# Patient Record
Sex: Male | Born: 1965 | Race: White | Hispanic: No | Marital: Married | Smoking: Never smoker
Health system: Southern US, Community
[De-identification: ages and names within clinical notes are randomized; demographics above are authoritative.]

## PROBLEM LIST (undated history)

## (undated) DIAGNOSIS — E119 Type 2 diabetes mellitus without complications: Secondary | ICD-10-CM

## (undated) DIAGNOSIS — E785 Hyperlipidemia, unspecified: Secondary | ICD-10-CM

## (undated) DIAGNOSIS — K760 Fatty (change of) liver, not elsewhere classified: Secondary | ICD-10-CM

## (undated) DIAGNOSIS — D649 Anemia, unspecified: Secondary | ICD-10-CM

## (undated) DIAGNOSIS — K922 Gastrointestinal hemorrhage, unspecified: Secondary | ICD-10-CM

## (undated) DIAGNOSIS — K7460 Unspecified cirrhosis of liver: Secondary | ICD-10-CM

## (undated) DIAGNOSIS — K219 Gastro-esophageal reflux disease without esophagitis: Secondary | ICD-10-CM

## (undated) DIAGNOSIS — M1990 Unspecified osteoarthritis, unspecified site: Secondary | ICD-10-CM

## (undated) DIAGNOSIS — R0600 Dyspnea, unspecified: Secondary | ICD-10-CM

## (undated) DIAGNOSIS — I10 Essential (primary) hypertension: Secondary | ICD-10-CM

## (undated) DIAGNOSIS — Z5189 Encounter for other specified aftercare: Secondary | ICD-10-CM

## (undated) HISTORY — DX: Unspecified osteoarthritis, unspecified site: M19.90

## (undated) HISTORY — DX: Encounter for other specified aftercare: Z51.89

## (undated) HISTORY — PX: OTHER SURGICAL HISTORY: SHX169

## (undated) HISTORY — DX: Hyperlipidemia, unspecified: E78.5

## (undated) HISTORY — DX: Gastro-esophageal reflux disease without esophagitis: K21.9

## (undated) HISTORY — PX: UPPER GASTROINTESTINAL ENDOSCOPY: SHX188

## (undated) HISTORY — PX: COLONOSCOPY: SHX174

## (undated) HISTORY — DX: Anemia, unspecified: D64.9

---

## 2009-06-25 ENCOUNTER — Emergency Department (HOSPITAL_COMMUNITY): Admission: EM | Admit: 2009-06-25 | Discharge: 2009-06-25 | Payer: Self-pay | Admitting: Emergency Medicine

## 2009-06-25 IMAGING — CR DG CHEST 2V
2 series · 2 of 2 positions shown · non-contrast
Comparison: None

CLINICAL DATA: Short of breath, hypertension, diabetes

CHEST - 2 VIEW

[w chest pa]
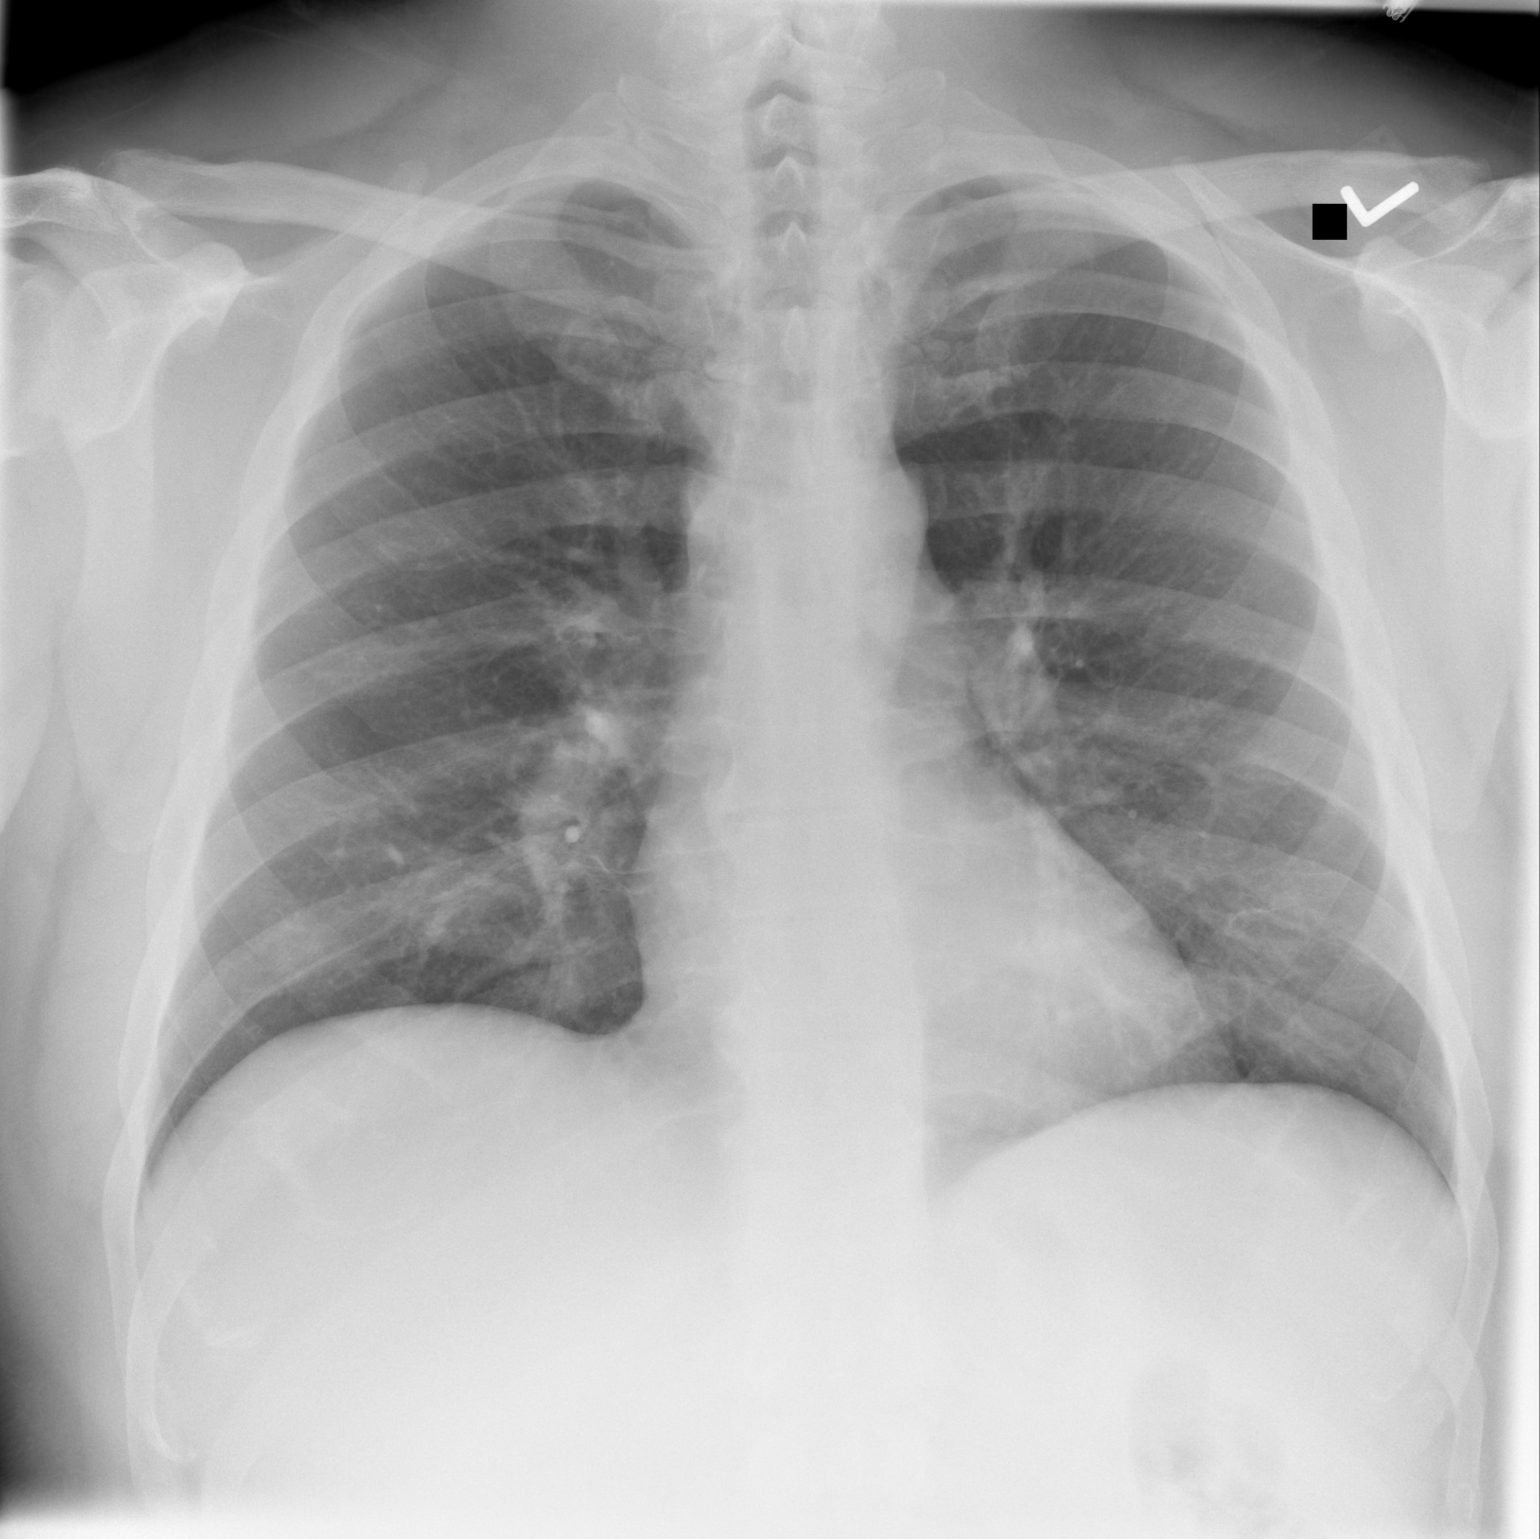

[w chest lat *]
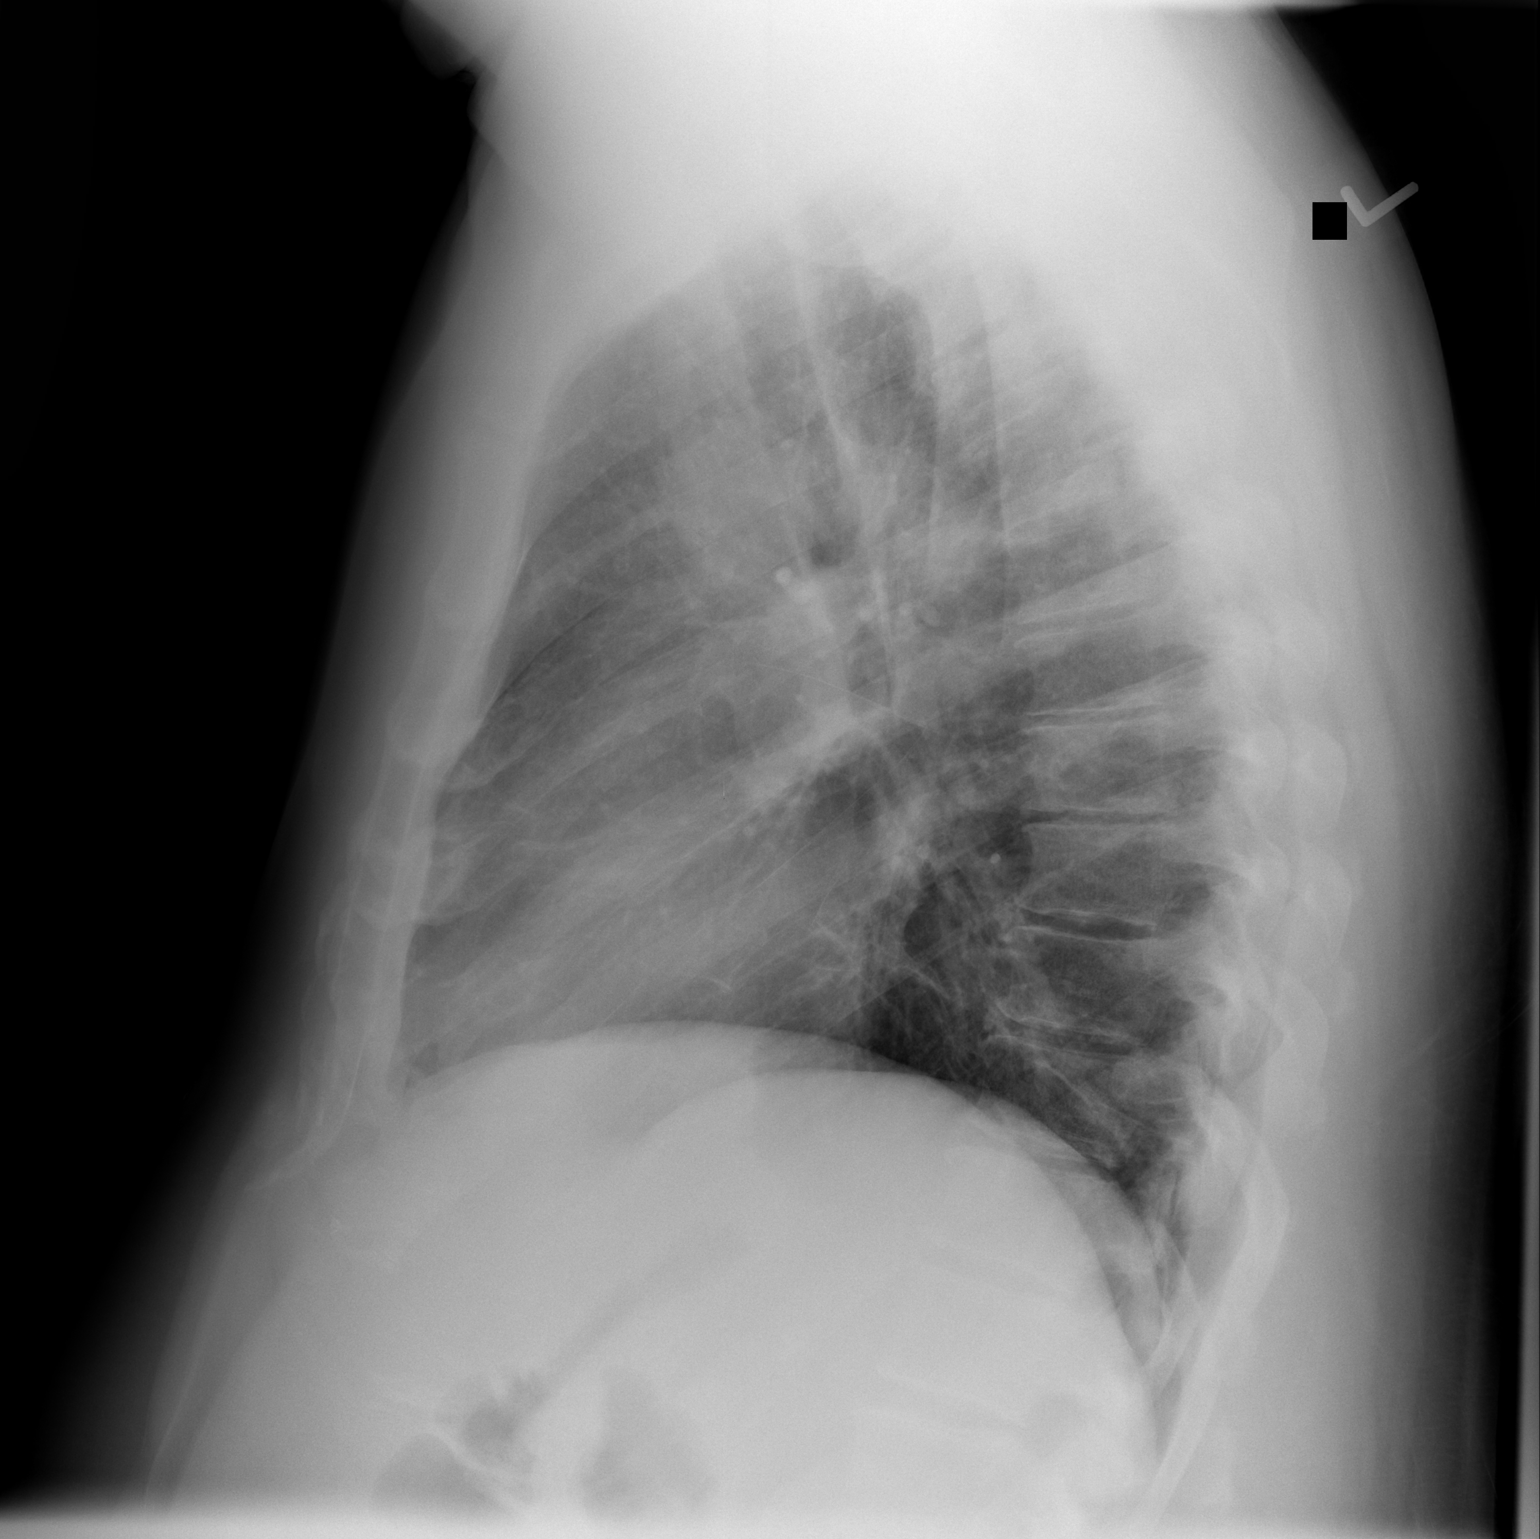

[2 of 2 positions shown; findings below may reference images not displayed]

FINDINGS: No active infiltrate or effusion is seen.  There is mild
peribronchial thickening present. The heart is within normal limits
in size. No bony abnormality is seen.
IMPRESSION: No active lung disease. Mild peribronchial thickening.

## 2017-09-14 DIAGNOSIS — D6959 Other secondary thrombocytopenia: Secondary | ICD-10-CM | POA: Diagnosis not present

## 2017-09-14 DIAGNOSIS — K7460 Unspecified cirrhosis of liver: Secondary | ICD-10-CM | POA: Diagnosis not present

## 2017-09-14 DIAGNOSIS — D732 Chronic congestive splenomegaly: Secondary | ICD-10-CM | POA: Diagnosis not present

## 2018-10-06 ENCOUNTER — Other Ambulatory Visit: Payer: Self-pay

## 2018-10-06 ENCOUNTER — Encounter (HOSPITAL_COMMUNITY): Payer: Self-pay | Admitting: Emergency Medicine

## 2018-10-06 ENCOUNTER — Inpatient Hospital Stay (HOSPITAL_COMMUNITY)
Admission: EM | Admit: 2018-10-06 | Discharge: 2018-10-10 | DRG: 432 | Disposition: A | Discharge status: Home / Self Care | Payer: BLUE CROSS/BLUE SHIELD | Attending: Internal Medicine | Admitting: Internal Medicine

## 2018-10-06 DIAGNOSIS — K7581 Nonalcoholic steatohepatitis (NASH): Secondary | ICD-10-CM | POA: Diagnosis present

## 2018-10-06 DIAGNOSIS — I8511 Secondary esophageal varices with bleeding: Secondary | ICD-10-CM | POA: Diagnosis present

## 2018-10-06 DIAGNOSIS — D72829 Elevated white blood cell count, unspecified: Secondary | ICD-10-CM | POA: Diagnosis present

## 2018-10-06 DIAGNOSIS — I10 Essential (primary) hypertension: Secondary | ICD-10-CM | POA: Diagnosis present

## 2018-10-06 DIAGNOSIS — E669 Obesity, unspecified: Secondary | ICD-10-CM | POA: Diagnosis present

## 2018-10-06 DIAGNOSIS — K3189 Other diseases of stomach and duodenum: Secondary | ICD-10-CM | POA: Diagnosis present

## 2018-10-06 DIAGNOSIS — K766 Portal hypertension: Secondary | ICD-10-CM | POA: Diagnosis present

## 2018-10-06 DIAGNOSIS — D6959 Other secondary thrombocytopenia: Secondary | ICD-10-CM | POA: Diagnosis present

## 2018-10-06 DIAGNOSIS — Z79899 Other long term (current) drug therapy: Secondary | ICD-10-CM | POA: Diagnosis not present

## 2018-10-06 DIAGNOSIS — D62 Acute posthemorrhagic anemia: Secondary | ICD-10-CM | POA: Diagnosis present

## 2018-10-06 DIAGNOSIS — I8500 Esophageal varices without bleeding: Secondary | ICD-10-CM

## 2018-10-06 DIAGNOSIS — Z7984 Long term (current) use of oral hypoglycemic drugs: Secondary | ICD-10-CM

## 2018-10-06 DIAGNOSIS — K7460 Unspecified cirrhosis of liver: Secondary | ICD-10-CM | POA: Diagnosis present

## 2018-10-06 DIAGNOSIS — Z833 Family history of diabetes mellitus: Secondary | ICD-10-CM | POA: Diagnosis not present

## 2018-10-06 DIAGNOSIS — D649 Anemia, unspecified: Secondary | ICD-10-CM

## 2018-10-06 DIAGNOSIS — E119 Type 2 diabetes mellitus without complications: Secondary | ICD-10-CM

## 2018-10-06 DIAGNOSIS — Z6833 Body mass index (BMI) 33.0-33.9, adult: Secondary | ICD-10-CM | POA: Diagnosis not present

## 2018-10-06 DIAGNOSIS — K921 Melena: Secondary | ICD-10-CM | POA: Diagnosis present

## 2018-10-06 DIAGNOSIS — K922 Gastrointestinal hemorrhage, unspecified: Secondary | ICD-10-CM | POA: Diagnosis present

## 2018-10-06 HISTORY — DX: Fatty (change of) liver, not elsewhere classified: K76.0

## 2018-10-06 HISTORY — DX: Unspecified cirrhosis of liver: K74.60

## 2018-10-06 HISTORY — DX: Essential (primary) hypertension: I10

## 2018-10-06 HISTORY — DX: Type 2 diabetes mellitus without complications: E11.9

## 2018-10-06 HISTORY — DX: Gastrointestinal hemorrhage, unspecified: K92.2

## 2018-10-06 LAB — COMPREHENSIVE METABOLIC PANEL
ALT: 45 U/L — ABNORMAL HIGH (ref 0–44)
AST: 42 U/L — ABNORMAL HIGH (ref 15–41)
Albumin: 3.7 g/dL (ref 3.5–5.0)
Alkaline Phosphatase: 54 U/L (ref 38–126)
Anion gap: 7 (ref 5–15)
BUN: 76 mg/dL — ABNORMAL HIGH (ref 6–20)
CO2: 23 mmol/L (ref 22–32)
Calcium: 9.3 mg/dL (ref 8.9–10.3)
Chloride: 105 mmol/L (ref 98–111)
Creatinine, Ser: 1.01 mg/dL (ref 0.61–1.24)
GFR calc Af Amer: >60 mL/min (ref >60)
GFR calc non Af Amer: >60 mL/min (ref >60)
Glucose, Bld: 180 mg/dL — ABNORMAL HIGH (ref 70–99)
Potassium: 5.2 mmol/L — ABNORMAL HIGH (ref 3.5–5.1)
Sodium: 135 mmol/L (ref 135–145)
Total Bilirubin: 1.4 mg/dL — ABNORMAL HIGH (ref 0.3–1.2)
Total Protein: 6.9 g/dL (ref 6.5–8.1)

## 2018-10-06 LAB — CBC
HCT: 32.1 % — ABNORMAL LOW (ref 39.0–52.0)
Hemoglobin: 10.9 g/dL — ABNORMAL LOW (ref 13.0–17.0)
MCH: 32.8 pg (ref 26.0–34.0)
MCHC: 34 g/dL (ref 30.0–36.0)
MCV: 96.7 fL (ref 80.0–100.0)
Platelets: 127 10*3/uL — ABNORMAL LOW (ref 150–400)
RBC: 3.32 MIL/uL — ABNORMAL LOW (ref 4.22–5.81)
RDW: 14 % (ref 11.5–15.5)
WBC: 11.5 10*3/uL — ABNORMAL HIGH (ref 4.0–10.5)
nRBC: 0 % (ref 0.0–0.2)

## 2018-10-06 LAB — TYPE AND SCREEN
ABO/RH(D): A POS
Antibody Screen: NEGATIVE

## 2018-10-06 LAB — GLUCOSE, CAPILLARY: Glucose-Capillary: 160 mg/dL — ABNORMAL HIGH (ref 70–99)

## 2018-10-06 LAB — ABO/RH: ABO/RH(D): A POS

## 2018-10-06 LAB — POC OCCULT BLOOD, ED: Fecal Occult Bld: POSITIVE — AB

## 2018-10-06 MED ORDER — SODIUM CHLORIDE 0.9 % IV BOLUS
1000.0000 mL | Freq: Once | INTRAVENOUS | Status: AC
  Administered 2018-10-06: 1000 mL via INTRAVENOUS

## 2018-10-06 MED ORDER — SODIUM CHLORIDE 0.9 % IV SOLN
INTRAVENOUS | Status: AC
  Administered 2018-10-07: via INTRAVENOUS

## 2018-10-06 MED ORDER — SODIUM CHLORIDE 0.9 % IV SOLN
8.0000 mg/h | INTRAVENOUS | Status: DC
  Administered 2018-10-06 – 2018-10-08 (×7): 8 mg/h via INTRAVENOUS
  Filled 2018-10-06 (×8): qty 80

## 2018-10-06 MED ORDER — SODIUM CHLORIDE 0.9 % IV SOLN
25.0000 ug/h | INTRAVENOUS | Status: DC
  Administered 2018-10-07 – 2018-10-08 (×4): 25 ug/h via INTRAVENOUS
  Filled 2018-10-06 (×5): qty 1

## 2018-10-06 MED ORDER — CIPROFLOXACIN IN D5W 400 MG/200ML IV SOLN
400.0000 mg | Freq: Two times a day (BID) | INTRAVENOUS | Status: DC
  Filled 2018-10-06: qty 200

## 2018-10-06 MED ORDER — MORPHINE SULFATE (PF) 2 MG/ML IV SOLN: 1.0000 mg | Freq: Four times a day (QID) | INTRAVENOUS | Status: DC | PRN

## 2018-10-06 MED ORDER — INSULIN ASPART 100 UNIT/ML ~~LOC~~ SOLN
0.0000 [IU] | SUBCUTANEOUS | Status: DC
  Administered 2018-10-06: 2 [IU] via SUBCUTANEOUS
  Administered 2018-10-07: 3 [IU] via SUBCUTANEOUS
  Administered 2018-10-07: 2 [IU] via SUBCUTANEOUS
  Administered 2018-10-08: 1 [IU] via SUBCUTANEOUS
  Administered 2018-10-08 (×2): 2 [IU] via SUBCUTANEOUS
  Administered 2018-10-08: 3 [IU] via SUBCUTANEOUS
  Administered 2018-10-08 – 2018-10-09 (×2): 1 [IU] via SUBCUTANEOUS
  Administered 2018-10-09: 2 [IU] via SUBCUTANEOUS
  Administered 2018-10-09: 1 [IU] via SUBCUTANEOUS
  Administered 2018-10-09: 5 [IU] via SUBCUTANEOUS
  Administered 2018-10-09 – 2018-10-10 (×3): 2 [IU] via SUBCUTANEOUS
  Administered 2018-10-10: 1 [IU] via SUBCUTANEOUS

## 2018-10-06 MED ORDER — ONDANSETRON HCL 4 MG/2ML IJ SOLN
4.0000 mg | Freq: Four times a day (QID) | INTRAMUSCULAR | Status: DC | PRN
  Administered 2018-10-07: 4 mg via INTRAVENOUS

## 2018-10-06 MED ORDER — ACETAMINOPHEN 650 MG RE SUPP: 650.0000 mg | Freq: Four times a day (QID) | RECTAL | Status: DC | PRN

## 2018-10-06 MED ORDER — ONDANSETRON HCL 4 MG PO TABS: 4.0000 mg | ORAL_TABLET | Freq: Four times a day (QID) | ORAL | Status: DC | PRN

## 2018-10-06 MED ORDER — ACETAMINOPHEN 325 MG PO TABS: 650.0000 mg | ORAL_TABLET | Freq: Four times a day (QID) | ORAL | Status: DC | PRN

## 2018-10-06 MED ORDER — SODIUM CHLORIDE 0.9 % IV SOLN
80.0000 mg | Freq: Once | INTRAVENOUS | Status: AC
  Administered 2018-10-06: 80 mg via INTRAVENOUS
  Filled 2018-10-06: qty 80

## 2018-10-06 MED ORDER — SODIUM CHLORIDE 0.9% FLUSH
3.0000 mL | Freq: Two times a day (BID) | INTRAVENOUS | Status: DC
  Administered 2018-10-06 – 2018-10-10 (×4): 3 mL via INTRAVENOUS

## 2018-10-06 MED ORDER — CIPROFLOXACIN IN D5W 400 MG/200ML IV SOLN
400.0000 mg | Freq: Once | INTRAVENOUS | Status: AC
  Administered 2018-10-06: 400 mg via INTRAVENOUS
  Filled 2018-10-06: qty 200

## 2018-10-06 MED ORDER — OCTREOTIDE LOAD VIA INFUSION
25.0000 ug | Freq: Once | INTRAVENOUS | Status: AC
  Administered 2018-10-07: 25 ug via INTRAVENOUS
  Filled 2018-10-06: qty 13

## 2018-10-06 NOTE — ED Triage Notes (Signed)
Started vomitng and dark stools started yesterday evening, has hx of GI Bleed, does not have a reg dr here.

## 2018-10-06 NOTE — ED Provider Notes (Signed)
[facility] EMERGENCY DEPARTMENT Provider Note   CSN: [id number] Arrival date & time: 10/06/18  1407     History   Chief Complaint Chief Complaint  Patient presents with  . GI Bleeding    HPI Martin Strong is a 52 y.o. male. 52 year old male, with PMH of upper GI bleeds, esophageal varices, diabetes, liver cirrhosis, presents with a 1 day history of hematemesis.  Patient states symptoms started suddenly yesterday with nausea and vomiting.  He noted approximately 1 cup of blood in his vomit and vomited 5 times yesterday.  He also states he has had black stools today.  He also states he is feeling lightheaded today.  He denies any associated abdominal pain, diarrhea.  He has not followed up with a GI doctor in a year and a half.  He previously received his GI care in [place].  He denies being on any blood thinners.  Patient denies any alcohol intake.  The history is provided by the patient.    Past Medical History:  Diagnosis Date  . Cirrhosis ([place])   . Diabetes mellitus without complication ([place])   . Fatty liver   . GI bleed   . Hypertension     There are no active problems to display for this patient.   Past Surgical History:  Procedure Laterality Date  . esophageal bands          Home Medications    Prior to Admission medications   Not on File    Family History No family history on file.  Social History Social History   Tobacco Use  . Smoking status: Never Smoker  . Smokeless tobacco: Never Used  Substance Use Topics  . Alcohol use: Never    Frequency: Never  . Drug use: Never     Allergies   Patient has no allergy information on record.   Review of Systems Review of Systems  Constitutional: Negative for chills and fever.  HENT: Negative for rhinorrhea and sore throat.   Eyes: Negative for visual disturbance.  Respiratory: Negative for cough and shortness of breath.   Cardiovascular: Negative for chest pain and leg  swelling.  Gastrointestinal: Positive for blood in stool (black stool), nausea and vomiting. Negative for abdominal pain and diarrhea.  Genitourinary: Negative for dysuria, frequency and urgency.  Musculoskeletal: Negative for joint swelling and neck pain.  Skin: Negative for rash and wound.  Neurological: Positive for light-headedness. Negative for syncope and numbness.  All other systems reviewed and are negative.    Physical Exam Updated Vital Signs BP 107/72 (BP Location: Left Arm)   Pulse (!) 125   Temp 98.9 F (37.2 C) (Oral)   Resp 18   Ht 6' (1.829 m)   Wt 110.7 kg   SpO2 100%   BMI 33.09 kg/m   Physical Exam  Constitutional: He is oriented to person, place, and time. He appears well-developed and well-nourished.  HENT:  Head: Normocephalic and atraumatic.  Eyes: Conjunctivae and EOM are normal.  Neck: Neck supple.  Cardiovascular: Regular rhythm and normal heart sounds. Tachycardia present.  No murmur heard. Pulmonary/Chest: Effort normal and breath sounds normal. No respiratory distress. He has no wheezes. He has no rales.  Abdominal: Soft. Bowel sounds are normal. He exhibits no distension. There is no tenderness.  Genitourinary: Rectum normal.  Genitourinary Comments: Black tarry stool.  Chaperone present during exam  Musculoskeletal: Normal range of motion. He exhibits no tenderness or deformity.  Neurological: He is alert and oriented  to person, place, and time.  Skin: Skin is warm and dry. No rash noted. No erythema.  Psychiatric: He has a normal mood and affect. His behavior is normal.  Nursing note and vitals reviewed.    ED Treatments / Results  Labs (all labs ordered are listed, but only abnormal results are displayed) Labs Reviewed  COMPREHENSIVE METABOLIC PANEL - Abnormal; Notable for the following components:      Result Value   Potassium 5.2 (*)    Glucose, Bld 180 (*)    BUN 76 (*)    AST 42 (*)    ALT 45 (*)    Total Bilirubin 1.4 (*)     All other components within normal limits  CBC - Abnormal; Notable for the following components:   WBC 11.5 (*)    RBC 3.32 (*)    Hemoglobin 10.9 (*)    HCT 32.1 (*)    Platelets 127 (*)    All other components within normal limits  POC OCCULT BLOOD, ED  TYPE AND SCREEN  ABO/RH    EKG EKG Interpretation  Date/Time:  Thursday October 06 2018 14:54:56 EDT Ventricular Rate:  129 PR Interval:  146 QRS Duration: 82 QT Interval:  302 QTC Calculation: 442 R Axis:   26 Text Interpretation:  Sinus tachycardia Possible Anterior infarct , age undetermined T wave abnormality, consider inferior ischemia Abnormal ECG Since last tracing rate faster Confirmed by Isla Pence [phone]) on 10/06/2018 5:51:36 PM   Radiology No results found.  Procedures .Critical Care Performed by: Etter Sjogren, PA-C Authorized by: Etter Sjogren, PA-C   Critical care provider statement:    Critical care time (minutes):  45   Critical care was necessary to treat or prevent imminent or life-threatening deterioration of the following conditions:  Circulatory failure, dehydration and shock   Critical care was time spent personally by me on the following activities:  Discussions with consultants, evaluation of patient's response to treatment, examination of patient, ordering and performing treatments and interventions, ordering and review of laboratory studies, ordering and review of radiographic studies, pulse oximetry, re-evaluation of patient's condition, obtaining history from patient or surrogate and review of old charts   (including critical care time)  Medications Ordered in ED Medications  pantoprazole (PROTONIX) 80 mg in sodium chloride 0.9 % 100 mL IVPB (has no administration in time range)  pantoprazole (PROTONIX) 80 mg in sodium chloride 0.9 % 250 mL (0.32 mg/mL) infusion (has no administration in time range)  sodium chloride 0.9 % bolus 1,000 mL (has no administration in time range)       Initial Impression / Assessment and Plan / ED Course  I have reviewed the triage vital signs and the nursing notes.  Pertinent labs & imaging results that were available during my care of the patient were reviewed by me and considered in my medical decision making (see chart for details).     Patient tachycardic, otherwise vital signs stable.  Patient no acute distress, resting comfortably in bed.  Hemoglobin is low at 10.9 BUN is elevated.  Patient has black stool on physical exam.  History and physical most consistent with upper GI bleed.  Will consult GI.  Spoke with Dr. Lyndel Safe with GI.  He recommends admit to the hospitalist, n.p.o. except for ice chips.  Recommends Cipro and octreotide.  His plan is for an EGD in the morning and thus there is an acute change in EGD is earlier.  Re-evaluated patient multiple times, patient remains  tachycardic.  However he is resting comfortably in bed, blood pressure is stable.  No hematemesis while back in his room.   Spoke with Dr. Myna Hidalgo who will see and admit the pt.  Critical care was performed on this patient due to patient being critically ill and at risk for life-threatening endorgan damage.  Frequent re-evaluations, monitor vital signs and cardiac monitor, called consultants, reviewed EMR. Final Clinical Impressions(s) / ED Diagnoses   Final diagnoses:  None    ED Discharge Orders    None       Etter Sjogren, PA-C 10/07/18 0156    Isla Pence, MD 10/08/18 1050

## 2018-10-06 NOTE — H&P (Addendum)
History and Physical    Martin Strong VFI:[id number] DOB: 12/21/1965 DOA: 10/06/2018  PCP: No primary care provider on file.   Patient coming from: Home   Chief Complaint: Melena, hematemesis  HPI: Martin Strong is a 52 y.o. 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  Patient reports that he noted black tarry stools yesterday evening and later had an episode of hematemesis.  He had another episode of hematemesis this morning and began to feel lightheaded.  He denies any abdominal pain, fevers, chills, chest pain, shortness breath, or cough.  He reports history of upper GI bleed 3 years ago, states that he underwent upper endoscopy at that time with banding of esophageal varices.  He went on to follow with GI and had repeat endoscopies, but not in the past year or so.  ED Course: Upon arrival to the ED, patient is found to be afebrile, saturating low 90s on room air, tachycardic in the 120s, and with stable blood pressure.  EKG features sinus tachycardia with rate 129.  Chemistry panel is notable for potassium of 5.2 and BUN of 76.  There is slight elevation in transaminases and bilirubin.  CBC is notable for mild leukocytosis, slight thrombocytopenia, and normocytic anemia with hemoglobin 10.9.  Fecal occult blood testing was positive.  Gastroenterology was consulted by the ED physician and recommended medical admission with the patient to be n.p.o., and also recommending octreotide and ciprofloxacin in addition to the PPI infusion.  Type and screen was performed, a liter of normal saline was given, and the patient will be admitted to the stepdown unit for ongoing evaluation and management.  Review of Systems:  All other systems reviewed and apart from HPI, are negative.  Past Medical History:  Diagnosis  Date  . Cirrhosis ([place])   . Diabetes mellitus without complication ([place])   . Fatty liver   . GI bleed   . Hypertension     Past Surgical History:  Procedure Laterality Date  . esophageal bands       reports that he has never smoked. He has never used smokeless tobacco. He reports that he does not drink alcohol or use drugs.  Allergies  Allergen Reactions  . Nadolol Nausea Only    Family History  Problem Relation Age of Onset  . Diabetes Other      Prior to Admission medications   Medication Sig Start Date End Date Taking? Authorizing Provider  cholecalciferol (VITAMIN D) 1000 units tablet Take 1,000 Units by mouth 2 (two) times daily.   Yes [provider]  Ferrous Sulfate (IRON) 325 (65 Fe) MG TABS Take 325 mg by mouth 2 (two) times daily.   Yes [provider]  FLAX OIL-FISH OIL-BORAGE OIL PO Take 1 tablet by mouth daily.   Yes [provider]  glipiZIDE (GLUCOTROL) 10 MG tablet Take 10 mg by mouth 2 (two) times daily.   Yes [provider]  lisinopril (PRINIVIL,ZESTRIL) 20 MG tablet Take 20 mg by mouth 2 (two) times daily.   Yes [provider]  metFORMIN (GLUCOPHAGE) 1000 MG tablet Take 1,000 mg by mouth 2 (two) times daily with a meal.   Yes [provider]  PROPRANOLOL HCL PO Take 1 tablet by mouth 2 (two) times daily.   Yes [provider]    Physical Exam: Vitals:   10/06/18  1602 10/06/18 1800 10/06/18 1815 10/06/18 1845  BP: 107/72 111/81 112/69 128/69  Pulse: (!) 125 (!) 112 (!) 128 (!) 124  Resp: 18 13 19 14   Temp: 98.9 F (37.2 C)     TempSrc: Oral     SpO2: 100% 100% 99% 99%  Weight:      Height:        Constitutional: NAD, calm  Eyes: PERTLA, lids and conjunctivae normal ENMT: Mucous membranes are moist. Posterior pharynx clear of any exudate or lesions.   Neck: normal, supple, no masses, no thyromegaly Respiratory: clear to auscultation bilaterally, no wheezing, no crackles. Normal  respiratory effort.   Cardiovascular: Rate ~120 and regular. No extremity edema.   Abdomen: Soft. Nontender. Bowel sounds active.  Musculoskeletal: no clubbing / cyanosis. No joint deformity upper and lower extremities.   Skin: no significant rashes, lesions, ulcers. Warm, dry, well-perfused. Neurologic: No facial asymmetry. Sensation intact. Moving all extremities.  Psychiatric: Alert and oriented x 3. Calm, cooperative.    Labs on Admission: I have personally reviewed following labs and imaging studies  CBC: Recent Labs  Lab 10/06/18 1517  WBC 11.5*  HGB 10.9*  HCT 32.1*  MCV 96.7  PLT 388*   Basic Metabolic Panel: Recent Labs  Lab 10/06/18 1517  NA 135  K 5.2*  CL 105  CO2 23  GLUCOSE 180*  BUN 76*  CREATININE 1.01  CALCIUM 9.3   GFR: Estimated Creatinine Clearance: 112.4 mL/min (by C-G formula based on SCr of 1.01 mg/dL). Liver Function Tests: Recent Labs  Lab 10/06/18 1517  AST 42*  ALT 45*  ALKPHOS 54  BILITOT 1.4*  PROT 6.9  ALBUMIN 3.7   No results for input(s): LIPASE, AMYLASE in the last 168 hours. No results for input(s): AMMONIA in the last 168 hours. Coagulation Profile: No results for input(s): INR, PROTIME in the last 168 hours. Cardiac Enzymes: No results for input(s): CKTOTAL, CKMB, CKMBINDEX, TROPONINI in the last 168 hours. BNP (last 3 results) No results for input(s): PROBNP in the last 8760 hours. HbA1C: No results for input(s): HGBA1C in the last 72 hours. CBG: No results for input(s): GLUCAP in the last 168 hours. Lipid Profile: No results for input(s): CHOL, HDL, LDLCALC, TRIG, CHOLHDL, LDLDIRECT in the last 72 hours. Thyroid Function Tests: No results for input(s): TSH, T4TOTAL, FREET4, T3FREE, THYROIDAB in the last 72 hours. Anemia Panel: No results for input(s): VITAMINB12, FOLATE, FERRITIN, TIBC, IRON, RETICCTPCT in the last 72 hours. Urine analysis: No results found for: COLORURINE, APPEARANCEUR, LABSPEC, PHURINE,  GLUCOSEU, HGBUR, BILIRUBINUR, KETONESUR, PROTEINUR, UROBILINOGEN, NITRITE, LEUKOCYTESUR Sepsis Labs: @LABRCNTIP (procalcitonin:4,lacticidven:4) )No results found for this or any previous visit (from the past 240 hour(s)).   Radiological Exams on Admission: No results found.  EKG: Independently reviewed. Sinus tachycardia (rate 129).   Assessment/Plan   1. Acute upper GI bleed; hx of cirrhosis with varices  - Pt reports hx of non-alcoholic cirrhosis with esophageal varices that were banded ~3 yrs ago, now p/w melena, hematemesis, and lightheadedness without syncope  - He is tachycardic in ED with normal BP and Hgb of 10.9 (no priors available)  - GI consulting and much appreciated  - Type and screen, continue NPO, fluid-resuscitate, continue IV Protonix and octreotide infusions, ppx ciprofloxacin, trend H&H    2. Hypertension - BP normal  - Antihypertensives held in light of acute UGIB    3. Type II DM  - No A1c on file  - Managed at home with glipizide and metformin, held  on admission  - Check CBG's and use a low-intensity SSI with Novolog     DVT prophylaxis: SCD's  Code Status: Full   Family Communication: Family updated at bedside  Consults called: GI Admission status: Inpatient     Vianne Bulls, MD [facility] Pager [phone]  If 7PM-7AM, please contact night-coverage [email or web address] Password [email or web address]  10/06/2018, 8:02 PM

## 2018-10-07 ENCOUNTER — Encounter (HOSPITAL_COMMUNITY)
Admission: EM | Disposition: A | Discharge status: Home / Self Care | Payer: Self-pay | Source: Home / Self Care | Attending: Internal Medicine

## 2018-10-07 ENCOUNTER — Encounter (HOSPITAL_COMMUNITY): Payer: Self-pay | Admitting: *Deleted

## 2018-10-07 ENCOUNTER — Inpatient Hospital Stay (HOSPITAL_COMMUNITY): Payer: BLUE CROSS/BLUE SHIELD | Admitting: Anesthesiology

## 2018-10-07 DIAGNOSIS — K3189 Other diseases of stomach and duodenum: Secondary | ICD-10-CM

## 2018-10-07 DIAGNOSIS — D62 Acute posthemorrhagic anemia: Secondary | ICD-10-CM

## 2018-10-07 DIAGNOSIS — I8500 Esophageal varices without bleeding: Secondary | ICD-10-CM

## 2018-10-07 DIAGNOSIS — K766 Portal hypertension: Secondary | ICD-10-CM

## 2018-10-07 HISTORY — PX: ESOPHAGOGASTRODUODENOSCOPY (EGD) WITH PROPOFOL: SHX5813

## 2018-10-07 HISTORY — PX: ESOPHAGEAL BANDING: SHX5518

## 2018-10-07 LAB — COMPREHENSIVE METABOLIC PANEL
ALT: 40 U/L (ref 0–44)
AST: 40 U/L (ref 15–41)
Albumin: 3 g/dL — ABNORMAL LOW (ref 3.5–5.0)
Alkaline Phosphatase: 41 U/L (ref 38–126)
Anion gap: 7 (ref 5–15)
BUN: 56 mg/dL — ABNORMAL HIGH (ref 6–20)
CO2: 21 mmol/L — ABNORMAL LOW (ref 22–32)
Calcium: 8.5 mg/dL — ABNORMAL LOW (ref 8.9–10.3)
Chloride: 110 mmol/L (ref 98–111)
Creatinine, Ser: 0.89 mg/dL (ref 0.61–1.24)
GFR calc Af Amer: >60 mL/min (ref >60)
GFR calc non Af Amer: >60 mL/min (ref >60)
Glucose, Bld: 108 mg/dL — ABNORMAL HIGH (ref 70–99)
Potassium: 3.4 mmol/L — ABNORMAL LOW (ref 3.5–5.1)
Sodium: 138 mmol/L (ref 135–145)
Total Bilirubin: 1.1 mg/dL (ref 0.3–1.2)
Total Protein: 5.3 g/dL — ABNORMAL LOW (ref 6.5–8.1)

## 2018-10-07 LAB — GLUCOSE, CAPILLARY
Glucose-Capillary: 100 mg/dL — ABNORMAL HIGH (ref 70–99)
Glucose-Capillary: 105 mg/dL — ABNORMAL HIGH (ref 70–99)
Glucose-Capillary: 119 mg/dL — ABNORMAL HIGH (ref 70–99)
Glucose-Capillary: 155 mg/dL — ABNORMAL HIGH (ref 70–99)
Glucose-Capillary: 229 mg/dL — ABNORMAL HIGH (ref 70–99)
Glucose-Capillary: 83 mg/dL (ref 70–99)

## 2018-10-07 LAB — PROTIME-INR
INR: 1.34
Prothrombin Time: 16.5 seconds — ABNORMAL HIGH (ref 11.4–15.2)

## 2018-10-07 LAB — HEMOGLOBIN
Hemoglobin: 8.6 g/dL — ABNORMAL LOW (ref 13.0–17.0)
Hemoglobin: 8.3 g/dL — ABNORMAL LOW (ref 13.0–17.0)

## 2018-10-07 LAB — CBC
HCT: 25 % — ABNORMAL LOW (ref 39.0–52.0)
Hemoglobin: 8.2 g/dL — ABNORMAL LOW (ref 13.0–17.0)
MCH: 31.8 pg (ref 26.0–34.0)
MCHC: 32.8 g/dL (ref 30.0–36.0)
MCV: 96.9 fL (ref 80.0–100.0)
Platelets: 56 10*3/uL — ABNORMAL LOW (ref 150–400)
RBC: 2.58 MIL/uL — ABNORMAL LOW (ref 4.22–5.81)
RDW: 14 % (ref 11.5–15.5)
WBC: 6.1 10*3/uL (ref 4.0–10.5)
nRBC: 0 % (ref 0.0–0.2)

## 2018-10-07 LAB — HEMATOCRIT
HCT: 24.7 % — ABNORMAL LOW (ref 39.0–52.0)
HCT: 25 % — ABNORMAL LOW (ref 39.0–52.0)

## 2018-10-07 LAB — HIV ANTIBODY (ROUTINE TESTING W REFLEX): HIV Screen 4th Generation wRfx: NONREACTIVE

## 2018-10-07 LAB — HEMOGLOBIN AND HEMATOCRIT, BLOOD
HCT: 28.1 % — ABNORMAL LOW (ref 39.0–52.0)
Hemoglobin: 8.9 g/dL — ABNORMAL LOW (ref 13.0–17.0)

## 2018-10-07 SURGERY — ESOPHAGOGASTRODUODENOSCOPY (EGD) WITH PROPOFOL
Anesthesia: Monitor Anesthesia Care

## 2018-10-07 MED ORDER — SODIUM CHLORIDE 0.9 % IV SOLN
2.0000 g | INTRAVENOUS | Status: DC
  Administered 2018-10-07 – 2018-10-10 (×4): 2 g via INTRAVENOUS
  Filled 2018-10-07 (×4): qty 20

## 2018-10-07 MED ORDER — LIDOCAINE 2% (20 MG/ML) 5 ML SYRINGE
INTRAMUSCULAR | Status: DC | PRN
  Administered 2018-10-07: 100 mg via INTRAVENOUS

## 2018-10-07 MED ORDER — POTASSIUM CHLORIDE 10 MEQ/100ML IV SOLN
10.0000 meq | INTRAVENOUS | Status: AC
  Administered 2018-10-07 (×2): 10 meq via INTRAVENOUS
  Filled 2018-10-07 (×2): qty 100

## 2018-10-07 MED ORDER — PROPOFOL 500 MG/50ML IV EMUL
INTRAVENOUS | Status: DC | PRN
  Administered 2018-10-07: 125 ug/kg/min via INTRAVENOUS

## 2018-10-07 MED ORDER — ONDANSETRON HCL 4 MG/2ML IJ SOLN
INTRAMUSCULAR | Status: AC
  Filled 2018-10-07: qty 2

## 2018-10-07 MED ORDER — LACTATED RINGERS IV SOLN
INTRAVENOUS | Status: DC | PRN
  Administered 2018-10-07: 12:00:00 via INTRAVENOUS

## 2018-10-07 MED ORDER — PROPOFOL 10 MG/ML IV BOLUS
INTRAVENOUS | Status: DC | PRN
  Administered 2018-10-07 (×2): 50 mg via INTRAVENOUS

## 2018-10-07 SURGICAL SUPPLY — 15 items

## 2018-10-07 NOTE — Transfer of Care (Signed)
Immediate Anesthesia Transfer of Care Note  Patient: Martin Strong  Procedure(s) Performed: ESOPHAGOGASTRODUODENOSCOPY (EGD) WITH PROPOFOL (N/A )  Patient Location: PACU and Endoscopy Unit  Anesthesia Type:MAC  Level of Consciousness: awake, drowsy and patient cooperative  Airway & Oxygen Therapy: Patient Spontanous Breathing and Patient connected to nasal cannula oxygen  Post-op Assessment: Report given to RN and Post -op Vital signs reviewed and stable  Post vital signs: Reviewed and stable  Last Vitals:  Vitals Value Taken Time  BP    Temp    Pulse    Resp    SpO2      Last Pain:  Vitals:   10/07/18 1144  TempSrc: Oral  PainSc: 0-No pain         Complications: No apparent anesthesia complications

## 2018-10-07 NOTE — Anesthesia Preprocedure Evaluation (Addendum)
Anesthesia Evaluation  Patient identified by MRN, date of birth, ID band Patient awake    Reviewed: Allergy & Precautions, NPO status , Patient's Chart, lab work & pertinent test results, reviewed documented beta blocker date and time   Airway Mallampati: II  TM Distance: >3 FB Neck ROM: Full    Dental no notable dental hx. (+) Missing,    Pulmonary neg pulmonary ROS,    Pulmonary exam normal breath sounds clear to auscultation       Cardiovascular hypertension, Pt. on home beta blockers and Pt. on medications Normal cardiovascular exam Rhythm:Regular Rate:Normal     Neuro/Psych negative neurological ROS  negative psych ROS   GI/Hepatic (+) Cirrhosis   Esophageal Varices    , Hematemasis UGI bleed   Endo/Other  diabetes, Well Controlled, Type 2, Oral Hypoglycemic AgentsObesity  Renal/GU   negative genitourinary   Musculoskeletal negative musculoskeletal ROS (+)   Abdominal (+) + obese,   Peds  Hematology negative hematology ROS (+)   Anesthesia Other Findings   Reproductive/Obstetrics                            Anesthesia Physical Anesthesia Plan  ASA: III  Anesthesia Plan: MAC   Post-op Pain Management:    Induction:   PONV Risk Score and Plan: 1 and Propofol infusion and Ondansetron  Airway Management Planned: Natural Airway and Nasal Cannula  Additional Equipment:   Intra-op Plan:   Post-operative Plan:   Informed Consent: I have reviewed the patients History and Physical, chart, labs and discussed the procedure including the risks, benefits and alternatives for the proposed anesthesia with the patient or authorized representative who has indicated his/her understanding and acceptance.   Dental advisory given  Plan Discussed with: CRNA and Surgeon  Anesthesia Plan Comments:         Anesthesia Quick Evaluation

## 2018-10-07 NOTE — Progress Notes (Signed)
PROGRESS NOTE        PATIENT DETAILS Name: Martin Strong Age: 52 y.o. Sex: male Date of Birth: 1966/07/17 Admit Date: 10/06/2018 Admitting Physician Vianne Bulls, MD CXK:GYJE, Ronn Melena, NP  Brief Narrative: Patient is a 52 y.o. male prior history of esophageal varices, cirrhosis secondary to NASH, DM-2, hypertension-presented with melena and hematemesis-with resultant acute blood loss anemia-patient was transfused 1 unit of PRBC, started on PPI and octreotide infusion-admitted to the hospitalist service.  For EGD later today.  GI following.  See below for further details.  Subjective: No hematochezia, melena or hematemesis overnight.  Denies any abdominal pain.  Assessment/Plan: Upper GI bleeding with acute blood loss anemia: Highly suspicious for variceal bleeding-remains on PPI and octreotide infusion.  Have started prophylactic IV Rocephin this morning.  Hemoglobin tenuous but stable at 8.2 this morning.  GI planning for endoscopic evaluation later today-we will repeat CBC later this afternoon.  If significant drop or if hematochezia/melena recur-may need to be transfused.  Follow closely.  Thrombocytopenia: Follow for now-repeating CBC-if significant drop in platelet count-may need platelet transfusion.  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  Liver cirrhosis: Secondary to [place]-has thrombocytopenia-known esophageal varices.  Did not feel any significant ascites on my exam.  Does not appear to be on diuretics in the outpatient setting.  Once bleeding has resolved and blood pressure is more stable-propranolol can be resumed.  Hypertension: Blood pressure stable with soft-continue to hold all antihypertensives  DM-2: CBG stable with SSI-hold all oral hypoglycemics.  DVT Prophylaxis: SCD's  Code Status: Full code   Family Communication: None at bedside  Disposition Plan: Remain inpatient not stable for discharge at  this point.  Antimicrobial agents: Anti-infectives (From admission, onward)   Start     Dose/Rate Route Frequency Ordered Stop   10/07/18 1000  ciprofloxacin (CIPRO) IVPB 400 mg  Status:  Discontinued     400 mg 200 mL/hr over 60 Minutes Intravenous Every 12 hours 10/06/18 1955 10/07/18 0922   10/07/18 1000  [MAR Hold]  cefTRIAXone (ROCEPHIN) 2 g in sodium chloride 0.9 % 100 mL IVPB     (MAR Hold since Fri 10/07/2018 at 1143. Reason: Transfer to a Procedural area.)   2 g 200 mL/hr over 30 Minutes Intravenous Every 24 hours 10/07/18 0922     10/06/18 1845  ciprofloxacin (CIPRO) IVPB 400 mg     400 mg 200 mL/hr over 60 Minutes Intravenous  Once 10/06/18 1838 10/06/18 2213      Procedures: None  CONSULTS:  GI  Time spent: 25 minutes-Greater than 50% of this time was spent in counseling, explanation of diagnosis, planning of further management, and coordination of care.  MEDICATIONS: Scheduled Meds: . [MAR Hold] insulin aspart  0-9 Units Subcutaneous Q4H  . [MAR Hold] sodium chloride flush  3 mL Intravenous Q12H   Continuous Infusions: . [MAR Hold] cefTRIAXone (ROCEPHIN)  IV 2 g (10/07/18 1032)  . octreotide  (SANDOSTATIN)    IV infusion 25 mcg/hr (10/07/18 1032)  . pantoprozole (PROTONIX) infusion 8 mg/hr (10/07/18 1032)   PRN Meds:.[MAR Hold] acetaminophen **OR** [MAR Hold] acetaminophen, [MAR Hold]  morphine injection, [MAR Hold] ondansetron **OR** [MAR Hold] ondansetron (ZOFRAN) IV   PHYSICAL EXAM: Vital signs: Vitals:   10/06/18 2055 10/07/18 0429 10/07/18 0441 10/07/18 1144  BP: 116/63  92/63 113/75  Pulse: (!) 120  Marland Kitchen)  113 (!) 108  Resp: 18  18 11   Temp: 98 F (36.7 C)  98.2 F (36.8 C) 97.8 F (36.6 C)  TempSrc: Oral  Oral Oral  SpO2: 99%  95% 98%  Weight:  113.6 kg    Height:       Filed Weights   10/06/18 1443 10/07/18 0429  Weight: 110.7 kg 113.6 kg   Body mass index is 33.97 kg/m.   General appearance :Awake, alert, not in any distress.  Eyes:,  pupils equally reactive to light and accomodation,no scleral icterus. HEENT: Atraumatic and Normocephalic Neck: supple, no JVD.  Resp:Good air entry bilaterally, no added sounds  CVS: S1 S2 regular, no murmurs.  GI: Bowel sounds present, Non tender and not distended with no gaurding, rigidity or rebound. Extremities: B/L Lower Ext shows no edema, both legs are warm to touch Neurology:  speech clear,Non focal, sensation is grossly intact. Psychiatric: Normal judgment and insight. Alert and oriented x 3. Normal mood. Musculoskeletal:No digital cyanosis Skin:No Rash, warm and dry Wounds:N/A  I have personally reviewed following labs and imaging studies  LABORATORY DATA: CBC: Recent Labs  Lab 10/06/18 1517 10/07/18 0019 10/07/18 0740  WBC 11.5*  --  6.1  HGB 10.9* 8.6* 8.2*  8.3*  HCT 32.1* 24.7* 25.0*  25.0*  MCV 96.7  --  96.9  PLT 127*  --  56*    Basic Metabolic Panel: Recent Labs  Lab 10/06/18 1517 10/07/18 0019  NA 135 138  K 5.2* 3.4*  CL 105 110  CO2 23 21*  GLUCOSE 180* 108*  BUN 76* 56*  CREATININE 1.01 0.89  CALCIUM 9.3 8.5*    GFR: Estimated Creatinine Clearance: 129.2 mL/min (by C-G formula based on SCr of 0.89 mg/dL).  Liver Function Tests: Recent Labs  Lab 10/06/18 1517 10/07/18 0019  AST 42* 40  ALT 45* 40  ALKPHOS 54 41  BILITOT 1.4* 1.1  PROT 6.9 5.3*  ALBUMIN 3.7 3.0*   No results for input(s): LIPASE, AMYLASE in the last 168 hours. No results for input(s): AMMONIA in the last 168 hours.  Coagulation Profile: Recent Labs  Lab 10/07/18 0740  INR 1.34    Cardiac Enzymes: No results for input(s): CKTOTAL, CKMB, CKMBINDEX, TROPONINI in the last 168 hours.  BNP (last 3 results) No results for input(s): PROBNP in the last 8760 hours.  HbA1C: No results for input(s): HGBA1C in the last 72 hours.  CBG: Recent Labs  Lab 10/06/18 2146 10/07/18 0010 10/07/18 0423 10/07/18 0755  GLUCAP 160* 100* 83 105*    Lipid Profile: No  results for input(s): CHOL, HDL, LDLCALC, TRIG, CHOLHDL, LDLDIRECT in the last 72 hours.  Thyroid Function Tests: No results for input(s): TSH, T4TOTAL, FREET4, T3FREE, THYROIDAB in the last 72 hours.  Anemia Panel: No results for input(s): VITAMINB12, FOLATE, FERRITIN, TIBC, IRON, RETICCTPCT in the last 72 hours.  Urine analysis: No results found for: COLORURINE, APPEARANCEUR, LABSPEC, PHURINE, GLUCOSEU, HGBUR, BILIRUBINUR, KETONESUR, PROTEINUR, UROBILINOGEN, NITRITE, LEUKOCYTESUR  Sepsis Labs: Lactic Acid, Venous No results found for: LATICACIDVEN  MICROBIOLOGY: No results found for this or any previous visit (from the past 240 hour(s)).  RADIOLOGY STUDIES/RESULTS: No results found.   LOS: 1 day   Oren Binet, MD  [facility]  If 7PM-7AM, please contact night-coverage  Please page via [email or web address]-Password [email or web address]-click on MD name and type text message  10/07/2018, 11:51 AM

## 2018-10-07 NOTE — Anesthesia Postprocedure Evaluation (Signed)
Anesthesia Post Note  Patient: Martin Strong  Procedure(s) Performed: ESOPHAGOGASTRODUODENOSCOPY (EGD) WITH PROPOFOL (N/A )     Patient location during evaluation: PACU Anesthesia Type: MAC Level of consciousness: awake and alert and oriented Pain management: pain level controlled Vital Signs Assessment: post-procedure vital signs reviewed and stable Respiratory status: spontaneous breathing, nonlabored ventilation and respiratory function stable Cardiovascular status: stable and blood pressure returned to baseline Postop Assessment: no apparent nausea or vomiting Anesthetic complications: no    Last Vitals:  Vitals:   10/07/18 1250 10/07/18 1345  BP: (!) 107/46 115/70  Pulse: 98 96  Resp: 15   Temp:  36.4 C  SpO2: 98% 100%    Last Pain:  Vitals:   10/07/18 1345  TempSrc: Oral  PainSc:                  Lenell Mcconnell A.

## 2018-10-07 NOTE — Consult Note (Addendum)
[facility] Consult: 9:59 AM 10/07/2018  LOS: 1 day    Referring Provider: Dr. Sloan Leiter Primary Care Physician:  [name], NP in [place], [place] Primary Gastroenterologist:  Dr. Marcelle Overlie in [place].  [facility].      Reason for Consultation:  Melena and hematemesis.     HPI: Martin Strong is a 52 y.o. male.  PMH DM 2, not on insulin.  Hypertension.  Nash cirrhosis.  Esophageal varices.  Thrombocytopenia Diagnosed with cirrhosis in February 2017 he presented with acute hematemesis/GI bleed.  He underwent EGD with banding.  It was at this time that his cirrhosis was discovered.  After testing for various etiologies, the diagnosis was that of Nash cirrhosis.  He has had 2 other EGDs at least 1 of which involved repeat esophageal variceal banding.  The last time he had an EGD was probably 18 months ago.  He takes propanolol and oral iron on a daily basis.  He uses ibuprofen 400 mg twice a month. Patient had colonoscopy within the last 3 years.  Developed melenic, black/tarry stools Wednesday evening 10/23 and shortly after that had an episode of hematemesis.  Repeat hematemesis the following AM, none since.  Last Melena was yest afternoon.  Had some fleeting dizziness.  Feels well this AM, no further nausea, no dizziness.  Has not had reflux symptoms, loss of appetite, increased abdominal girth, lower extremity edema.  No previous nausea.  No abdominal pain.  Normally has brown stools. Hgb 8.6 >> 8.2.   MCV 96 Platelets of 56 PT/INR 16.5/1.3. BUN 56, creatinine 0.8. Left knee is normal other than albumin of 3.0 FOBT positive.  Patient does not drink alcohol, never drank any significant alcohol in his lifetime.  He lives in [place]. Family history  significant for a mother who died at age 67 from complications of [name] cirrhosis.  Past Medical History:  Diagnosis Date  . Cirrhosis ([place])   . Diabetes mellitus without complication ([place])   . Fatty liver   . GI bleed   . Hypertension     Past Surgical History:  Procedure Laterality Date  . esophageal bands      Prior to Admission medications   Medication Sig Start Date End Date Taking? Authorizing Provider  cholecalciferol (VITAMIN D) 1000 units tablet Take 1,000 Units by mouth 2 (two) times daily.   Yes [provider]  Ferrous Sulfate (IRON) 325 (65 Fe) MG TABS Take 325 mg by mouth 2 (two) times daily.   Yes [provider]  FLAX OIL-FISH OIL-BORAGE OIL PO Take 1 tablet by mouth daily.   Yes [provider]  glipiZIDE (GLUCOTROL) 10 MG tablet Take 10 mg by mouth 2 (two) times daily.   Yes [provider]  lisinopril (PRINIVIL,ZESTRIL) 20 MG tablet Take 20 mg by mouth 2 (two) times daily.   Yes [provider]  metFORMIN (GLUCOPHAGE) 1000 MG tablet Take 1,000 mg by mouth 2 (two) times daily with a meal.   Yes [provider]  PROPRANOLOL HCL  PO Take 1 tablet by mouth 2 (two) times daily.   Yes [provider]    Scheduled Meds: . insulin aspart  0-9 Units Subcutaneous Q4H  . sodium chloride flush  3 mL Intravenous Q12H   Infusions: . cefTRIAXone (ROCEPHIN)  IV    . octreotide  (SANDOSTATIN)    IV infusion 25 mcg/hr (10/07/18 9326)  . pantoprozole (PROTONIX) infusion 8 mg/hr (10/07/18 0838)   PRN Meds: acetaminophen **OR** acetaminophen, morphine injection, ondansetron **OR** ondansetron (ZOFRAN) IV   Allergies as of 10/06/2018 - Review Complete 10/06/2018  Allergen Reaction Noted  . Nadolol Nausea Only 10/06/2018    Family History  Problem Relation Age of Onset  . Diabetes Other     Social History   Socioeconomic History  . Marital status: Married    Spouse name: Not on file  . Number of children:  Not on file  . Years of education: Not on file  . Highest education level: Not on file  Occupational History  . Not on file  Social Needs  . Financial resource strain: Not on file  . Food insecurity:    Worry: Not on file    Inability: Not on file  . Transportation needs:    Medical: Not on file    Non-medical: Not on file  Tobacco Use  . Smoking status: Never Smoker  . Smokeless tobacco: Never Used  Substance and Sexual Activity  . Alcohol use: Never    Frequency: Never  . Drug use: Never  . Sexual activity: Not on file  Lifestyle  . Physical activity:    Days per week: Not on file    Minutes per session: Not on file  . Stress: Not on file  Relationships  . Social connections:    Talks on phone: Not on file    Gets together: Not on file    Attends religious service: Not on file    Active member of club or organization: Not on file    Attends meetings of clubs or organizations: Not on file    Relationship status: Not on file  . Intimate partner violence:    Fear of current or ex partner: Not on file    Emotionally abused: Not on file    Physically abused: Not on file    Forced sexual activity: Not on file  Other Topics Concern  . Not on file  Social History Narrative  . Not on file    REVIEW OF SYSTEMS: Constitutional: Generally patient does not suffer from weakness or dizziness. ENT:  No nose bleeds Pulm: No shortness of breath or cough. CV:  No palpitations, no LE edema.  Chest pain GU:  No hematuria, no frequency GI:  Per HPI Heme: No unusual bleeding or bruising.  He has vague recall of being told that he had low platelets. Transfusions: Patient does not recall prior blood product transfusions. Neuro:  No headaches, no peripheral tingling or numbness Derm:  No itching, no rash or sores.  Endocrine:  No sweats or chills.  No polyuria or dysuria Immunization: Not queried   PHYSICAL EXAM: Vital signs in last 24 hours: Vitals:   10/06/18 2055 10/07/18  0441  BP: 116/63 92/63  Pulse: (!) 120 (!) 113  Resp: 18 18  Temp: 98 F (36.7 C) 98.2 F (36.8 C)  SpO2: 99% 95%   Wt Readings from Last 3 Encounters:  10/07/18 113.6 kg    General: Pleasant, overweight, non-ill-appearing WM.  Sitting comfortably in bed. Head:  No facial asymmetry or swelling.  No signs of head trauma. Eyes: No scleral icterus.  No conjunctival pallor.  EOMI. Ears: Not hard of hearing Nose: No congestion, no discharge. Mouth: Moist, clear, pink oropharynx.  Tongue midline.  Many missing teeth. Neck: No JVD, no masses, no thyromegaly. Lungs: No dyspnea or cough.  Lungs clear with good breath sounds bilaterally Heart: RRR.  No MRG.  S1, S2 present Abdomen: Soft.  Slightly obese.  Not distended.  Not tender.  Active bowel sounds.  No HSM, masses, bruits, hernias..   Rectal: Not performed.  Performed in ED.  There was black, tarry stool which was FOBT positive Musc/Skeltl: Joint swelling, redness or gross deformity. Extremities: CCE. Neurologic: Tremor or asterixis.  Moves all 4 limbs with full strength.  No gross deficits.  Good historian. Skin: No rash, no sores,  Nodes: No cervical adenopathy Psych: Does not, cooperative, fluid speech.  Intake/Output from previous day: 10/24 0701 - 10/25 0700 In: 2899.9 [I.V.:657; IV Piggyback:2242.9] Out: 400 [Urine:400] Intake/Output this shift: Total I/O In: 181.8 [I.V.:171.2; IV Piggyback:10.6] Out: 400 [Urine:400]  LAB RESULTS: Recent Labs    10/06/18 1517 10/07/18 0019 10/07/18 0740  WBC 11.5*  --  6.1  HGB 10.9* 8.6* 8.2*  8.3*  HCT 32.1* 24.7* 25.0*  25.0*  PLT 127*  --  56*   BMET Lab Results  Component Value Date   NA 138 10/07/2018   NA 135 10/06/2018   K 3.4 (L) 10/07/2018   K 5.2 (H) 10/06/2018   CL 110 10/07/2018   CL 105 10/06/2018   CO2 21 (L) 10/07/2018   CO2 23 10/06/2018   GLUCOSE 108 (H) 10/07/2018   GLUCOSE 180 (H) 10/06/2018   BUN 56 (H) 10/07/2018   BUN 76 (H) 10/06/2018    CREATININE 0.89 10/07/2018   CREATININE 1.01 10/06/2018   CALCIUM 8.5 (L) 10/07/2018   CALCIUM 9.3 10/06/2018   LFT Recent Labs    10/06/18 1517 10/07/18 0019  PROT 6.9 5.3*  ALBUMIN 3.7 3.0*  AST 42* 40  ALT 45* 40  ALKPHOS 54 41  BILITOT 1.4* 1.1   PT/INR Lab Results  Component Value Date   INR 1.34 10/07/2018   Hepatitis Panel No results for input(s): HEPBSAG, HCVAB, HEPAIGM, HEPBIGM in the last 72 hours. C-Diff No components found for: CDIFF Lipase  No results found for: LIPASE  Drugs of Abuse  No results found for: LABOPIA, COCAINSCRNUR, LABBENZ, AMPHETMU, THCU, LABBARB   RADIOLOGY STUDIES: No results found.   IMPRESSION:   *   Hematemesis, melena.  History esophageal variceal bleeding and banding.  *   NASH cirrhosis.  MELD 9    *    Normocytic anemia.  *     Thrombocytopenia.   PLAN:     *   Continue the octreotide and Protonix drip along with daily Rocephin  *    Working on scheduling EGD for this afternoon.  Patient aware.  Keep n.p.o.  *    Ordering AFP level for the morning.  At some point he is going to need imaging to screen for [place]  10/07/2018, 9:59 AM Phone [phone]      Attending physician's note   I have taken an interval history, reviewed the chart and examined the patient. Hx of NASH cirrhosis c/b esophageal varices requiring EVL in the past along with taking Propranolol as outpatient, presenting with UGIB. Treated with Octroetide, PPI and Cipro on admission with plan  for EGD for diagnostic and therapeutic intent today. MELD 10 (6% 59-monthmortality) and CP A (6 pts).  I agree with the Advanced Practitioner's note, impression, and recommendations, with additional recommendations pending endoscopic findings.   [address] DO, FACG ([phone]

## 2018-10-07 NOTE — Op Note (Signed)
[facility] Patient Name: Martin Strong Procedure Date : 10/07/2018 MRN: [id number] Attending MD: Gerrit Heck , MD Date of Birth: 1966/09/27 CSN: [id number] Age: 52 Admit Type: Inpatient Procedure:                Upper GI endoscopy Indications:              Acute post hemorrhagic anemia, Coffee-ground                            emesis, Melena, Esophageal varices                           52 yo male with a history of NASH cirrhosis                            requiring prior EVL, last approximately 18 months                            ago in [place], [place], presenting with melena, coffee                            ground emesis and anemia, with elevated WFU:XNATF                            ratio at 76:1.0. Providers:                Gerrit Heck, MD, Zenon Mayo, RN, Elspeth Cho Tech., Technician, Luane School, CRNA Referring MD:              Medicines:                Monitored Anesthesia Care Complications:            No immediate complications. Estimated Blood Loss:     Estimated blood loss was minimal. Procedure:                Pre-Anesthesia Assessment:                           - Prior to the procedure, a History and Physical                            was performed, and patient medications and                            allergies were reviewed. The patient's tolerance of                            previous anesthesia was also reviewed. The risks                            and benefits of the procedure and the sedation  options and risks were discussed with the patient.                            All questions were answered, and informed consent                            was obtained. Prior Anticoagulants: The patient has                            taken no previous anticoagulant or antiplatelet                            agents. ASA Grade Assessment: III - A patient with                            severe  systemic disease. After reviewing the risks                            and benefits, the patient was deemed in                            satisfactory condition to undergo the procedure.                           After obtaining informed consent, the endoscope was                            passed under direct vision. Throughout the                            procedure, the patient's blood pressure, pulse, and                            oxygen saturations were monitored continuously. The                            [id number]-H190 ([id number]) Olympus adult EGD was introduced                            through the mouth, and advanced to the second part                            of duodenum. The upper GI endoscopy was                            accomplished without difficulty. The patient                            tolerated the procedure well. Scope In: Scope Out: Findings:      Grade II varices were found in the lower third of the esophagus. There       was high grade stigmata on 2 variceal columns (red wale). They were 5 mm       in largest diameter. Two  bands were successfully placed with complete       eradication, resulting in deflation of varices. There was no bleeding at       the end of the procedure. Scars noted in the lower esophagus from       previous banding.      Moderate portal hypertensive gastropathy was found in the entire       examined stomach. No active bleeding nor stigmata of bleeding in the       stomach.      The duodenal bulb, first portion of the duodenum and second portion of       the duodenum were normal. Impression:               - Grade II esophageal varices. Completely                            eradicated. Banded with 2 bands.                           - Portal hypertensive gastropathy.                           - Normal duodenal bulb, first portion of the                            duodenum and second portion of the duodenum.                           - No  specimens collected. Recommendation:           - Return patient to hospital ward for ongoing care.                           - Clear liquid diet today.                           - Continue present medications.                           - Return to GI clinic in 4 weeks.                           - Repeat upper endoscopy in approximately 8 weeks                            for retreatment.                           - Resume Octreotide through today and antibiotics                            x5 days.                           - Resume Propranolol as prescribed. Procedure Code(s):        --- Professional ---  43244, Esophagogastroduodenoscopy, flexible,                            transoral; with band ligation of esophageal/gastric                            varices Diagnosis Code(s):        --- Professional ---                           I85.00, Esophageal varices without bleeding                           K76.6, Portal hypertension                           K31.89, Other diseases of stomach and duodenum                           D62, Acute posthemorrhagic anemia                           K92.0, Hematemesis                           K92.1, Melena (includes Hematochezia) CPT copyright 2018 American Medical Association. All rights reserved. The codes documented in this report are preliminary and upon coder review may  be revised to meet current compliance requirements. Gerrit Heck, MD 10/07/2018 12:53:02 PM Number of Addenda: 0

## 2018-10-07 NOTE — Anesthesia Procedure Notes (Signed)
Procedure Name: MAC Date/Time: 10/07/2018 12:14 PM Performed by: Renato Shin, CRNA Pre-anesthesia Checklist: Patient identified, Emergency Drugs available, Suction available and Patient being monitored Patient Re-evaluated:Patient Re-evaluated prior to induction Oxygen Delivery Method: Nasal cannula Preoxygenation: Pre-oxygenation with 100% oxygen Induction Type: IV induction Placement Confirmation: positive ETCO2,  CO2 detector and breath sounds checked- equal and bilateral Dental Injury: Teeth and Oropharynx as per pre-operative assessment

## 2018-10-07 NOTE — Progress Notes (Signed)
Antibiotic change:   Pt started on Cipro for SBP prophylaxis. D/w on rounds today and change abx to ceftriaxone.   Onnie Boer, PharmD, [place], AAHIVP, CPP Infectious Disease Pharmacist Pager: [phone] 10/07/2018 10:16 AM

## 2018-10-08 LAB — COMPREHENSIVE METABOLIC PANEL
ALT: 48 U/L — ABNORMAL HIGH (ref 0–44)
AST: 52 U/L — ABNORMAL HIGH (ref 15–41)
Albumin: 3.1 g/dL — ABNORMAL LOW (ref 3.5–5.0)
Alkaline Phosphatase: 45 U/L (ref 38–126)
Anion gap: 8 (ref 5–15)
BUN: 21 mg/dL — ABNORMAL HIGH (ref 6–20)
CO2: 23 mmol/L (ref 22–32)
Calcium: 8.3 mg/dL — ABNORMAL LOW (ref 8.9–10.3)
Chloride: 106 mmol/L (ref 98–111)
Creatinine, Ser: 0.87 mg/dL (ref 0.61–1.24)
GFR calc Af Amer: >60 mL/min (ref >60)
GFR calc non Af Amer: >60 mL/min (ref >60)
Glucose, Bld: 128 mg/dL — ABNORMAL HIGH (ref 70–99)
Potassium: 3.9 mmol/L (ref 3.5–5.1)
Sodium: 137 mmol/L (ref 135–145)
Total Bilirubin: 1.2 mg/dL (ref 0.3–1.2)
Total Protein: 5.6 g/dL — ABNORMAL LOW (ref 6.5–8.1)

## 2018-10-08 LAB — GLUCOSE, CAPILLARY
Glucose-Capillary: 124 mg/dL — ABNORMAL HIGH (ref 70–99)
Glucose-Capillary: 127 mg/dL — ABNORMAL HIGH (ref 70–99)
Glucose-Capillary: 128 mg/dL — ABNORMAL HIGH (ref 70–99)
Glucose-Capillary: 157 mg/dL — ABNORMAL HIGH (ref 70–99)
Glucose-Capillary: 164 mg/dL — ABNORMAL HIGH (ref 70–99)
Glucose-Capillary: 199 mg/dL — ABNORMAL HIGH (ref 70–99)
Glucose-Capillary: 214 mg/dL — ABNORMAL HIGH (ref 70–99)

## 2018-10-08 LAB — CBC
HCT: 24.5 % — ABNORMAL LOW (ref 39.0–52.0)
Hemoglobin: 8.2 g/dL — ABNORMAL LOW (ref 13.0–17.0)
MCH: 32.3 pg (ref 26.0–34.0)
MCHC: 33.5 g/dL (ref 30.0–36.0)
MCV: 96.5 fL (ref 80.0–100.0)
Platelets: 53 10*3/uL — ABNORMAL LOW (ref 150–400)
RBC: 2.54 MIL/uL — ABNORMAL LOW (ref 4.22–5.81)
RDW: 13.9 % (ref 11.5–15.5)
WBC: 4.7 10*3/uL (ref 4.0–10.5)
nRBC: 0 % (ref 0.0–0.2)

## 2018-10-08 LAB — HEMOGLOBIN AND HEMATOCRIT, BLOOD
HCT: 23.9 % — ABNORMAL LOW (ref 39.0–52.0)
Hemoglobin: 8.2 g/dL — ABNORMAL LOW (ref 13.0–17.0)

## 2018-10-08 LAB — HEMOGLOBIN A1C
Hgb A1c MFr Bld: 7.4 % — ABNORMAL HIGH (ref 4.8–5.6)
Mean Plasma Glucose: 165.68 mg/dL

## 2018-10-08 NOTE — Progress Notes (Addendum)
     [facility] Progress Note   Chief Complaint:  Upper GI bleed , cirrhosis   SUBJECTIVE:    no further bleeding. No BMs. Wants more than clears.   ASSESSMENT AND PLAN:   1. 52 yo male diagnosed with NASH cirrhosis in Feb 2017. Admitted now with upper GI bleeding. -EGD yesterday Grade II esophageal varices with stigmata of bleeding. Completely eradicated. Banded with 2 bands. Portal hypertensive gastropathy. Normal duodenal bulb and duodenum -continue octreotide, IV PPI -5 days total of abx for SBP prophylaxis -repeat EGD in a couple of months. Will send note to Dr. Vivia Ewing nurse to make patient follow up appt in office. Patient wants to establish care with [place] (closer to [place] home) -[place] screening. AFP in progress.  Needs liver imaging at some point  -advance to full liquids  2. Normocytic anemia. Presenting hgb 10.9, currently stable in low 8 range. No blood transfusion required  .  [facility] Attending   I have taken an interval history, reviewed the chart and examined the patient. I agree with the Advanced Practitioner's note, impression and recommendations.  He will need repeat EGD as early as 2 weeks but probably better at about 4 weeks.  If doing ok tomorrow will stop octreotide Qd oral PPI  Advance to low Na diet  Gatha Mayer, MD, [facility] 10/08/2018 7:51 PM Pager [phone]   He will need a repeat EGD sooner than a couple of mos - as ear   OBJECTIVE:     Vital signs in last 24 hours: Temp:  [97.6 F (36.4 C)-98 F (36.7 C)] 98 F (36.7 C) (10/25 2114) Pulse Rate:  [91-108] 91 (10/25 2114) Resp:  [11-20] 18 (10/25 2114) BP: (104-135)/(46-75) 104/60 (10/25 2114) SpO2:  [98 %-100 %] 100 % (10/25 2114) Weight:  [107.5 kg] 107.5 kg (10/26 0700) Last BM Date: 10/06/18 General:   Alert male in NAD EENT:  Normal hearing, non icteric sclera, conjunctive pink.  Heart:  Regular rate and rhythm; +  Murmur. No lower  extremity edema Pulm: Normal respiratory effort Abdomen:  Soft, protuberant,  nontender.  Normal bowel sounds     Neurologic:  Alert and  oriented x4;  grossly normal neurologically. Psych:  Pleasant, cooperative.  Normal mood and affect.    Lab Results: Recent Labs    10/06/18 1517  10/07/18 0740 10/07/18 1455 10/08/18 0435  WBC 11.5*  --  6.1  --  4.7  HGB 10.9*   < > 8.2*  8.3* 8.9* 8.2*  HCT 32.1*   < > 25.0*  25.0* 28.1* 24.5*  PLT 127*  --  56*  --  53*   < > = values in this interval not displayed.   BMET Recent Labs    10/06/18 1517 10/07/18 0019 10/08/18 0435  NA 135 138 137  K 5.2* 3.4* 3.9  CL 105 110 106  CO2 23 21* 23  GLUCOSE 180* 108* 128*  BUN 76* 56* 21*  CREATININE 1.01 0.89 0.87  CALCIUM 9.3 8.5* 8.3*   LFT Recent Labs    10/08/18 0435  PROT 5.6*  ALBUMIN 3.1*  AST 52*  ALT 48*  ALKPHOS 45  BILITOT 1.2   PT/INR Recent Labs    10/07/18 0740  LABPROT 16.5*  INR 1.34         LOS: 2 days   Tye Savoy ,NP 10/08/2018, 10:05 AM

## 2018-10-08 NOTE — Progress Notes (Signed)
PROGRESS NOTE        PATIENT DETAILS Name: Martin Strong Age: 52 y.o. Sex: male Date of Birth: March 01, 1966 Admit Date: 10/06/2018 Admitting Physician Vianne Bulls, MD [id number], Ronn Melena, NP  Brief Narrative:  Patient in bed, appears comfortable, denies any headache, no fever, no chest pain or pressure, no shortness of breath , no abdominal pain. No focal weakness.  Subjective:  Patient in bed, appears comfortable, denies any headache, no fever, no chest pain or pressure, no shortness of breath , no abdominal pain. No focal weakness.   Assessment/Plan:  Acute Upper GI bleeding with acute blood loss anemia: clinical suspicious for UGI Variceal Bleed, GI following, currently on IV PPI and octreotide along with IV Rocephin for SBP prophylaxis, no transfusions yet, continue to monitor H&H closely.  EGD did not reveal any acute bleeding but possible stigmata of recent variceal bleed.  Continue propanolol at the time of discharge.  Thrombocytopenia: Follow for now-repeating CBC-if significant drop in platelet count-may need platelet transfusion.  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  Hypertension: Blood pressure stable with soft-continue to hold all antihypertensives  DM-2: CBG stable with SSI-hold all oral hypoglycemics.  No results found for: HGBA1C  CBG (last 3)  Recent Labs    10/08/18 0114 10/08/18 0426 10/08/18 0818  GLUCAP 124* 127* 128*     DVT Prophylaxis: SCD's  Code Status: Full code   Family Communication: None at bedside  Disposition Plan: Remain inpatient not stable for discharge at this point.  Antimicrobial agents: Anti-infectives (From admission, onward)   Start     Dose/Rate Route Frequency Ordered Stop   10/07/18 1000  ciprofloxacin (CIPRO) IVPB 400 mg  Status:  Discontinued     400 mg 200 mL/hr over 60 Minutes Intravenous Every 12 hours  10/06/18 1955 10/07/18 0922   10/07/18 1000  cefTRIAXone (ROCEPHIN) 2 g in sodium chloride 0.9 % 100 mL IVPB     2 g 200 mL/hr over 30 Minutes Intravenous Every 24 hours 10/07/18 0922     10/06/18 1845  ciprofloxacin (CIPRO) IVPB 400 mg     400 mg 200 mL/hr over 60 Minutes Intravenous  Once 10/06/18 1838 10/06/18 2213      Procedures: None  CONSULTS:  GI  Time spent: 25 minutes-Greater than 50% of this time was spent in counseling, explanation of diagnosis, planning of further management, and coordination of care.  MEDICATIONS: Scheduled Meds: . insulin aspart  0-9 Units Subcutaneous Q4H  . sodium chloride flush  3 mL Intravenous Q12H   Continuous Infusions: . cefTRIAXone (ROCEPHIN)  IV 2 g (10/08/18 0840)  . octreotide  (SANDOSTATIN)    IV infusion 25 mcg/hr (10/07/18 1810)  . pantoprozole (PROTONIX) infusion 8 mg/hr (10/08/18 0422)   PRN Meds:.acetaminophen **OR** acetaminophen, morphine injection, ondansetron **OR** ondansetron (ZOFRAN) IV   PHYSICAL EXAM: Vital signs: Vitals:   10/07/18 1250 10/07/18 1345 10/07/18 2114 10/08/18 0700  BP: (!) 107/46 115/70 104/60   Pulse: 98 96 91   Resp: 15 16 18    Temp:  97.6 F (36.4 C) 98 F (36.7 C)   TempSrc:  Oral Oral   SpO2: 98% 100% 100%   Weight:    107.5 kg  Height:       Filed Weights   10/06/18 1443 10/07/18 0429  10/08/18 0700  Weight: 110.7 kg 113.6 kg 107.5 kg   Body mass index is 32.13 kg/m.   Exam  Awake Alert, Oriented X 3, No new F.N deficits, Normal affect [place].AT,PERRAL Supple Neck,No JVD, No cervical lymphadenopathy appriciated.  Symmetrical Chest wall movement, Good air movement bilaterally, CTAB RRR,No Gallops, Rubs or new Murmurs, No Parasternal Heave +ve B.Sounds, Abd Soft, No tenderness, No organomegaly appriciated, No rebound - guarding or rigidity. No Cyanosis, Clubbing or edema, No new Rash or bruise   I have personally reviewed following labs and imaging studies  LABORATORY  DATA: CBC: Recent Labs  Lab 10/06/18 1517 10/07/18 0019 10/07/18 0740 10/07/18 1455 10/08/18 0435  WBC 11.5*  --  6.1  --  4.7  HGB 10.9* 8.6* 8.2*  8.3* 8.9* 8.2*  HCT 32.1* 24.7* 25.0*  25.0* 28.1* 24.5*  MCV 96.7  --  96.9  --  96.5  PLT 127*  --  56*  --  53*    Basic Metabolic Panel: Recent Labs  Lab 10/06/18 1517 10/07/18 0019 10/08/18 0435  NA 135 138 137  K 5.2* 3.4* 3.9  CL 105 110 106  CO2 23 21* 23  GLUCOSE 180* 108* 128*  BUN 76* 56* 21*  CREATININE 1.01 0.89 0.87  CALCIUM 9.3 8.5* 8.3*    GFR: Estimated Creatinine Clearance: 128.7 mL/min (by C-G formula based on SCr of 0.87 mg/dL).  Liver Function Tests: Recent Labs  Lab 10/06/18 1517 10/07/18 0019 10/08/18 0435  AST 42* 40 52*  ALT 45* 40 48*  ALKPHOS 54 41 45  BILITOT 1.4* 1.1 1.2  PROT 6.9 5.3* 5.6*  ALBUMIN 3.7 3.0* 3.1*   No results for input(s): LIPASE, AMYLASE in the last 168 hours. No results for input(s): AMMONIA in the last 168 hours.  Coagulation Profile: Recent Labs  Lab 10/07/18 0740  INR 1.34    Cardiac Enzymes: No results for input(s): CKTOTAL, CKMB, CKMBINDEX, TROPONINI in the last 168 hours.  BNP (last 3 results) No results for input(s): PROBNP in the last 8760 hours.  HbA1C: No results for input(s): HGBA1C in the last 72 hours.  CBG: Recent Labs  Lab 10/07/18 1619 10/07/18 1917 10/08/18 0114 10/08/18 0426 10/08/18 0818  GLUCAP 229* 155* 124* 127* 128*    Lipid Profile: No results for input(s): CHOL, HDL, LDLCALC, TRIG, CHOLHDL, LDLDIRECT in the last 72 hours.  Thyroid Function Tests: No results for input(s): TSH, T4TOTAL, FREET4, T3FREE, THYROIDAB in the last 72 hours.  Anemia Panel: No results for input(s): VITAMINB12, FOLATE, FERRITIN, TIBC, IRON, RETICCTPCT in the last 72 hours.  Urine analysis: No results found for: COLORURINE, APPEARANCEUR, LABSPEC, PHURINE, GLUCOSEU, HGBUR, BILIRUBINUR, KETONESUR, PROTEINUR, UROBILINOGEN, NITRITE,  LEUKOCYTESUR  Sepsis Labs: Lactic Acid, Venous No results found for: LATICACIDVEN  MICROBIOLOGY: No results found for this or any previous visit (from the past 240 hour(s)).  RADIOLOGY STUDIES/RESULTS: No results found.   LOS: 2 days   Signature  Lala Lund M.D on 10/08/2018 at 10:45 AM   -  To page go to [email or web address] - password [email or web address]

## 2018-10-09 LAB — HEMOGLOBIN AND HEMATOCRIT, BLOOD
HCT: 22.8 % — ABNORMAL LOW (ref 39.0–52.0)
HCT: 23.7 % — ABNORMAL LOW (ref 39.0–52.0)
Hemoglobin: 7.8 g/dL — ABNORMAL LOW (ref 13.0–17.0)
Hemoglobin: 8.1 g/dL — ABNORMAL LOW (ref 13.0–17.0)

## 2018-10-09 LAB — GLUCOSE, CAPILLARY
Glucose-Capillary: 120 mg/dL — ABNORMAL HIGH (ref 70–99)
Glucose-Capillary: 139 mg/dL — ABNORMAL HIGH (ref 70–99)
Glucose-Capillary: 282 mg/dL — ABNORMAL HIGH (ref 70–99)
Glucose-Capillary: 199 mg/dL — ABNORMAL HIGH (ref 70–99)
Glucose-Capillary: 192 mg/dL — ABNORMAL HIGH (ref 70–99)

## 2018-10-09 LAB — FERRITIN: Ferritin: 92 ng/mL (ref 24–336)

## 2018-10-09 LAB — VITAMIN B12: Vitamin B-12: 1099 pg/mL — ABNORMAL HIGH (ref 180–914)

## 2018-10-09 LAB — AFP TUMOR MARKER: AFP, Serum, Tumor Marker: <0.9 ng/mL (ref 0.0–8.3)

## 2018-10-09 LAB — IRON AND TIBC
Iron: 33 ug/dL — ABNORMAL LOW (ref 45–182)
Saturation Ratios: 12 % — ABNORMAL LOW (ref 17.9–39.5)
TIBC: 270 ug/dL (ref 250–450)
UIBC: 237 ug/dL

## 2018-10-09 MED ORDER — PANTOPRAZOLE SODIUM 40 MG PO TBEC
40.0000 mg | DELAYED_RELEASE_TABLET | Freq: Every day | ORAL | Status: DC
  Administered 2018-10-09 – 2018-10-10 (×2): 40 mg via ORAL
  Filled 2018-10-09 (×2): qty 1

## 2018-10-09 NOTE — Progress Notes (Signed)
PROGRESS NOTE        PATIENT DETAILS Name: Martin Strong Age: 52 y.o. Sex: male Date of Birth: Sep 22, 1966 Admit Date: 10/06/2018 Admitting Physician Vianne Bulls, MD [id number], Ronn Melena, NP  Brief Narrative:  Patient in bed, appears comfortable, denies any headache, no fever, no chest pain or pressure, no shortness of breath , no abdominal pain. No focal weakness.  Subjective:  Patient in bed, appears comfortable, denies any headache, no fever, no chest pain or pressure, no shortness of breath , no abdominal pain. No focal weakness.   Assessment/Plan:  Acute Upper GI bleeding with acute blood loss anemia: clinical suspicious for UGI Variceal Bleed, EGD revealed grade 2 esophageal varices with stigmata of bleeding, they were completely eradicated and banded with 2 bands.  He was kept on IV PPI-IV octreotide, H&H has remained largely stable, no nausea vomiting, bowel movements last night were brown.  At this time will stop IV octreotide and PPI, once a day oral Protonix.  Monitor H&H.  If stable discharge tomorrow.  Continue propanolol will be discharged on it.  Total 5 days of IV Rocephin/oral Vantin for SBP prophylaxis.  Thrombocytopenia: Follow for now-repeating CBC- if significant drop in platelet count-may need platelet transfusion.  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  Hypertension: Blood pressure stable with soft-continue to hold all antihypertensives  DM-2: CBG stable with SSI-hold all oral hypoglycemics.  Lab Results  Component Value Date   HGBA1C 7.4 (H) 10/08/2018    CBG (last 3)  Recent Labs    10/08/18 2353 10/09/18 0525 10/09/18 0816  GLUCAP 164* 120* 139*     DVT Prophylaxis: SCD's  Code Status: Full code   Family Communication: Wife at bedside  Disposition Plan: Med  Antimicrobial agents: Anti-infectives (From admission, onward)   Start      Dose/Rate Route Frequency Ordered Stop   10/07/18 1000  ciprofloxacin (CIPRO) IVPB 400 mg  Status:  Discontinued     400 mg 200 mL/hr over 60 Minutes Intravenous Every 12 hours 10/06/18 1955 10/07/18 0922   10/07/18 1000  cefTRIAXone (ROCEPHIN) 2 g in sodium chloride 0.9 % 100 mL IVPB     2 g 200 mL/hr over 30 Minutes Intravenous Every 24 hours 10/07/18 0922     10/06/18 1845  ciprofloxacin (CIPRO) IVPB 400 mg     400 mg 200 mL/hr over 60 Minutes Intravenous  Once 10/06/18 1838 10/06/18 2213      Procedures: EGD  CONSULTS:  GI  Time spent: 25 minutes-Greater than 50% of this time was spent in counseling, explanation of diagnosis, planning of further management, and coordination of care.  MEDICATIONS: Scheduled Meds: . insulin aspart  0-9 Units Subcutaneous Q4H  . sodium chloride flush  3 mL Intravenous Q12H   Continuous Infusions: . cefTRIAXone (ROCEPHIN)  IV 2 g (10/09/18 0935)  . octreotide  (SANDOSTATIN)    IV infusion 25 mcg/hr (10/08/18 1343)  . pantoprozole (PROTONIX) infusion 8 mg/hr (10/08/18 2349)   PRN Meds:.acetaminophen **OR** acetaminophen, morphine injection, ondansetron **OR** ondansetron (ZOFRAN) IV   PHYSICAL EXAM: Vital signs: Vitals:   10/08/18 0700 10/08/18 1324 10/08/18 2110 10/09/18 0529  BP:  120/69 134/79 (!) 103/45  Pulse:  93 (!) 109 94  Resp:  14 18 19   Temp:  98.1 F (36.7 C)  98.7 F (37.1 C) 98.6 F (37 C)  TempSrc:   Oral Oral  SpO2:  98% 99% 96%  Weight: 107.5 kg     Height:       Filed Weights   10/06/18 1443 10/07/18 0429 10/08/18 0700  Weight: 110.7 kg 113.6 kg 107.5 kg   Body mass index is 32.13 kg/m.   Exam  Awake Alert, Oriented X 3, No new F.N deficits, Normal affect [place].AT,PERRAL Supple Neck,No JVD, No cervical lymphadenopathy appriciated.  Symmetrical Chest wall movement, Good air movement bilaterally, CTAB RRR,No Gallops, Rubs or new Murmurs, No Parasternal Heave +ve B.Sounds, Abd Soft, No tenderness, No  organomegaly appriciated, No rebound - guarding or rigidity. No Cyanosis, Clubbing or edema, No new Rash or bruise  I have personally reviewed following labs and imaging studies  LABORATORY DATA:  CBC:  Recent Labs  Lab 10/06/18 1517  10/07/18 0740 10/07/18 1455 10/08/18 0435 10/08/18 1547 10/09/18 0008 10/09/18 0728  WBC 11.5*  --  6.1  --  4.7  --   --   --   HGB 10.9*   < > 8.2*  8.3* 8.9* 8.2* 8.2* 8.1* 7.8*  HCT 32.1*   < > 25.0*  25.0* 28.1* 24.5* 23.9* 23.7* 22.8*  MCV 96.7  --  96.9  --  96.5  --   --   --   PLT 127*  --  56*  --  53*  --   --   --    < > = values in this interval not displayed.    Basic Metabolic Panel:  Recent Labs  Lab 10/06/18 1517 10/07/18 0019 10/08/18 0435  NA 135 138 137  K 5.2* 3.4* 3.9  CL 105 110 106  CO2 23 21* 23  GLUCOSE 180* 108* 128*  BUN 76* 56* 21*  CREATININE 1.01 0.89 0.87  CALCIUM 9.3 8.5* 8.3*    GFR: Estimated Creatinine Clearance: 128.7 mL/min (by C-G formula based on SCr of 0.87 mg/dL).  Liver Function Tests: Recent Labs  Lab 10/06/18 1517 10/07/18 0019 10/08/18 0435  AST 42* 40 52*  ALT 45* 40 48*  ALKPHOS 54 41 45  BILITOT 1.4* 1.1 1.2  PROT 6.9 5.3* 5.6*  ALBUMIN 3.7 3.0* 3.1*   No results for input(s): LIPASE, AMYLASE in the last 168 hours. No results for input(s): AMMONIA in the last 168 hours.  Coagulation Profile: Recent Labs  Lab 10/07/18 0740  INR 1.34    Cardiac Enzymes: No results for input(s): CKTOTAL, CKMB, CKMBINDEX, TROPONINI in the last 168 hours.  BNP (last 3 results) No results for input(s): PROBNP in the last 8760 hours.  HbA1C: Recent Labs    10/08/18 0435  HGBA1C 7.4*    CBG: Recent Labs  Lab 10/08/18 1656 10/08/18 2110 10/08/18 2353 10/09/18 0525 10/09/18 0816  GLUCAP 157* 199* 164* 120* 139*    Lipid Profile: No results for input(s): CHOL, HDL, LDLCALC, TRIG, CHOLHDL, LDLDIRECT in the last 72 hours.  Thyroid Function Tests: No results for  input(s): TSH, T4TOTAL, FREET4, T3FREE, THYROIDAB in the last 72 hours.  Anemia Panel: No results for input(s): VITAMINB12, FOLATE, FERRITIN, TIBC, IRON, RETICCTPCT in the last 72 hours.  Urine analysis: No results found for: COLORURINE, APPEARANCEUR, LABSPEC, PHURINE, GLUCOSEU, HGBUR, BILIRUBINUR, KETONESUR, PROTEINUR, UROBILINOGEN, NITRITE, LEUKOCYTESUR  Sepsis Labs: Lactic Acid, Venous No results found for: LATICACIDVEN  MICROBIOLOGY: No results found for this or any previous visit (from the past 240 hour(s)).  RADIOLOGY STUDIES/RESULTS: No results found.  LOS: 3 days   Signature  Lala Lund M.D on 10/09/2018 at 9:47 AM   -  To page go to [email or web address] - password [facility]

## 2018-10-09 NOTE — Progress Notes (Addendum)
     [facility] Progress Note   Chief Complaint:   Upper GI bleed, cirrhosis   SUBJECTIVE:    no further bleeding. 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 Admitted now with upper GI bleeding. -EGD > Grade II esophageal varices with stigmata of bleeding, banded and completely eradicated. Portal hypertensive gastropathy. Normal duodenal bulb and duodenum -Octreotide already discontinued -continue daily PPI -5 days total of abx for SBP prophylaxis  -repeat EGD in a few weeks ( follow up office appt will be scheduled by our office) . Patient wants to establish care with [place] (closer to [place] home) -[place] screening. AFP in progress.  Needs liver imaging at some point  -continue 2 grams Na+ diet  2. Normocytic anemia. Hgb down to 7.8 from 10.9 on admit.  -check iron studies, may benefit from iron replacement     [facility] Attending   I have taken an interval history, reviewed the chart and examined the patient. I agree with the Advanced Practitioner's note, impression and recommendations.    Lab Results  Component Value Date   FERRITIN 92 10/09/2018   OK to take po iron x 1 at dc but does not need parenteral iron Will check B12 level also  Gatha Mayer, MD, [facility] 10/09/2018 3:01 PM Pager [phone]   OBJECTIVE:     Vital signs in last 24 hours: Temp:  [98.1 F (36.7 C)-98.7 F (37.1 C)] 98.6 F (37 C) (10/27 0529) Pulse Rate:  [93-109] 94 (10/27 0529) Resp:  [14-19] 19 (10/27 0529) BP: (103-134)/(45-79) 103/45 (10/27 0529) SpO2:  [96 %-99 %] 96 % (10/27 0529) Last BM Date: 10/08/18 General:   Alert, well-developed, male in NAD EENT:  Normal hearing, non icteric sclera, conjunctive pink.  Heart:  Regular rate and rhythm;no lower extremity edema Pulm: Normal respiratory effor Abdomen:  Soft, nondistended, nontender.  Normal bowel sounds, no masses felt.       Neurologic:  Alert and  oriented x4;  grossly normal neurologically. Psych:  Pleasant, cooperative.  Normal mood and affect.    Lab Results: Recent Labs    10/06/18 1517  10/07/18 0740  10/08/18 0435 10/08/18 1547 10/09/18 0008 10/09/18 0728  WBC 11.5*  --  6.1  --  4.7  --   --   --   HGB 10.9*   < > 8.2*  8.3*   < > 8.2* 8.2* 8.1* 7.8*  HCT 32.1*   < > 25.0*  25.0*   < > 24.5* 23.9* 23.7* 22.8*  PLT 127*  --  56*  --  53*  --   --   --    < > = values in this interval not displayed.   BMET Recent Labs    10/06/18 1517 10/07/18 0019 10/08/18 0435  NA 135 138 137  K 5.2* 3.4* 3.9  CL 105 110 106  CO2 23 21* 23  GLUCOSE 180* 108* 128*  BUN 76* 56* 21*  CREATININE 1.01 0.89 0.87  CALCIUM 9.3 8.5* 8.3*   LFT Recent Labs    10/08/18 0435  PROT 5.6*  ALBUMIN 3.1*  AST 52*  ALT 48*  ALKPHOS 45  BILITOT 1.2   PT/INR Recent Labs    10/07/18 0740  LABPROT 16.5*  INR 1.34       LOS: 3 days   Tye Savoy ,NP 10/09/2018, 10:00 AM

## 2018-10-10 ENCOUNTER — Encounter (HOSPITAL_COMMUNITY): Payer: Self-pay | Admitting: Gastroenterology

## 2018-10-10 LAB — CBC
HCT: 23 % — ABNORMAL LOW (ref 39.0–52.0)
Hemoglobin: 7.7 g/dL — ABNORMAL LOW (ref 13.0–17.0)
MCH: 32.5 pg (ref 26.0–34.0)
MCHC: 33.5 g/dL (ref 30.0–36.0)
MCV: 97 fL (ref 80.0–100.0)
Platelets: 55 10*3/uL — ABNORMAL LOW (ref 150–400)
RBC: 2.37 MIL/uL — ABNORMAL LOW (ref 4.22–5.81)
RDW: 14.4 % (ref 11.5–15.5)
WBC: 4.7 10*3/uL (ref 4.0–10.5)
nRBC: 0 % (ref 0.0–0.2)

## 2018-10-10 LAB — GLUCOSE, CAPILLARY
Glucose-Capillary: 139 mg/dL — ABNORMAL HIGH (ref 70–99)
Glucose-Capillary: 157 mg/dL — ABNORMAL HIGH (ref 70–99)
Glucose-Capillary: 157 mg/dL — ABNORMAL HIGH (ref 70–99)

## 2018-10-10 MED ORDER — IRON 325 (65 FE) MG PO TABS: 325.0000 mg | ORAL_TABLET | Freq: Two times a day (BID) | ORAL | 0 refills | Status: DC

## 2018-10-10 MED ORDER — FAMOTIDINE 20 MG PO TABS
20.0000 mg | ORAL_TABLET | Freq: Once | ORAL | Status: AC
  Administered 2018-10-10: 20 mg via ORAL
  Filled 2018-10-10: qty 1

## 2018-10-10 MED ORDER — CEFPODOXIME PROXETIL 200 MG PO TABS: 200.0000 mg | ORAL_TABLET | Freq: Two times a day (BID) | ORAL | 0 refills | Status: DC

## 2018-10-10 MED ORDER — PANTOPRAZOLE SODIUM 40 MG PO TBEC: 40.0000 mg | DELAYED_RELEASE_TABLET | Freq: Every day | ORAL | 0 refills | Status: DC

## 2018-10-10 NOTE — Discharge Instructions (Signed)
Follow with Primary MD [name], NP and your GI MD in 7 days   Get CBC, CMP, Iron Panel, B12, Folate  -  checked  by Primary MD in 5-7 days    Activity: As tolerated with Full fall precautions use walker/cane & assistance as needed  Disposition Home    Diet: Heart Healthy - low carbohydrate, check CBGs QA CHS  Special Instructions: If you have smoked or chewed Tobacco  in the last 2 yrs please stop smoking, stop any regular Alcohol  and or any Recreational drug use.  On your next visit with your primary care physician please Get Medicines reviewed and adjusted.  Please request your Prim.MD to go over all Hospital Tests and Procedure/Radiological results at the follow up, please get all Hospital records sent to your Prim MD by signing hospital release before you go home.  If you experience worsening of your admission symptoms, develop shortness of breath, life threatening emergency, suicidal or homicidal thoughts you must seek medical attention immediately by calling 911 or calling your MD immediately  if symptoms less severe.  You Must read complete instructions/literature along with all the possible adverse reactions/side effects for all the Medicines you take and that have been prescribed to you. Take any new Medicines after you have completely understood and accpet all the possible adverse reactions/side effects.

## 2018-10-10 NOTE — Discharge Summary (Signed)
Martin Strong KVQ:[id number] DOB: 1966-02-26 DOA: 10/06/2018  PCP: [name], NP  Admit date: 10/06/2018  Discharge date: 10/10/2018  Admitted From: Home   Disposition:  Home   Recommendations for Outpatient Follow-up:   Follow up with PCP in 1-2 weeks  PCP Please obtain BMP/CBC, 2 view CXR in 1week,  (see Discharge instructions)   PCP Please follow up on the following pending results: Monitor H&H   Home Health: None   Equipment/Devices: None  Consultations: GI Discharge Condition: Stable   CODE STATUS: Full   Diet Recommendation: Heart Healthy Low Carb    Chief Complaint  Patient presents with  . GI Bleeding     Brief history of present illness from the day of admission and additional interim summary     Patient in bed, appears comfortable, denies any headache, no fever, no chest pain or pressure, no shortness of breath , no abdominal pain. No focal weakness.                                                                 Hospital Course   Acute Upper GI bleeding with acute blood loss anemia: clinical suspicious for UGI Variceal Bleed, EGD revealed grade 2 esophageal varices with stigmata of bleeding, they were completely eradicated and banded with 2 bands.  He was kept on IV PPI-IV octreotide, H&H has remained largely stable, no nausea vomiting, bowel movements last night were brown.  At this time will stop IV octreotide and PPI, once a day oral Protonix.  H&H remained stable and he is symptom-free, stools have been brown, will be discharged home with once a day PPI, continue oral iron supplementation, continue propanolol.  Will continue Vantin for 3 more days to treat SBP prophylaxis.  Outpatient PCP and GI follow-up post discharge.    Thrombocytopenia:  Due to NASH no acute issues.  Liver  cirrhosis - NASH: See #1 above.  Hypertension: Blood pressure stable with soft-continue to hold all antihypertensives  DM-2: CBG stable continue home regimen upon discharge.  CBG (last 3)  Recent Labs    10/10/18 0028 10/10/18 0421 10/10/18 0813  GLUCAP 157* 157* 139*   Lab Results  Component Value Date   HGBA1C 7.4 (H) 10/08/2018     Discharge diagnosis     Principal Problem:   Acute upper GI bleed Active Problems:   Cirrhosis ([place])   Hypertension   Diabetes mellitus without complication ([place])   Esophageal varices without bleeding ([place])   Portal hypertensive gastropathy [facility])    Discharge instructions    Discharge Instructions    Discharge instructions   Complete by:  As directed    Follow with Primary MD [name], NP and your GI MD in 7 days   Get CBC, CMP, Iron Panel, B12, Folate  -  checked  by Primary MD in 5-7 days    Activity: As tolerated with Full fall precautions use walker/cane & assistance as needed  Disposition Home    Diet: Heart Healthy Low Carb, check CBGs QA CHS  Special Instructions: If you have smoked or chewed Tobacco  in the last 2 yrs please stop smoking, stop any regular Alcohol  and or any Recreational drug use.  On your next visit with your primary care physician please Get Medicines reviewed and adjusted.  Please request your Prim.MD to go over all Hospital Tests and Procedure/Radiological results at the follow up, please get all Hospital records sent to your Prim MD by signing hospital release before you go home.  If you experience worsening of your admission symptoms, develop shortness of breath, life threatening emergency, suicidal or homicidal thoughts you must seek medical attention immediately by calling 911 or calling your MD immediately  if symptoms less severe.  You Must read complete instructions/literature along with all the possible adverse reactions/side effects for all the Medicines you take and that have been  prescribed to you. Take any new Medicines after you have completely understood and accpet all the possible adverse reactions/side effects.   Increase activity slowly   Complete by:  As directed       Discharge Medications   Allergies as of 10/10/2018      Reactions   Nadolol Nausea Only      Medication List    TAKE these medications   cefpodoxime 200 MG tablet Commonly known as:  VANTIN Take 1 tablet (200 mg total) by mouth 2 (two) times daily.   cholecalciferol 1000 units tablet Commonly known as:  VITAMIN D Take 1,000 Units by mouth 2 (two) times daily.   FLAX OIL-FISH OIL-BORAGE OIL PO Take 1 tablet by mouth daily.   glipiZIDE 10 MG tablet Commonly known as:  GLUCOTROL Take 10 mg by mouth 2 (two) times daily.   Iron 325 (65 Fe) MG Tabs Take 1 tablet (325 mg total) by mouth 2 (two) times daily.   lisinopril 20 MG tablet Commonly known as:  PRINIVIL,ZESTRIL Take 20 mg by mouth 2 (two) times daily.   metFORMIN 1000 MG tablet Commonly known as:  GLUCOPHAGE Take 1,000 mg by mouth 2 (two) times daily with a meal.   pantoprazole 40 MG tablet Commonly known as:  PROTONIX Take 1 tablet (40 mg total) by mouth daily. Start taking on:  10/11/2018   PROPRANOLOL HCL PO Take 1 tablet by mouth 2 (two) times daily.       Follow-up Information    [name], NP. Schedule an appointment as soon as possible for a visit in 1 week(s).   Contact information: [place] [address] [phone]        Emanuel. Schedule an appointment as soon as possible for a visit in 1 week(s).        [facility] .   Specialty:  Gastroenterology Contact information: [address] [place] [address] [phone]          Major procedures and Radiology Reports - PLEASE review detailed and final reports thoroughly  -       No results found.  Micro Results     No results found for this or any previous  visit (from the past 240 hour(s)).  Today   Subjective    Martin Strong today has no headache,no chest abdominal pain,no new weakness tingling or numbness, feels  much better wants to go home today.     Objective   Blood pressure 138/67, pulse 98, temperature 98.8 F (37.1 C), temperature source Oral, resp. rate 16, height 6' (1.829 m), weight 107.5 kg, SpO2 97 %.   Intake/Output Summary (Last 24 hours) at 10/10/2018 0848 Last data filed at 10/09/2018 1459 Gross per 24 hour  Intake 660 ml  Output -  Net 660 ml    Exam  Awake Alert, Oriented x 3, No new F.N deficits, Normal affect [place].AT,PERRAL Supple Neck,No JVD, No cervical lymphadenopathy appriciated.  Symmetrical Chest wall movement, Good air movement bilaterally, CTAB RRR,No Gallops,Rubs or new Murmurs, No Parasternal Heave +ve B.Sounds, Abd Soft, Non tender, No organomegaly appriciated, No rebound -guarding or rigidity. No Cyanosis, Clubbing or edema, No new Rash or bruise   Data Review   CBC w Diff:  Lab Results  Component Value Date   WBC 4.7 10/10/2018   HGB 7.7 (L) 10/10/2018   HCT 23.0 (L) 10/10/2018   PLT 55 (L) 10/10/2018    CMP:  Lab Results  Component Value Date   NA 137 10/08/2018   K 3.9 10/08/2018   CL 106 10/08/2018   CO2 23 10/08/2018   BUN 21 (H) 10/08/2018   CREATININE 0.87 10/08/2018   PROT 5.6 (L) 10/08/2018   ALBUMIN 3.1 (L) 10/08/2018   BILITOT 1.2 10/08/2018   ALKPHOS 45 10/08/2018   AST 52 (H) 10/08/2018   ALT 48 (H) 10/08/2018  .   Total Time in preparing paper work, data evaluation and todays exam - 73 minutes  Lala Lund M.D on 10/10/2018 at 8:48 AM  [facility]   Office  [phone]

## 2018-10-13 ENCOUNTER — Telehealth: Payer: Self-pay

## 2018-10-13 NOTE — Telephone Encounter (Signed)
I spoke with the patient and he will come for OV on 10/25/18.  He will go by the [facility] office to sign a release for [facility].  Herma Mering, CMA notified he will come tomorrow.  He is advised that follow up EGD will be arranged at the 10/25/18 appt with Dr. Bryan Lemma

## 2018-10-13 NOTE — Telephone Encounter (Signed)
-----   Message from Greggory Keen, LPN sent at 01/75/1025 10:24 AM EDT -----   ----- Message ----- From: Willia Craze, NP Sent: 10/08/2018  10:26 AM EDT To: Greggory Keen, LPN, [place], please forward to [place]. Patient needs office follow up in 3 weeks or so. He will need egd in ~ 8 weeks which we will arrange at office follow up. He is new patient to [place], saw in hospital. At some point we will need to get records from his GI in [place]. Maybe nurse can call patient in a week or so to get consent signed to get records so they will be available at time of office visit. Thanks

## 2018-10-25 ENCOUNTER — Encounter: Payer: Self-pay | Admitting: Gastroenterology

## 2018-10-25 ENCOUNTER — Ambulatory Visit: Payer: BLUE CROSS/BLUE SHIELD | Admitting: Gastroenterology

## 2018-10-25 VITALS — BP 122/70 | HR 66 | Ht 73.0 in | Wt 237.0 lb

## 2018-10-25 DIAGNOSIS — I8500 Esophageal varices without bleeding: Secondary | ICD-10-CM | POA: Diagnosis not present

## 2018-10-25 DIAGNOSIS — K7460 Unspecified cirrhosis of liver: Secondary | ICD-10-CM

## 2018-10-25 DIAGNOSIS — D509 Iron deficiency anemia, unspecified: Secondary | ICD-10-CM | POA: Diagnosis not present

## 2018-10-25 DIAGNOSIS — R188 Other ascites: Secondary | ICD-10-CM

## 2018-10-25 NOTE — Progress Notes (Signed)
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  Was previously followed by GI and [place], [place].  Cirrhosis complicated by esophageal varices with bleeding requiring multiple EGDs with banding in the past along with propranolol for dual prophylaxis.  Otherwise no history of ascites, hepatic encephalopathy.  HPI:     Patient is a 52 y.o. male presenting to the Gastroenterology Clinic for follow-up.  He was recently admitted to [facility] in October 2019 for acute variceal bleed, treated with EGD with EVL x2 bands.  Since then, he has returned to his usual state of health and denies any recent melena, abdominal pain, nausea, vomiting.  Has resumed good p.o. intake and still adheres to low-sodium diet.  Otherwise, no acute issues.  Recent AFP normal, but still requires imaging to complete [place] screening.  Cirrhosis Hx: - Etiology: NASH - Diagnosed 2017 - Liver bx: - Complications: EV with admission for bleeding 09/2018 treated with EGD with EVL - Rx: Propranolol (dual prophy) - HCC Screening:  AFP <0.9. Will order MRI liver for [place] screening today - MELD: 10 - Child Pugh: A (6 pts)  Endoscopic history: - EGD x3 in 2017 in [place], [place] for esophageal varices with banding - EGD (09/2018, Dr. Bryan Lemma): Grade 2 esophageal varices with stigmata of recent bleeding requiring banding x2 with complete eradication, portal hypertensive gastropathy without bleeding.  Review of systems:     No chest pain, no SOB, no fevers, no urinary sx   Past Medical History:  Diagnosis Date  . Cirrhosis ([place])   . Diabetes mellitus without complication ([place])   . Fatty liver   . GI bleed   . Hypertension     Patient's surgical history, family medical history, social  history, medications and allergies were all reviewed in Epic    Current Outpatient Medications  Medication Sig Dispense Refill  . cefpodoxime (VANTIN) 200 MG tablet Take 1 tablet (200 mg total) by mouth 2 (two) times daily. 6 tablet 0  . cholecalciferol (VITAMIN D) 1000 units tablet Take 1,000 Units by mouth 2 (two) times daily.    . Ferrous Sulfate (IRON) 325 (65 Fe) MG TABS Take 1 tablet (325 mg total) by mouth 2 (two) times daily. 60 each 0  . FLAX OIL-FISH OIL-BORAGE OIL PO Take 1 tablet by mouth daily.    Marland Kitchen glipiZIDE (GLUCOTROL) 10 MG tablet Take 10 mg by mouth 2 (two) times daily.    Marland Kitchen lisinopril (PRINIVIL,ZESTRIL) 20 MG tablet Take 20 mg by mouth 2 (two) times daily.    . metFORMIN (GLUCOPHAGE) 1000 MG tablet Take 1,000 mg by mouth 2 (two) times daily with a meal.    . pantoprazole (PROTONIX) 40 MG tablet Take 1 tablet (40 mg total) by mouth daily. 30 tablet 0  . PROPRANOLOL HCL PO Take 1 tablet by mouth 2 (two) times daily.     No current facility-administered medications for this visit.     Physical Exam:     There were no vitals taken for this visit.  GENERAL:  Pleasant male in NAD PSYCH: : Cooperative, normal affect EENT:  conjunctiva pink, mucous membranes moist, neck supple without masses CARDIAC:  RRR, no murmur heard, no peripheral edema PULM: Normal respiratory effort, lungs CTA bilaterally, no wheezing ABDOMEN:  Nondistended, soft, nontender. No obvious masses, no hepatomegaly,  normal bowel sounds SKIN:  turgor, no lesions seen Musculoskeletal:  Normal muscle tone, normal strength NEURO: Alert and oriented x 3, no focal neurologic deficits   IMPRESSION and PLAN:    #1. NASH Cirrhosis: History of NASH cirrhosis with decompensation as manifested by bleeding esophageal varices.  Otherwise relatively low MELD and Child Pugh A.   - Repeat EGD with possible EVL to confirm eradicaiton and ongoing dual therapy.  We will schedule 3 to 8 weeks from prior.  To schedule  at [place] propranolol - AFP <0.9. Will order MRI liver for HCC screening - Resume <2gm Na diet - No plan for referral to transplant evaluation given low MELD -Check viral hepatitis vaccination status on follow-up appointment  #2. Mild Iron reduction: Mildly reduced iron indices on recent admission.  Had been taking oral iron therapy and has continued to take without any medication ADRs.  - Resume PO iron - Repeat CBC and iron indices in 3 months - Return to clinic in 3 to 6 months or sooner as needed  The indications, risks, and benefits of EGD were explained to the patient in detail. Risks include but are not limited to bleeding, perforation, adverse reaction to medications, and cardiopulmonary compromise. Sequelae include but are not limited to the possibility of surgery, hositalization, and mortality. The patient verbalized understanding and wished to proceed. All questions answered, referred to scheduler. Further recommendations pending results of the exam.        Lavena Bullion ,DO, FACG 10/25/2018, 2:03 PM

## 2018-10-25 NOTE — Patient Instructions (Addendum)
If you are age 51 or older, your body mass index should be between 23-30. Your Body mass index is 31.27 kg/m. If this is out of the aforementioned range listed, please consider follow up with your Primary Care Provider.  If you are age 68 or younger, your body mass index should be between 19-25. Your Body mass index is 31.27 kg/m. If this is out of the aformentioned range listed, please consider follow up with your Primary Care Provider.   Please call our office at [phone] to set up your 3 months follow up visit. We will call you to schedule your Endoscopy at [place] in 3 months.  It was a pleasure to see you today!  Vito Cirigliano, D.O.

## 2018-11-17 ENCOUNTER — Telehealth: Payer: Self-pay | Admitting: Gastroenterology

## 2018-11-17 DIAGNOSIS — I8500 Esophageal varices without bleeding: Secondary | ICD-10-CM

## 2018-11-17 NOTE — Telephone Encounter (Signed)
No problem. Go ahead and schedule for EGD with EV banding at [facility] for my next hospital date.

## 2018-11-17 NOTE — Telephone Encounter (Signed)
Dr., Please see previous message-Per your last OV note on this patient- please advise

## 2018-11-18 ENCOUNTER — Other Ambulatory Visit: Payer: Self-pay

## 2018-11-18 DIAGNOSIS — I8500 Esophageal varices without bleeding: Secondary | ICD-10-CM

## 2018-11-18 NOTE — Telephone Encounter (Signed)
Left message for patient to call back; patient to be informed of appt; Patient is scheduled for egd on 01/03/19 procedure at 8:30 am; Pre visit on 12/22/18 arrival at 9:45 am for 10:00 am appt; amb ref order placed; hospital orders in Epic;

## 2018-11-22 NOTE — Telephone Encounter (Signed)
Patient returned call to office-patient informed of pre visit date/time and procedure date/time; patient verbalized understanding of information/instructions; patient advised to call back if questions/concerns arise;

## 2018-12-22 ENCOUNTER — Telehealth: Payer: Self-pay

## 2018-12-22 NOTE — Telephone Encounter (Signed)
LM to call to RS previsit. NS today 1/9 1000 am. EGD 1/21

## 2018-12-26 ENCOUNTER — Encounter: Payer: Self-pay | Admitting: Gastroenterology

## 2018-12-27 ENCOUNTER — Ambulatory Visit (AMBULATORY_SURGERY_CENTER): Payer: Self-pay

## 2018-12-27 VITALS — Ht 73.0 in | Wt 237.0 lb

## 2018-12-27 DIAGNOSIS — K7460 Unspecified cirrhosis of liver: Secondary | ICD-10-CM

## 2018-12-27 DIAGNOSIS — R188 Other ascites: Secondary | ICD-10-CM

## 2018-12-27 NOTE — Progress Notes (Signed)
Denies allergies to eggs or soy products. Denies complication of anesthesia or sedation. Denies use of weight loss medication. Denies use of O2.   Emmi instructions declined.  

## 2019-01-03 ENCOUNTER — Encounter (HOSPITAL_COMMUNITY)
Admission: RE | Disposition: A | Discharge status: Home / Self Care | Payer: Self-pay | Source: Home / Self Care | Attending: Gastroenterology

## 2019-01-03 ENCOUNTER — Ambulatory Visit (HOSPITAL_COMMUNITY): Payer: BLUE CROSS/BLUE SHIELD | Admitting: Anesthesiology

## 2019-01-03 ENCOUNTER — Encounter (HOSPITAL_COMMUNITY): Payer: Self-pay | Admitting: *Deleted

## 2019-01-03 ENCOUNTER — Ambulatory Visit (HOSPITAL_COMMUNITY)
Admission: RE | Admit: 2019-01-03 | Discharge: 2019-01-03 | Disposition: A | Discharge status: Home / Self Care | Payer: BLUE CROSS/BLUE SHIELD | Attending: Gastroenterology | Admitting: Gastroenterology

## 2019-01-03 ENCOUNTER — Other Ambulatory Visit: Payer: Self-pay

## 2019-01-03 DIAGNOSIS — I8500 Esophageal varices without bleeding: Secondary | ICD-10-CM

## 2019-01-03 DIAGNOSIS — M1990 Unspecified osteoarthritis, unspecified site: Secondary | ICD-10-CM | POA: Insufficient documentation

## 2019-01-03 DIAGNOSIS — I864 Gastric varices: Secondary | ICD-10-CM | POA: Diagnosis not present

## 2019-01-03 DIAGNOSIS — K222 Esophageal obstruction: Secondary | ICD-10-CM | POA: Diagnosis not present

## 2019-01-03 DIAGNOSIS — E119 Type 2 diabetes mellitus without complications: Secondary | ICD-10-CM | POA: Insufficient documentation

## 2019-01-03 DIAGNOSIS — K219 Gastro-esophageal reflux disease without esophagitis: Secondary | ICD-10-CM | POA: Insufficient documentation

## 2019-01-03 DIAGNOSIS — K3189 Other diseases of stomach and duodenum: Secondary | ICD-10-CM | POA: Diagnosis not present

## 2019-01-03 DIAGNOSIS — Z7984 Long term (current) use of oral hypoglycemic drugs: Secondary | ICD-10-CM | POA: Insufficient documentation

## 2019-01-03 DIAGNOSIS — I10 Essential (primary) hypertension: Secondary | ICD-10-CM | POA: Insufficient documentation

## 2019-01-03 DIAGNOSIS — I8511 Secondary esophageal varices with bleeding: Secondary | ICD-10-CM | POA: Diagnosis not present

## 2019-01-03 DIAGNOSIS — K7581 Nonalcoholic steatohepatitis (NASH): Secondary | ICD-10-CM | POA: Insufficient documentation

## 2019-01-03 DIAGNOSIS — I8510 Secondary esophageal varices without bleeding: Secondary | ICD-10-CM | POA: Diagnosis present

## 2019-01-03 DIAGNOSIS — Z79899 Other long term (current) drug therapy: Secondary | ICD-10-CM | POA: Insufficient documentation

## 2019-01-03 DIAGNOSIS — K766 Portal hypertension: Secondary | ICD-10-CM

## 2019-01-03 HISTORY — PX: ESOPHAGOGASTRODUODENOSCOPY (EGD) WITH PROPOFOL: SHX5813

## 2019-01-03 HISTORY — PX: ESOPHAGEAL BANDING: SHX5518

## 2019-01-03 LAB — CBC
HCT: 37.1 % — ABNORMAL LOW (ref 39.0–52.0)
Hemoglobin: 12.1 g/dL — ABNORMAL LOW (ref 13.0–17.0)
MCH: 31.5 pg (ref 26.0–34.0)
MCHC: 32.6 g/dL (ref 30.0–36.0)
MCV: 96.6 fL (ref 80.0–100.0)
Platelets: 54 10*3/uL — ABNORMAL LOW (ref 150–400)
RBC: 3.84 MIL/uL — ABNORMAL LOW (ref 4.22–5.81)
RDW: 14.5 % (ref 11.5–15.5)
WBC: 4.4 10*3/uL (ref 4.0–10.5)
nRBC: 0 % (ref 0.0–0.2)

## 2019-01-03 LAB — PROTIME-INR
INR: 1.18
Prothrombin Time: 14.9 seconds (ref 11.4–15.2)

## 2019-01-03 LAB — BASIC METABOLIC PANEL
Anion gap: 8 (ref 5–15)
BUN: 14 mg/dL (ref 6–20)
CO2: 24 mmol/L (ref 22–32)
Calcium: 9.1 mg/dL (ref 8.9–10.3)
Chloride: 108 mmol/L (ref 98–111)
Creatinine, Ser: 0.7 mg/dL (ref 0.61–1.24)
GFR calc Af Amer: >60 mL/min (ref >60)
GFR calc non Af Amer: >60 mL/min (ref >60)
Glucose, Bld: 121 mg/dL — ABNORMAL HIGH (ref 70–99)
Potassium: 4 mmol/L (ref 3.5–5.1)
Sodium: 140 mmol/L (ref 135–145)

## 2019-01-03 LAB — GLUCOSE, CAPILLARY: Glucose-Capillary: 121 mg/dL — ABNORMAL HIGH (ref 70–99)

## 2019-01-03 SURGERY — ESOPHAGOGASTRODUODENOSCOPY (EGD) WITH PROPOFOL
Anesthesia: Monitor Anesthesia Care

## 2019-01-03 MED ORDER — PHENYLEPHRINE HCL 10 MG/ML IJ SOLN
INTRAMUSCULAR | Status: DC | PRN
  Administered 2019-01-03: 40 ug via INTRAVENOUS
  Administered 2019-01-03: 80 ug via INTRAVENOUS

## 2019-01-03 MED ORDER — ONDANSETRON HCL 4 MG/2ML IJ SOLN
INTRAMUSCULAR | Status: DC | PRN
  Administered 2019-01-03: 4 mg via INTRAVENOUS

## 2019-01-03 MED ORDER — MIDAZOLAM HCL 5 MG/5ML IJ SOLN
INTRAMUSCULAR | Status: DC | PRN
  Administered 2019-01-03: 2 mg via INTRAVENOUS

## 2019-01-03 MED ORDER — MIDAZOLAM HCL 2 MG/2ML IJ SOLN
INTRAMUSCULAR | Status: AC
  Filled 2019-01-03: qty 2

## 2019-01-03 MED ORDER — SODIUM CHLORIDE 0.9 % IV SOLN: INTRAVENOUS | Status: DC

## 2019-01-03 MED ORDER — PROPOFOL 500 MG/50ML IV EMUL
INTRAVENOUS | Status: DC | PRN
  Administered 2019-01-03: 125 ug/kg/min via INTRAVENOUS

## 2019-01-03 MED ORDER — PROPOFOL 10 MG/ML IV BOLUS
INTRAVENOUS | Status: AC
  Filled 2019-01-03: qty 20

## 2019-01-03 MED ORDER — ONDANSETRON HCL 4 MG/2ML IJ SOLN: 4.0000 mg | Freq: Once | INTRAMUSCULAR | Status: DC | PRN

## 2019-01-03 MED ORDER — LIDOCAINE VISCOUS HCL 2 % MT SOLN
OROMUCOSAL | Status: AC
  Filled 2019-01-03: qty 15

## 2019-01-03 MED ORDER — LACTATED RINGERS IV SOLN
INTRAVENOUS | Status: DC
  Administered 2019-01-03: 08:00:00 via INTRAVENOUS

## 2019-01-03 MED ORDER — LIDOCAINE VISCOUS HCL 2 % MT SOLN
15.0000 mL | Freq: Once | OROMUCOSAL | Status: AC
  Administered 2019-01-03: 15 mL via OROMUCOSAL

## 2019-01-03 MED ORDER — FENTANYL CITRATE (PF) 100 MCG/2ML IJ SOLN: 25.0000 ug | INTRAMUSCULAR | Status: DC | PRN

## 2019-01-03 MED ORDER — PROPOFOL 500 MG/50ML IV EMUL
INTRAVENOUS | Status: DC | PRN
  Administered 2019-01-03: 10 mg via INTRAVENOUS
  Administered 2019-01-03: 50 mg via INTRAVENOUS
  Administered 2019-01-03: 10 mg via INTRAVENOUS

## 2019-01-03 SURGICAL SUPPLY — 15 items

## 2019-01-03 NOTE — Anesthesia Preprocedure Evaluation (Signed)
Anesthesia Evaluation  Patient identified by MRN, date of birth, ID band Patient awake    Reviewed: Allergy & Precautions, NPO status , Patient's Chart, lab work & pertinent test results, reviewed documented beta blocker date and time   Airway Mallampati: II  TM Distance: >3 FB Neck ROM: Full    Dental  (+) Dental Advisory Given, Missing,    Pulmonary neg pulmonary ROS,    Pulmonary exam normal breath sounds clear to auscultation       Cardiovascular hypertension, Pt. on home beta blockers and Pt. on medications Normal cardiovascular exam Rhythm:Regular Rate:Normal     Neuro/Psych negative neurological ROS     GI/Hepatic GERD  ,(+) Cirrhosis   Esophageal Varices    , esophageal varices with bleeding   Endo/Other  diabetes, Type 2, Oral Hypoglycemic AgentsObesity   Renal/GU negative Renal ROS     Musculoskeletal  (+) Arthritis ,   Abdominal   Peds  Hematology  (+) Blood dyscrasia, anemia ,   Anesthesia Other Findings Day of surgery medications reviewed with the patient.  Reproductive/Obstetrics                             Anesthesia Physical Anesthesia Plan  ASA: III  Anesthesia Plan: MAC   Post-op Pain Management:    Induction: Intravenous  PONV Risk Score and Plan: 1 and Propofol infusion and Treatment may vary due to age or medical condition  Airway Management Planned: Nasal Cannula and Natural Airway  Additional Equipment:   Intra-op Plan:   Post-operative Plan:   Informed Consent: I have reviewed the patients History and Physical, chart, labs and discussed the procedure including the risks, benefits and alternatives for the proposed anesthesia with the patient or authorized representative who has indicated his/her understanding and acceptance.     Dental advisory given  Plan Discussed with: CRNA and Anesthesiologist  Anesthesia Plan Comments:          Anesthesia Quick Evaluation

## 2019-01-03 NOTE — H&P (Signed)
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  Was previously followed by GI and [place], [place].  Cirrhosis complicated by esophageal varices with bleeding requiring multiple EGDs with banding in the past along with propranolol for dual prophylaxis. Otherwise no history of ascites, hepatic encephalopathy.  He was last seen by me on 10/25/2018 as an outpatient and doing well at that time.  He presents today for repeat EGD for variceal evaluation and treatment as appropriate.  HPI:     Patient is a 53 y.o. male presenting for repeat EGD for esophageal variceal ligation for dual therapy.  He is otherwise in his usual state of health and taking all medications as prescribed.  No recent admissions for bleeding and otherwise feels well.  MRI previously ordered for [facility] screening but not yet completed.  Otherwise AFP was negative in the fall 2019.  Cirrhosis Hx: - Etiology: NASH - Diagnosed 2017 - Liver bx: - Complications: EV with admission for bleeding 09/2018 treated with EGD with EVL - Rx: Propranolol (dual prophy) - HCC Screening:  AFP <0.9.  Previously ordered MRI liver for [place] screening - MELD: 10 - Child Pugh: A (6 pts)  Endoscopic history: - EGD x3 in 2017 in [place], [place] for esophageal varices with banding - EGD (09/2018, Dr. Bryan Lemma): Grade 2 esophageal varices with stigmata of recent bleeding requiring banding x2 with complete eradication, portal hypertensive gastropathy without bleeding.  Review of systems:     No chest pain, no SOB, no fevers, no urinary sx   Past Medical History:  Diagnosis Date  . Anemia   . Arthritis   . Cirrhosis ([place])   . Diabetes mellitus without complication ([place])   . Fatty liver   . GERD (gastroesophageal  reflux disease)   . GI bleed   . Hyperlipidemia   . Hypertension     Patient's surgical history, family medical history, social history, medications and allergies were all reviewed in Epic    Current Facility-Administered Medications  Medication Dose Route Frequency Provider Last Rate Last Dose  . lactated ringers infusion   Intravenous Continuous Cloma Rahrig V, DO 20 mL/hr at 01/03/19 0757      Physical Exam:     BP (!) 145/76   Pulse 82   Temp 98.5 F (36.9 C) (Oral)   Resp 13   Ht 6' 1"  (1.854 m)   Wt 107.5 kg   SpO2 99%   BMI 31.27 kg/m   GENERAL:  Pleasant male in NAD PSYCH: : Cooperative, normal affect EENT:  conjunctiva pink, mucous membranes moist, neck supple without masses CARDIAC:  RRR, no murmur heard, no peripheral edema PULM: Normal respiratory effort, lungs CTA bilaterally, no wheezing ABDOMEN:  Nondistended, soft, nontender. No obvious masses, no hepatomegaly,  normal bowel sounds SKIN:  turgor, no lesions seen Musculoskeletal:  Normal muscle tone, normal strength NEURO: Alert and oriented x 3, no focal neurologic deficits   IMPRESSION and PLAN:    #1. NASH Cirrhosis: History of NASH cirrhosis with decompensation as manifested by bleeding esophageal varices requiring dual therapy with propranolol and serial banding in the past.  Admitted in October 2019 with recurrent esophageal variceal bleeding requiring EBL x2.  Presents today for repeat EGD.  Otherwise relatively low MELD and Child Pugh A.   -  Repeat EGD with possible EVL to confirm eradicaiton and ongoing dual therapy today.   - No plan for referral to transplant evaluation at this time given low MELD - Check viral hepatitis vaccination status on follow-up appointment -Additional recommendations pending endoscopic findings.  Discussed the risk benefits and alternatives of EGD to include EVL and patient wishes to proceed.       [place] ,DO, FACG 01/03/2019, 8:00 AM

## 2019-01-03 NOTE — Discharge Instructions (Signed)

## 2019-01-03 NOTE — Op Note (Signed)
[facility] Patient Name: Martin Strong Procedure Date: 01/03/2019 MRN: [id number] Attending MD: Gerrit Heck , MD Date of Birth: Apr 14, 1966 CSN: [id number] Age: 53 Admit Type: Outpatient Procedure:                Upper GI endoscopy Indications:              Follow-up of esophageal varices Providers:                Gerrit Heck, MD, Cleda Daub, RN, Charolette Child, Technician, Arnoldo Hooker, CRNA Referring MD:              Medicines:                Monitored Anesthesia Care Complications:            No immediate complications. Estimated Blood Loss:     Estimated blood loss was minimal. Procedure:                Pre-Anesthesia Assessment:                           - Prior to the procedure, a History and Physical                            was performed, and patient medications and                            allergies were reviewed. The patient's tolerance of                            previous anesthesia was also reviewed. The risks                            and benefits of the procedure and the sedation                            options and risks were discussed with the patient.                            All questions were answered, and informed consent                            was obtained. Prior Anticoagulants: The patient has                            taken no previous anticoagulant or antiplatelet                            agents. ASA Grade Assessment: III - A patient with                            severe systemic disease. After reviewing the risks  and benefits, the patient was deemed in                            satisfactory condition to undergo the procedure.                           After obtaining informed consent, the endoscope was                            passed under direct vision. Throughout the                            procedure, the patient's blood pressure, pulse, and               oxygen saturations were monitored continuously. The                            GIF-H190 ([id number] Olympus gastroscope was                            introduced through the mouth, and advanced to the                            second part of duodenum. The upper GI endoscopy was                            accomplished without difficulty. The patient                            tolerated the procedure well. Scope In: Scope Out: Findings:      Grade II varices were found in the middle third of the esophagus and in       the lower third of the esophagus. They were 5 mm in largest diameter.       Six bands were successfully placed with complete eradication, resulting       in deflation of varices. The highest band was placed 41 cm from the       incisors. There was no bleeding during and at the end of the procedure.      Esophagogastric landmarks were identified: the Z-line was found at 46       cm, the gastroesophageal junction was found at 46 cm and the site of       hiatal narrowing was found at 46 cm from the incisors.      Severe portal hypertensive gastropathy was found in the entire examined       stomach. There was mild scattered contact oozing. Given the extent and       contact oozing, mucosal biopsies were not obtained for H pylori       assessment in favor of serologic evaluation, treatment with high dose       acid suppression, and re-evaluation as outlined below.      Small type 1 gastroesophageal varices (GOV1, esophageal varices which       extend along the lesser curvature) with no bleeding were found in the       cardia. There were no stigmata of recent bleeding.  The duodenal bulb, first portion of the duodenum and second portion of       the duodenum were normal. Impression:               - Grade II esophageal varices. Completely                            eradicated. Banded with 6 bands.                           - Esophagogastric landmarks identified.                            - Portal hypertensive gastropathy with mild, self                            limiting, contact oozing.                           - Type 1 gastroesophageal varices (GOV1, esophageal                            varices which extend along the lesser curvature),                            without bleeding.                           - Normal duodenal bulb, first portion of the                            duodenum and second portion of the duodenum.                           - No specimens collected. Moderate Sedation:      Not Applicable - Patient had care per Anesthesia. Recommendation:           - Patient has a contact number available for                            emergencies. The signs and symptoms of potential                            delayed complications were discussed with the                            patient. Return to normal activities tomorrow.                            Written discharge instructions were provided to the                            patient.                           - Soft diet today.                           -  Continue present medications.                           - Repeat upper endoscopy in 3-8 weeks for                            retreatment.                           - Return to GI clinic at appointment to be                            scheduled.                           - Use Protonix (pantoprazole) 40 mg PO BID for 8                            weeks.                           - Perform an H. pylori serology at the next                            available appointment.                           - Obtain previously ordered labs, to include CBC,                            INR, CMP. Procedure Code(s):        --- Professional ---                           [phone], Esophagogastroduodenoscopy, flexible,                            transoral; with band ligation of esophageal/gastric                            varices Diagnosis Code(s):         --- Professional ---                           I85.00, Esophageal varices without bleeding                           K76.6, Portal hypertension                           K31.89, Other diseases of stomach and duodenum                           I86.4, Gastric varices CPT copyright 2018 American Medical Association. All rights reserved. The codes documented in this report are preliminary and upon coder review may  be revised to meet current compliance requirements. Gerrit Heck, MD 01/03/2019 9:34:06 AM Number of Addenda: 0

## 2019-01-03 NOTE — Transfer of Care (Signed)
Immediate Anesthesia Transfer of Care Note  Patient: Martin Strong  Procedure(s) Performed: ESOPHAGOGASTRODUODENOSCOPY (EGD) WITH PROPOFOL (N/A ) ESOPHAGEAL BANDING (N/A )  Patient Location: PACU and Endoscopy Unit  Anesthesia Type:MAC  Level of Consciousness: awake  Airway & Oxygen Therapy: Patient Spontanous Breathing and Patient connected to nasal cannula oxygen  Post-op Assessment: Report given to RN and Post -op Vital signs reviewed and stable  Post vital signs: Reviewed and stable  Last Vitals:  Vitals Value Taken Time  BP    Temp    Pulse 89 01/03/2019  9:25 AM  Resp 19 01/03/2019  9:25 AM  SpO2 100 % 01/03/2019  9:25 AM  Vitals shown include unvalidated device data.  Last Pain:  Vitals:   01/03/19 0735  TempSrc: Oral  PainSc: 0-No pain         Complications: No apparent anesthesia complications

## 2019-01-03 NOTE — Interval H&P Note (Signed)
History and Physical Interval Note:  01/03/2019 8:05 AM  Tell Martin Strong  has presented today for surgery, with the diagnosis of esophageal varices with bleeding  The various methods of treatment have been discussed with the patient and family. After consideration of risks, benefits and other options for treatment, the patient has consented to  Procedure(s): ESOPHAGOGASTRODUODENOSCOPY (EGD) WITH PROPOFOL (N/A) ESOPHAGEAL BANDING (N/A) as a surgical intervention .  The patient's history has been reviewed, patient examined, no change in status, stable for surgery.  I have reviewed the patient's chart and labs.  Questions were answered to the patient's satisfaction.     Dominic Pea Kevin Space

## 2019-01-03 NOTE — Anesthesia Postprocedure Evaluation (Signed)
Anesthesia Post Note  Patient: Martin Strong  Procedure(s) Performed: ESOPHAGOGASTRODUODENOSCOPY (EGD) WITH PROPOFOL (N/A ) ESOPHAGEAL BANDING (N/A )     Patient location during evaluation: Endoscopy Anesthesia Type: MAC Level of consciousness: awake and alert Pain management: pain level controlled Vital Signs Assessment: post-procedure vital signs reviewed and stable Respiratory status: spontaneous breathing, nonlabored ventilation, respiratory function stable and patient connected to nasal cannula oxygen Cardiovascular status: stable and blood pressure returned to baseline Postop Assessment: no apparent nausea or vomiting Anesthetic complications: no    Last Vitals:  Vitals:   01/03/19 1100 01/03/19 1130  BP: 115/65 111/69  Pulse: 69 77  Resp: 13 14  Temp:    SpO2: 94% 95%    Last Pain:  Vitals:   01/03/19 0927  TempSrc: Oral  PainSc: [place]

## 2019-01-04 ENCOUNTER — Encounter (HOSPITAL_COMMUNITY): Payer: Self-pay | Admitting: Gastroenterology

## 2019-01-04 LAB — H. PYLORI ANTIBODY, IGG: H Pylori IgG: 0.74 Index Value (ref 0.00–0.79)

## 2019-01-20 ENCOUNTER — Other Ambulatory Visit: Payer: Self-pay

## 2019-01-20 ENCOUNTER — Telehealth: Payer: Self-pay

## 2019-01-20 DIAGNOSIS — I8500 Esophageal varices without bleeding: Secondary | ICD-10-CM

## 2019-01-20 MED ORDER — PANTOPRAZOLE SODIUM 40 MG PO TBEC: 40.0000 mg | DELAYED_RELEASE_TABLET | Freq: Two times a day (BID) | ORAL | 0 refills | Status: DC

## 2019-01-20 NOTE — Telephone Encounter (Signed)
Instructions have been mailed to patient concerning prep;

## 2019-01-20 NOTE — Progress Notes (Signed)
Per procedure report from [facility] EGD on 01/03/2019 patient was to be on Protonix BID, RX stated daily-refill sent in for patient to have Protonix BID as per MD recommendations;   Patient informed of MD recommendations on repeating EGD for re-treatment of esophageal varices without bleeding; Patient has been scheduled on 02/14/2019 with appt time at [facility] for EGD at 8:30am;  Patient verbalized understanding of information/instructions; Patient was advised to call back if questions/concerns arise;

## 2019-01-20 NOTE — Telephone Encounter (Signed)
Left message for patient to call back to office;  Patient has been scheduled on 02/14/2019 at 8:30am for [facility] repeat EGD with Dr. Bryan Lemma;   Patient will need to be informed of appt date/time and instructions given/information provided;

## 2019-01-20 NOTE — Telephone Encounter (Signed)
Pt is returning your call

## 2019-01-20 NOTE — Telephone Encounter (Signed)
Please see additional documentation concerning this patient 

## 2019-01-25 ENCOUNTER — Encounter: Payer: Self-pay | Admitting: Gastroenterology

## 2019-01-25 ENCOUNTER — Ambulatory Visit (INDEPENDENT_AMBULATORY_CARE_PROVIDER_SITE_OTHER): Payer: BLUE CROSS/BLUE SHIELD | Admitting: Gastroenterology

## 2019-01-25 ENCOUNTER — Telehealth: Payer: Self-pay | Admitting: Gastroenterology

## 2019-01-25 VITALS — BP 128/72 | HR 76 | Ht 73.0 in | Wt 234.2 lb

## 2019-01-25 DIAGNOSIS — R1310 Dysphagia, unspecified: Secondary | ICD-10-CM

## 2019-01-25 DIAGNOSIS — K7460 Unspecified cirrhosis of liver: Secondary | ICD-10-CM

## 2019-01-25 DIAGNOSIS — I8500 Esophageal varices without bleeding: Secondary | ICD-10-CM | POA: Diagnosis not present

## 2019-01-25 DIAGNOSIS — D509 Iron deficiency anemia, unspecified: Secondary | ICD-10-CM

## 2019-01-25 NOTE — Progress Notes (Signed)
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  Was previously followed by GI and [place], [place].  Cirrhosis complicated by esophageal varices with bleeding requiring multiple EGDs with banding in the past along with propranolol for dual prophylaxis. Otherwise no history of ascites, hepatic encephalopathy. Last seen by me on 10/25/18 for hospital follow-up. Repeat EGD with 6 bands placed on 01/03/19, with plan for repeat EGD in March for ongoing banding for dual therapy/eradication.   HPI:     Patient is a 53 y.o. male presenting to the [facility]enterology Clinic for follow-up and evaluation of dysphagia. Was last seen by me in clinic 10/25/18, and at that time was in usual state of health, with repeat EGD with EVL x6 on 01/03/18. Additionally w/ severe portal hypertensive gastropathy, treatedw ith Protonix 40 mg PO BID x8 weeks.   Today, he states had acute onset dysphagia yesterday when eating tenderloin sandwich and french fries. Has had similar episodes x4 over the last couple of months, including 2 episodes prior to recent EGD with banding. Had 2 episodes with liquids alone. Sxs last up to 30 minutes. Minimal pain/discomfort in area, pointing to mid to lower sternum.  Recent EGD was otherwise without any areas of luminal narrowing, stricture, rings, etc.  Cirrhosis Hx: - Etiology: NASH - Diagnosed 2017 - Liver bx: - Complications: EV with admission for bleeding 09/2018 treated with EGD with EVL - Rx: Propranolol (dual prophy) - HCC Screening:  AFP <0.9. Will order MRI liver for [place] screening previously ordered, not yet done - MELD: 10 - Child Pugh: A (6 pts)  Endoscopic history: - EGD x3 in 2017 in [place], [place] for esophageal varices  with banding - EGD (09/2018, Dr. Bryan Lemma): Grade 2 esophageal varices with stigmata of recent bleeding requiring banding x2 with complete eradication, portal hypertensive gastropathy without bleeding. - EGD (12/2018, Dr. Bryan Lemma): Grade II esophageal varices, 6 bands placed with deflation. Normal Z line at 46 cm, severe portal HTN gastropathy with contact oozing, small GOV1, normal duodenum. H pylori serologies negative  Review of systems:     No chest pain, no SOB, no fevers, no urinary sx   Past Medical History:  Diagnosis Date  . Anemia   . Arthritis   . Cirrhosis ([place])   . Diabetes mellitus without complication ([place])   . Fatty liver   . GERD (gastroesophageal reflux disease)   . GI bleed   . Hyperlipidemia   . Hypertension     Patient's surgical history, family medical history, social history, medications and allergies were all reviewed in Epic    Current Outpatient Medications  Medication Sig Dispense Refill  . cholecalciferol (VITAMIN D) 1000 units tablet Take 1,000 Units by mouth 2 (two) times daily.    . dapagliflozin propanediol (FARXIGA) 10 MG TABS tablet Take 10 mg by mouth daily.    . Ferrous Sulfate (IRON) 325 (65 Fe) MG TABS Take 1 tablet (325 mg total) by mouth 2 (two) times daily. 60 each 0  . glipiZIDE (GLUCOTROL) 10 MG tablet Take 10 mg by mouth 2 (two) times daily.    Marland Kitchen lisinopril (PRINIVIL,ZESTRIL) 20 MG tablet Take 20 mg by mouth 2 (two) times daily.    . metFORMIN (GLUCOPHAGE) 1000 MG tablet  Take 1,000 mg by mouth 2 (two) times daily with a meal.    . Omega-3 Fatty Acids (FISH OIL) 1000 MG CAPS Take 1 capsule by mouth daily.    . pantoprazole (PROTONIX) 40 MG tablet Take 1 tablet (40 mg total) by mouth 2 (two) times daily. 60 tablet 0  . propranolol (INDERAL) 40 MG tablet Take 1 tablet by mouth 2 (two) times daily.      No current facility-administered medications for this visit.     Physical Exam:     BP 128/72   Pulse 76   Ht 6' 1"  (1.854 m)    Wt 234 lb 4 oz (106.3 kg)   BMI 30.91 kg/m   GENERAL:  Pleasant male in NAD PSYCH: : Cooperative, normal affect EENT:  conjunctiva pink, mucous membranes moist, neck supple without masses CARDIAC:  RRR, no murmur heard, no peripheral edema PULM: Normal respiratory effort, lungs CTA bilaterally, no wheezing ABDOMEN:  Nondistended, soft, nontender. No obvious masses, no hepatomegaly,  normal bowel sounds SKIN:  turgor, no lesions seen Musculoskeletal:  Normal muscle tone, normal strength NEURO: Alert and oriented x 3, no focal neurologic deficits   IMPRESSION and PLAN:    #1.  Dysphagia: Relatively new onset dysphagia to both solids and liquids.  Occurs intermittently and without provoking factors.  Unclear etiology as recent EGD was otherwise without luminal narrowing, stricture, rings, etc.  Discussed DDX to include motility disorder, achalasia and will evaluate further as below:  - Barium esophagram to evaluate for motility disorder, E GJ outflow issue, achalasia, etc. - Held off on Esophageal Manometry given history of esophageal varices with recent banding.  Would likely reserve this kind of study till completion of EVL -Advised patient to cut food into small pieces, eat small bites, chew food thoroughly and with plenty of liquids to avoid food impaction.     #2.  NASH Cirrhosis: History of NASH cirrhosis with decompensation as manifested by bleeding esophageal varices.  Otherwise relatively low MELD and Child Pugh A.   - Repeat EGD scheduled for 02/14/2019 for serial band ligation  - Resume propranolol - AFP <0.9.  Previously ordered MRI liver for [place] screening.  Not yet done - Resume <2gm Na diet - No plan for referral to transplant evaluation given low MELD - Check viral hepatitis vaccination status on follow-up appointment  #3. Mild Iron reduction: Mildly reduced iron indices on recent admission.  Had been taking oral iron therapy and has continued to take without any  medication ADRs.  Recent H/H much improved at 12/37 (from 7.7/23).  - Resume PO iron - Repeat CBC and iron indices in 3 months  RTC after studies complete.  I spent a total of 15 minutes of face-to-face time with the patient. Greater than 50% of the time was spent counseling and coordinating care.        [place] ,DO, FACG 01/25/2019, 2:50 PM

## 2019-01-25 NOTE — Telephone Encounter (Signed)
Office appts available this afternoon or should the patient be scheduled for Friday?

## 2019-01-25 NOTE — Telephone Encounter (Signed)
Called and spoke with patient-patient reports feels like food and drink (not medications) get stuck or lodged where the doctor banded it-"feels like choking down there"; patient reports vomiting when trying to get the substance back up so I don't feel like I am choking" While the patient is eating, this episode causes patient to feel like he is choking and cannot catch his breath-currently has had 5 "episodes" -last night was the worst of them (patient considered calling EMS); Denies abdominal pain/fever/rectal pain or bleeding/constipation/diarrhea/vomiting any blood; Chest area is sore from trying to get the food back up (from yesterday); When patient saw previous MD-was informed his esophagus may need stretching due to swallowing issues at that time;   Please advise

## 2019-01-25 NOTE — Telephone Encounter (Signed)
Today is fine.  Thank you

## 2019-01-25 NOTE — H&P (View-Only) (Signed)
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  Was previously followed by GI and [place], [place].  Cirrhosis complicated by esophageal varices with bleeding requiring multiple EGDs with banding in the past along with propranolol for dual prophylaxis. Otherwise no history of ascites, hepatic encephalopathy. Last seen by me on 10/25/18 for hospital follow-up. Repeat EGD with 6 bands placed on 01/03/19, with plan for repeat EGD in March for ongoing banding for dual therapy/eradication.   HPI:     Patient is a 53 y.o. male presenting to the Gastroenterology Clinic for follow-up and evaluation of dysphagia. Was last seen by me in clinic 10/25/18, and at that time was in usual state of health, with repeat EGD with EVL x6 on 01/03/18. Additionally w/ severe portal hypertensive gastropathy, treatedw ith Protonix 40 mg PO BID x8 weeks.   Today, he states had acute onset dysphagia yesterday when eating tenderloin sandwich and french fries. Has had similar episodes x4 over the last couple of months, including 2 episodes prior to recent EGD with banding. Had 2 episodes with liquids alone. Sxs last up to 30 minutes. Minimal pain/discomfort in area, pointing to mid to lower sternum.  Recent EGD was otherwise without any areas of luminal narrowing, stricture, rings, etc.  Cirrhosis Hx: - Etiology: NASH - Diagnosed 2017 - Liver bx: - Complications: EV with admission for bleeding 09/2018 treated with EGD with EVL - Rx: Propranolol (dual prophy) - HCC Screening:  AFP <0.9. Will order MRI liver for [place] screening previously ordered, not yet done - MELD: 10 - Child Pugh: A (6 pts)  Endoscopic history: - EGD x3 in 2017 in [place], [place] for esophageal varices  with banding - EGD (09/2018, Dr. Bryan Lemma): Grade 2 esophageal varices with stigmata of recent bleeding requiring banding x2 with complete eradication, portal hypertensive gastropathy without bleeding. - EGD (12/2018, Dr. Bryan Lemma): Grade II esophageal varices, 6 bands placed with deflation. Normal Z line at 46 cm, severe portal HTN gastropathy with contact oozing, small GOV1, normal duodenum. H pylori serologies negative  Review of systems:     No chest pain, no SOB, no fevers, no urinary sx   Past Medical History:  Diagnosis Date  . Anemia   . Arthritis   . Cirrhosis ([place])   . Diabetes mellitus without complication ([place] )   . Fatty liver   . GERD (gastroesophageal reflux disease)   . GI bleed   . Hyperlipidemia   . Hypertension     Patient's surgical history, family medical history, social history, medications and allergies were all reviewed in Epic    Current Outpatient Medications  Medication Sig Dispense Refill  . cholecalciferol (VITAMIN D) 1000 units tablet Take 1,000 Units by mouth 2 (two) times daily.    . dapagliflozin propanediol (FARXIGA) 10 MG TABS tablet Take 10 mg by mouth daily.    . Ferrous Sulfate (IRON) 325 (65 Fe) MG TABS Take 1 tablet (325 mg total) by mouth 2 (two) times daily. 60 each 0  . glipiZIDE (GLUCOTROL) 10 MG tablet Take 10 mg by mouth 2 (two) times daily.    Marland Kitchen lisinopril (PRINIVIL,ZESTRIL) 20 MG tablet Take 20 mg by mouth 2 (two) times daily.    . metFORMIN (GLUCOPHAGE) 1000 MG tablet  Take 1,000 mg by mouth 2 (two) times daily with a meal.    . Omega-3 Fatty Acids (FISH OIL) 1000 MG CAPS Take 1 capsule by mouth daily.    . pantoprazole (PROTONIX) 40 MG tablet Take 1 tablet (40 mg total) by mouth 2 (two) times daily. 60 tablet 0  . propranolol (INDERAL) 40 MG tablet Take 1 tablet by mouth 2 (two) times daily.      No current facility-administered medications for this visit.     Physical Exam:     BP 128/72   Pulse 76   Ht 6' 1"  (1.854 m)    Wt 234 lb 4 oz (106.3 kg)   BMI 30.91 kg/m   GENERAL:  Pleasant male in NAD PSYCH: : Cooperative, normal affect EENT:  conjunctiva pink, mucous membranes moist, neck supple without masses CARDIAC:  RRR, no murmur heard, no peripheral edema PULM: Normal respiratory effort, lungs CTA bilaterally, no wheezing ABDOMEN:  Nondistended, soft, nontender. No obvious masses, no hepatomegaly,  normal bowel sounds SKIN:  turgor, no lesions seen Musculoskeletal:  Normal muscle tone, normal strength NEURO: Alert and oriented x 3, no focal neurologic deficits   IMPRESSION and PLAN:    #1.  Dysphagia: Relatively new onset dysphagia to both solids and liquids.  Occurs intermittently and without provoking factors.  Unclear etiology as recent EGD was otherwise without luminal narrowing, stricture, rings, etc.  Discussed DDX to include motility disorder, achalasia and will evaluate further as below:  - Barium esophagram to evaluate for motility disorder, E GJ outflow issue, achalasia, etc. - Held off on Esophageal Manometry given history of esophageal varices with recent banding.  Would likely reserve this kind of study till completion of EVL -Advised patient to cut food into small pieces, eat small bites, chew food thoroughly and with plenty of liquids to avoid food impaction.     #2.  NASH Cirrhosis: History of NASH cirrhosis with decompensation as manifested by bleeding esophageal varices.  Otherwise relatively low MELD and Child Pugh A.   - Repeat EGD scheduled for 02/14/2019 for serial band ligation  - Resume propranolol - AFP <0.9.  Previously ordered MRI liver for [place] screening.  Not yet done - Resume <2gm Na diet - No plan for referral to transplant evaluation given low MELD - Check viral hepatitis vaccination status on follow-up appointment  #3. Mild Iron reduction: Mildly reduced iron indices on recent admission.  Had been taking oral iron therapy and has continued to take without any  medication ADRs.  Recent H/H much improved at 12/37 (from 7.7/23).  - Resume PO iron - Repeat CBC and iron indices in 3 months  RTC after studies complete.  I spent a total of 15 minutes of face-to-face time with the patient. Greater than 50% of the time was spent counseling and coordinating care.        [place] ,DO, FACG 01/25/2019, 2:50 PM

## 2019-01-25 NOTE — Telephone Encounter (Signed)
Please review patient message and advise

## 2019-01-25 NOTE — Patient Instructions (Addendum)
  If you are age 52 or older, your body mass index should be between 23-30. Your Body mass index is 30.91 kg/m. If this is out of the aforementioned range listed, please consider follow up with your Primary Care Provider.  If you are age 70 or younger, your body mass index should be between 19-25. Your Body mass index is 30.91 kg/m. If this is out of the aformentioned range listed, please consider follow up with your Primary Care Provider.   You have been scheduled for a Barium Esophogram at [facility] (1st floor of the hospital) on 02/03/2019 at 10:30. Please arrive 15 minutes prior to your appointment for registration. Make certain not to have anything to eat or drink 3 hours prior to your test. If you need to reschedule for any reason, please contact radiology at [phone] to do so. __________________________________________________________________ A barium swallow is an examination that concentrates on views of the esophagus. This tends to be a double contrast exam (barium and two liquids which, when combined, create a gas to distend the wall of the oesophagus) or single contrast (non-ionic iodine based). The study is usually tailored to your symptoms so a good history is essential. Attention is paid during the study to the form, structure and configuration of the esophagus, looking for functional disorders (such as aspiration, dysphagia, achalasia, motility and reflux) EXAMINATION You may be asked to change into a gown, depending on the type of swallow being performed. A radiologist and radiographer will perform the procedure. The radiologist will advise you of the type of contrast selected for your procedure and direct you during the exam. You will be asked to stand, sit or lie in several different positions and to hold a small amount of fluid in your mouth before being asked to swallow while the imaging is performed .In some instances you may be asked to swallow barium coated  marshmallows to assess the motility of a solid food bolus. The exam can be recorded as a digital or video fluoroscopy procedure. POST PROCEDURE It will take 1-2 days for the barium to pass through your system. To facilitate this, it is important, unless otherwise directed, to increase your fluids for the next 24-48hrs and to resume your normal diet.  This test typically takes about 30 minutes to perform.    It was a pleasure to see you today!  Gerrit Heck, D.O. __________________________________________________________________________________

## 2019-01-25 NOTE — Telephone Encounter (Signed)
Pt informed of his appt at [facility].  Pt reported that he is having "another episode as discussed with Dr. Bryan Lemma"  and requested an appt ASAP.

## 2019-01-25 NOTE — Telephone Encounter (Signed)
Called and spoke with patient-patient informed the MD would like to see him in the office today-patient reports he cannot be at the office until about 3:00 pm today-patient scheduled per MD request;

## 2019-01-25 NOTE — Telephone Encounter (Signed)
Reviewed his previous EGD reports, and no areas of stricture or luminal narrowing. He should not have any symptoms related to bands being in place this far out from his prior EGD w/ banding. Otherwise, these symptoms could have multiple etiologies, and agree with plan for an expedited appt to see me in clinic. In the meantime, recommend soft food diet, continue current medications, and drink plenty of fluids with meals. Ok to schedule appt with me. If none available soon enough, ok to see one of the APPs and staff with me as appropriate. Thanks.

## 2019-02-03 ENCOUNTER — Ambulatory Visit (HOSPITAL_COMMUNITY): Payer: BLUE CROSS/BLUE SHIELD

## 2019-02-09 ENCOUNTER — Ambulatory Visit (HOSPITAL_COMMUNITY)
Admission: RE | Admit: 2019-02-09 | Discharge: 2019-02-09 | Disposition: A | Discharge status: Home / Self Care | Payer: BLUE CROSS/BLUE SHIELD | Source: Ambulatory Visit | Attending: Gastroenterology | Admitting: Gastroenterology

## 2019-02-09 DIAGNOSIS — I8500 Esophageal varices without bleeding: Secondary | ICD-10-CM

## 2019-02-09 DIAGNOSIS — K7460 Unspecified cirrhosis of liver: Secondary | ICD-10-CM

## 2019-02-09 DIAGNOSIS — R1310 Dysphagia, unspecified: Secondary | ICD-10-CM

## 2019-02-09 IMAGING — RF DG ESOPHAGUS
8 series · 16 of 24 positions shown · non-contrast
Comparison: None.

CLINICAL DATA: Dysphagia. Feels like food gets stuck in distal
esophagus.

EXAM:
ESOPHOGRAM / BARIUM SWALLOW / BARIUM TABLET STUDY
TECHNIQUE: Combined double contrast and single contrast examination performed
using effervescent crystals, thick barium liquid, and thin barium
liquid. The patient was observed with fluoroscopy swallowing a 13 mm
barium sulphate tablet.
FLUOROSCOPY TIME:  Fluoroscopy Time:  2.3 minutes
Radiation Exposure Index (if provided by the fluoroscopic device):
47.9 mGy

[Series 1: cp_standard · 0.34mm/px · 2 of 98 frames shown (1 of 8)]
[frame 15/98]
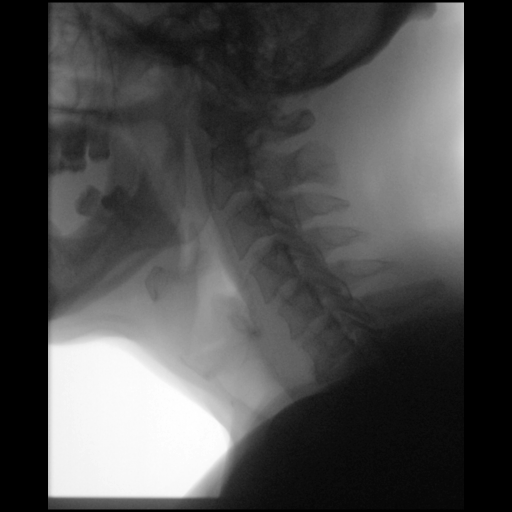
[frame 50/98]
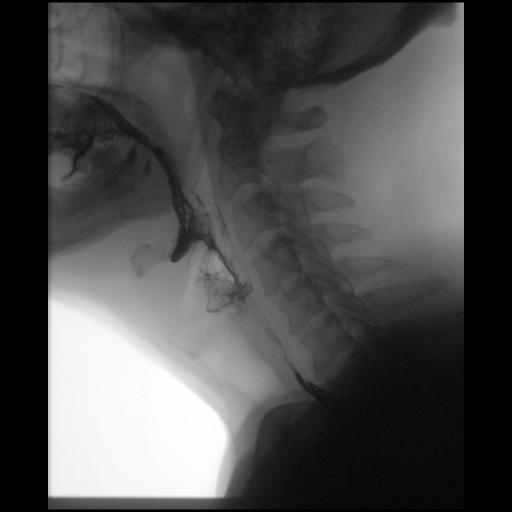

[Series 2: cp_standard · 0.17mm/px · 1 of 1 slices shown (2 of 8)]
[im 1/1]
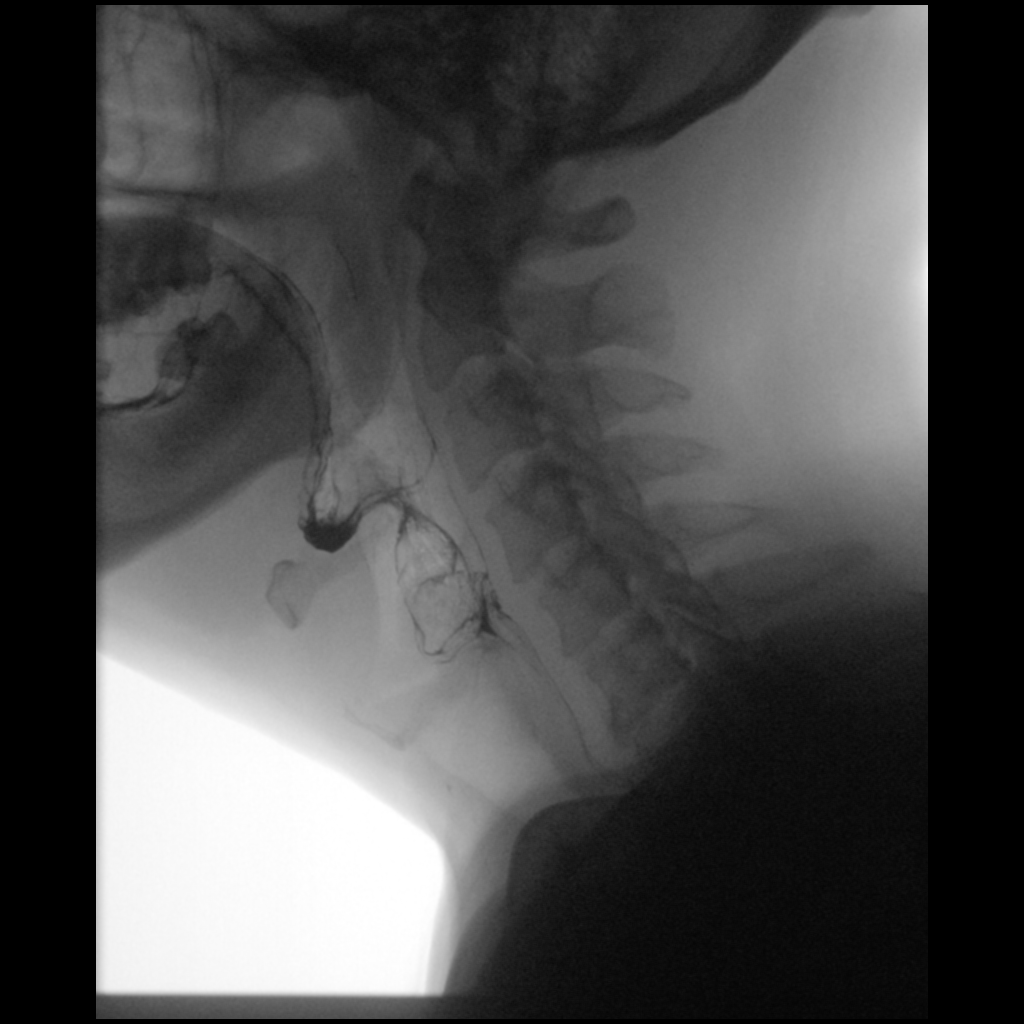

[Series 3: cp_standard · 0.34mm/px · 2 of 129 frames shown (3 of 8)]
[frame 30/129]
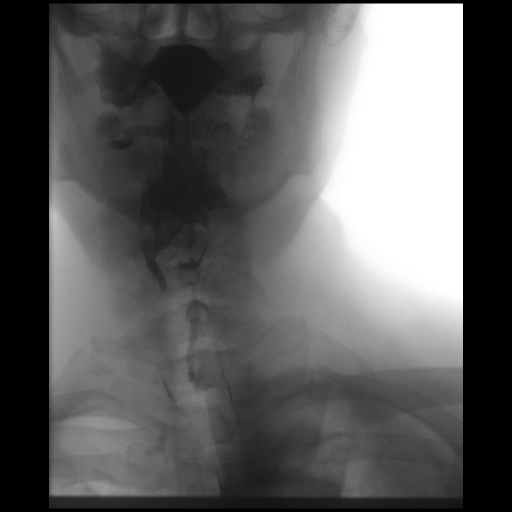
[frame 65/129]
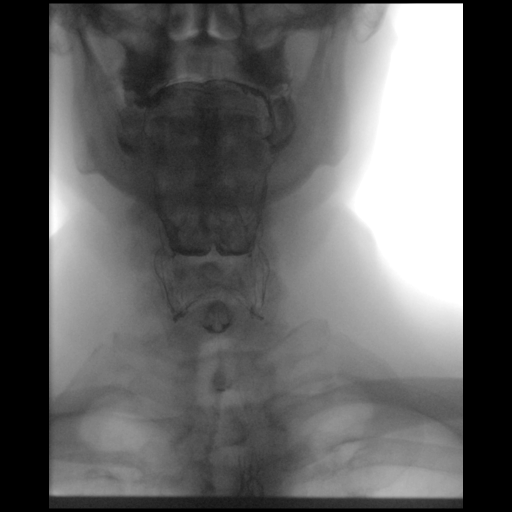

[Series 4: cp_standard · 0.34mm/px · 2 of 319 frames shown (4 of 8)]
[frame 160/319]
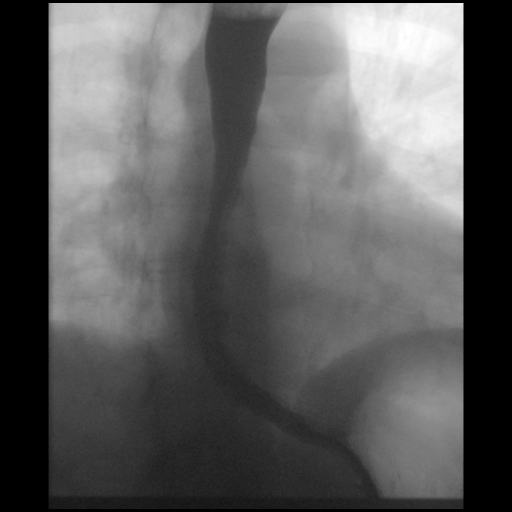
[frame 200/319]
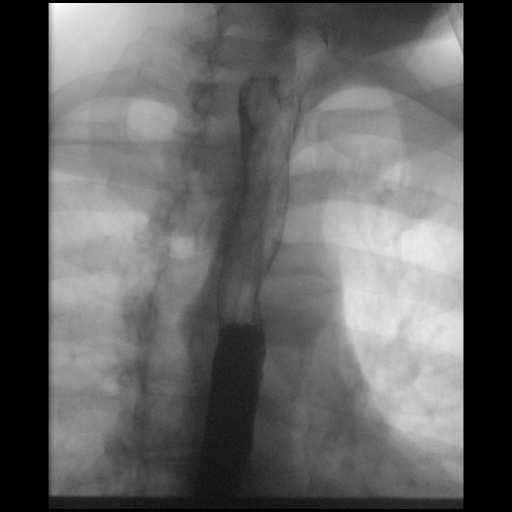

[Series 5: cp_standard · 0.34mm/px · 2 of 268 frames shown (5 of 8)]
[frame 27/268]
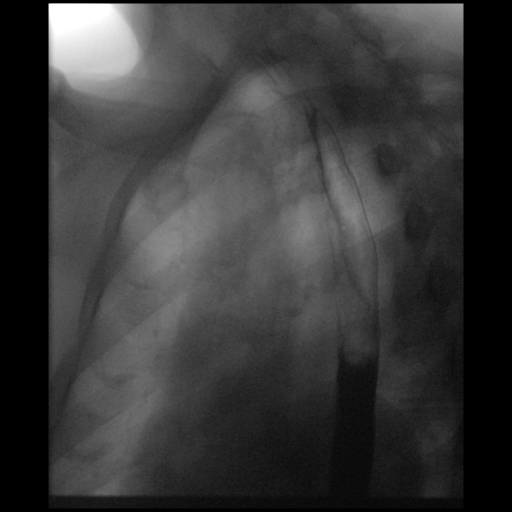
[frame 135/268]
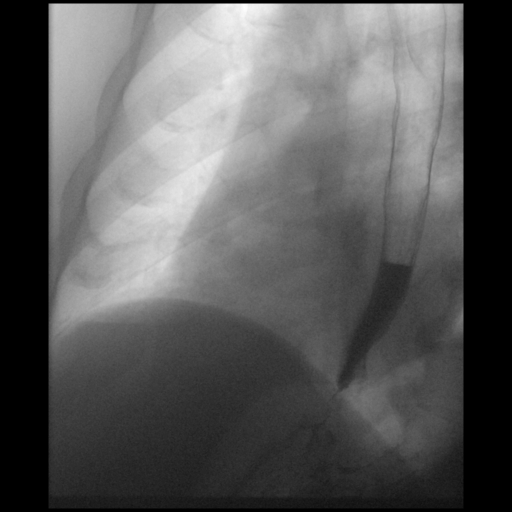

[Series 6: cp_standard · 0.35mm/px · 2 of 152 frames shown (6 of 8)]
[frame 1/152]
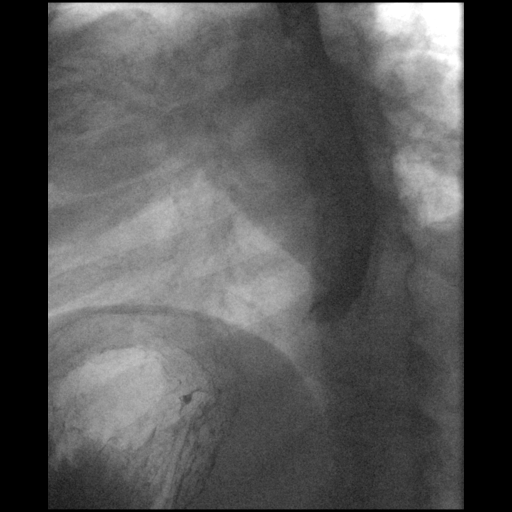
[frame 23/152]
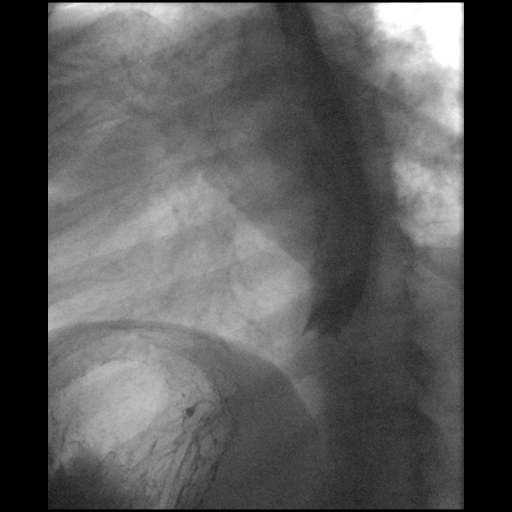

[Series 7: cp_standard · 0.35mm/px · 3 of 204 frames shown (7 of 8)]
[frame 31/204]
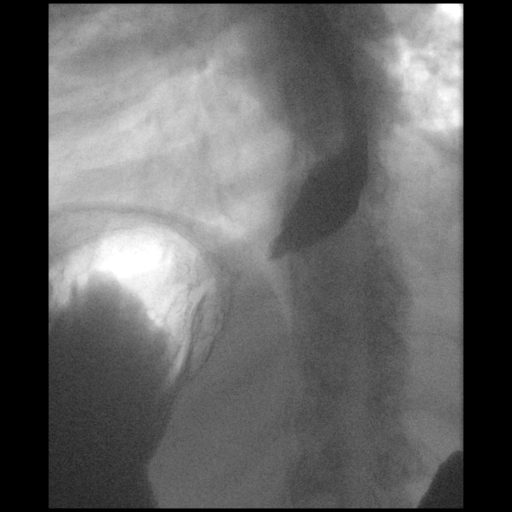
[frame 103/204]
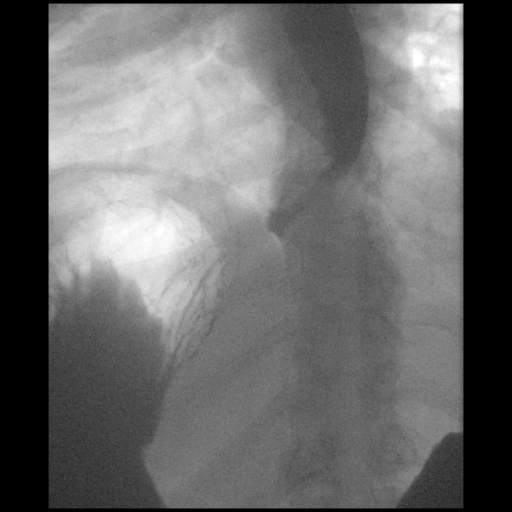
[frame 174/204]
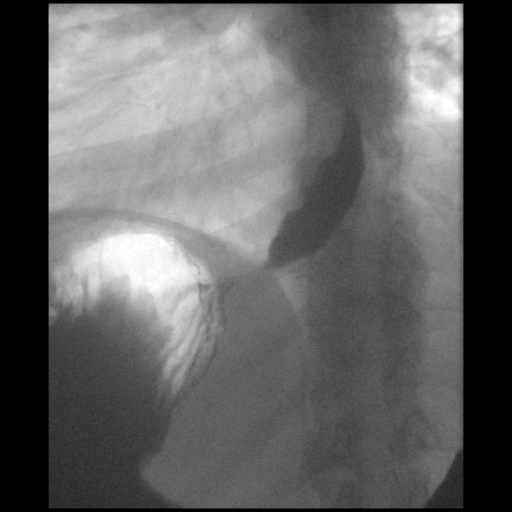

[Series 8: cp_standard · 0.36mm/px · 2 of 177 frames shown (8 of 8)]
[frame 89/177]
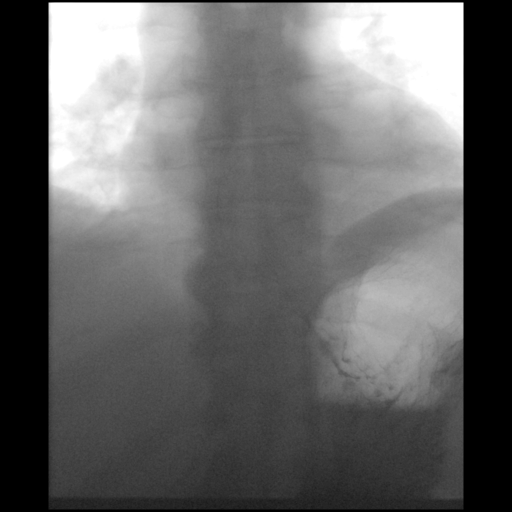
[frame 172/177]
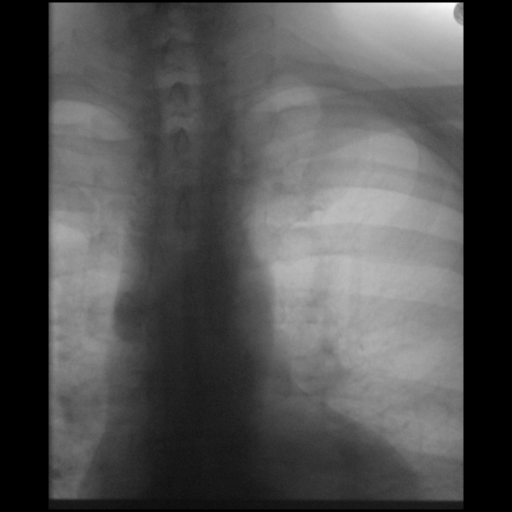

[16 of 24 positions shown; findings below may reference images not displayed]

FINDINGS: Patient was administered thin and thick barium liquid orally during
fluoroscopic evaluation. Contrast moved promptly through the
hypopharynx to the cervical esophagus without obstruction or
dysmotility. Hypopharynx and cervical esophagus appeared normal in
caliber without mass, ulceration or other focal wall irregularity.

Contrast moved promptly through the thoracic esophagus and into the
stomach without obstruction or dysmotility. Thoracic esophagus
appeared normal in caliber and configuration without mass,
ulceration or other wall irregularity. There was questionable mild
narrowing at the gastroesophageal junction but no obvious stricture
or ulceration.

A barium tablet passed promptly through the esophagus and into the
stomach without obstruction or dysmotility.
IMPRESSION: 1. Questionable mild narrowing at the gastroesophageal junction but
no obvious stricture, ulceration or other focal wall irregularity.
This may be related to the patient's prior history of variceal
banding. However, a 13 mm barium tablet passed promptly through the
esophagus and into the stomach without obstruction.
2. Otherwise normal esophagram.

## 2019-02-13 ENCOUNTER — Encounter (HOSPITAL_COMMUNITY): Payer: Self-pay | Admitting: *Deleted

## 2019-02-13 ENCOUNTER — Other Ambulatory Visit: Payer: Self-pay

## 2019-02-14 ENCOUNTER — Encounter (HOSPITAL_COMMUNITY)
Admission: RE | Disposition: A | Discharge status: Home / Self Care | Payer: Self-pay | Source: Home / Self Care | Attending: Gastroenterology

## 2019-02-14 ENCOUNTER — Encounter (HOSPITAL_COMMUNITY): Payer: Self-pay | Admitting: Anesthesiology

## 2019-02-14 ENCOUNTER — Ambulatory Visit (HOSPITAL_COMMUNITY): Payer: BLUE CROSS/BLUE SHIELD | Admitting: Anesthesiology

## 2019-02-14 ENCOUNTER — Ambulatory Visit (HOSPITAL_COMMUNITY)
Admission: RE | Admit: 2019-02-14 | Discharge: 2019-02-14 | Disposition: A | Discharge status: Home / Self Care | Payer: BLUE CROSS/BLUE SHIELD | Attending: Gastroenterology | Admitting: Gastroenterology

## 2019-02-14 DIAGNOSIS — Z09 Encounter for follow-up examination after completed treatment for conditions other than malignant neoplasm: Secondary | ICD-10-CM | POA: Insufficient documentation

## 2019-02-14 DIAGNOSIS — K7460 Unspecified cirrhosis of liver: Secondary | ICD-10-CM | POA: Diagnosis not present

## 2019-02-14 DIAGNOSIS — E785 Hyperlipidemia, unspecified: Secondary | ICD-10-CM | POA: Diagnosis not present

## 2019-02-14 DIAGNOSIS — Z79899 Other long term (current) drug therapy: Secondary | ICD-10-CM | POA: Diagnosis not present

## 2019-02-14 DIAGNOSIS — K2990 Gastroduodenitis, unspecified, without bleeding: Secondary | ICD-10-CM

## 2019-02-14 DIAGNOSIS — M1990 Unspecified osteoarthritis, unspecified site: Secondary | ICD-10-CM | POA: Insufficient documentation

## 2019-02-14 DIAGNOSIS — E119 Type 2 diabetes mellitus without complications: Secondary | ICD-10-CM | POA: Insufficient documentation

## 2019-02-14 DIAGNOSIS — K2970 Gastritis, unspecified, without bleeding: Secondary | ICD-10-CM | POA: Diagnosis not present

## 2019-02-14 DIAGNOSIS — K2980 Duodenitis without bleeding: Secondary | ICD-10-CM | POA: Diagnosis not present

## 2019-02-14 DIAGNOSIS — I10 Essential (primary) hypertension: Secondary | ICD-10-CM | POA: Diagnosis not present

## 2019-02-14 DIAGNOSIS — K219 Gastro-esophageal reflux disease without esophagitis: Secondary | ICD-10-CM | POA: Diagnosis not present

## 2019-02-14 DIAGNOSIS — K2950 Unspecified chronic gastritis without bleeding: Secondary | ICD-10-CM | POA: Diagnosis not present

## 2019-02-14 DIAGNOSIS — K766 Portal hypertension: Secondary | ICD-10-CM | POA: Diagnosis not present

## 2019-02-14 DIAGNOSIS — Z7984 Long term (current) use of oral hypoglycemic drugs: Secondary | ICD-10-CM | POA: Diagnosis not present

## 2019-02-14 DIAGNOSIS — K3189 Other diseases of stomach and duodenum: Secondary | ICD-10-CM

## 2019-02-14 DIAGNOSIS — I8510 Secondary esophageal varices without bleeding: Secondary | ICD-10-CM | POA: Insufficient documentation

## 2019-02-14 DIAGNOSIS — R1310 Dysphagia, unspecified: Secondary | ICD-10-CM | POA: Diagnosis not present

## 2019-02-14 DIAGNOSIS — I8500 Esophageal varices without bleeding: Secondary | ICD-10-CM

## 2019-02-14 HISTORY — PX: ESOPHAGEAL BANDING: SHX5518

## 2019-02-14 HISTORY — PX: BIOPSY: SHX5522

## 2019-02-14 HISTORY — PX: ESOPHAGOGASTRODUODENOSCOPY (EGD) WITH PROPOFOL: SHX5813

## 2019-02-14 LAB — GLUCOSE, CAPILLARY: Glucose-Capillary: 124 mg/dL — ABNORMAL HIGH (ref 70–99)

## 2019-02-14 SURGERY — ESOPHAGOGASTRODUODENOSCOPY (EGD) WITH PROPOFOL
Anesthesia: Monitor Anesthesia Care

## 2019-02-14 MED ORDER — LIDOCAINE VISCOUS HCL 2 % MT SOLN: 15.0000 mL | Freq: Four times a day (QID) | OROMUCOSAL | 0 refills | Status: AC | PRN

## 2019-02-14 MED ORDER — MIDAZOLAM HCL 2 MG/2ML IJ SOLN
INTRAMUSCULAR | Status: AC
  Filled 2019-02-14: qty 2

## 2019-02-14 MED ORDER — PROPOFOL 10 MG/ML IV BOLUS
INTRAVENOUS | Status: AC
  Filled 2019-02-14: qty 40

## 2019-02-14 MED ORDER — PROPOFOL 500 MG/50ML IV EMUL
INTRAVENOUS | Status: DC | PRN
  Administered 2019-02-14: 125 ug/kg/min via INTRAVENOUS

## 2019-02-14 MED ORDER — ONDANSETRON HCL 4 MG/2ML IJ SOLN
INTRAMUSCULAR | Status: DC | PRN
  Administered 2019-02-14: 4 mg via INTRAVENOUS

## 2019-02-14 MED ORDER — LACTATED RINGERS IV SOLN
INTRAVENOUS | Status: DC
  Administered 2019-02-14: 1000 mL via INTRAVENOUS

## 2019-02-14 MED ORDER — PROPOFOL 500 MG/50ML IV EMUL
INTRAVENOUS | Status: DC | PRN
  Administered 2019-02-14: 50 mg via INTRAVENOUS

## 2019-02-14 SURGICAL SUPPLY — 15 items

## 2019-02-14 NOTE — Discharge Instructions (Signed)
Esophageal Varices  Esophageal varices are enlarged veins in the part of the body that moves food from the mouth to the stomach (esophagus). They develop when extra blood is forced to flow through these veins because the blood's normal pathway is blocked. Without treatment, esophageal varices eventually break and bleed (hemorrhage), which can be life-threatening. What are the causes? This condition may be caused by:  Scarring of the liver (cirrhosis) due to alcoholism. This is the most common cause.  Long-term (chronic) liver disease.  Severe heart failure.  A blood clot in a vein that supplies the liver (portal vein).  A disease that causes inflammation in the organs and other body areas (sarcoidosis).  A parasitic infection that can cause liver damage (schistosomiasis). What are the signs or symptoms? Esophageal varices usually do not cause symptoms unless they start to bleed. Symptoms of bleeding esophageal varices include:  Vomiting material that is bright red or that is black and looks like coffee grounds.  Coughing up blood.  Stools (feces) that look black and tarry.  Dizziness or light-headedness.  Low blood pressure.  Loss of consciousness. How is this diagnosed? This condition is diagnosed with a procedure called endoscopy. During endoscopy, your health care provider uses a flexible tube with a small camera on the end of it (endoscope) to look down your throat and examine your esophagus. You may also have other tests, including:  Imaging tests such as a CT scan or ultrasound.  Blood tests. How is this treated? This condition may be treated with medicines or procedures that reduce pressure in the varices and reduce the risk of bleeding. Medicines are usually used for varices that are not bleeding. Procedures that may be done for bleeding varices include:  Placing an elastic band around the varices to keep them from bleeding (variceal ligation).  Replacing blood that  you have lost due to bleeding. This may include getting a transfusion of blood or parts of blood, such as platelets or clotting factors.  You may be given antibiotic medicine to help prevent infection.  Getting an injection that causes the varices to shrink and close (sclerotherapy). You may also be given medicines that tighten (constrict) blood vessels or change blood flow.  Placing a tube into your esophagus and then passing a balloon through the tube and inflating the balloon (balloon tamponade). The balloon applies pressure to the bleeding veins to help stop the bleeding.  Placing a small tube within the veins in the liver (transjugular intrahepatic portosystemic shunt, TIPS). This decreases blood flow and pressure in the esophageal varices. If other treatments do not work, you may need a liver transplant. Follow these instructions at home:  Take over-the-counter and prescription medicines only as told by your health care provider.  If you were prescribed an antibiotic medicine, take it as told by your health care provider. Do not stop taking the antibiotic even if you start to feel better.  Do not take any NSAIDs (such as aspirin or ibuprofen) before first getting approval from your health care provider.  Do not drink alcohol.  Return to your normal activities as told by your health care provider. Ask your health care provider what activities are safe for you.  Keep all follow-up visits as told by your health care provider. This is important. Contact a health care provider if:  You have abdominal pain.  You are unable to eat or drink. Get help right away if:  You have blood in your stool or vomit.  You  have stools that look black or tarry.  You have chest pain.  You feel dizzy or have low blood pressure.  You lose consciousness. These symptoms may represent a serious problem that is an emergency. Do not wait to see if the symptoms will go away. Get medical help right away.  Call your local emergency services (911 in the [place] Do not drive yourself to the hospital. Summary  Esophageal varices are enlarged veins in the esophagus, the part of your body that moves food from your mouth to your stomach.  Without treatment, esophageal varices eventually break and bleed (hemorrhage), which can be life-threatening.  Esophageal varices usually do not cause symptoms unless they start to bleed.  Keep all follow-up visits as told by your health care provider. This is important. This information is not intended to replace advice given to you by your health care provider. Make sure you discuss any questions you have with your health care provider. Document Released: 02/20/2004 Document Revised: 09/01/2017 Document Reviewed: 09/01/2017 Elsevier Interactive Patient Education  2019 Martin Strong HAD AN ENDOSCOPIC PROCEDURE TODAY: Refer to the procedure report and other information in the discharge instructions given to you for any specific questions about what was found during the examination. If this information does not answer your questions, please call [place] office at [phone] to clarify.   YOU SHOULD EXPECT: Some feelings of bloating in the abdomen. Passage of more gas than usual. Walking can help get rid of the air that was put into your GI tract during the procedure and reduce the bloating. If you had a lower endoscopy (such as a colonoscopy or flexible sigmoidoscopy) you may notice spotting of blood in your stool or on the toilet paper. Some abdominal soreness may be present for a day or two, also.  DIET: Your first meal following the procedure should be a light meal and then it is ok to progress to your normal diet. A half-sandwich or bowl of soup is an example of a good first meal. Heavy or fried foods are harder to digest and may make you feel nauseous or bloated. Drink plenty of fluids but you should avoid alcoholic beverages for 24 hours. If you had a esophageal  dilation, please see attached instructions for diet.    ACTIVITY: Your care partner should take you home directly after the procedure. You should plan to take it easy, moving slowly for the rest of the day. You can resume normal activity the day after the procedure however YOU SHOULD NOT DRIVE, use power tools, machinery or perform tasks that involve climbing or major physical exertion for 24 hours (because of the sedation medicines used during the test).   SYMPTOMS TO REPORT IMMEDIATELY: A gastroenterologist can be reached at any hour. Please call [phone]  for any of the following symptoms:  Following lower endoscopy (colonoscopy, flexible sigmoidoscopy) Excessive amounts of blood in the stool  Significant tenderness, worsening of abdominal pains  Swelling of the abdomen that is new, acute  Fever of 100 or higher  Following upper endoscopy (EGD, EUS, ERCP, esophageal dilation) Vomiting of blood or coffee ground material  New, significant abdominal pain  New, significant chest pain or pain under the shoulder blades  Painful or persistently difficult swallowing  New shortness of breath  Black, tarry-looking or red, bloody stools  FOLLOW UP:  If any biopsies were taken you will be contacted by phone or by letter within the next 1-3 weeks. Call [phone]  if you have not heard  about the biopsies in 3 weeks.  Please also call with any specific questions about appointments or follow up tests.

## 2019-02-14 NOTE — Anesthesia Postprocedure Evaluation (Signed)
Anesthesia Post Note  Patient: Euell Schiff  Procedure(s) Performed: ESOPHAGOGASTRODUODENOSCOPY (EGD) WITH PROPOFOL (N/A ) ESOPHAGEAL BANDING     Patient location during evaluation: PACU Anesthesia Type: MAC Level of consciousness: awake and alert Pain management: pain level controlled Vital Signs Assessment: post-procedure vital signs reviewed and stable Respiratory status: spontaneous breathing, nonlabored ventilation, respiratory function stable and patient connected to nasal cannula oxygen Cardiovascular status: stable and blood pressure returned to baseline Postop Assessment: no apparent nausea or vomiting Anesthetic complications: no    Last Vitals:  Vitals:   02/14/19 0920 02/14/19 0930  BP: (!) 144/81 137/67  Pulse: 80 72  Resp: 18 14  Temp:    SpO2: 96% 98%    Last Pain:  Vitals:   02/14/19 0905  TempSrc:   PainSc: 3                  Montez Hageman

## 2019-02-14 NOTE — Anesthesia Preprocedure Evaluation (Addendum)
Anesthesia Evaluation  Patient identified by MRN, date of birth, ID band Patient awake    Reviewed: Allergy & Precautions, NPO status , Patient's Chart, lab work & pertinent test results, reviewed documented beta blocker date and time   Airway Mallampati: II  TM Distance: >3 FB Neck ROM: Full    Dental  (+) Dental Advisory Given, Missing,    Pulmonary neg pulmonary ROS,    Pulmonary exam normal breath sounds clear to auscultation       Cardiovascular hypertension, Pt. on home beta blockers and Pt. on medications Normal cardiovascular exam Rhythm:Regular Rate:Normal     Neuro/Psych negative neurological ROS     GI/Hepatic GERD  ,(+) Cirrhosis   Esophageal Varices    , esophageal varices with bleeding   Endo/Other  diabetes, Type 2, Oral Hypoglycemic AgentsObesity   Renal/GU negative Renal ROS     Musculoskeletal  (+) Arthritis ,   Abdominal   Peds  Hematology  (+) Blood dyscrasia, anemia , thrombocytopenia   Anesthesia Other Findings   Reproductive/Obstetrics                            Anesthesia Physical  Anesthesia Plan  ASA: III  Anesthesia Plan: MAC   Post-op Pain Management:    Induction: Intravenous  PONV Risk Score and Plan: 1 and Propofol infusion and Treatment may vary due to age or medical condition  Airway Management Planned: Nasal Cannula and Natural Airway  Additional Equipment:   Intra-op Plan:   Post-operative Plan:   Informed Consent: I have reviewed the patients History and Physical, chart, labs and discussed the procedure including the risks, benefits and alternatives for the proposed anesthesia with the patient or authorized representative who has indicated his/her understanding and acceptance.     Dental advisory given  Plan Discussed with: CRNA and Anesthesiologist  Anesthesia Plan Comments:         Anesthesia Quick Evaluation

## 2019-02-14 NOTE — Interval H&P Note (Signed)
History and Physical Interval Note:  02/14/2019 7:49 AM  Martin Strong  has presented today for surgery, with the diagnosis of esophageal varices retreatment  The various methods of treatment have been discussed with the patient and family. After consideration of risks, benefits and other options for treatment, the patient has consented to  Procedure(s): ESOPHAGOGASTRODUODENOSCOPY (EGD) WITH PROPOFOL (N/A) with variceal banding as a surgical intervention .  The patient's history has been reviewed, patient examined, no change in status, stable for surgery.  I have reviewed the patient's chart and labs.  Questions were answered to the patient's satisfaction.     Dominic Pea Shae Augello

## 2019-02-14 NOTE — Transfer of Care (Signed)
Immediate Anesthesia Transfer of Care Note  Patient: Martin Strong  Procedure(s) Performed: Procedure(s): ESOPHAGOGASTRODUODENOSCOPY (EGD) WITH PROPOFOL (N/A) ESOPHAGEAL BANDING  Patient Location: PACU  Anesthesia Type:MAC  Level of Consciousness:  sedated, patient cooperative and responds to stimulation  Airway & Oxygen Therapy:Patient Spontanous Breathing and Patient connected to face mask oxgen  Post-op Assessment:  Report given to PACU RN and Post -op Vital signs reviewed and stable  Post vital signs:  Reviewed and stable  Last Vitals:  Vitals:   02/14/19 0720  BP: 129/70  Pulse: 74  Resp: 15  Temp: 36.6 C  SpO2: 12%    Complications: No apparent anesthesia complications

## 2019-02-15 NOTE — Op Note (Signed)
[facility] Patient Name: Martin Strong Procedure Date: 02/14/2019 MRN: [id number] Attending MD: Gerrit Heck , MD Date of Birth: February 12, 1966 CSN: [id number] Age: 53 Admit Type: Outpatient Procedure:                Upper GI endoscopy Indications:              Follow-up of esophageal varices                           53 year old male with a history of cirrhosis                            complicated by esophageal varices with prior                            variceal bleed. Presents today for ongoing variceal                            surveillance and band ligation. Last EGD was in                            January 2020 and notable for varices requiring 6                            bands placed. He is also on propranolol for dual                            therapy. No recent bleeding. Providers:                Gerrit Heck, MD, Cleda Daub, RN, Tinnie Gens, Technician, Arnoldo Hooker, CRNA Referring MD:              Medicines:                Monitored Anesthesia Care Complications:            No immediate complications. Estimated Blood Loss:     Estimated blood loss was minimal. Procedure:                Pre-Anesthesia Assessment:                           - Prior to the procedure, a History and Physical                            was performed, and patient medications and                            allergies were reviewed. The patient's tolerance of                            previous anesthesia was also reviewed. The risks  and benefits of the procedure and the sedation                            options and risks were discussed with the patient.                            All questions were answered, and informed consent                            was obtained. Prior Anticoagulants: The patient has                            taken no previous anticoagulant or antiplatelet                            agents. ASA  Grade Assessment: III - A patient with                            severe systemic disease. After reviewing the risks                            and benefits, the patient was deemed in                            satisfactory condition to undergo the procedure.                           After obtaining informed consent, the endoscope was                            passed under direct vision. Throughout the                            procedure, the patient's blood pressure, pulse, and                            oxygen saturations were monitored continuously. The                            GIF-H190 ([id number]) Olympus gastroscope was                            introduced through the mouth, and advanced to the                            second part of duodenum. The upper GI endoscopy was                            accomplished without difficulty. The patient                            tolerated the procedure well. Scope In: Scope Out: Findings:      Grade II varices were found in the lower third  of the esophagus. They       were 5 mm in largest diameter. Four bands were successfully placed with       complete eradication, resulting in deflation of varices, with the most       proximal band placed at 39 cm from the incisors. There was no bleeding       during the procedure.      The upper third of the esophagus and middle third of the esophagus were       normal. The esophagus was otherwise patent and no areas of luminal       narrowing, strictures, or rings.      The Z-line was regular and was found 44 cm from the incisors.      Diffuse moderate inflammation characterized by congestion (edema) and       erythema was found in the entire examined stomach. Biopsies were taken       with a cold forceps for Helicobacter pylori testing. Estimated blood       loss was minimal.      Localized moderate inflammation characterized by congestion (edema),       nodularity, and erythema was found in the  duodenal bulb. Biopsies were       taken with a cold forceps for histology. Estimated blood loss was       minimal.      The second portion of the duodenum was normal. Impression:               - Grade II esophageal varices. Completely                            eradicated. Banded with 4 bands.                           - Normal upper third of esophagus and middle third                            of esophagus.                           - Z-line regular, 44 cm from the incisors.                           - Gastritis. Biopsied.                           - Duodenitis. Biopsied.                           - Normal second portion of the duodenum. Moderate Sedation:      Not Applicable - Patient had care per Anesthesia. Recommendation:           - Patient has a contact number available for                            emergencies. The signs and symptoms of potential                            delayed complications were discussed with the  patient. Return to normal activities tomorrow.                            Written discharge instructions were provided to the                            patient.                           - Full liquid diet today.                           - Continue present medications.                           - Await pathology results.                           - Repeat upper endoscopy in 8 weeks for retreatment.                           - Return to GI clinic in 3 months. Procedure Code(s):        --- Professional ---                           [phone], Esophagogastroduodenoscopy, flexible,                            transoral; with band ligation of esophageal/gastric                            varices                           43239, Esophagogastroduodenoscopy, flexible,                            transoral; with biopsy, single or multiple Diagnosis Code(s):        --- Professional ---                           I85.00, Esophageal varices without  bleeding                           K29.70, Gastritis, unspecified, without bleeding                           K29.80, Duodenitis without bleeding CPT copyright 2018 American Medical Association. All rights reserved. The codes documented in this report are preliminary and upon coder review may  be revised to meet current compliance requirements. Gerrit Heck, MD 02/14/2019 9:13:47 AM Number of Addenda: 0

## 2019-02-16 ENCOUNTER — Encounter (HOSPITAL_COMMUNITY): Payer: Self-pay | Admitting: Gastroenterology

## 2019-03-02 ENCOUNTER — Encounter: Payer: Self-pay | Admitting: Gastroenterology

## 2019-03-29 ENCOUNTER — Other Ambulatory Visit: Payer: Self-pay | Admitting: Gastroenterology

## 2019-03-29 DIAGNOSIS — I8500 Esophageal varices without bleeding: Secondary | ICD-10-CM

## 2019-03-31 ENCOUNTER — Telehealth: Payer: Self-pay | Admitting: Gastroenterology

## 2019-03-31 DIAGNOSIS — I8500 Esophageal varices without bleeding: Secondary | ICD-10-CM

## 2019-03-31 MED ORDER — PANTOPRAZOLE SODIUM 40 MG PO TBEC: 40.0000 mg | DELAYED_RELEASE_TABLET | Freq: Two times a day (BID) | ORAL | 3 refills | Status: DC

## 2019-03-31 NOTE — Telephone Encounter (Signed)
Protonix resent to patients requested pharmacy

## 2019-03-31 NOTE — Telephone Encounter (Signed)
Patient called said that the pharmacy told the patient that the med pantoprazole (PROTONIX) 40 MG tablet does not go thru. Would like for [place] to resent it. I have confirmed the pharmacy on fie.

## 2019-04-25 ENCOUNTER — Encounter: Payer: Self-pay | Admitting: Gastroenterology

## 2019-05-23 ENCOUNTER — Telehealth: Payer: Self-pay

## 2019-05-23 ENCOUNTER — Other Ambulatory Visit: Payer: Self-pay

## 2019-05-23 ENCOUNTER — Encounter: Payer: Self-pay | Admitting: Gastroenterology

## 2019-05-23 ENCOUNTER — Telehealth (INDEPENDENT_AMBULATORY_CARE_PROVIDER_SITE_OTHER): Payer: BC Managed Care – PPO | Admitting: Gastroenterology

## 2019-05-23 VITALS — Ht 73.0 in | Wt 233.0 lb

## 2019-05-23 DIAGNOSIS — K7469 Other cirrhosis of liver: Secondary | ICD-10-CM | POA: Diagnosis not present

## 2019-05-23 DIAGNOSIS — K2990 Gastroduodenitis, unspecified, without bleeding: Secondary | ICD-10-CM

## 2019-05-23 DIAGNOSIS — I8500 Esophageal varices without bleeding: Secondary | ICD-10-CM | POA: Diagnosis not present

## 2019-05-23 DIAGNOSIS — K2970 Gastritis, unspecified, without bleeding: Secondary | ICD-10-CM

## 2019-05-23 DIAGNOSIS — K3189 Other diseases of stomach and duodenum: Secondary | ICD-10-CM

## 2019-05-23 DIAGNOSIS — R1310 Dysphagia, unspecified: Secondary | ICD-10-CM | POA: Diagnosis not present

## 2019-05-23 DIAGNOSIS — K766 Portal hypertension: Secondary | ICD-10-CM

## 2019-05-23 DIAGNOSIS — K7581 Nonalcoholic steatohepatitis (NASH): Secondary | ICD-10-CM

## 2019-05-23 NOTE — Patient Instructions (Signed)
If you are age 52 or older, your body mass index should be between 23-30. Your Body mass index is 30.74 kg/m. If this is out of the aforementioned range listed, please consider follow up with your Primary Care Provider.  If you are age 54 or younger, your body mass index should be between 19-25. Your Body mass index is 30.74 kg/m. If this is out of the aformentioned range listed, please consider follow up with your Primary Care Provider.   To help prevent the possible spread of infection to our patients, communities, and staff; we will be implementing the following measures:  As of now we are not allowing any visitors/family members to accompany you to any upcoming appointments with [facility]. If you have any concerns about this please contact our office to discuss prior to the appointment.   You have been scheduled for an MRI at [facility] on 05/29/2019. Your appointment time is 8:00am. Please arrive 15 minutes prior to your appointment time for registration purposes. Please make certain not to have anything to eat or drink 6 hours prior to your test. In addition, if you have any metal in your body, have a pacemaker or defibrillator, please be sure to let your ordering physician know. This test typically takes 45 minutes to 1 hour to complete. Should you need to reschedule, please call [phone] to do so.  Your provider has requested that you go to the basement level for lab work at our [place] location ([address] [place] [address]) . Press "B" on the elevator. The lab is located at the first door on the left as you exit the elevator. You may go at whatever time is convienent for you. The current hours of operations are Monday- Friday 7:30am-4:30pm.  We are going to schedule your for an EGD with banding at [facility] as soon as we have some available days.  Please call our office at [phone] to set up your 3-6 month follow up visit.  It was a  pleasure to see you today!  Vito Cirigliano, D.O.

## 2019-05-23 NOTE — Progress Notes (Signed)
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 Was previously followed by GI in [place], [place]. Cirrhosis complicated by esophageal varices with bleeding requiring multiple EGDs with banding in the past along with propranolol for dual prophylaxis. Otherwise no history of ascites, hepatic encephalopathy. Last seen by me on 01/25/18 with repeat EGD with banding on 02/14/19, with 4 bands placed.     HPI:    Due to current restrictions/limitations of in-office visits due to the COVID-19 pandemic, this scheduled clinical appointment was converted to a telehealth virtual consultation using [employer].  -The patient did consent to this virtual visit and is aware of possible charges through their insurance for this visit.  -Names of all parties present: Martin Strong (patient), Gerrit Heck, DO, [facility] (physician) -Patient location: Home -Physician location: Office  Martin Strong is a 53 y.o. male presented to the Gastroenterology Clinic for ongoing f/u.  Last seen by me in clinic in 01/2019, with repeat EGD completed in 02/2019 for acute onset dysphagia.  Barium esophagram with possible mild narrowing at the GE J (possibly related to variceal banding), 13 mm barium tablet passed without issue.  EGD in 02/2019 with grade 2 varices with banding x4 with complete eradication, otherwise normal esophagus without luminal narrowing, stricture, rings, portal hypertensive gastropathy.  Recommended repeat EGD in 8 weeks for retreatment, which was postponed due to COVID related restrictions.  Today states he otherwise feels well and his baseline.  No complaints.   Tolerating p.o. intake and denies any overt GI blood loss, abdominal pain, n/v/d/c/f/c, weight loss, edema, ascites, jaundice, icteric sclera, SOB, DOE, CP, syncope, falls.  Tolerating all medications as prescribed.  The previously described dysphagia has improved, with only 2 episodes since 01/2019.  Symptoms clear with fluids/water.  No food impactions.  No odynophagia.  Cirrhosis Hx: - Etiology: NASH - Diagnosed 2017 - Liver bx: None - Complications: EV with admission for bleeding 09/2018 treated with EGD with EVL - Rx: Propranolol (dual prophy) - HCC Screening: AFP <0.9.  Reordered MRI liver for [place] screening  - MELD: 10 - Child Pugh: A (6 pts)  Endoscopic history: - EGD x3 in 2017 in [place], [place] for esophageal varices with banding - EGD (09/2018, Dr. Bryan Lemma): Grade 2 esophageal varices with stigmata of recent bleeding requiring banding x2 with complete eradication, portal hypertensive gastropathy without bleeding. - EGD (12/2018, Dr. Bryan Lemma): Grade II esophageal varices, 6 bands placed with deflation. Normal Z line at 46 cm, severe portal HTN gastropathy with contact oozing, small GOV1, normal duodenum. H pylori serologies negative -EGD (02/2019, Dr. Bryan Lemma): Grade 2 esophageal varices, 4 bands placed with complete eradication.  Portal hypertensive gastropathy/2 adenopathy  Past medical history, past surgical history, social history, family history, medications, and allergies reviewed in the chart and with patient.    Past Medical History:  Diagnosis Date  . Anemia   . Arthritis   . Cirrhosis ([place])   . Diabetes mellitus without complication ([place])   . Fatty liver   . GERD (gastroesophageal reflux disease)   . GI bleed   . Hyperlipidemia   . Hypertension  Past Surgical History:  Procedure Laterality Date  . BIOPSY  02/14/2019   Procedure: BIOPSY;  Surgeon: Lavena Bullion, DO;  Location: [facility];  Service: Gastroenterology;;  . ESOPHAGEAL BANDING   10/07/2018   Procedure: ESOPHAGEAL BANDING;  Surgeon: Lavena Bullion, DO;  Location: [facility];  Service: Gastroenterology;;  . ESOPHAGEAL BANDING N/A 01/03/2019   Procedure: ESOPHAGEAL BANDING;  Surgeon: Lavena Bullion, DO;  Location: [facility];  Service: Gastroenterology;  Laterality: N/A;  . ESOPHAGEAL BANDING  02/14/2019   Procedure: ESOPHAGEAL BANDING;  Surgeon: Lavena Bullion, DO;  Location: [facility];  Service: Gastroenterology;;  . esophageal bands    . ESOPHAGOGASTRODUODENOSCOPY (EGD) WITH PROPOFOL N/A 10/07/2018   Procedure: ESOPHAGOGASTRODUODENOSCOPY (EGD) WITH PROPOFOL;  Surgeon: Lavena Bullion, DO;  Location: [facility];  Service: Gastroenterology;  Laterality: N/A;  . ESOPHAGOGASTRODUODENOSCOPY (EGD) WITH PROPOFOL N/A 01/03/2019   Procedure: ESOPHAGOGASTRODUODENOSCOPY (EGD) WITH PROPOFOL;  Surgeon: Lavena Bullion, DO;  Location: [facility];  Service: Gastroenterology;  Laterality: N/A;  . ESOPHAGOGASTRODUODENOSCOPY (EGD) WITH PROPOFOL N/A 02/14/2019   Procedure: ESOPHAGOGASTRODUODENOSCOPY (EGD) WITH PROPOFOL;  Surgeon: Lavena Bullion, DO;  Location: [facility];  Service: Gastroenterology;  Laterality: N/A;   Family History  Problem Relation Age of Onset  . Diabetes Other   . Esophageal cancer Father   . Colon cancer Neg Hx   . Rectal cancer Neg Hx   . Stomach cancer Neg Hx    Social History   Tobacco Use  . Smoking status: Never Smoker  . Smokeless tobacco: Never Used  Substance Use Topics  . Alcohol use: Never    Frequency: Never  . Drug use: Never   Current Outpatient Medications  Medication Sig Dispense Refill  . acetaminophen (TYLENOL) 500 MG tablet Take 1,000 mg by mouth every 6 (six) hours as needed for moderate pain or headache.    . cholecalciferol (VITAMIN D) 1000 units tablet Take 1,000 Units by mouth 2 (two) times daily.    . dapagliflozin propanediol (FARXIGA) 10 MG TABS tablet Take 10 mg by mouth every evening.     .  Ferrous Sulfate (IRON) 325 (65 Fe) MG TABS Take 1 tablet (325 mg total) by mouth 2 (two) times daily. 60 each 0  . glipiZIDE (GLUCOTROL) 10 MG tablet Take 10 mg by mouth 2 (two) times daily.    Marland Kitchen lisinopril (PRINIVIL,ZESTRIL) 20 MG tablet Take 20 mg by mouth 2 (two) times daily.    . metFORMIN (GLUCOPHAGE) 1000 MG tablet Take 1,000 mg by mouth 2 (two) times daily with a meal.    . Omega-3 Fatty Acids (FISH OIL) 1000 MG CAPS Take 1,000 mg by mouth daily.     . pantoprazole (PROTONIX) 40 MG tablet Take 1 tablet (40 mg total) by mouth 2 (two) times daily. 60 tablet 3  . propranolol (INDERAL) 40 MG tablet Take 40 mg by mouth 2 (two) times daily.      No current facility-administered medications for this visit.    Allergies  Allergen Reactions  . Nadolol Itching and Nausea Only     Review of Systems: All systems reviewed and negative except where noted in HPI.     Physical Exam:    Complete physical exam not completed due to the nature of this telehealth communication.   Gen: Awake, alert, and oriented, and well communicative. HEENT: EOMI, non-icteric sclera, NCAT, MMM Neck: Normal movement of head and neck Pulm: No labored breathing, speaking in full sentences without conversational dyspnea Derm: No apparent  lesions or bruising in visible field MS: Moves all visible extremities without noticeable abnormality Psych: Pleasant, cooperative, normal speech, thought processing seemingly intact   ASSESSMENT AND PLAN;   1) NASH Cirrhosis: History of NASH cirrhosis with decompensation as manifested by bleeding esophageal varices.  Otherwise relatively low MELD and Child Pugh A.  - Repeat labs now with CBC, CMP, PT/INR -Check annual zinc, iron panel, B12/folate, vitamin D - Repeat EGD for ongoing EV surveillance/treatment now that COVID-19 related restrictions are being lifted -AFP <0.9.  Previously ordered MRI liver for [place] screening.  Not yet done.  Reordered today for [place] screening -  Resume <2gm Na diet - No plan for referral to transplant evaluation given lowMELD -Check HAV and HBV vaccination status -Resume propranolol for dual prophylaxis  2) Esophageal varices: -Repeat [facility] at [facility] for ongoing surveillance and treatment -Resume propranolol for dual prophylaxis given history of bleed -Otherwise without any evidence of overt GI blood loss recently  3) Mild Iron reduction: Mildly reduced iron indices in 2019. Had been taking oral iron therapy and has continued to take without any medication ADRs, with improvement in H/H at 12/37 (from 7.7/23).  - Resume PO iron - Repeat CBC and iron indices   4) Dysphagia: Mild, uncommon solid food dysphagia without history of food impactions.  Symptoms clear with water/fluids.  Has happened twice since 01/2019.  Mildly bothersome only.  -Barium esophagram was largely unrevealing and subsequent EGD without luminal narrowing, stricture, ring, etc.  Could be some mild delay related to his esophageal varices.  However, given the presence of varices, he would like to hold off on Esophageal Manometry due to the possible risks, or/think is a very reasonable decision -Advised patient to cut food into small pieces, eat small bites, chew food thoroughly and with plenty of liquids to avoid food impaction.  I spent a total of 25 minutes of face-to-face time with the patient. Greater than 50% of the time was spent counseling and coordinating care.   The indications, risks, and benefits of EGD were explained to the patient in detail. Risks include but are not limited to bleeding, perforation, adverse reaction to medications, and cardiopulmonary compromise. Sequelae include but are not limited to the possibility of surgery, hositalization, and mortality. The patient verbalized understanding and wished to proceed. All questions answered, referred to scheduler. Further recommendations pending results of the exam.    Lavena Bullion, DO,  FACG  05/23/2019, 10:03 AM   [name], NP

## 2019-05-23 NOTE — Telephone Encounter (Signed)
Patient states he would like to hold of on EGD at [facility] until after covid-19 testing and quarantine policy is not long in place.

## 2019-05-24 ENCOUNTER — Other Ambulatory Visit (INDEPENDENT_AMBULATORY_CARE_PROVIDER_SITE_OTHER): Payer: BC Managed Care – PPO

## 2019-05-24 DIAGNOSIS — I8500 Esophageal varices without bleeding: Secondary | ICD-10-CM

## 2019-05-24 DIAGNOSIS — R1310 Dysphagia, unspecified: Secondary | ICD-10-CM | POA: Diagnosis not present

## 2019-05-24 DIAGNOSIS — K7581 Nonalcoholic steatohepatitis (NASH): Secondary | ICD-10-CM | POA: Diagnosis not present

## 2019-05-24 LAB — CBC WITH DIFFERENTIAL/PLATELET
Basophils Absolute: 0 10*3/uL (ref 0.0–0.1)
Basophils Relative: 0.7 % (ref 0.0–3.0)
Eosinophils Absolute: 0.1 10*3/uL (ref 0.0–0.7)
Eosinophils Relative: 2.7 % (ref 0.0–5.0)
HCT: 38.4 % — ABNORMAL LOW (ref 39.0–52.0)
Hemoglobin: 13 g/dL (ref 13.0–17.0)
Lymphocytes Relative: 16.3 % (ref 12.0–46.0)
Lymphs Abs: 0.8 10*3/uL (ref 0.7–4.0)
MCHC: 34 g/dL (ref 30.0–36.0)
MCV: 98.6 fl (ref 78.0–100.0)
Monocytes Absolute: 0.6 10*3/uL (ref 0.1–1.0)
Monocytes Relative: 11 % (ref 3.0–12.0)
Neutro Abs: 3.6 10*3/uL (ref 1.4–7.7)
Neutrophils Relative %: 69.3 % (ref 43.0–77.0)
Platelets: 74 10*3/uL — ABNORMAL LOW (ref 150.0–400.0)
RBC: 3.89 Mil/uL — ABNORMAL LOW (ref 4.22–5.81)
RDW: 14.2 % (ref 11.5–15.5)
WBC: 5.2 10*3/uL (ref 4.0–10.5)

## 2019-05-24 LAB — PROTIME-INR
INR: 1.2 ratio — ABNORMAL HIGH (ref 0.8–1.0)
Prothrombin Time: 13.5 s — ABNORMAL HIGH (ref 9.6–13.1)

## 2019-05-24 LAB — COMPREHENSIVE METABOLIC PANEL
ALT: 33 U/L (ref 0–53)
AST: 33 U/L (ref 0–37)
Albumin: 4.2 g/dL (ref 3.5–5.2)
Alkaline Phosphatase: 69 U/L (ref 39–117)
BUN: 17 mg/dL (ref 6–23)
CO2: 27 mEq/L (ref 19–32)
Calcium: 9.3 mg/dL (ref 8.4–10.5)
Chloride: 103 mEq/L (ref 96–112)
Creatinine, Ser: 0.97 mg/dL (ref 0.40–1.50)
GFR: 81.45 mL/min (ref >60.00)
Glucose, Bld: 125 mg/dL — ABNORMAL HIGH (ref 70–99)
Potassium: 4.5 mEq/L (ref 3.5–5.1)
Sodium: 137 mEq/L (ref 135–145)
Total Bilirubin: 1 mg/dL (ref 0.2–1.2)
Total Protein: 7.3 g/dL (ref 6.0–8.3)

## 2019-05-24 LAB — B12 AND FOLATE PANEL
Folate: >23.9 ng/mL (ref >5.9)
Vitamin B-12: 579 pg/mL (ref 211–911)

## 2019-05-24 LAB — VITAMIN D 25 HYDROXY (VIT D DEFICIENCY, FRACTURES): VITD: 63.4 ng/mL (ref 30.00–100.00)

## 2019-05-24 NOTE — Telephone Encounter (Signed)
Patient has been added to Hospital list for post covid-19.

## 2019-05-24 NOTE — Telephone Encounter (Signed)
Understood.  I am not sure when that policy will change, but can we keep him on a recall list and contact him when the policy changes.  Thank you.

## 2019-05-26 LAB — IRON,TIBC AND FERRITIN PANEL
%SAT: 16 % (calc) — ABNORMAL LOW (ref 20–48)
Ferritin: 41 ng/mL (ref 38–380)
Iron: 52 ug/dL (ref 50–180)
TIBC: 317 mcg/dL (calc) (ref 250–425)

## 2019-05-26 LAB — ZINC: Zinc: 86 ug/dL (ref 60–130)

## 2019-05-26 LAB — HEPATITIS B SURFACE ANTIBODY,QUALITATIVE: Hep B S Ab: NONREACTIVE

## 2019-05-26 LAB — HEPATITIS B SURFACE ANTIGEN: Hepatitis B Surface Ag: NONREACTIVE

## 2019-05-26 LAB — HEPATITIS A ANTIBODY, TOTAL: Hepatitis A AB,Total: NONREACTIVE

## 2019-05-29 ENCOUNTER — Ambulatory Visit (HOSPITAL_COMMUNITY)
Admission: RE | Admit: 2019-05-29 | Discharge: 2019-05-29 | Disposition: A | Discharge status: Home / Self Care | Payer: BC Managed Care – PPO | Source: Ambulatory Visit | Attending: Gastroenterology | Admitting: Gastroenterology

## 2019-05-29 ENCOUNTER — Other Ambulatory Visit: Payer: Self-pay

## 2019-05-29 DIAGNOSIS — R1310 Dysphagia, unspecified: Secondary | ICD-10-CM

## 2019-05-29 DIAGNOSIS — I8500 Esophageal varices without bleeding: Secondary | ICD-10-CM | POA: Diagnosis present

## 2019-05-29 DIAGNOSIS — K7581 Nonalcoholic steatohepatitis (NASH): Secondary | ICD-10-CM | POA: Diagnosis present

## 2019-05-29 IMAGING — MR MRI ABDOMEN WITH AND WITHOUT CONTRAST
10 of 19 series · 20 of 48 positions shown · IV contrast (gadavist)
Comparison: Ultrasound [DATE] and CT AP [DATE].

CLINICAL DATA: Cirrhosis with esophageal varices.  HCC screening.

EXAM:
MRI ABDOMEN WITHOUT AND WITH CONTRAST
TECHNIQUE: Multiplanar multisequence MR imaging of the abdomen was performed
both before and after the administration of intravenous contrast.
CONTRAST:  10 cc Gadavist

[Series 3: T2 fat-sat · axial · 5.0mm · 0.94mm/px · 1 of 52 slices shown]
[im 1/52]
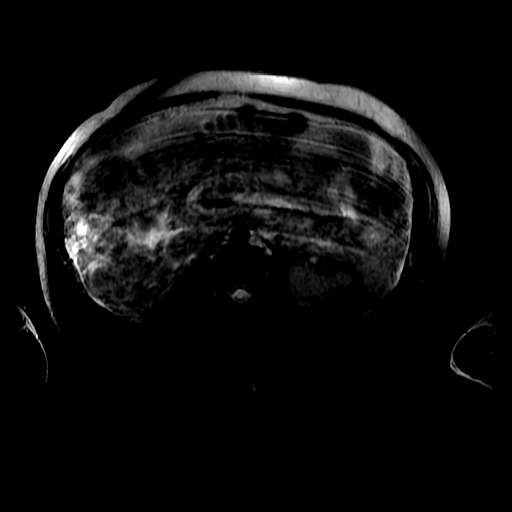

[Series 4: DWI b500 · axial · 6.0mm · 1.88mm/px · 1 of 74 slices shown]
[im 1/74]
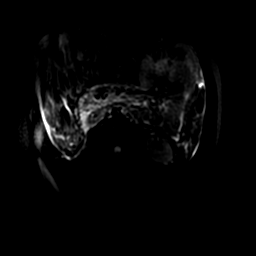

[Series 5: T2 · axial · 5.0mm · 0.94mm/px · z∈[-121,+134]mm · 2 of 52 slices shown (1 of 2)]
[im 1/52]
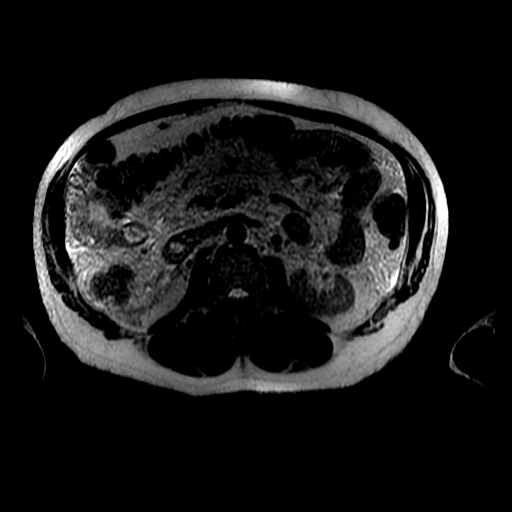
[im 52/52]
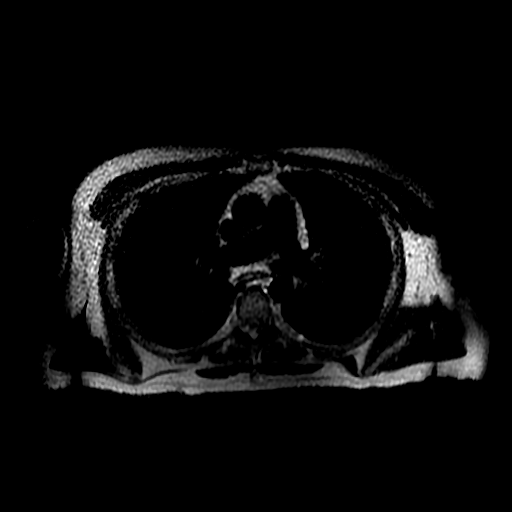

[Series 6: T2 · coronal · 5.0mm · 0.88mm/px · 2 of 59 slices shown (2 of 2)]
[im 1/59]
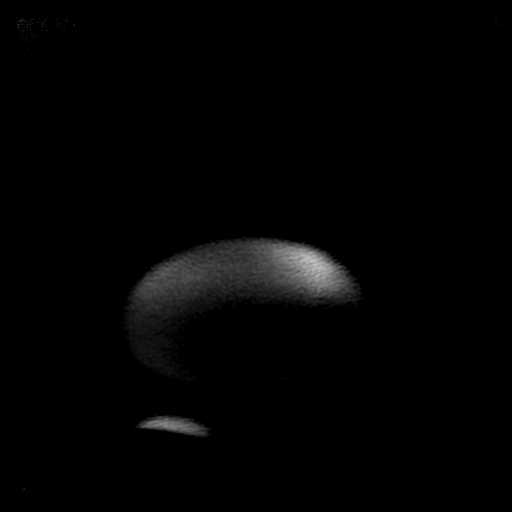
[im 59/59]
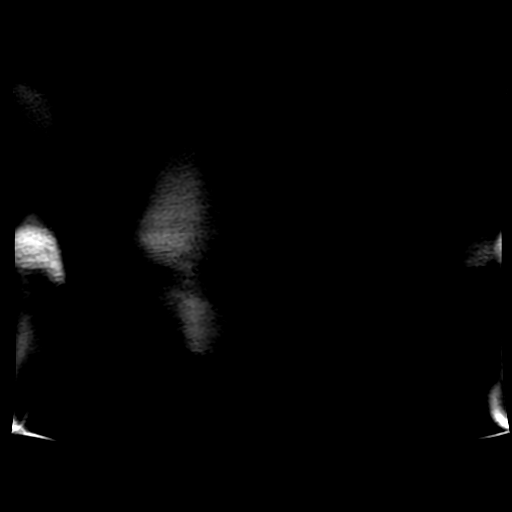

[Series 7: bSSFP · axial · 5.0mm · 0.94mm/px · z∈[-121,+134]mm · 2 of 52 slices shown]
[im 1/52]
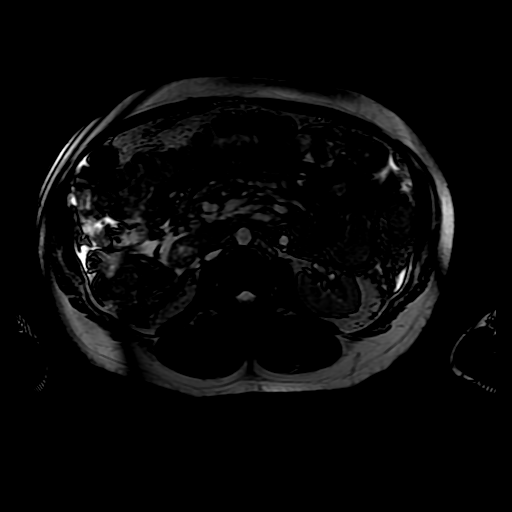
[im 52/52]
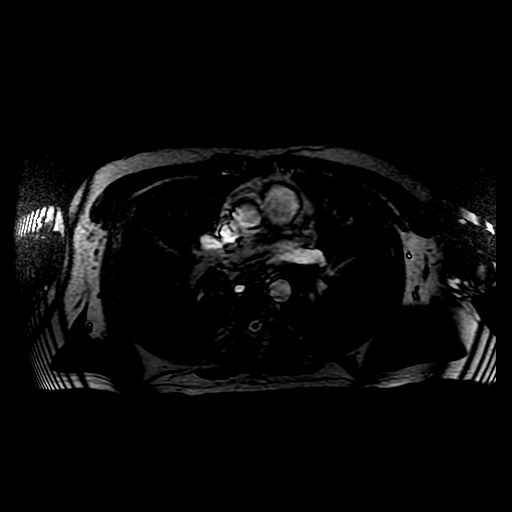

[Series 8: ax dualecho bh · axial · 5.0mm · 0.94mm/px · z∈[-121,+134]mm · 3 of 104 slices shown]
[im 1/104]
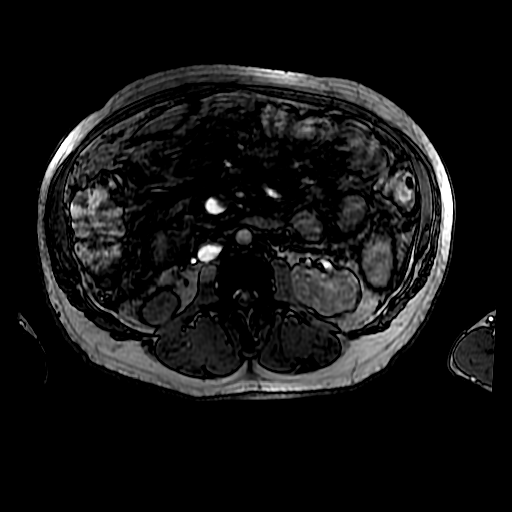
[im 52/104]
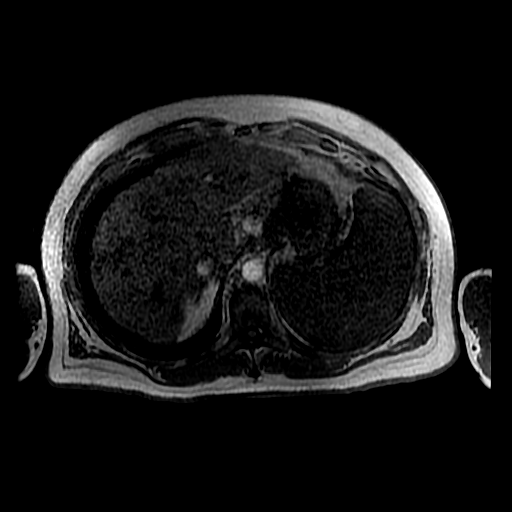
[im 104/104]
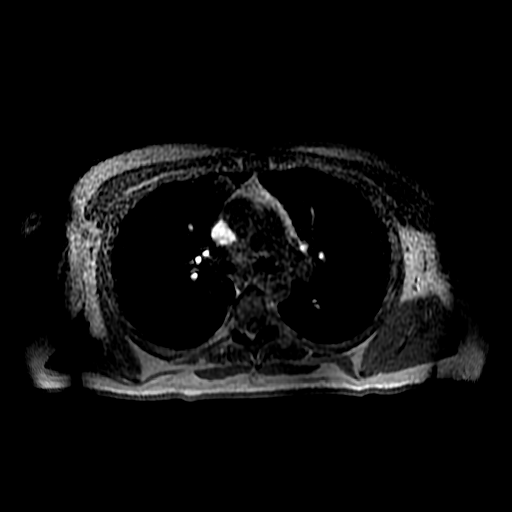

[Series 400: DWI · axial · 6.0mm · 1.88mm/px · 1 of 37 slices shown]
[im 1/37]
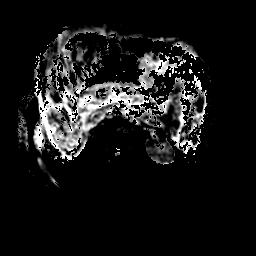

[Series 900: T1 dynamic · axial · 5.6mm · 0.90mm/px · z∈[-102,+141]mm · 3 of 88 slices shown (1 of 3)]
[im 1/88]
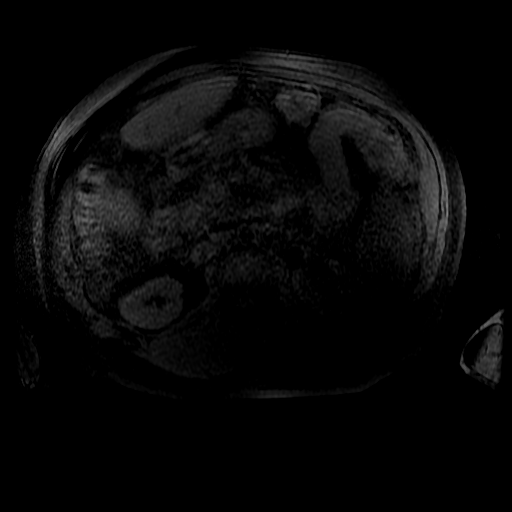
[im 44/88]
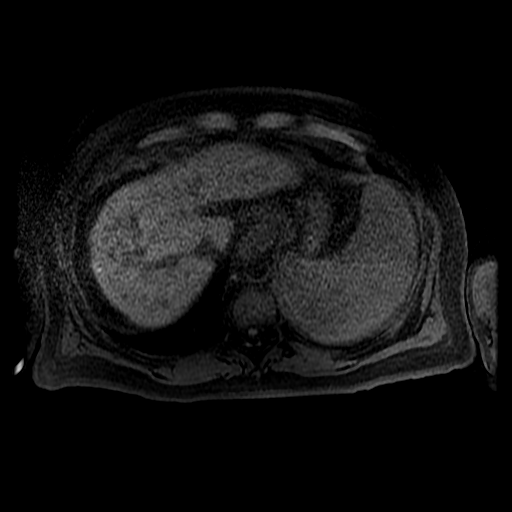
[im 88/88]
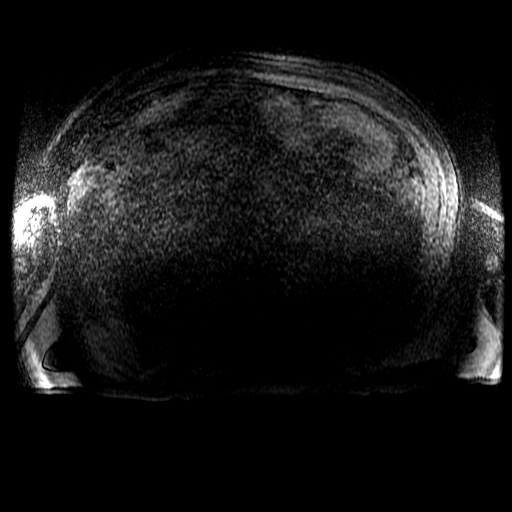

[Series 1000: T1 dynamic · axial · 5.6mm · 0.90mm/px · z∈[-140,+115]mm · 3 of 92 slices shown (2 of 3)]
[im 1/92]
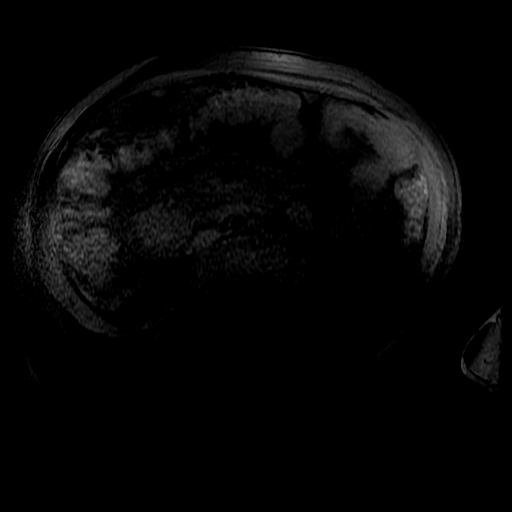
[im 46/92]
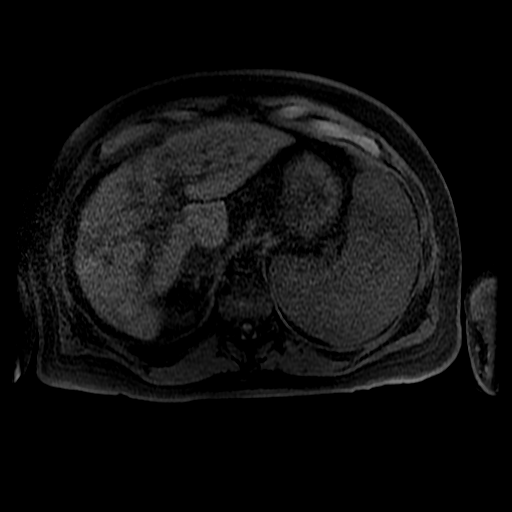
[im 92/92]
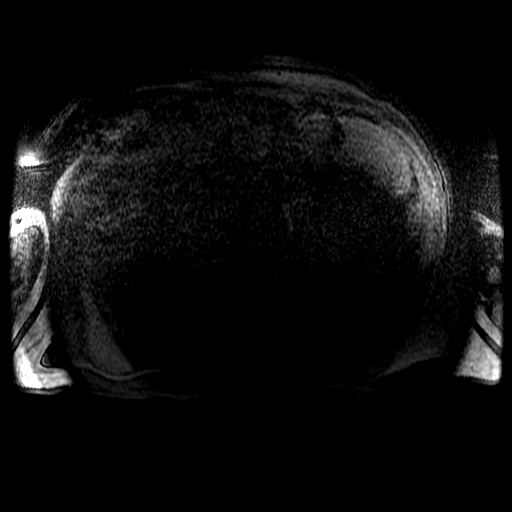

[Series 1001: T1 dynamic · axial · 5.6mm · 0.90mm/px · z∈[-140,-14]mm · 2 of 92 slices shown (3 of 3)]
[im 1/92]
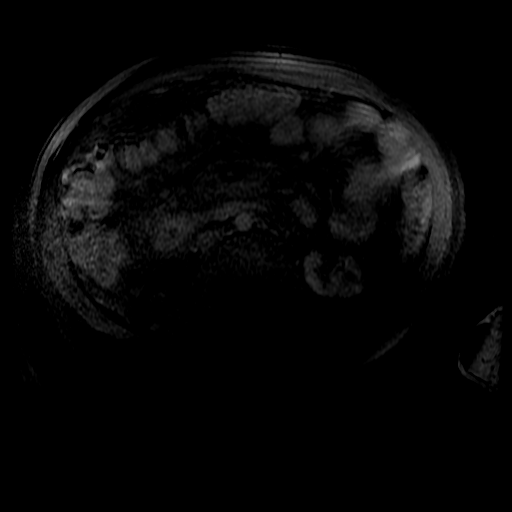
[im 46/92]
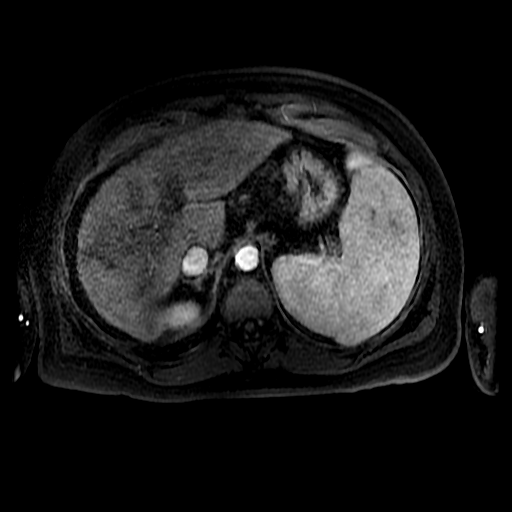

[20 of 48 positions shown; findings below may reference images not displayed]

FINDINGS: Lower chest: No acute findings.

Hepatobiliary: Advanced changes of cirrhosis. Within the segment 7
and 8 there is a wedge-shaped area of confluent hepatic fibrosis,
image 44/[JA]. Within this area, there is an equivocal area of
relative decreased enhancement measuring 1.3 cm, image 33/[JA], with
possible pseudo capsule. No corresponding abnormal signal on the T2
weighted sequences or the diffusion-weighted images. Moderate
distension of the gallbladder which contains multiple tiny stones.
No biliary ductal dilatation.

Pancreas: No mass, inflammatory changes, or other parenchymal
abnormality identified.

Spleen: The spleen measures 15.3 by 6.7 by 15.9 cm (volume = 850
cm^3).

Adrenals/Urinary Tract: Normal appearance of the adrenal glands. The
right kidney is unremarkable. Tiny left kidney cyst measuring 8 mm.

Stomach/Bowel: Visualized portions within the abdomen are
unremarkable.

Vascular/Lymphatic: Normal appearance of the aorta. The portal vein
and its branches are patent. Reconstitution of the umbilical vein
with left upper quadrant abdominal varices and small esophageal
varices. No adenopathy.

Other:  Small volume of upper abdominal ascites.

Musculoskeletal: No suspicious bone lesions identified.
IMPRESSION: 1. Advanced changes of cirrhosis. Confluent hepatic fibrosis noted
within segment 7 and 8.
2. No definite evidence for hepatoma. There is an equivocal area of
washout seen beginning on the portal venous phase images within
segment 7 near confluent hepatic fibrosis. Nonspecific. Advise
follow-up imaging with MRI in 6 months to assess temporal change in
the appearances of this abnormality.
3. Stigmata of portal venous hypertension including splenomegaly,
ascites and varices.

## 2019-05-29 MED ORDER — GADOBUTROL 1 MMOL/ML IV SOLN
10.0000 mL | Freq: Once | INTRAVENOUS | Status: AC | PRN
  Administered 2019-05-29: 10 mL via INTRAVENOUS

## 2019-05-30 ENCOUNTER — Other Ambulatory Visit: Payer: Self-pay

## 2019-05-30 ENCOUNTER — Telehealth: Payer: Self-pay | Admitting: Gastroenterology

## 2019-05-30 DIAGNOSIS — I8500 Esophageal varices without bleeding: Secondary | ICD-10-CM

## 2019-05-30 DIAGNOSIS — K7581 Nonalcoholic steatohepatitis (NASH): Secondary | ICD-10-CM

## 2019-05-30 MED ORDER — FERROUS SULFATE 325 (65 FE) MG PO TABS: 325.0000 mg | ORAL_TABLET | Freq: Every day | ORAL | 3 refills | Status: AC

## 2019-05-30 NOTE — Telephone Encounter (Signed)
Called patient back to go over lab results and MRI results. Answered all of patients questions and patient was happy with results.

## 2019-05-30 NOTE — Telephone Encounter (Signed)
Patient called said he has a question regarding his labs that he had done.

## 2019-07-19 ENCOUNTER — Telehealth: Payer: Self-pay

## 2019-07-19 NOTE — Telephone Encounter (Signed)
Called and spoke with patient-patient reports he feels like nothing will go down when he swallows-started happening a lot more since 07/18/2019-not a pain it is more of tightness in his chest when he tries to swallow foods/drink-whatever was swallowed just comes right back up; patient reports he has been taking his Protonix BID as prescribed until this started getting so bad the last day or so; has not used any OTC meds like Tums/Maalox/Mylanta-  Patient denies respiratory issues/distress at this time-worried about being chocked because he cannot swallow anything;  Please advise;

## 2019-07-19 NOTE — Telephone Encounter (Signed)
Patient called in regards to his test that was done for this throat. Said that he can't keep anything down when it comes to food and liquids and wants to know what to do with this. Call back number is [phone].

## 2019-07-20 ENCOUNTER — Telehealth: Payer: Self-pay | Admitting: Gastroenterology

## 2019-07-20 NOTE — Telephone Encounter (Signed)
Martin Strong, Martin Strong has been working him up. Please run it by him Had EGD 02/15/2019 with EVL, Was to get repeat EGD in 8 weeks to confirm eradication of varices He may be due for EGD already.  Thx  RG

## 2019-07-20 NOTE — Telephone Encounter (Signed)
Please review previous messages and advise on next step in treatment plan

## 2019-07-21 ENCOUNTER — Emergency Department (HOSPITAL_COMMUNITY): Payer: BC Managed Care – PPO

## 2019-07-21 ENCOUNTER — Observation Stay (HOSPITAL_COMMUNITY)
Admission: EM | Admit: 2019-07-21 | Discharge: 2019-07-23 | Disposition: A | Discharge status: Home / Self Care | Payer: BC Managed Care – PPO | Attending: Family Medicine | Admitting: Family Medicine

## 2019-07-21 ENCOUNTER — Other Ambulatory Visit: Payer: Self-pay

## 2019-07-21 ENCOUNTER — Encounter (HOSPITAL_COMMUNITY): Payer: Self-pay

## 2019-07-21 DIAGNOSIS — E860 Dehydration: Secondary | ICD-10-CM | POA: Insufficient documentation

## 2019-07-21 DIAGNOSIS — Z79899 Other long term (current) drug therapy: Secondary | ICD-10-CM | POA: Diagnosis not present

## 2019-07-21 DIAGNOSIS — I8510 Secondary esophageal varices without bleeding: Secondary | ICD-10-CM | POA: Diagnosis not present

## 2019-07-21 DIAGNOSIS — K7581 Nonalcoholic steatohepatitis (NASH): Secondary | ICD-10-CM | POA: Insufficient documentation

## 2019-07-21 DIAGNOSIS — K766 Portal hypertension: Secondary | ICD-10-CM | POA: Insufficient documentation

## 2019-07-21 DIAGNOSIS — I10 Essential (primary) hypertension: Secondary | ICD-10-CM | POA: Diagnosis not present

## 2019-07-21 DIAGNOSIS — D649 Anemia, unspecified: Secondary | ICD-10-CM | POA: Diagnosis not present

## 2019-07-21 DIAGNOSIS — I8500 Esophageal varices without bleeding: Secondary | ICD-10-CM | POA: Diagnosis present

## 2019-07-21 DIAGNOSIS — K219 Gastro-esophageal reflux disease without esophagitis: Secondary | ICD-10-CM

## 2019-07-21 DIAGNOSIS — K228 Other specified diseases of esophagus: Secondary | ICD-10-CM | POA: Insufficient documentation

## 2019-07-21 DIAGNOSIS — Z20828 Contact with and (suspected) exposure to other viral communicable diseases: Secondary | ICD-10-CM | POA: Diagnosis not present

## 2019-07-21 DIAGNOSIS — K210 Gastro-esophageal reflux disease with esophagitis: Principal | ICD-10-CM | POA: Insufficient documentation

## 2019-07-21 DIAGNOSIS — E11649 Type 2 diabetes mellitus with hypoglycemia without coma: Secondary | ICD-10-CM | POA: Diagnosis not present

## 2019-07-21 DIAGNOSIS — D696 Thrombocytopenia, unspecified: Secondary | ICD-10-CM | POA: Diagnosis not present

## 2019-07-21 DIAGNOSIS — R0789 Other chest pain: Secondary | ICD-10-CM

## 2019-07-21 DIAGNOSIS — K7460 Unspecified cirrhosis of liver: Secondary | ICD-10-CM | POA: Diagnosis present

## 2019-07-21 DIAGNOSIS — K3189 Other diseases of stomach and duodenum: Secondary | ICD-10-CM | POA: Diagnosis not present

## 2019-07-21 DIAGNOSIS — R1310 Dysphagia, unspecified: Secondary | ICD-10-CM | POA: Diagnosis not present

## 2019-07-21 DIAGNOSIS — E119 Type 2 diabetes mellitus without complications: Secondary | ICD-10-CM

## 2019-07-21 DIAGNOSIS — M1990 Unspecified osteoarthritis, unspecified site: Secondary | ICD-10-CM | POA: Diagnosis not present

## 2019-07-21 DIAGNOSIS — Z888 Allergy status to other drugs, medicaments and biological substances status: Secondary | ICD-10-CM | POA: Insufficient documentation

## 2019-07-21 DIAGNOSIS — Z7984 Long term (current) use of oral hypoglycemic drugs: Secondary | ICD-10-CM | POA: Diagnosis not present

## 2019-07-21 LAB — GLUCOSE, CAPILLARY: Glucose-Capillary: 62 mg/dL — ABNORMAL LOW (ref 70–99)

## 2019-07-21 LAB — CBC
HCT: 37.7 % — ABNORMAL LOW (ref 39.0–52.0)
Hemoglobin: 12.5 g/dL — ABNORMAL LOW (ref 13.0–17.0)
MCH: 33.2 pg (ref 26.0–34.0)
MCHC: 33.2 g/dL (ref 30.0–36.0)
MCV: 100 fL (ref 80.0–100.0)
Platelets: 59 10*3/uL — ABNORMAL LOW (ref 150–400)
RBC: 3.77 MIL/uL — ABNORMAL LOW (ref 4.22–5.81)
RDW: 13.7 % (ref 11.5–15.5)
WBC: 5.3 10*3/uL (ref 4.0–10.5)
nRBC: 0 % (ref 0.0–0.2)

## 2019-07-21 LAB — BASIC METABOLIC PANEL
Anion gap: 13 (ref 5–15)
BUN: 17 mg/dL (ref 6–20)
CO2: 23 mmol/L (ref 22–32)
Calcium: 9.4 mg/dL (ref 8.9–10.3)
Chloride: 105 mmol/L (ref 98–111)
Creatinine, Ser: 0.86 mg/dL (ref 0.61–1.24)
GFR calc Af Amer: >60 mL/min (ref >60)
GFR calc non Af Amer: >60 mL/min (ref >60)
Glucose, Bld: 87 mg/dL (ref 70–99)
Potassium: 4 mmol/L (ref 3.5–5.1)
Sodium: 141 mmol/L (ref 135–145)

## 2019-07-21 LAB — SARS CORONAVIRUS 2 BY RT PCR (HOSPITAL ORDER, PERFORMED IN ~~LOC~~ HOSPITAL LAB): SARS Coronavirus 2: NEGATIVE

## 2019-07-21 LAB — TROPONIN I (HIGH SENSITIVITY)
Troponin I (High Sensitivity): 3 ng/L (ref <18)
Troponin I (High Sensitivity): 3 ng/L (ref <18)

## 2019-07-21 IMAGING — CR CHEST - 2 VIEW
2 series · 2 of 2 positions shown · non-contrast
Comparison: None.

CLINICAL DATA: Intermittent mid to lower chest pain. Epigastric
pain.

EXAM:
CHEST - 2 VIEW

[w chest pa]
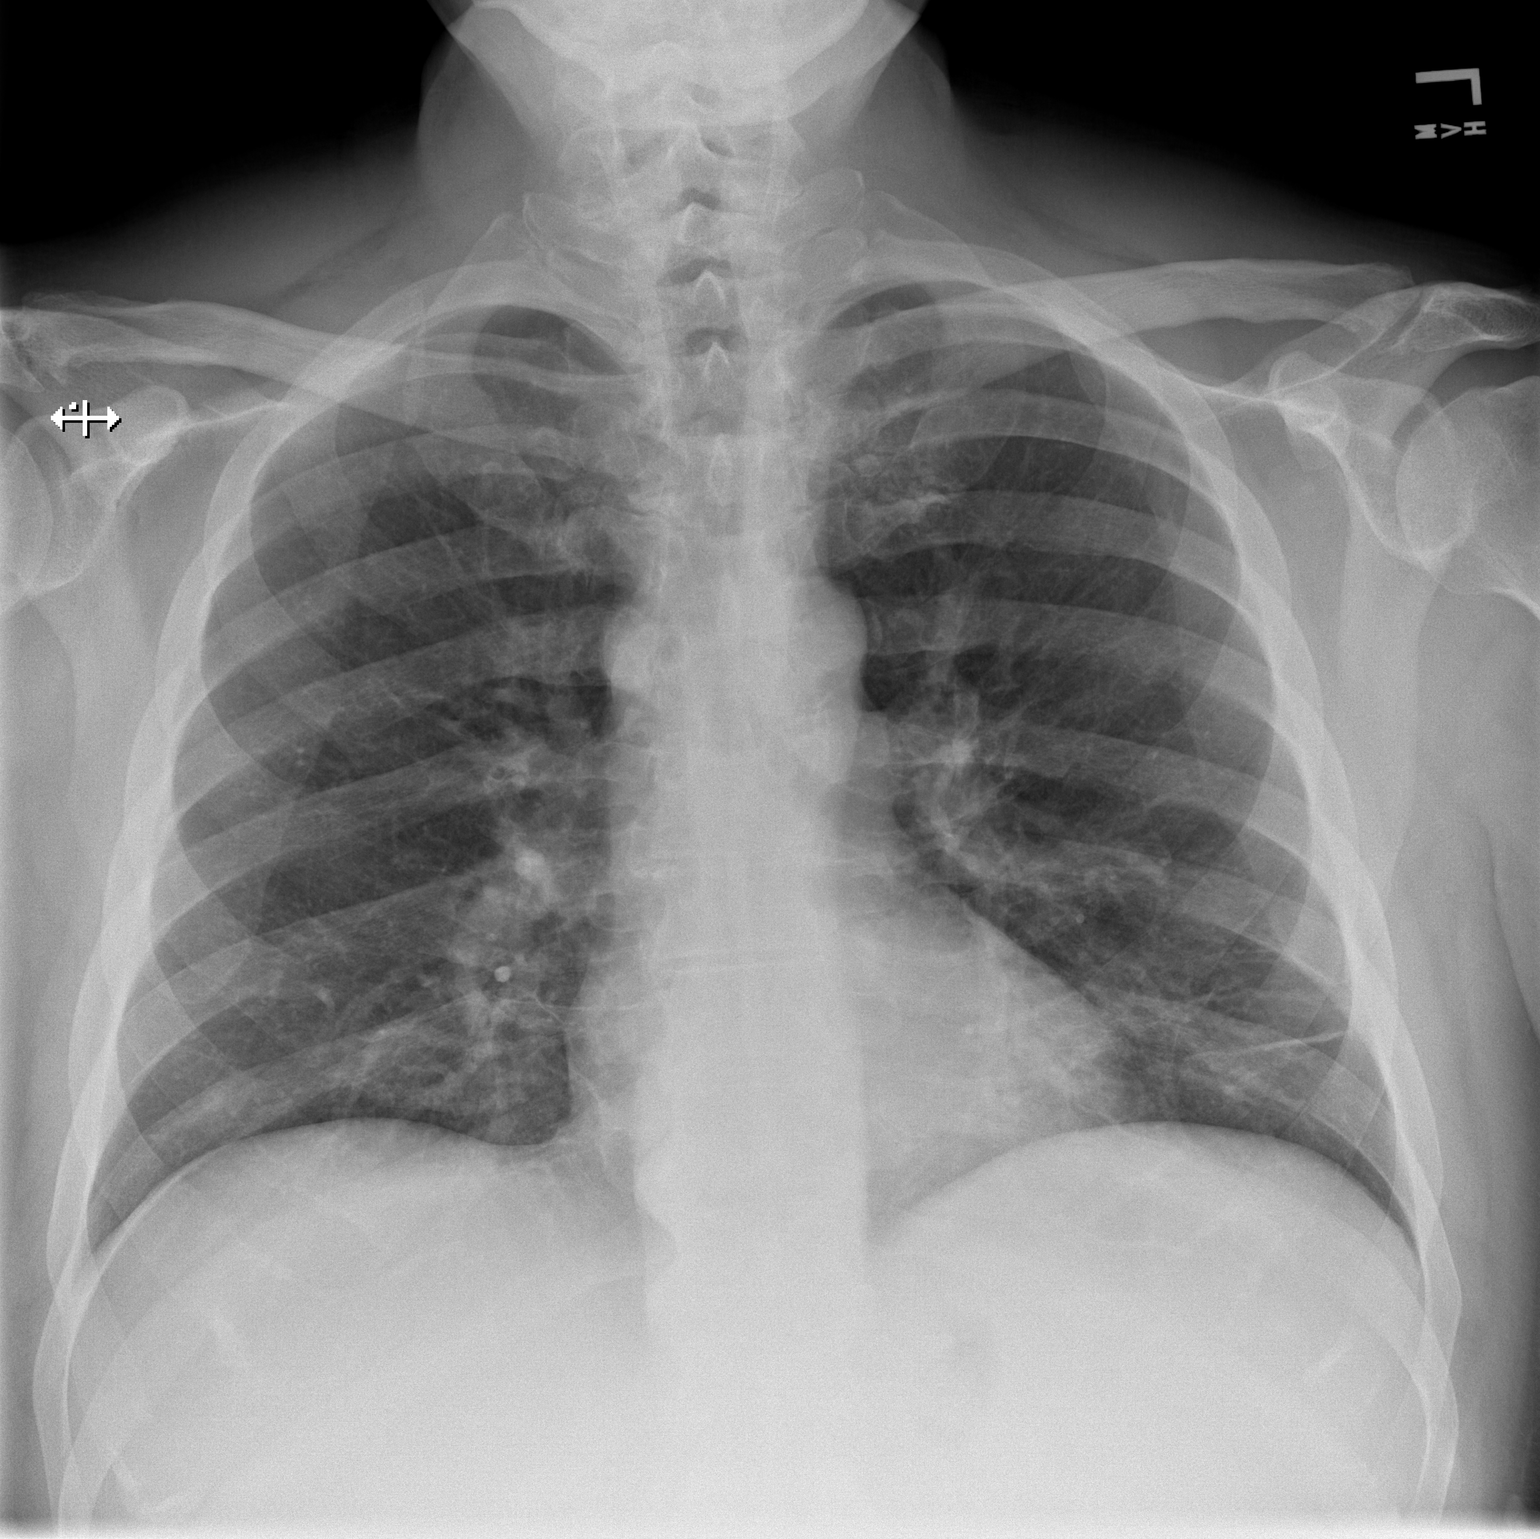

[w chest lat]
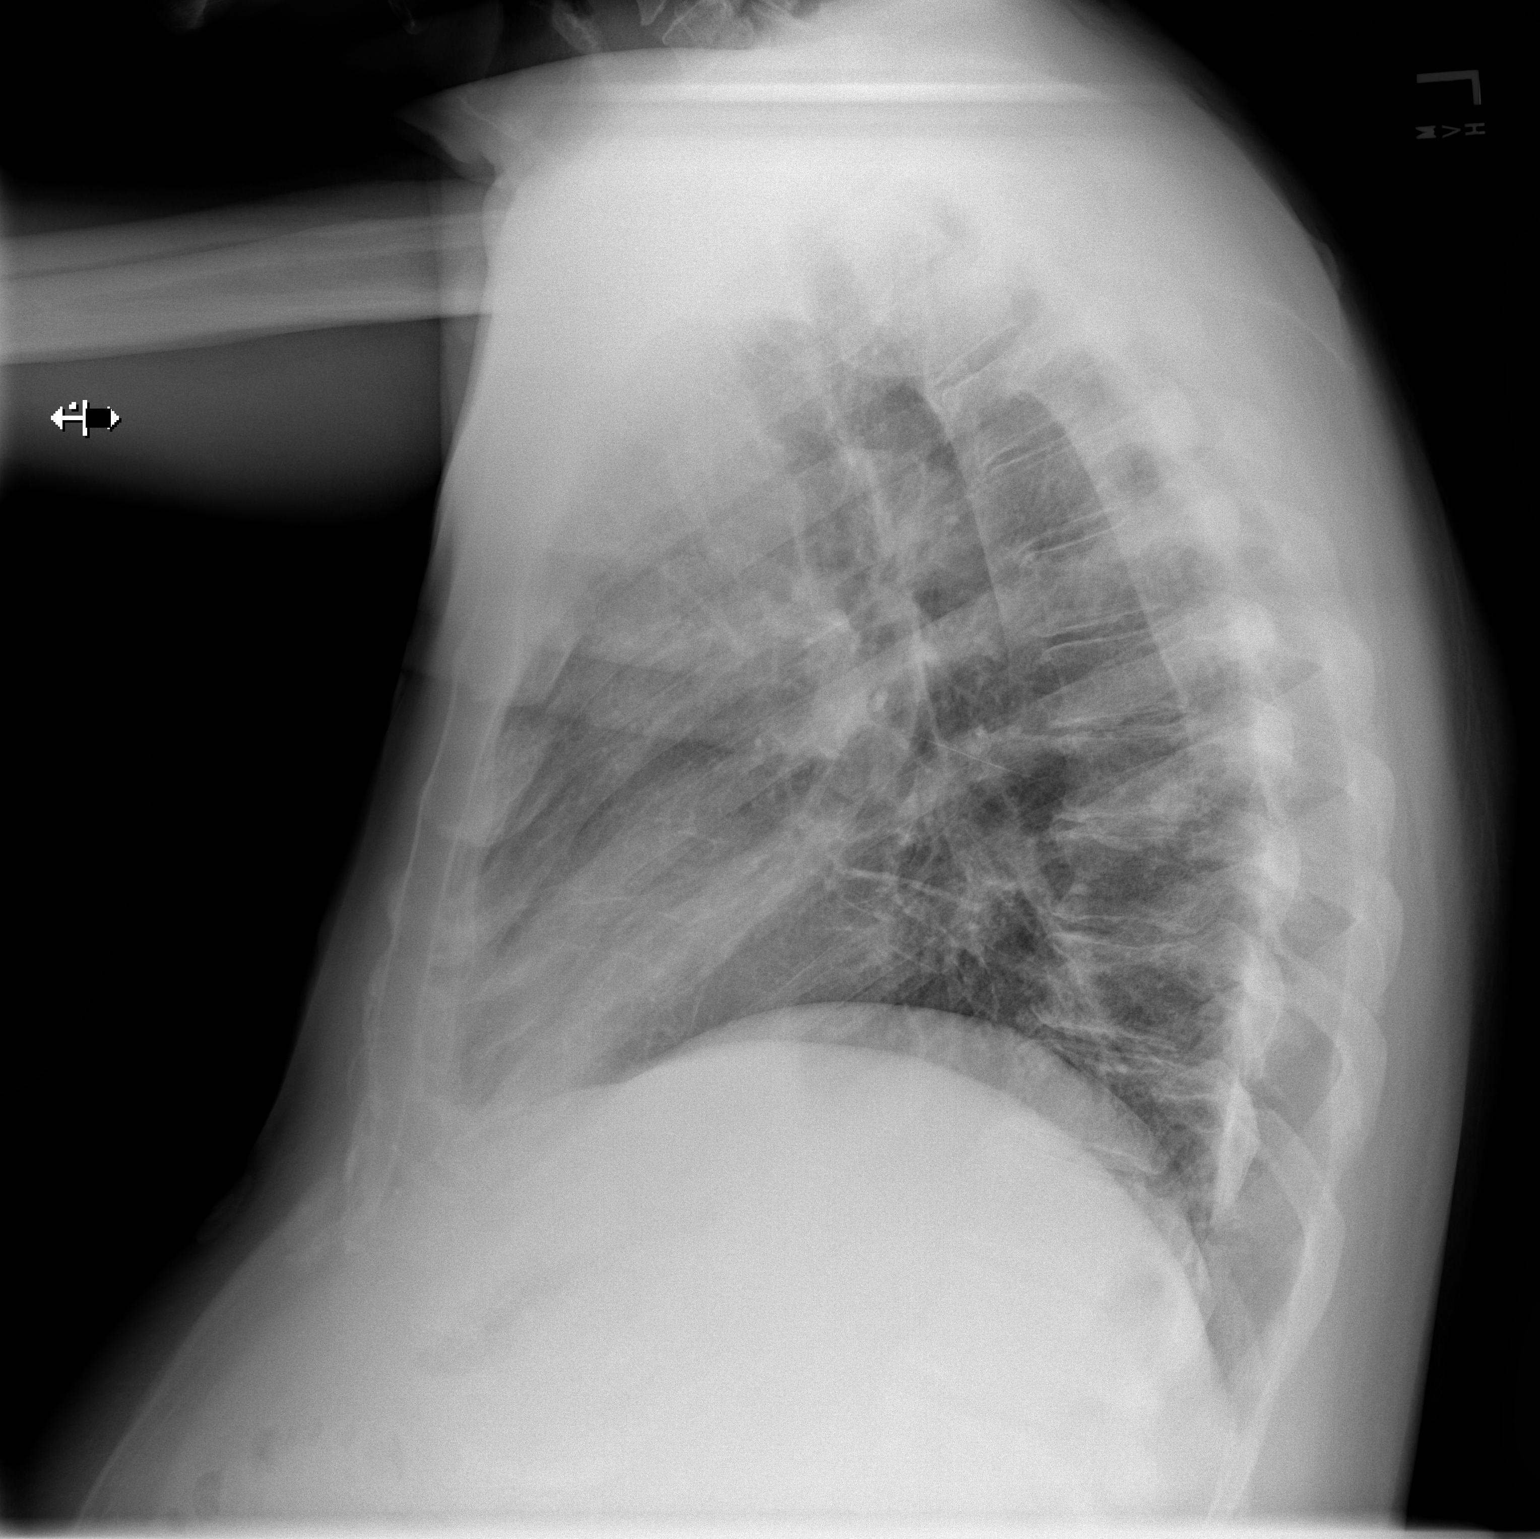

[2 of 2 positions shown; findings below may reference images not displayed]

FINDINGS: Scarring or atelectasis in the left base. The heart, hila,
mediastinum, lungs, and pleura are otherwise unremarkable.
IMPRESSION: No active cardiopulmonary disease.

## 2019-07-21 MED ORDER — SODIUM CHLORIDE 0.9 % IV BOLUS
1000.0000 mL | Freq: Once | INTRAVENOUS | Status: AC
  Administered 2019-07-21: 1000 mL via INTRAVENOUS

## 2019-07-21 MED ORDER — INSULIN ASPART 100 UNIT/ML ~~LOC~~ SOLN
0.0000 [IU] | Freq: Every day | SUBCUTANEOUS | Status: DC
  Filled 2019-07-21: qty 0.05

## 2019-07-21 MED ORDER — SODIUM CHLORIDE 0.9% FLUSH
3.0000 mL | Freq: Once | INTRAVENOUS | Status: AC
  Administered 2019-07-21: 3 mL via INTRAVENOUS

## 2019-07-21 MED ORDER — INSULIN ASPART 100 UNIT/ML ~~LOC~~ SOLN
0.0000 [IU] | Freq: Three times a day (TID) | SUBCUTANEOUS | Status: DC
  Filled 2019-07-21: qty 0.09

## 2019-07-21 NOTE — ED Triage Notes (Signed)
Patient c/o of intermittent mid/lower chest pain/epigastric pain that started Tuesday. Patient states he has not been able to keep anything down. Patient has vomited X3 today.   Last BM today and normal for patient.   Patient has nausea when he tries to eat.    Patient states he has Hx. Heart burn.  A/Ox4 Ambulatory in triage.

## 2019-07-21 NOTE — ED Notes (Addendum)
Pt put in gown and put on cardiac monitor. Pt has call bell and urinal bedside. Also provided pt with warm blanket.

## 2019-07-21 NOTE — Telephone Encounter (Signed)
Called and spoke with patient-patient advised that an OV appt has been scheduled for him on 08/02/2019 at 1:00 pm per MD verbal order; patient verbalized understanding that if symptoms worsened that he can go to the ER as the office cannot get the patient worked into the [facility] hospital schedule for endoscopy as MD verbally recommended for the patient; Patient verbalized understanding of information/instructions; patient advised to call back to the office should questions/concerns arise;

## 2019-07-21 NOTE — Telephone Encounter (Signed)
Left message for patient to call back to the office; pateint has been scheduled for an appt, per Dr. Vivia Ewing verbal order, on 08/02/2019 at 1:00 pm;

## 2019-07-21 NOTE — Telephone Encounter (Signed)
Reviewed the message from the patient. We can add him on for an EGD next week when I am inpatient, perhaps Tues or Wednesday at [facility] for evaluation along with his routine EGD with possible repeat banding. If that isnt possible, I can look to add him to the following week at [facility] sometime. Thanks.

## 2019-07-21 NOTE — Telephone Encounter (Signed)
Left message for the patient to call back to the office; Patient has been scheduled on 08/02/2019 at 1:00pm per Dr. Vivia Ewing verbal order;

## 2019-07-21 NOTE — ED Provider Notes (Signed)
[facility] DEPT Provider Note   CSN: [id number] Arrival date & time: 07/21/19  1854     History   Chief Complaint Chief Complaint  Patient presents with  . Chest Pain    HPI Martin Strong is a 53 y.o. male.     Patient complains of not being able to eat or drink anything since Tuesday.  Patient has a history of varices.  He has had his esophagus stretched before.  He states he has been throwing up everything after he eats or drinks it.  Patient also states he spoke to GI and they told him to come here and get admitted and get endoscopy  The history is provided by the patient. No language interpreter was used.  Chest Pain Pain location:  Epigastric Pain quality: aching   Pain radiates to:  Does not radiate Pain severity:  Mild Onset quality:  Sudden Timing:  Constant Progression:  Worsening Chronicity:  New Context: not breathing   Relieved by:  Nothing Worsened by:  Nothing Ineffective treatments:  None tried Associated symptoms: no abdominal pain, no back pain, no cough, no fatigue and no headache     Past Medical History:  Diagnosis Date  . Anemia   . Arthritis   . Cirrhosis ([place])   . Diabetes mellitus without complication ([place])   . Fatty liver   . GERD (gastroesophageal reflux disease)   . GI bleed   . Hyperlipidemia   . Hypertension     Patient Active Problem List   Diagnosis Date Noted  . Gastritis and gastroduodenitis   . Varices of esophagus determined by endoscopy ([place])   . Portal hypertensive gastropathy ([place])   . Acute upper GI bleed 10/06/2018  . Cirrhosis ([place]) 10/06/2018  . Hypertension 10/06/2018  . Diabetes mellitus without complication ([place]) 85/27/7824    Past Surgical History:  Procedure Laterality Date  . BIOPSY  02/14/2019   Procedure: BIOPSY;  Surgeon: Lavena Bullion, DO;  Location: [facility];  Service: Gastroenterology;;  . ESOPHAGEAL BANDING  10/07/2018   Procedure: ESOPHAGEAL BANDING;   Surgeon: Lavena Bullion, DO;  Location: [facility];  Service: Gastroenterology;;  . ESOPHAGEAL BANDING N/A 01/03/2019   Procedure: ESOPHAGEAL BANDING;  Surgeon: Lavena Bullion, DO;  [facility];  Service: Gastroenterology;  Laterality: N/A;  . ESOPHAGEAL BANDING  02/14/2019   Procedure: ESOPHAGEAL BANDING;  Surgeon: Lavena Bullion, DO;  [facility];  Service: Gastroenterology;;  . esophageal bands    . ESOPHAGOGASTRODUODENOSCOPY (EGD) WITH PROPOFOL N/A 10/07/2018   Procedure: ESOPHAGOGASTRODUODENOSCOPY (EGD) WITH PROPOFOL;  Surgeon: Lavena Bullion, DO;  Location: [facility];  Service: Gastroenterology;  Laterality: N/A;  . ESOPHAGOGASTRODUODENOSCOPY (EGD) WITH PROPOFOL N/A 01/03/2019   Procedure: ESOPHAGOGASTRODUODENOSCOPY (EGD) WITH PROPOFOL;  Surgeon: Lavena Bullion, DO;  [facility];  Service: Gastroenterology;  Laterality: N/A;  . ESOPHAGOGASTRODUODENOSCOPY (EGD) WITH PROPOFOL N/A 02/14/2019   Procedure: ESOPHAGOGASTRODUODENOSCOPY (EGD) WITH PROPOFOL;  Surgeon: Lavena Bullion, DO;  [facility];  Service: Gastroenterology;  Laterality: N/A;        Home Medications    Prior to Admission medications   Medication Sig Start Date End Date Taking? Authorizing Provider  acetaminophen (TYLENOL) 500 MG tablet Take 1,000 mg by mouth every 6 (six) hours as needed for moderate pain or headache.   Yes [provider]  cholecalciferol (VITAMIN D) 1000 units tablet Take 1,000 Units by mouth 2 (two) times daily.   Yes [provider]  dapagliflozin  propanediol (FARXIGA) 10 MG TABS tablet Take 10 mg by mouth every evening.    Yes [provider]  ferrous sulfate (KP FERROUS SULFATE) 325 (65 FE) MG tablet Take 1 tablet (325 mg total) by mouth daily with breakfast. 05/30/19  Yes Cirigliano, Vito V, DO  glipiZIDE (GLUCOTROL) 10 MG tablet Take 10 mg by mouth 2 (two) times daily.   Yes [provider]   lisinopril (PRINIVIL,ZESTRIL) 20 MG tablet Take 20 mg by mouth 2 (two) times daily.   Yes [provider]  metFORMIN (GLUCOPHAGE) 1000 MG tablet Take 1,000 mg by mouth 2 (two) times daily with a meal.   Yes [provider]  pantoprazole (PROTONIX) 40 MG tablet Take 1 tablet (40 mg total) by mouth 2 (two) times daily. 03/31/19  Yes Cirigliano, Vito V, DO  propranolol (INDERAL) 40 MG tablet Take 40 mg by mouth 2 (two) times daily.    Yes [provider]    Family History Family History  Problem Relation Age of Onset  . Diabetes Other   . Esophageal cancer Father   . Colon cancer Neg Hx   . Rectal cancer Neg Hx   . Stomach cancer Neg Hx     Social History Social History   Tobacco Use  . Smoking status: Never Smoker  . Smokeless tobacco: Never Used  Substance Use Topics  . Alcohol use: Never    Frequency: Never  . Drug use: Never     Allergies   Nadolol   Review of Systems Review of Systems  Constitutional: Negative for appetite change and fatigue.  HENT: Negative for congestion, ear discharge and sinus pressure.   Eyes: Negative for discharge.  Respiratory: Negative for cough.   Cardiovascular: Positive for chest pain.  Gastrointestinal: Negative for abdominal pain and diarrhea.  Genitourinary: Negative for frequency and hematuria.  Musculoskeletal: Negative for back pain.  Skin: Negative for rash.  Neurological: Negative for seizures and headaches.  Psychiatric/Behavioral: Negative for hallucinations.     Physical Exam Updated Vital Signs BP (!) 149/80 (BP Location: Left Arm)   Pulse 90   Temp 98.8 F (37.1 C) (Oral)   Resp 17   Ht 6' 1"  (1.854 m)   Wt 102.1 kg   SpO2 97%   BMI 29.69 kg/m   Physical Exam Vitals signs and nursing note reviewed.  Constitutional:      Appearance: He is well-developed.  HENT:     Head: Normocephalic.     Nose: Nose normal.  Eyes:     General: No scleral icterus.    Conjunctiva/sclera:  Conjunctivae normal.  Neck:     Musculoskeletal: Neck supple.     Thyroid: No thyromegaly.  Cardiovascular:     Rate and Rhythm: Normal rate and regular rhythm.     Heart sounds: No murmur. No friction rub. No gallop.   Pulmonary:     Breath sounds: No stridor. No wheezing or rales.  Chest:     Chest wall: No tenderness.  Abdominal:     General: There is no distension.     Tenderness: There is no abdominal tenderness. There is no rebound.  Musculoskeletal: Normal range of motion.  Lymphadenopathy:     Cervical: No cervical adenopathy.  Skin:    Findings: No erythema or rash.  Neurological:     Mental Status: He is oriented to person, place, and time.     Motor: No abnormal muscle tone.     Coordination: Coordination normal.  Psychiatric:  Behavior: Behavior normal.      ED Treatments / Results  Labs (all labs ordered are listed, but only abnormal results are displayed) Labs Reviewed  CBC - Abnormal; Notable for the following components:      Result Value   RBC 3.77 (*)    Hemoglobin 12.5 (*)    HCT 37.7 (*)    Platelets 59 (*)    All other components within normal limits  SARS CORONAVIRUS 2 (HOSPITAL ORDER, [facility])  BASIC METABOLIC PANEL  TROPONIN I (HIGH SENSITIVITY)  TROPONIN I (HIGH SENSITIVITY)    EKG EKG Interpretation  Date/Time:  Friday July 21 2019 19:06:42 EDT Ventricular Rate:  92 PR Interval:  146 QRS Duration: 84 QT Interval:  376 QTC Calculation: 464 R Axis:   37 Text Interpretation:  Normal sinus rhythm Normal ECG Confirmed by [name] [phone]) on 07/21/2019 9:03:52 PM   Radiology Dg Chest 2 View  Result Date: 07/21/2019 CLINICAL DATA:  Intermittent mid to lower chest pain. Epigastric pain. EXAM: CHEST - 2 VIEW COMPARISON:  None. FINDINGS: Scarring or atelectasis in the left base. The heart, hila, mediastinum, lungs, and pleura are otherwise unremarkable. IMPRESSION: No active cardiopulmonary disease.  Electronically Signed   By: Dorise Bullion III M.D   On: 07/21/2019 19:36    Procedures Procedures (including critical care time)  Medications Ordered in ED Medications  sodium chloride flush (NS) 0.9 % injection 3 mL (has no administration in time range)  sodium chloride 0.9 % bolus 1,000 mL (1,000 mLs Intravenous New Bag/Given 07/21/19 2204)     Initial Impression / Assessment and Plan / ED Course  I have reviewed the triage vital signs and the nursing notes.  Pertinent labs & imaging results that were available during my care of the patient were reviewed by me and considered in my medical decision making (see chart for details).    Labs unremarkable    I spoke with his GI doctor at [facility] and he recommended medicine admission and they will do endoscopy tomorrow  Final Clinical Impressions(s) / ED Diagnoses   Final diagnoses:  Atypical chest pain    ED Discharge Orders    None       [name], MD 07/21/19 2207

## 2019-07-21 NOTE — H&P (Signed)
History and Physical  Martin Strong DDU:[id number] DOB: 1966-07-30 DOA: 07/21/2019  Referring physician: [name] PCP: [name], NP  Patient coming from: Home & is able to ambulate   Chief Complaint: Lower midsternal chest pain and epigastric pain  HPI: Martin Strong is a 53 y.o. male with medical history significant for cirrhosis with esophageal varices who presents to the emergency department due to being unable to eat or drink since Tuesday (8/4).  Patient states that whenever he eats or drinks, he feels choked and has been vomiting any ingested food or drink.  He states that he spoke to GI and was told to come to the emergency department so that he can be admitted for an endoscopy procedure. Patient had prior EGD in March 2020 and grade 2 varices was noted in lower third of esophagus, he also had an EGD in January 2020 where varices with esophageal banding was placed.  He denies fever, chills, vomiting of blood, headache, shortness of breath or abdominal pain.  ED Course:  BP in the emergency department was 162/83 and other vital signs are within normal range.  CBC showed an anemia and BMP was normal.  Chest x-ray showed no acute cardiopulmonary disease.  IV hydration was given.  Gastroenterologist (Dr. Bryan Lemma) was consulted per ED physician and will see patient in the morning with plan to possibly do an EGD  Review of Systems: Constitutional: Negative for chills and fever.  HENT: Negative for ear pain and sore throat.   Eyes: Negative for pain and visual disturbance.  Respiratory: Negative for cough, chest tightness and shortness of breath.   Cardiovascular: Positive for lower midsternal chest pain. Negative for palpitations.  Gastrointestinal: Negative for abdominal pain and vomiting.  Endocrine: Negative for polyphagia and polyuria.  Genitourinary: Negative for decreased urine volume, dysuria.  Musculoskeletal: Negative for arthralgias and back pain.  Skin:  Negative for color change and rash.  Allergic/Immunologic: Negative for immunocompromised state.  Neurological: Negative for tremors, syncope, speech difficulty, weakness, light-headedness and headaches.  Hematological: Does not bruise/bleed easily.  All other systems reviewed and are negative  Past Medical History:  Diagnosis Date  . Anemia   . Arthritis   . Cirrhosis ([place])   . Diabetes mellitus without complication ([place])   . Fatty liver   . GERD (gastroesophageal reflux disease)   . GI bleed   . Hyperlipidemia   . Hypertension    Past Surgical History:  Procedure Laterality Date  . BIOPSY  02/14/2019   Procedure: BIOPSY;  Surgeon: Lavena Bullion, DO;  [facility];  Service: Gastroenterology;;  . ESOPHAGEAL BANDING  10/07/2018   Procedure: ESOPHAGEAL BANDING;  Surgeon: Lavena Bullion, DO;  Location: [facility];  Service: Gastroenterology;;  . ESOPHAGEAL BANDING N/A 01/03/2019   Procedure: ESOPHAGEAL BANDING;  Surgeon: Lavena Bullion, DO;  [facility];  Service: Gastroenterology;  Laterality: N/A;  . ESOPHAGEAL BANDING  02/14/2019   Procedure: ESOPHAGEAL BANDING;  Surgeon: Lavena Bullion, DO;  [facility];  Service: Gastroenterology;;  . esophageal bands    . ESOPHAGOGASTRODUODENOSCOPY (EGD) WITH PROPOFOL N/A 10/07/2018   Procedure: ESOPHAGOGASTRODUODENOSCOPY (EGD) WITH PROPOFOL;  Surgeon: Lavena Bullion, DO;  Location: [facility];  Service: Gastroenterology;  Laterality: N/A;  . ESOPHAGOGASTRODUODENOSCOPY (EGD) WITH PROPOFOL N/A 01/03/2019   Procedure: ESOPHAGOGASTRODUODENOSCOPY (EGD) WITH PROPOFOL;  Surgeon: Lavena Bullion, DO;  [facility];  Service: Gastroenterology;  Laterality: N/A;  . ESOPHAGOGASTRODUODENOSCOPY (EGD) WITH PROPOFOL N/A 02/14/2019   Procedure:  ESOPHAGOGASTRODUODENOSCOPY (EGD) WITH PROPOFOL;  Surgeon: Lavena Bullion, DO;  [facility];  Service: Gastroenterology;  Laterality: N/A;     Social History:  reports that he has never smoked. He has never used smokeless tobacco. He reports that he does not drink alcohol or use drugs.   Allergies  Allergen Reactions  . Nadolol Itching and Nausea Only    Family History  Problem Relation Age of Onset  . Diabetes Other   . Esophageal cancer Father   . Colon cancer Neg Hx   . Rectal cancer Neg Hx   . Stomach cancer Neg Hx       Prior to Admission medications   Medication Sig Start Date End Date Taking? Authorizing Provider  acetaminophen (TYLENOL) 500 MG tablet Take 1,000 mg by mouth every 6 (six) hours as needed for moderate pain or headache.   Yes [provider]  cholecalciferol (VITAMIN D) 1000 units tablet Take 1,000 Units by mouth 2 (two) times daily.   Yes [provider]  dapagliflozin propanediol (FARXIGA) 10 MG TABS tablet Take 10 mg by mouth every evening.    Yes [provider]  ferrous sulfate (KP FERROUS SULFATE) 325 (65 FE) MG tablet Take 1 tablet (325 mg total) by mouth daily with breakfast. 05/30/19  Yes Cirigliano, Vito V, DO  glipiZIDE (GLUCOTROL) 10 MG tablet Take 10 mg by mouth 2 (two) times daily.   Yes [provider]  lisinopril (PRINIVIL,ZESTRIL) 20 MG tablet Take 20 mg by mouth 2 (two) times daily.   Yes [provider]  metFORMIN (GLUCOPHAGE) 1000 MG tablet Take 1,000 mg by mouth 2 (two) times daily with a meal.   Yes [provider]  pantoprazole (PROTONIX) 40 MG tablet Take 1 tablet (40 mg total) by mouth 2 (two) times daily. 03/31/19  Yes Cirigliano, Vito V, DO  propranolol (INDERAL) 40 MG tablet Take 40 mg by mouth 2 (two) times daily.    Yes [provider]    Physical Exam: BP (!) 144/70 (BP Location: Left Arm)   Pulse 92   Temp 98.8 F (37.1 C) (Oral)   Resp 11   Ht 6' 1"  (1.854 m)   Wt 102.1 kg   SpO2 98%   BMI 29.69 kg/m   . General: 53 y.o. year-old male well developed well nourished in no acute distress.  Alert and  oriented x3. Marland Kitchen HEENT: Normocephalic, atraumatic, dry mucous membrane, PERRLA . Neck: Supple, trachea midline . Cardiovascular: Regular rate and rhythm with no rubs or gallops.  No thyromegaly or JVD noted.  No lower extremity edema. 2/4 pulses in all 4 extremities. Marland Kitchen Respiratory: Clear to auscultation with no wheezes or rales. Good inspiratory effort. . Abdomen: Soft nontender nondistended with normal bowel sounds x4 quadrants. . Muskuloskeletal: No cyanosis, clubbing or edema noted bilaterally . Neuro: CN II-XII intact, strength, sensation, reflexes . Skin: No ulcerative lesions noted or rashes . Psychiatry: Judgement and insight appear normal. Mood is appropriate for condition and setting          Labs on Admission:  Basic Metabolic Panel: Recent Labs  Lab 07/21/19 1953  NA 141  K 4.0  CL 105  CO2 23  GLUCOSE 87  BUN 17  CREATININE 0.86  CALCIUM 9.4   Liver Function Tests: No results for input(s): AST, ALT, ALKPHOS, BILITOT, PROT, ALBUMIN in the last 168 hours. No results for input(s): LIPASE, AMYLASE in the last 168 hours. No results for input(s): AMMONIA in the last  168 hours. CBC: Recent Labs  Lab 07/21/19 1953  WBC 5.3  HGB 12.5*  HCT 37.7*  MCV 100.0  PLT 59*   Cardiac Enzymes: No results for input(s): CKTOTAL, CKMB, CKMBINDEX, TROPONINI in the last 168 hours.  BNP (last 3 results) No results for input(s): BNP in the last 8760 hours.  ProBNP (last 3 results) No results for input(s): PROBNP in the last 8760 hours.  CBG: No results for input(s): GLUCAP in the last 168 hours.  Radiological Exams on Admission: Dg Chest 2 View  Result Date: 07/21/2019 CLINICAL DATA:  Intermittent mid to lower chest pain. Epigastric pain. EXAM: CHEST - 2 VIEW COMPARISON:  None. FINDINGS: Scarring or atelectasis in the left base. The heart, hila, mediastinum, lungs, and pleura are otherwise unremarkable. IMPRESSION: No active cardiopulmonary disease. Electronically Signed   By:  Dorise Bullion III M.D   On: 07/21/2019 19:36    EKG: I independently viewed the EKG done and my findings are as followed: Normal sinus rhythm at a rate of 92bpm  Assessment/Plan Present on Admission: . Esophageal varices ([place]) . Cirrhosis ([place]) . Hypertension  Active Problems:   Cirrhosis ([place])   Hypertension   Diabetes mellitus without complication ([place])   Esophageal varices (HCC)   Dysphagia   GERD (gastroesophageal reflux disease)  Dysphagia possibly secondary to esophageal varices/ Cirrhosis Patient presents with 3-day onset of dysphagia with difficulty in being able to tolerate food/drink. Gastroenterologist (Dr. Bryan Lemma) already consulted per ED physician and will see patient in the morning for possible ED Continue n.p.o. after midnight  Continue home beta-blocker for esophageal varices after med rec is updated  Dehydration Continue IV hydration  Hypertension  Continue propranolol and lisinopril per home regimen as tolerated  GERD Continue IV Protonix  Type II diabetes mellitus Continue insulin sliding scale and hypoglycemia protocol Metformin, farxiga and glipizide will be held at this time   DVT prophylaxis: SCDs  Code Status: Full  Family Communication: None at bedside  Disposition Plan: Home based on clinical status after endoscopic procedure  Consults called: Gastroenterologist(Dr. Bryan Lemma) per ED physician  Admission status: Full    Bernadette Hoit MD [facility]  If 7PM-7AM, please contact night-coverage [email or web address]   07/21/2019, 10:34 PM

## 2019-07-22 ENCOUNTER — Encounter (HOSPITAL_COMMUNITY)
Admission: EM | Disposition: A | Discharge status: Home / Self Care | Payer: Self-pay | Source: Home / Self Care | Attending: Emergency Medicine

## 2019-07-22 ENCOUNTER — Encounter (HOSPITAL_COMMUNITY): Payer: Self-pay | Admitting: Gastroenterology

## 2019-07-22 ENCOUNTER — Other Ambulatory Visit: Payer: Self-pay

## 2019-07-22 ENCOUNTER — Observation Stay (HOSPITAL_COMMUNITY): Payer: BC Managed Care – PPO | Admitting: Certified Registered Nurse Anesthetist

## 2019-07-22 DIAGNOSIS — E119 Type 2 diabetes mellitus without complications: Secondary | ICD-10-CM

## 2019-07-22 DIAGNOSIS — K766 Portal hypertension: Secondary | ICD-10-CM | POA: Diagnosis not present

## 2019-07-22 DIAGNOSIS — K7460 Unspecified cirrhosis of liver: Secondary | ICD-10-CM | POA: Diagnosis not present

## 2019-07-22 DIAGNOSIS — R1310 Dysphagia, unspecified: Secondary | ICD-10-CM | POA: Diagnosis not present

## 2019-07-22 DIAGNOSIS — I8510 Secondary esophageal varices without bleeding: Secondary | ICD-10-CM

## 2019-07-22 DIAGNOSIS — E162 Hypoglycemia, unspecified: Secondary | ICD-10-CM | POA: Diagnosis not present

## 2019-07-22 DIAGNOSIS — K219 Gastro-esophageal reflux disease without esophagitis: Secondary | ICD-10-CM | POA: Diagnosis not present

## 2019-07-22 HISTORY — PX: BIOPSY: SHX5522

## 2019-07-22 HISTORY — PX: ESOPHAGOGASTRODUODENOSCOPY: SHX5428

## 2019-07-22 LAB — COMPREHENSIVE METABOLIC PANEL
ALT: 31 U/L (ref 0–44)
AST: 38 U/L (ref 15–41)
Albumin: 3.7 g/dL (ref 3.5–5.0)
Alkaline Phosphatase: 52 U/L (ref 38–126)
Anion gap: 14 (ref 5–15)
BUN: 16 mg/dL (ref 6–20)
CO2: 19 mmol/L — ABNORMAL LOW (ref 22–32)
Calcium: 8.7 mg/dL — ABNORMAL LOW (ref 8.9–10.3)
Chloride: 107 mmol/L (ref 98–111)
Creatinine, Ser: 0.79 mg/dL (ref 0.61–1.24)
GFR calc Af Amer: >60 mL/min (ref >60)
GFR calc non Af Amer: >60 mL/min (ref >60)
Glucose, Bld: 63 mg/dL — ABNORMAL LOW (ref 70–99)
Potassium: 3.8 mmol/L (ref 3.5–5.1)
Sodium: 140 mmol/L (ref 135–145)
Total Bilirubin: 1.7 mg/dL — ABNORMAL HIGH (ref 0.3–1.2)
Total Protein: 6.9 g/dL (ref 6.5–8.1)

## 2019-07-22 LAB — CBC
HCT: 35 % — ABNORMAL LOW (ref 39.0–52.0)
Hemoglobin: 11.6 g/dL — ABNORMAL LOW (ref 13.0–17.0)
MCH: 33.7 pg (ref 26.0–34.0)
MCHC: 33.1 g/dL (ref 30.0–36.0)
MCV: 101.7 fL — ABNORMAL HIGH (ref 80.0–100.0)
Platelets: 47 10*3/uL — ABNORMAL LOW (ref 150–400)
RBC: 3.44 MIL/uL — ABNORMAL LOW (ref 4.22–5.81)
RDW: 13.8 % (ref 11.5–15.5)
WBC: 4.2 10*3/uL (ref 4.0–10.5)
nRBC: 0 % (ref 0.0–0.2)

## 2019-07-22 LAB — GLUCOSE, CAPILLARY
Glucose-Capillary: 118 mg/dL — ABNORMAL HIGH (ref 70–99)
Glucose-Capillary: 43 mg/dL — CL (ref 70–99)
Glucose-Capillary: 70 mg/dL (ref 70–99)
Glucose-Capillary: 56 mg/dL — ABNORMAL LOW (ref 70–99)
Glucose-Capillary: 103 mg/dL — ABNORMAL HIGH (ref 70–99)
Glucose-Capillary: 78 mg/dL (ref 70–99)
Glucose-Capillary: 89 mg/dL (ref 70–99)
Glucose-Capillary: 132 mg/dL — ABNORMAL HIGH (ref 70–99)

## 2019-07-22 LAB — HEMOGLOBIN A1C
Hgb A1c MFr Bld: 7.4 % — ABNORMAL HIGH (ref 4.8–5.6)
Mean Plasma Glucose: 165.68 mg/dL

## 2019-07-22 SURGERY — EGD (ESOPHAGOGASTRODUODENOSCOPY)
Anesthesia: Monitor Anesthesia Care | Laterality: Left

## 2019-07-22 MED ORDER — DEXTROSE-NACL 5-0.45 % IV SOLN
INTRAVENOUS | Status: DC
  Administered 2019-07-22 – 2019-07-23 (×2): via INTRAVENOUS

## 2019-07-22 MED ORDER — DEXTROSE 50 % IV SOLN
INTRAVENOUS | Status: AC
  Administered 2019-07-22: 50 mL
  Filled 2019-07-22: qty 50

## 2019-07-22 MED ORDER — SODIUM CHLORIDE 0.9 % IV SOLN
Freq: Once | INTRAVENOUS | Status: AC
  Administered 2019-07-22: 02:00:00 via INTRAVENOUS

## 2019-07-22 MED ORDER — LACTATED RINGERS IV SOLN
INTRAVENOUS | Status: DC
  Administered 2019-07-22 (×2): via INTRAVENOUS

## 2019-07-22 MED ORDER — PROPOFOL 500 MG/50ML IV EMUL
INTRAVENOUS | Status: DC | PRN
  Administered 2019-07-22: 125 ug/kg/min via INTRAVENOUS

## 2019-07-22 MED ORDER — PANTOPRAZOLE SODIUM 40 MG IV SOLR
40.0000 mg | Freq: Two times a day (BID) | INTRAVENOUS | Status: DC
  Administered 2019-07-22 – 2019-07-23 (×2): 40 mg via INTRAVENOUS
  Filled 2019-07-22 (×2): qty 40

## 2019-07-22 MED ORDER — PANTOPRAZOLE SODIUM 40 MG IV SOLR
40.0000 mg | INTRAVENOUS | Status: DC
  Administered 2019-07-22: 40 mg via INTRAVENOUS
  Filled 2019-07-22: qty 40

## 2019-07-22 MED ORDER — PROPRANOLOL HCL 20 MG PO TABS
40.0000 mg | ORAL_TABLET | Freq: Two times a day (BID) | ORAL | Status: DC
  Administered 2019-07-22 – 2019-07-23 (×2): 40 mg via ORAL
  Filled 2019-07-22 (×4): qty 2

## 2019-07-22 MED ORDER — DEXTROSE 50 % IV SOLN
12.5000 g | INTRAVENOUS | Status: AC
  Administered 2019-07-22: 12.5 g via INTRAVENOUS

## 2019-07-22 MED ORDER — SUCRALFATE 1 GM/10ML PO SUSP
1.0000 g | Freq: Three times a day (TID) | ORAL | Status: DC
  Administered 2019-07-22 – 2019-07-23 (×4): 1 g via ORAL
  Filled 2019-07-22 (×4): qty 10

## 2019-07-22 MED ORDER — SODIUM CHLORIDE 0.9 % IV SOLN: INTRAVENOUS | Status: DC

## 2019-07-22 MED ORDER — DEXTROSE 50 % IV SOLN
INTRAVENOUS | Status: AC
  Filled 2019-07-22: qty 50

## 2019-07-22 MED ORDER — PROPOFOL 10 MG/ML IV BOLUS
INTRAVENOUS | Status: AC
  Filled 2019-07-22: qty 40

## 2019-07-22 MED ORDER — PROPOFOL 10 MG/ML IV BOLUS
INTRAVENOUS | Status: AC
  Filled 2019-07-22: qty 20

## 2019-07-22 MED ORDER — PROPOFOL 10 MG/ML IV BOLUS
INTRAVENOUS | Status: DC | PRN
  Administered 2019-07-22 (×3): 30 mg via INTRAVENOUS

## 2019-07-22 NOTE — Progress Notes (Signed)
PROGRESS NOTE  Martin Strong [id number] DOB: 1966/07/27 DOA: 07/21/2019 PCP: [name], NP  Brief History   53 year old man PMH cirrhosis, esophageal varices presented to the emergency department secondary to inability to eat or drink since 8/4.  Whenever attempting to eat or drink patient feels choking sensation and vomits.  Admitted for dysphagia, GI evaluation, possible endoscopy.  A & P  Dysphagia, inability to eat, regurgitation of food.  EGD showed grade 1 esophageal varices, scar in the lower third of the esophagus, esophagitis, medium amount of food in the stomach.  Nodular mucosa in the stomach and duodenum, biopsied. --As per gastroenterology full liquid diet today, continue Protonix IV, continue sucralfate  Dehydration --Appears resolved.  NASH cirrhosis with associated thrombocytopenia, esophageal varices, portal hypertensive gastropathy --Appears compensated.  Continue propranolol  Hypoglycemia, diabetes mellitus type 2 --Hold oral medications including dapagliflozin, glipizide, metformin --Follow CBG, continue D5 half-normal saline given poor oral intake  The patient has had hypoglycemia earlier today, has poor oral intake secondary to esophageal issues, is just on liquid diet today, needs ongoing dextrose support and monitoring as well as IV PPI treatment.  DVT prophylaxis: SCDs Code Status: Full Family Communication: none Disposition Plan: home    Murray Hodgkins, MD  [facility] Direct contact: see [email or web address] (further directions at bottom of note if needed) 7PM-7AM contact night coverage as at bottom of note 07/22/2019, 8:42 AM  LOS: 0 days   Consultants  . GI  Procedures  . EGD Impression:               - Grade I esophageal varices.                           - Scar in the lower third of the esophagus.                           - Esophagitis.                           - A medium amount of food (residue) in the stomach.                            - Portal hypertensive gastropathy. Biopsied.                           - Nodular mucosa in the gastric antrum. Biopsied.                           - Nodular mucosa in the duodenal bulb. Biopsied.                           - Normal mucosa was found in the second portion of                            the duodenum.  Antibiotics  .   Interval History/Subjective  Feels okay.  No pain.  Objective   Vitals:  Vitals:   07/22/19 0146 07/22/19 0520  BP: (!) 148/83 113/61  Pulse: 89 92  Resp:  16  Temp: 97.6 F (36.4 C) 98.1 F (36.7 C)  SpO2: 98% 97%    Exam:  Constitutional:  .  Appears calm and comfortable Respiratory:  . CTA bilaterally, no w/r/r.  . Respiratory effort normal.  Cardiovascular:  . RRR, no m/r/g . No LE extremity edema   Abdomen:  . Soft, nontender Psychiatric:  . Mental status o Mood, affect appropriate  I have personally reviewed the following:   Today's Data  . Hypoglycemia 63 early in the morning, 56 at lunch . BMP otherwise unremarkable.  LFTs notable for isolated hyperbilirubinemia 1.7. . Hemoglobin stable 11.6.  Platelets stable at 47.  Lab Data  . SARS-CoV-2 negative  Chest x-ray no acute disease EKG on admission sinus rhythm no acute changes  Scheduled Meds: . insulin aspart  0-5 Units Subcutaneous QHS  . insulin aspart  0-9 Units Subcutaneous TID WC  . pantoprazole (PROTONIX) IV  40 mg Intravenous Q24H   Continuous Infusions:  Active Problems:   Cirrhosis ([place])   Hypertension   Diabetes mellitus without complication ([place])   Esophageal varices (HCC)   Dysphagia   GERD (gastroesophageal reflux disease)   LOS: 0 days   How to contact the [facility] Attending or Consulting provider [place] or covering provider during after hours [place], for this patient?  1. Check the care team in [facility] and look for a) attending/consulting [facility] provider listed and b) the [facility] team listed 2. Log into [email or web address] and use [place]'s universal  password to access. If you do not have the password, please contact the hospital operator. 3. Locate the [facility] provider you are looking for under [facility] and page to a number that you can be directly reached. 4. If you still have difficulty reaching the provider, please page the [facility] (Director on Call) for the Hospitalists listed on amion for assistance.

## 2019-07-22 NOTE — H&P (View-Only) (Signed)
Consultation  Referring Provider:     Bernadette Hoit, MD Primary Care Physician:  [name], NP Primary Gastroenterologist:        Dr. Bryan Lemma Reason for Consultation:     Dysphagia, Regurgitation         HPI:   Martin Strong is a 53 y.o. 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 Cirrhosis complicated by esophageal varices with bleeding requiring multiple EGDs with banding in the past along with propranolol for dual prophylaxis. Otherwise no history of ascites, hepatic encephalopathy. Repeat EGD with banding on 02/14/19, with 4 bands placed and had planned on repeat EGD for ongoing banding 8 weeks later, but this has been postponed due to COVID-19 restrictions.  He presents last evening with a 4-day history of not tolerating any p.o. intake.  States that shortly after ingesting liquids or solids, able to transfer bolus from mouth to esophagus without issue, but points to the lower sternal area as a site of perceived obstruction, and has to regurgitate food/liquid immediately back out.  States the bolus is unable to traverse this area.  Also with increased reflux symptoms lately, characterized as regurgitation.  No heartburn per se.  Has had issues with dysphasia in the past, but typically last <24 hours.  Reflux had been previously well controlled with Protonix daily.  Has tried multiple OTC medications for his recent breakthrough reflux, to include Tums, Gaviscon, without improvement.  Was previously evaluated for the same issue earlier this year. Barium esophagram in 01/2019 with possible mild narrowing at the GE J (possibly related to variceal banding), 13 mm barium tablet passed without issue.  EGD in 02/2019 with grade 2 varices with banding x4 with complete eradication, otherwise  normal esophagus without luminal narrowing, stricture, rings, portal hypertensive gastropathy.  In the ER, VSS, afebrile.  Admission labs relatively unchanged from baseline, to include mild anemia (11.6/35), platelets 47 (baseline in the 50s), T bili 1.7.  Cirrhosis Hx: - Etiology: NASH - Diagnosed 2017 - Liver bx: None - Complications: EV with admission for bleeding 09/2018 treated with EGD with EVL - Rx: Propranolol (dual prophy) - HCC Screening: AFP <0.9.    MRI 05/2019 without HCC - MELD: 8 - Child Pugh: A (6 pts)  Endoscopic history: -EGD x3 in 2017 in [place], [place] for esophageal varices with banding -EGD (09/2018, Dr. Bryan Lemma): Grade 2 esophageal varices with stigmata of recent bleeding requiring banding x2 with complete eradication, portal hypertensive gastropathy without bleeding. - EGD (12/2018, Dr. Bryan Lemma): Grade II esophageal varices, 6 bands placed with deflation. Normal Z line at 46 cm, severe portal HTN gastropathy with contact oozing, small GOV1, normal duodenum. H pylori serologies negative -EGD (02/2019, Dr. Bryan Lemma): Grade 2 esophageal varices, 4 bands placed with complete eradication.  Portal hypertensive gastropathy/2 adenopathy  Past Medical History:  Diagnosis Date  . Anemia   . Arthritis   . Cirrhosis ([place])   . Diabetes mellitus without complication ([employer])   . Fatty liver   . GERD (gastroesophageal reflux disease)   . GI bleed   . Hyperlipidemia   . Hypertension     Past Surgical History:  Procedure Laterality Date  . BIOPSY  02/14/2019   Procedure: BIOPSY;  Surgeon: Lavena Bullion, DO;  Location: [facility];  Service: Gastroenterology;;  . ESOPHAGEAL  BANDING  10/07/2018   Procedure: ESOPHAGEAL BANDING;  Surgeon: Lavena Bullion, DO;  Location: [facility];  Service: Gastroenterology;;  . ESOPHAGEAL BANDING N/A 01/03/2019   Procedure: ESOPHAGEAL BANDING;  Surgeon: Lavena Bullion, DO;  Location: [facility];  Service:  Gastroenterology;  Laterality: N/A;  . ESOPHAGEAL BANDING  02/14/2019   Procedure: ESOPHAGEAL BANDING;  Surgeon: Lavena Bullion, DO;  Location: [facility];  Service: Gastroenterology;;  . esophageal bands    . ESOPHAGOGASTRODUODENOSCOPY (EGD) WITH PROPOFOL N/A 10/07/2018   Procedure: ESOPHAGOGASTRODUODENOSCOPY (EGD) WITH PROPOFOL;  Surgeon: Lavena Bullion, DO;  Location: [place];  Service: Gastroenterology;  Laterality: N/A;  . ESOPHAGOGASTRODUODENOSCOPY (EGD) WITH PROPOFOL N/A 01/03/2019   Procedure: ESOPHAGOGASTRODUODENOSCOPY (EGD) WITH PROPOFOL;  Surgeon: Lavena Bullion, DO;  Location: [facility];  Service: Gastroenterology;  Laterality: N/A;  . ESOPHAGOGASTRODUODENOSCOPY (EGD) WITH PROPOFOL N/A 02/14/2019   Procedure: ESOPHAGOGASTRODUODENOSCOPY (EGD) WITH PROPOFOL;  Surgeon: Lavena Bullion, DO;  Location: [facility];  Service: Gastroenterology;  Laterality: N/A;    Family History  Problem Relation Age of Onset  . Diabetes Other   . Esophageal cancer Father   . Colon cancer Neg Hx   . Rectal cancer Neg Hx   . Stomach cancer Neg Hx      Social History   Tobacco Use  . Smoking status: Never Smoker  . Smokeless tobacco: Never Used  Substance Use Topics  . Alcohol use: Never    Frequency: Never  . Drug use: Never    Prior to Admission medications   Medication Sig Start Date End Date Taking? Authorizing Provider  acetaminophen (TYLENOL) 500 MG tablet Take 1,000 mg by mouth every 6 (six) hours as needed for moderate pain or headache.   Yes [provider]  cholecalciferol (VITAMIN D) 1000 units tablet Take 1,000 Units by mouth 2 (two) times daily.   Yes [provider]  dapagliflozin propanediol (FARXIGA) 10 MG TABS tablet Take 10 mg by mouth every evening.    Yes [provider]  ferrous sulfate (KP FERROUS SULFATE) 325 (65 FE) MG tablet Take 1 tablet (325 mg total) by mouth daily with breakfast. 05/30/19  Yes Teagon Kron V, DO   glipiZIDE (GLUCOTROL) 10 MG tablet Take 10 mg by mouth 2 (two) times daily.   Yes [provider]  lisinopril (PRINIVIL,ZESTRIL) 20 MG tablet Take 20 mg by mouth 2 (two) times daily.   Yes [provider]  metFORMIN (GLUCOPHAGE) 1000 MG tablet Take 1,000 mg by mouth 2 (two) times daily with a meal.   Yes [provider]  pantoprazole (PROTONIX) 40 MG tablet Take 1 tablet (40 mg total) by mouth 2 (two) times daily. 03/31/19  Yes Kostantinos Tallman V, DO  propranolol (INDERAL) 40 MG tablet Take 40 mg by mouth 2 (two) times daily.    Yes [provider]    Current Facility-Administered Medications  Medication Dose Route Frequency Provider Last Rate Last Dose  . insulin aspart (novoLOG) injection 0-5 Units  0-5 Units Subcutaneous QHS Adefeso, Oladapo, DO      . insulin aspart (novoLOG) injection 0-9 Units  0-9 Units Subcutaneous TID WC Adefeso, Oladapo, DO      . pantoprazole (PROTONIX) injection 40 mg  40 mg Intravenous Q24H Adefeso, Oladapo, DO   40 mg at 07/22/19 4332    Allergies as of 07/21/2019 - Review Complete 07/21/2019  Allergen Reaction Noted  . Nadolol Itching and Nausea Only 10/06/2018     Review of Systems:  As per HPI, otherwise negative    Physical Exam:  Vital signs in last 24 hours: Temp:  [97.6 F (36.4 C)-98.8 F (37.1 C)] 98.1 F (36.7 C) (08/08 0520) Pulse Rate:  [86-92] 92 (08/08 0520) Resp:  [11-17] 16 (08/08 0520) BP: (113-162)/(61-83) 113/61 (08/08 0520) SpO2:  [97 %-99 %] 97 % (08/08 0520) Weight:  [102.1 kg] 102.1 kg (08/07 1900) Last BM Date: 07/20/19 General:   Pleasant male in NAD Head:  Normocephalic and atraumatic. Eyes:   No icterus.   Conjunctiva pink. Ears:  Normal auditory acuity. Neck:  Supple Lungs:  Respirations even and unlabored. Lungs clear to auscultation bilaterally.   No wheezes, crackles, or rhonchi.  Heart:  Regular rate and rhythm; no MRG Abdomen:  Soft, nondistended, nontender. Normal bowel  sounds. No appreciable masses or hepatomegaly.  Rectal:  Not performed.  Msk:  Symmetrical without gross deformities.  Extremities:  Without edema. Neurologic:  Alert and  oriented x4;  grossly normal neurologically. Skin:  Intact without significant lesions or rashes. Psych:  Alert and cooperative. Normal affect.  LAB RESULTS: Recent Labs    07/21/19 1953 07/22/19 0334  WBC 5.3 4.2  HGB 12.5* 11.6*  HCT 37.7* 35.0*  PLT 59* 47*   BMET Recent Labs    07/21/19 1953 07/22/19 0334  NA 141 140  K 4.0 3.8  CL 105 107  CO2 23 19*  GLUCOSE 87 63*  BUN 17 16  CREATININE 0.86 0.79  CALCIUM 9.4 8.7*   LFT Recent Labs    07/22/19 0334  PROT 6.9  ALBUMIN 3.7  AST 38  ALT 31  ALKPHOS 52  BILITOT 1.7*   PT/INR No results for input(s): LABPROT, INR in the last 72 hours.  STUDIES: Dg Chest 2 View  Result Date: 07/21/2019 CLINICAL DATA:  Intermittent mid to lower chest pain. Epigastric pain. EXAM: CHEST - 2 VIEW COMPARISON:  None. FINDINGS: Scarring or atelectasis in the left base. The heart, hila, mediastinum, lungs, and pleura are otherwise unremarkable. IMPRESSION: No active cardiopulmonary disease. Electronically Signed   By: Dorise Bullion III M.D   On: 07/21/2019 19:36      Impression / Plan:   1)  NASH Cirrhosis 2) History of esophageal varices 3) Dysphagia 4) GERD/Regurgitation  53 year old male with NASH cirrhosis c/b portal hypertension, esophageal varices with prior variceal bleeding, presenting with dysphagia and increasing reflux symptoms.  He has had issues with dysphasia a couple times in the last 6 to 7 months, with an essentially normal barium esophagram and prior EGDs with varices, but no stricture.  Had been unable to proceed with esophageal manometry due to presence of varices.  Now with acute exacerbation of his dysphagia over the last 4 days, not tolerating any p.o. intake.  22 lower sternal border as site of perceived obstruction.  - Plan for EGD  today to evaluate for obstruction, luminal narrowing.  Similarly, if presence of large esophageal varices, will plan on variceal banding - On propranolol for dual prophylaxis - Electrolytes and renal function was well-preserved -Remain n.p.o. for now for EGD later today - Protonix 40 mg IV twice daily for reflux - Add Pepcid 20 mg prior to bedtime  The indications, risks, and benefits of EGD with possible variceal banding were explained to the patient in detail. Risks include but are not limited to bleeding, perforation, adverse reaction to medications, and cardiopulmonary compromise. Sequelae include but are not limited to the possibility of surgery, hositalization, and mortality. The patient verbalized understanding  and wished to proceed. All questions answered. Further recommendations pending results of the exam.    Gerrit Heck, DO, [facility]    LOS: 0 days   Lavena Bullion  07/22/2019, 9:04 AM

## 2019-07-22 NOTE — Interval H&P Note (Signed)
History and Physical Interval Note:  07/22/2019 2:29 PM  Martin Strong  has presented today for surgery, with the diagnosis of Dysphagia.  The various methods of treatment have been discussed with the patient and family. After consideration of risks, benefits and other options for treatment, the patient has consented to  Procedure(s): ESOPHAGOGASTRODUODENOSCOPY (EGD) WITH POSSIBLE BANDING (Left) as a surgical intervention.  The patient's history has been reviewed, patient examined, no change in status, stable for surgery.  I have reviewed the patient's chart and labs.  Questions were answered to the patient's satisfaction.     Dominic Pea Earlena Werst

## 2019-07-22 NOTE — Anesthesia Preprocedure Evaluation (Signed)
Anesthesia Evaluation  Patient identified by MRN, date of birth, ID band Patient awake    Reviewed: Allergy & Precautions, NPO status , Patient's Chart, lab work & pertinent test results  Airway Mallampati: II  TM Distance: >3 FB     Dental   Pulmonary    breath sounds clear to auscultation       Cardiovascular hypertension,  Rhythm:Regular Rate:Normal     Neuro/Psych    GI/Hepatic GERD  ,  Endo/Other  diabetes  Renal/GU      Musculoskeletal   Abdominal   Peds  Hematology   Anesthesia Other Findings   Reproductive/Obstetrics                             Anesthesia Physical Anesthesia Plan  ASA: III  Anesthesia Plan: MAC   Post-op Pain Management:    Induction: Intravenous  PONV Risk Score and Plan: 1 and Midazolam  Airway Management Planned: Nasal Cannula and Simple Face Mask  Additional Equipment:   Intra-op Plan:   Post-operative Plan:   Informed Consent: I have reviewed the patients History and Physical, chart, labs and discussed the procedure including the risks, benefits and alternatives for the proposed anesthesia with the patient or authorized representative who has indicated his/her understanding and acceptance.     Dental advisory given  Plan Discussed with: CRNA and Anesthesiologist  Anesthesia Plan Comments:         Anesthesia Quick Evaluation

## 2019-07-22 NOTE — Progress Notes (Signed)
Pt refusing to use SCDs and Continuous pulse ox. Nurse provided patient with education.

## 2019-07-22 NOTE — ED Notes (Signed)
ED TO INPATIENT HANDOFF REPORT  Name/Age/Gender Martin Strong 53 y.o. male  Code Status    Code Status Orders  (From admission, onward)         Start     Ordered   07/21/19 2211  Full code  Continuous     07/21/19 2211        Code Status History    Date Active Date Inactive Code Status Order ID Comments User Context   10/06/2018 1942 10/10/2018 1527 Full Code [id number]  Vianne Bulls, MD ED   Advance Care Planning Activity      Home/SNF/Other Home  Chief Complaint chest pain; emesis  Level of Care/Admitting Diagnosis ED Disposition    ED Disposition Condition [facility]: [facility] [[id number]]  Level of Care: Med-Surg [16]  Covid Evaluation: Person Under Investigation (PUI)  Diagnosis: Esophageal varices ([place]) [093818]  Admitting Physician: Bernadette Hoit [[id number]]  Attending Physician: Bernadette Hoit [[id number]]  PT Class (Do Not Modify): Observation [104]  PT Acc Code (Do Not Modify): Observation [10022]       Medical History Past Medical History:  Diagnosis Date  . Anemia   . Arthritis   . Cirrhosis ([place])   . Diabetes mellitus without complication ([place])   . Fatty liver   . GERD (gastroesophageal reflux disease)   . GI bleed   . Hyperlipidemia   . Hypertension     Allergies Allergies  Allergen Reactions  . Nadolol Itching and Nausea Only    IV Location/Drains/Wounds Patient Lines/Drains/Airways Status   Active Line/Drains/Airways    Name:   Placement date:   Placement time:   Site:   Days:   Peripheral IV 07/21/19 Right Antecubital   07/21/19    2202    Antecubital   1          Labs/Imaging Results for orders placed or performed during the hospital encounter of 07/21/19 (from the past 48 hour(s))  Basic metabolic panel     Status: None   Collection Time: 07/21/19  7:53 PM  Result Value Ref Range   Sodium 141 135 - 145 mmol/L   Potassium 4.0 3.5 - 5.1 mmol/L   Chloride 105 98 - 111  mmol/L   CO2 23 22 - 32 mmol/L   Glucose, Bld 87 70 - 99 mg/dL   BUN 17 6 - 20 mg/dL   Creatinine, Ser 0.86 0.61 - 1.24 mg/dL   Calcium 9.4 8.9 - 10.3 mg/dL   GFR calc non Af Amer >60 >60 mL/min   GFR calc Af Amer >60 >60 mL/min   Anion gap 13 5 - 15    Comment: Performed at [facility], [place] [address] [place], [place] [address]  CBC     Status: Abnormal   Collection Time: 07/21/19  7:53 PM  Result Value Ref Range   WBC 5.3 4.0 - 10.5 K/uL   RBC 3.77 (L) 4.22 - 5.81 MIL/uL   Hemoglobin 12.5 (L) 13.0 - 17.0 g/dL   HCT 37.7 (L) 39.0 - 52.0 %   MCV 100.0 80.0 - 100.0 fL   MCH 33.2 26.0 - 34.0 pg   MCHC 33.2 30.0 - 36.0 g/dL   RDW 13.7 11.5 - 15.5 %   Platelets 59 (L) 150 - 400 K/uL    Comment: REPEATED TO VERIFY PLATELET COUNT CONFIRMED BY SMEAR SPECIMEN CHECKED FOR CLOTS Immature Platelet Fraction may be clinically indicated, consider ordering this additional test [id number]  nRBC 0.0 0.0 - 0.2 %    Comment: Performed at [facility], [place] [address], [place], [place] [address]  Troponin I (High Sensitivity)     Status: None   Collection Time: 07/21/19  7:53 PM  Result Value Ref Range   Troponin I (High Sensitivity) 3 <18 ng/L    Comment: (NOTE) Elevated high sensitivity troponin I (hsTnI) values and significant  changes across serial measurements may suggest ACS but many other  chronic and acute conditions are known to elevate hsTnI results.  Refer to the "Links" section for chest pain algorithms and additional  guidance. Performed at [facility], [place] [address] [place], [place] [address]   Troponin I (High Sensitivity)     Status: None   Collection Time: 07/21/19 10:04 PM  Result Value Ref Range   Troponin I (High Sensitivity) 3 <18 ng/L    Comment: (NOTE) Elevated high sensitivity troponin I (hsTnI) values and significant  changes across serial measurements may suggest ACS but many other  chronic and  acute conditions are known to elevate hsTnI results.  Refer to the "Links" section for chest pain algorithms and additional  guidance. Performed at [facility], [place] [address] [place], [place] [address]   SARS Coronavirus 2 [facility] order, Performed in [facility] hospital lab) Nasopharyngeal Nasopharyngeal Swab     Status: None   Collection Time: 07/21/19 10:04 PM   Specimen: Nasopharyngeal Swab  Result Value Ref Range   SARS Coronavirus 2 NEGATIVE NEGATIVE    Comment: (NOTE) If result is NEGATIVE SARS-CoV-2 target nucleic acids are NOT DETECTED. The SARS-CoV-2 RNA is generally detectable in upper and lower  respiratory specimens during the acute phase of infection. The lowest  concentration of SARS-CoV-2 viral copies this assay can detect is 250  copies / mL. A negative result does not preclude SARS-CoV-2 infection  and should not be used as the sole basis for treatment or other  patient management decisions.  A negative result may occur with  improper specimen collection / handling, submission of specimen other  than nasopharyngeal swab, presence of viral mutation(s) within the  areas targeted by this assay, and inadequate number of viral copies  (<250 copies / mL). A negative result must be combined with clinical  observations, patient history, and epidemiological information. If result is POSITIVE SARS-CoV-2 target nucleic acids are DETECTED. The SARS-CoV-2 RNA is generally detectable in upper and lower  respiratory specimens dur ing the acute phase of infection.  Positive  results are indicative of active infection with SARS-CoV-2.  Clinical  correlation with patient history and other diagnostic information is  necessary to determine patient infection status.  Positive results do  not rule out bacterial infection or co-infection with other viruses. If result is PRESUMPTIVE POSTIVE SARS-CoV-2 nucleic acids MAY BE PRESENT.   A presumptive positive result  was obtained on the submitted specimen  and confirmed on repeat testing.  While 2019 novel coronavirus  (SARS-CoV-2) nucleic acids may be present in the submitted sample  additional confirmatory testing may be necessary for epidemiological  and / or clinical management purposes  to differentiate between  SARS-CoV-2 and other Sarbecovirus currently known to infect humans.  If clinically indicated additional testing with an alternate test  methodology [phone]) is advised. The SARS-CoV-2 RNA is generally  detectable in upper and lower respiratory sp ecimens during the acute  phase of infection. The expected result is Negative. Fact Sheet for Patients:  StrictlyIdeas.no Fact Sheet  for Healthcare Providers: BankingDealers.co.za This test is not yet approved or cleared by the [place] and has been authorized for detection and/or diagnosis of SARS-CoV-2 by FDA under an Emergency Use Authorization (EUA).  This EUA will remain in effect (meaning this test can be used) for the duration of the COVID-19 declaration under Section 564(b)(1) of the Act, 21 U.S.C. section 360bbb-3(b)(1), unless the authorization is terminated or revoked sooner. Performed at [facility], [place] [address] [place], [place] [address]   Glucose, capillary     Status: Abnormal   Collection Time: 07/21/19 11:09 PM  Result Value Ref Range   Glucose-Capillary 62 (L) 70 - 99 mg/dL   Dg Chest 2 View  Result Date: 07/21/2019 CLINICAL DATA:  Intermittent mid to lower chest pain. Epigastric pain. EXAM: CHEST - 2 VIEW COMPARISON:  None. FINDINGS: Scarring or atelectasis in the left base. The heart, hila, mediastinum, lungs, and pleura are otherwise unremarkable. IMPRESSION: No active cardiopulmonary disease. Electronically Signed   By: Dorise Bullion III M.D   On: 07/21/2019 19:36    Pending Labs Unresulted Labs (From admission, onward)    Start      Ordered   07/22/19 0500  Comprehensive metabolic panel  Tomorrow morning,   R     07/21/19 2211   07/22/19 0500  CBC  Tomorrow morning,   R     07/21/19 2211   07/21/19 2243  Hemoglobin A1c  Once,   STAT    Comments: To assess prior glycemic control    07/21/19 2243          Vitals/Pain Today's Vitals   07/21/19 1910 07/21/19 2038 07/21/19 2200 07/22/19 0030  BP:  (!) 149/80 (!) 144/70 139/75  Pulse:  90 92 86  Resp:  17 11 16   Temp:      TempSrc:      SpO2:  97% 98% 99%  Weight:      Height:      PainSc: 5        Isolation Precautions No active isolations  Medications Medications  insulin aspart (novoLOG) injection 0-9 Units (has no administration in time range)  insulin aspart (novoLOG) injection 0-5 Units (0 Units Subcutaneous Not Given 07/22/19 0042)  sodium chloride flush (NS) 0.9 % injection 3 mL (3 mLs Intravenous Given 07/21/19 2210)  sodium chloride 0.9 % bolus 1,000 mL (0 mLs Intravenous Stopped 07/21/19 2311)    Mobility walks

## 2019-07-22 NOTE — Op Note (Signed)
[facility] Patient Name: Martin Strong Procedure Date: 07/22/2019 MRN: [id number] Attending MD: Gerrit Heck , MD Date of Birth: 07/01/1966 CSN: [id number] Age: 53 Admit Type: Inpatient Procedure:                Upper GI endoscopy Indications:              Dysphagia, Follow-up of esophageal varices                           53 yo male with hx of cirrhosis (MELD 8, Child Pugh                            A) complicated by esophageal varices with prior                            variceal bleed. Last EGD with banding was completed                            in 02/2019 with placement of 4 bands with complete                            eradication. He has since developed dysphagia,                            which was initially intermittent anf infrequent,                            but now with inability to tolerate PO intake for                            the last 4-5 days. Providers:                Gerrit Heck, MD, Baird Cancer, RN, Ashley Jacobs, RN, Cherylynn Ridges, Technician Referring MD:              Medicines:                Monitored Anesthesia Care Complications:            No immediate complications. Estimated Blood Loss:     Estimated blood loss was minimal. Procedure:                Pre-Anesthesia Assessment:                           - Prior to the procedure, a History and Physical                            was performed, and patient medications and                            allergies were reviewed. The patient's tolerance of  previous anesthesia was also reviewed. The risks                            and benefits of the procedure and the sedation                            options and risks were discussed with the patient.                            All questions were answered, and informed consent                            was obtained. Prior Anticoagulants: The patient has     taken no previous anticoagulant or antiplatelet                            agents. ASA Grade Assessment: III - A patient with                            severe systemic disease. After reviewing the risks                            and benefits, the patient was deemed in                            satisfactory condition to undergo the procedure.                           After obtaining informed consent, the endoscope was                            passed under direct vision. Throughout the                            procedure, the patient's blood pressure, pulse, and                            oxygen saturations were monitored continuously. The                            [id number]-H190 ([id number]) Olympus gastroscope was                            introduced through the mouth, and advanced to the                            second part of duodenum. The upper GI endoscopy was                            accomplished without difficulty. The patient                            tolerated the procedure well. Scope In: Scope Out: Findings:  Grade I varices were found in the lower third of the esophagus.      Mulitple post variceal banding scars were found in the lower third of       the esophagus. There were a few ulcers noted at these prior banding       sites. There was one area of luminal deformity noted. No bleeding and no       high grade stigmata of bleeding. The lumen was mildly narrowed but       easily traversed. Given the mucosal appearance, no endoscopic dilation       was performed.      Esophagitis with no bleeding was found 44 cm from the incisors at the [employer].      A medium amount of food (residue) was found in the gastric fundus and in       the gastric body.      Moderate portal hypertensive gastropathy was found in the gastric       fundus, in the gastric body and in the gastric antrum. Biopsies were       taken with a cold forceps for histology. Estimated blood loss  was       minimal.      Nodular mucosa was found in the gastric antrum. Biopsies were taken with       a cold forceps for histology. Estimated blood loss was minimal.      Nodular mucosa was found in the duodenal bulb and in the first portion       of the duodenum. Biopsies were taken with a cold forceps for histology.       Estimated blood loss was minimal.      Normal mucosa was found in the second portion of the duodenum. The       ampulla was normal appearing, but papilla somewhat prominent. Given the       appearance of the esophagus, decision was made to not pass a larger       diameter side-viewing duodenoscope. Impression:               - Grade I esophageal varices.                           - Scar in the lower third of the esophagus.                           - Esophagitis.                           - A medium amount of food (residue) in the stomach.                           - Portal hypertensive gastropathy. Biopsied.                           - Nodular mucosa in the gastric antrum. Biopsied.                           - Nodular mucosa in the duodenal bulb. Biopsied.                           -  Normal mucosa was found in the second portion of                            the duodenum. Moderate Sedation:      Not Applicable - Patient had care per Anesthesia. Recommendation:           - Return patient to hospital ward for ongoing care.                           - Full liquid diet today as tolerated.                           - Continue present medications.                           - Use Protonix (pantoprazole) 40 mg IV BID.                           - Use sucralfate suspension 1 gram PO BID.                           - Await pathology results. Procedure Code(s):        --- Professional ---                           [phone], Esophagogastroduodenoscopy, flexible,                            transoral; with biopsy, single or multiple Diagnosis Code(s):        --- Professional ---                            I85.00, Esophageal varices without bleeding                           K22.8, Other specified diseases of esophagus                           K20.9, Esophagitis, unspecified                           K76.6, Portal hypertension                           K31.89, Other diseases of stomach and duodenum                           R13.10, Dysphagia, unspecified CPT copyright 2019 American Medical Association. All rights reserved. The codes documented in this report are preliminary and upon coder review may  be revised to meet current compliance requirements. Gerrit Heck, MD 07/22/2019 3:28:37 PM Number of Addenda: 0

## 2019-07-22 NOTE — Progress Notes (Signed)
Hypoglycemic Event  CBG: 43  Treatment: D50 50 mL (25 gm)  Symptoms: None  Follow-up CBG: Time: 0615 CBG Result: 118  Possible Reasons for Event: NPO  Comments/MD notified: resolved    Darnelle Catalan

## 2019-07-22 NOTE — Anesthesia Postprocedure Evaluation (Signed)
Anesthesia Post Note  Patient: Martin Strong  Procedure(s) Performed: ESOPHAGOGASTRODUODENOSCOPY (EGD) WITH POSSIBLE BANDING (Left ) BIOPSY     Patient location during evaluation: PACU Anesthesia Type: MAC Level of consciousness: awake Pain management: pain level controlled Vital Signs Assessment: post-procedure vital signs reviewed and stable Respiratory status: spontaneous breathing Cardiovascular status: stable Postop Assessment: no apparent nausea or vomiting Anesthetic complications: no    Last Vitals:  Vitals:   07/22/19 1520 07/22/19 1530  BP: 128/78   Pulse: 98   Resp: 15   Temp:    SpO2: 93% 93%    Last Pain:  Vitals:   07/22/19 1530  TempSrc:   PainSc: 0-No pain                 Matasha Smigelski

## 2019-07-22 NOTE — Transfer of Care (Signed)
Immediate Anesthesia Transfer of Care Note  Patient: Martin Strong  Procedure(s) Performed: ESOPHAGOGASTRODUODENOSCOPY (EGD) WITH POSSIBLE BANDING (Left ) BIOPSY  Patient Location: Endoscopy Unit  Anesthesia Type:MAC  Level of Consciousness: drowsy  Airway & Oxygen Therapy: Patient Spontanous Breathing and Patient connected to nasal cannula oxygen  Post-op Assessment: Report given to RN and Post -op Vital signs reviewed and stable  Post vital signs: Reviewed and stable  Last Vitals:  Vitals Value Taken Time  BP    Temp    Pulse    Resp    SpO2      Last Pain:  Vitals:   07/22/19 1410  TempSrc: Temporal  PainSc: 0-No pain         Complications: No apparent anesthesia complications

## 2019-07-22 NOTE — Consult Note (Signed)
Consultation  Referring Provider:     Bernadette Hoit, MD Primary Care Physician:  [name], NP Primary Gastroenterologist:        Dr. Bryan Lemma Reason for Consultation:     Dysphagia, Regurgitation         HPI:   Martin Strong is a 53 y.o. 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 Cirrhosis complicated by esophageal varices with bleeding requiring multiple EGDs with banding in the past along with propranolol for dual prophylaxis. Otherwise no history of ascites, hepatic encephalopathy. Repeat EGD with banding on 02/14/19, with 4 bands placed and had planned on repeat EGD for ongoing banding 8 weeks later, but this has been postponed due to COVID-19 restrictions.  He presents last evening with a 4-day history of not tolerating any p.o. intake.  States that shortly after ingesting liquids or solids, able to transfer bolus from mouth to esophagus without issue, but points to the lower sternal area as a site of perceived obstruction, and has to regurgitate food/liquid immediately back out.  States the bolus is unable to traverse this area.  Also with increased reflux symptoms lately, characterized as regurgitation.  No heartburn per se.  Has had issues with dysphasia in the past, but typically last <24 hours.  Reflux had been previously well controlled with Protonix daily.  Has tried multiple OTC medications for his recent breakthrough reflux, to include Tums, Gaviscon, without improvement.  Was previously evaluated for the same issue earlier this year. Barium esophagram in 01/2019 with possible mild narrowing at the GE J (possibly related to variceal banding), 13 mm barium tablet passed without issue.  EGD in 02/2019 with grade 2 varices with banding x4 with complete eradication, otherwise  normal esophagus without luminal narrowing, stricture, rings, portal hypertensive gastropathy.  In the ER, VSS, afebrile.  Admission labs relatively unchanged from baseline, to include mild anemia (11.6/35), platelets 47 (baseline in the 50s), T bili 1.7.  Cirrhosis Hx: - Etiology: NASH - Diagnosed 2017 - Liver bx: None - Complications: EV with admission for bleeding 09/2018 treated with EGD with EVL - Rx: Propranolol (dual prophy) - HCC Screening: AFP <0.9.    MRI 05/2019 without HCC - MELD: 8 - Child Pugh: A (6 pts)  Endoscopic history: -EGD x3 in 2017 in [place], [place] for esophageal varices with banding -EGD (09/2018, Dr. Bryan Lemma): Grade 2 esophageal varices with stigmata of recent bleeding requiring banding x2 with complete eradication, portal hypertensive gastropathy without bleeding. - EGD (12/2018, Dr. Bryan Lemma): Grade II esophageal varices, 6 bands placed with deflation. Normal Z line at 46 cm, severe portal HTN gastropathy with contact oozing, small GOV1, normal duodenum. H pylori serologies negative -EGD (02/2019, Dr. Bryan Lemma): Grade 2 esophageal varices, 4 bands placed with complete eradication.  Portal hypertensive gastropathy/2 adenopathy  Past Medical History:  Diagnosis Date  . Anemia   . Arthritis   . Cirrhosis ([place])   . Diabetes mellitus without complication ([place])   . Fatty liver   . GERD (gastroesophageal reflux disease)   . GI bleed   . Hyperlipidemia   . Hypertension     Past Surgical History:  Procedure Laterality Date  . BIOPSY  02/14/2019   Procedure: BIOPSY;  Surgeon: Lavena Bullion, DO;  Location: [facility] ENDOSCOPY;  Service: Gastroenterology;;  . ESOPHAGEAL  BANDING  10/07/2018   Procedure: ESOPHAGEAL BANDING;  Surgeon: Lavena Bullion, DO;  Location: [facility];  Service: Gastroenterology;;  . ESOPHAGEAL BANDING N/A 01/03/2019   Procedure: ESOPHAGEAL BANDING;  Surgeon: Lavena Bullion, DO;  Location: [facility];  Service:  Gastroenterology;  Laterality: N/A;  . ESOPHAGEAL BANDING  02/14/2019   Procedure: ESOPHAGEAL BANDING;  Surgeon: Lavena Bullion, DO;  Location: [facility];  Service: Gastroenterology;;  . esophageal bands    . ESOPHAGOGASTRODUODENOSCOPY (EGD) WITH PROPOFOL N/A 10/07/2018   Procedure: ESOPHAGOGASTRODUODENOSCOPY (EGD) WITH PROPOFOL;  Surgeon: Lavena Bullion, DO;  Location: [place];  Service: Gastroenterology;  Laterality: N/A;  . ESOPHAGOGASTRODUODENOSCOPY (EGD) WITH PROPOFOL N/A 01/03/2019   Procedure: ESOPHAGOGASTRODUODENOSCOPY (EGD) WITH PROPOFOL;  Surgeon: Lavena Bullion, DO;  Location: [facility];  Service: Gastroenterology;  Laterality: N/A;  . ESOPHAGOGASTRODUODENOSCOPY (EGD) WITH PROPOFOL N/A 02/14/2019   Procedure: ESOPHAGOGASTRODUODENOSCOPY (EGD) WITH PROPOFOL;  Surgeon: Lavena Bullion, DO;  Location: [facility];  Service: Gastroenterology;  Laterality: N/A;    Family History  Problem Relation Age of Onset  . Diabetes Other   . Esophageal cancer Father   . Colon cancer Neg Hx   . Rectal cancer Neg Hx   . Stomach cancer Neg Hx      Social History   Tobacco Use  . Smoking status: Never Smoker  . Smokeless tobacco: Never Used  Substance Use Topics  . Alcohol use: Never    Frequency: Never  . Drug use: Never    Prior to Admission medications   Medication Sig Start Date End Date Taking? Authorizing Provider  acetaminophen (TYLENOL) 500 MG tablet Take 1,000 mg by mouth every 6 (six) hours as needed for moderate pain or headache.   Yes [provider]  cholecalciferol (VITAMIN D) 1000 units tablet Take 1,000 Units by mouth 2 (two) times daily.   Yes [provider]  dapagliflozin propanediol (FARXIGA) 10 MG TABS tablet Take 10 mg by mouth every evening.    Yes [provider]  ferrous sulfate (KP FERROUS SULFATE) 325 (65 FE) MG tablet Take 1 tablet (325 mg total) by mouth daily with breakfast. 05/30/19  Yes Eleen Litz V, DO   glipiZIDE (GLUCOTROL) 10 MG tablet Take 10 mg by mouth 2 (two) times daily.   Yes [provider]  lisinopril (PRINIVIL,ZESTRIL) 20 MG tablet Take 20 mg by mouth 2 (two) times daily.   Yes [provider]  metFORMIN (GLUCOPHAGE) 1000 MG tablet Take 1,000 mg by mouth 2 (two) times daily with a meal.   Yes [provider]  pantoprazole (PROTONIX) 40 MG tablet Take 1 tablet (40 mg total) by mouth 2 (two) times daily. 03/31/19  Yes Kc Sedlak V, DO  propranolol (INDERAL) 40 MG tablet Take 40 mg by mouth 2 (two) times daily.    Yes [provider]    Current Facility-Administered Medications  Medication Dose Route Frequency Provider Last Rate Last Dose  . insulin aspart (novoLOG) injection 0-5 Units  0-5 Units Subcutaneous QHS Adefeso, Oladapo, DO      . insulin aspart (novoLOG) injection 0-9 Units  0-9 Units Subcutaneous TID WC Adefeso, Oladapo, DO      . pantoprazole (PROTONIX) injection 40 mg  40 mg Intravenous Q24H Adefeso, Oladapo, DO   40 mg at 07/22/19 6606    Allergies as of 07/21/2019 - Review Complete 07/21/2019  Allergen Reaction Noted  . Nadolol Itching and Nausea Only 10/06/2018     Review of Systems:  As per HPI, otherwise negative    Physical Exam:  Vital signs in last 24 hours: Temp:  [97.6 F (36.4 C)-98.8 F (37.1 C)] 98.1 F (36.7 C) (08/08 0520) Pulse Rate:  [86-92] 92 (08/08 0520) Resp:  [11-17] 16 (08/08 0520) BP: (113-162)/(61-83) 113/61 (08/08 0520) SpO2:  [97 %-99 %] 97 % (08/08 0520) Weight:  [102.1 kg] 102.1 kg (08/07 1900) Last BM Date: 07/20/19 General:   Pleasant male in NAD Head:  Normocephalic and atraumatic. Eyes:   No icterus.   Conjunctiva pink. Ears:  Normal auditory acuity. Neck:  Supple Lungs:  Respirations even and unlabored. Lungs clear to auscultation bilaterally.   No wheezes, crackles, or rhonchi.  Heart:  Regular rate and rhythm; no MRG Abdomen:  Soft, nondistended, nontender. Normal bowel  sounds. No appreciable masses or hepatomegaly.  Rectal:  Not performed.  Msk:  Symmetrical without gross deformities.  Extremities:  Without edema. Neurologic:  Alert and  oriented x4;  grossly normal neurologically. Skin:  Intact without significant lesions or rashes. Psych:  Alert and cooperative. Normal affect.  LAB RESULTS: Recent Labs    07/21/19 1953 07/22/19 0334  WBC 5.3 4.2  HGB 12.5* 11.6*  HCT 37.7* 35.0*  PLT 59* 47*   BMET Recent Labs    07/21/19 1953 07/22/19 0334  NA 141 140  K 4.0 3.8  CL 105 107  CO2 23 19*  GLUCOSE 87 63*  BUN 17 16  CREATININE 0.86 0.79  CALCIUM 9.4 8.7*   LFT Recent Labs    07/22/19 0334  PROT 6.9  ALBUMIN 3.7  AST 38  ALT 31  ALKPHOS 52  BILITOT 1.7*   PT/INR No results for input(s): LABPROT, INR in the last 72 hours.  STUDIES: Dg Chest 2 View  Result Date: 07/21/2019 CLINICAL DATA:  Intermittent mid to lower chest pain. Epigastric pain. EXAM: CHEST - 2 VIEW COMPARISON:  None. FINDINGS: Scarring or atelectasis in the left base. The heart, hila, mediastinum, lungs, and pleura are otherwise unremarkable. IMPRESSION: No active cardiopulmonary disease. Electronically Signed   By: Dorise Bullion III M.D   On: 07/21/2019 19:36      Impression / Plan:   1)  NASH Cirrhosis 2) History of esophageal varices 3) Dysphagia 4) GERD/Regurgitation  52 year old male with NASH cirrhosis c/b portal hypertension, esophageal varices with prior variceal bleeding, presenting with dysphagia and increasing reflux symptoms.  He has had issues with dysphasia a couple times in the last 6 to 7 months, with an essentially normal barium esophagram and prior EGDs with varices, but no stricture.  Had been unable to proceed with esophageal manometry due to presence of varices.  Now with acute exacerbation of his dysphagia over the last 4 days, not tolerating any p.o. intake.  22 lower sternal border as site of perceived obstruction.  - Plan for EGD  today to evaluate for obstruction, luminal narrowing.  Similarly, if presence of large esophageal varices, will plan on variceal banding - On propranolol for dual prophylaxis - Electrolytes and renal function was well-preserved -Remain n.p.o. for now for EGD later today - Protonix 40 mg IV twice daily for reflux - Add Pepcid 20 mg prior to bedtime  The indications, risks, and benefits of EGD with possible variceal banding were explained to the patient in detail. Risks include but are not limited to bleeding, perforation, adverse reaction to medications, and cardiopulmonary compromise. Sequelae include but are not limited to the possibility of surgery, hositalization, and mortality. The patient verbalized understanding  and wished to proceed. All questions answered. Further recommendations pending results of the exam.    Gerrit Heck, DO, [facility]    LOS: 0 days   Lavena Bullion  07/22/2019, 9:04 AM

## 2019-07-23 DIAGNOSIS — I8500 Esophageal varices without bleeding: Secondary | ICD-10-CM

## 2019-07-23 DIAGNOSIS — K209 Esophagitis, unspecified: Secondary | ICD-10-CM

## 2019-07-23 DIAGNOSIS — R1310 Dysphagia, unspecified: Secondary | ICD-10-CM | POA: Diagnosis not present

## 2019-07-23 DIAGNOSIS — K7460 Unspecified cirrhosis of liver: Secondary | ICD-10-CM | POA: Diagnosis not present

## 2019-07-23 LAB — GLUCOSE, CAPILLARY
Glucose-Capillary: 104 mg/dL — ABNORMAL HIGH (ref 70–99)
Glucose-Capillary: 108 mg/dL — ABNORMAL HIGH (ref 70–99)
Glucose-Capillary: 171 mg/dL — ABNORMAL HIGH (ref 70–99)
Glucose-Capillary: 97 mg/dL (ref 70–99)

## 2019-07-23 MED ORDER — LOPERAMIDE HCL 2 MG PO CAPS
2.0000 mg | ORAL_CAPSULE | ORAL | Status: DC | PRN
  Administered 2019-07-23: 2 mg via ORAL
  Filled 2019-07-23: qty 1

## 2019-07-23 MED ORDER — SUCRALFATE 1 GM/10ML PO SUSP: 1.0000 g | Freq: Three times a day (TID) | ORAL | 0 refills | Status: DC

## 2019-07-23 MED ORDER — PANTOPRAZOLE SODIUM 40 MG PO TBEC: 40.0000 mg | DELAYED_RELEASE_TABLET | Freq: Two times a day (BID) | ORAL | Status: DC

## 2019-07-23 NOTE — Plan of Care (Signed)
Assessment unchanged. Verbalized understanding of dc instructions via teach back. No scripts at dc. Discharged via wc to front entrance accompanied by NT to meet ride home.

## 2019-07-23 NOTE — Discharge Summary (Signed)
Physician Discharge Summary  Martin Strong FIE:[id number] DOB: November 18, 1966 DOA: 07/21/2019  PCP: [name], NP  Admit date: 07/21/2019 Discharge date: 07/23/2019  Recommendations for Outpatient Follow-up:  1. Follow-up with gastroenterology.  Follow-up Information    [name], NP Follow up.   Why: as needed Contact information: [place] [address] [phone]        Cirigliano, Vito V, DO Follow up.   Specialty: Gastroenterology Why: Office will contact you with appointment. Contact information: [place] [address] [phone]            Discharge Diagnoses: Principal diagnosis is #1 1. Dysphagia, inability to eat, regurgitation of food secondary to esophagitis, scarring. 2. Dehydration 3. NASH cirrhosis with associated thrombocytopenia, esophageal varices, portal hypertensive gastropathy 4. Hypoglycemia, diabetes mellitus type 2  Discharge Condition: improved Disposition: home  Diet recommendation: Soft diet  Filed Weights   07/21/19 1900 07/22/19 1410  Weight: 102.1 kg 102.1 kg    History of present illness:  53 year old man PMH cirrhosis, esophageal varices presented to the emergency department secondary to inability to eat or drink since 8/4.  Whenever attempting to eat or drink patient feels choking sensation and vomits.  Admitted for dysphagia, GI evaluation, possible endoscopy.  Hospital Course:  Patient was seen by gastroenterology and underwent EGD with resolutions of symptoms.  Tolerated liquids without difficulty.  Diet was advanced and tolerated and patient was cleared by gastroenterology for discharge.  Will discharge home on soft diet with outpatient follow-up with gastroenterology.  Dysphagia, inability to eat, regurgitation of food.  EGD showed grade 1 esophageal varices, scar in the lower third of the esophagus, esophagitis, medium amount of food in the stomach.  Nodular mucosa in the  stomach and duodenum, biopsied. --Resolved status post EGD. --Discussed with gastroenterology.  Plan soft diet, twice daily PPI and Carafate.  Gastroenterology will arrange outpatient follow-up.  Dehydration --Clinically resolved.  NASH cirrhosis with associated thrombocytopenia, esophageal varices, portal hypertensive gastropathy --Stable.  Follow-up with gastroenterology as an outpatient.  Hypoglycemia, diabetes mellitus type 2 --Resolved with oral intake.  Consultants   GI  Procedures   EGD Impression: - Grade I esophageal varices. - Scar in the lower third of the esophagus. - Esophagitis. - A medium amount of food (residue) in the stomach. - Portal hypertensive gastropathy. Biopsied. - Nodular mucosa in the gastric antrum. Biopsied. - Nodular mucosa in the duodenal bulb. Biopsied. - Normal mucosa was found in the second portion of  the duodenum.   Today's assessment: S: Feels well, no complaints today.  Tolerating liquids.  No nausea or vomiting.  No difficulty swallowing.  Liquids passing without difficulty. O: Vitals:  Vitals:   07/23/19 0443 07/23/19 0918  BP: (!) 105/54 128/76  Pulse: 72 79  Resp: 20   Temp: 98 F (36.7 C)   SpO2: 97%     Constitutional:  . Appears calm and comfortable Respiratory:  . CTA bilaterally, no w/r/r.  . Respiratory effort normal.  Cardiovascular:  . RRR, no m/r/g . No LE extremity edema   Abdomen:  . Soft, nontender Psychiatric:  . Mental status o Mood, affect appropriate  CBG stable  Discharge Instructions  Discharge Instructions    Activity as tolerated - No restrictions   Complete by: As directed    Discharge instructions   Complete by: As directed    Call your physician or seek immediate  medical attention for vomiting, persistent nausea, abdominal pain  or worsening of condition.  Seek attention if you are unable to eat or drink or have difficulty swallowing.  Continue a soft diet (diabetic diet, low-salt diet) until follow-up with your gastroenterologist.     Allergies as of 07/23/2019      Reactions   Nadolol Itching, Nausea Only      Medication List    STOP taking these medications   acetaminophen 500 MG tablet Commonly known as: TYLENOL     TAKE these medications   cholecalciferol 1000 units tablet Commonly known as: VITAMIN D Take 1,000 Units by mouth 2 (two) times daily.   Farxiga 10 MG Tabs tablet Generic drug: dapagliflozin propanediol Take 10 mg by mouth every evening.   ferrous sulfate 325 (65 FE) MG tablet Commonly known as: KP Ferrous Sulfate Take 1 tablet (325 mg total) by mouth daily with breakfast.   glipiZIDE 10 MG tablet Commonly known as: GLUCOTROL Take 10 mg by mouth 2 (two) times daily.   lisinopril 20 MG tablet Commonly known as: ZESTRIL Take 20 mg by mouth 2 (two) times daily.   metFORMIN 1000 MG tablet Commonly known as: GLUCOPHAGE Take 1,000 mg by mouth 2 (two) times daily with a meal.   pantoprazole 40 MG tablet Commonly known as: PROTONIX Take 1 tablet (40 mg total) by mouth 2 (two) times daily.   propranolol 40 MG tablet Commonly known as: INDERAL Take 40 mg by mouth 2 (two) times daily.   sucralfate 1 GM/10ML suspension Commonly known as: CARAFATE Take 10 mLs (1 g total) by mouth 4 (four) times daily -  with meals and at bedtime.      Allergies  Allergen Reactions  . Nadolol Itching and Nausea Only    The results of significant diagnostics from this hospitalization (including imaging, microbiology, ancillary and laboratory) are listed below for reference.    Significant Diagnostic Studies: Dg Chest 2 View  Result Date: 07/21/2019 CLINICAL DATA:  Intermittent mid to lower chest pain. Epigastric pain. EXAM: CHEST -  2 VIEW COMPARISON:  None. FINDINGS: Scarring or atelectasis in the left base. The heart, hila, mediastinum, lungs, and pleura are otherwise unremarkable. IMPRESSION: No active cardiopulmonary disease. Electronically Signed   By: Dorise Bullion III M.D   On: 07/21/2019 19:36    Microbiology: Recent Results (from the past 240 hour(s))  SARS Coronavirus 2 [facility] order, Performed in [facility] hospital lab) Nasopharyngeal Nasopharyngeal Swab     Status: None   Collection Time: 07/21/19 10:04 PM   Specimen: Nasopharyngeal Swab  Result Value Ref Range Status   SARS Coronavirus 2 NEGATIVE NEGATIVE Final    Comment: (NOTE) If result is NEGATIVE SARS-CoV-2 target nucleic acids are NOT DETECTED. The SARS-CoV-2 RNA is generally detectable in upper and lower  respiratory specimens during the acute phase of infection. The lowest  concentration of SARS-CoV-2 viral copies this assay can detect is 250  copies / mL. A negative result does not preclude SARS-CoV-2 infection  and should not be used as the sole basis for treatment or other  patient management decisions.  A negative result may occur with  improper specimen collection / handling, submission of specimen other  than nasopharyngeal swab, presence of viral mutation(s) within the  areas targeted by this assay, and inadequate number of viral copies  (<250 copies / mL). A negative result must be combined with clinical  observations, patient history, and epidemiological information. If result is POSITIVE SARS-CoV-2 target nucleic acids are DETECTED. The SARS-CoV-2 RNA is generally detectable  in upper and lower  respiratory specimens dur ing the acute phase of infection.  Positive  results are indicative of active infection with SARS-CoV-2.  Clinical  correlation with patient history and other diagnostic information is  necessary to determine patient infection status.  Positive results do  not rule out bacterial infection or co-infection with  other viruses. If result is PRESUMPTIVE POSTIVE SARS-CoV-2 nucleic acids MAY BE PRESENT.   A presumptive positive result was obtained on the submitted specimen  and confirmed on repeat testing.  While 2019 novel coronavirus  (SARS-CoV-2) nucleic acids may be present in the submitted sample  additional confirmatory testing may be necessary for epidemiological  and / or clinical management purposes  to differentiate between  SARS-CoV-2 and other Sarbecovirus currently known to infect humans.  If clinically indicated additional testing with an alternate test  methodology [id number]) is advised. The SARS-CoV-2 RNA is generally  detectable in upper and lower respiratory sp ecimens during the acute  phase of infection. The expected result is Negative. Fact Sheet for Patients:  StrictlyIdeas.no Fact Sheet for Healthcare Providers: BankingDealers.co.za This test is not yet approved or cleared by the [place] FDA and has been authorized for detection and/or diagnosis of SARS-CoV-2 by FDA under an Emergency Use Authorization (EUA).  This EUA will remain in effect (meaning this test can be used) for the duration of the COVID-19 declaration under Section 564(b)(1) of the Act, 21 U.S.C. section 360bbb-3(b)(1), unless the authorization is terminated or revoked sooner. Performed at [facility], [place] [address] [place],  [address]      Labs: Basic Metabolic Panel: Recent Labs  Lab 07/21/19 1953 07/22/19 0334  NA 141 140  K 4.0 3.8  CL 105 107  CO2 23 19*  GLUCOSE 87 63*  BUN 17 16  CREATININE 0.86 0.79  CALCIUM 9.4 8.7*   Liver Function Tests: Recent Labs  Lab 07/22/19 0334  AST 38  ALT 31  ALKPHOS 52  BILITOT 1.7*  PROT 6.9  ALBUMIN 3.7   CBC: Recent Labs  Lab 07/21/19 1953 07/22/19 0334  WBC 5.3 4.2  HGB 12.5* 11.6*  HCT 37.7* 35.0*  MCV 100.0 101.7*  PLT 59* 47*    CBG: Recent Labs   Lab 07/22/19 2006 07/23/19 0010 07/23/19 0441 07/23/19 0740 07/23/19 1132  GLUCAP 132* 108* 97 104* 171*    Active Problems:   Cirrhosis ([place])   Hypertension   Diabetes mellitus without complication (HCC)   Esophageal varices (HCC)   Dysphagia   GERD (gastroesophageal reflux disease)   Time coordinating discharge: 25 minutes  Signed:  Murray Hodgkins, MD  [facility]  07/23/2019, 1:38 PM

## 2019-07-24 ENCOUNTER — Encounter (HOSPITAL_COMMUNITY): Payer: Self-pay | Admitting: Gastroenterology

## 2019-07-27 SURGERY — Surgical Case
Anesthesia: *Unknown

## 2019-08-01 ENCOUNTER — Telehealth: Payer: Self-pay

## 2019-08-01 NOTE — Telephone Encounter (Signed)
Covid-19 screening questions   Do you now or have you had a fever in the last 14 days?  Do you have any respiratory symptoms of shortness of breath or cough now or in the last 14 days?  Do you have any family members or close contacts with diagnosed or suspected Covid-19 in the past 14 days?  Have you been tested for Covid-19 and found to be positive?    Left voicemail for patient to call back to answer questions

## 2019-08-02 ENCOUNTER — Other Ambulatory Visit: Payer: Self-pay

## 2019-08-02 ENCOUNTER — Encounter: Payer: Self-pay | Admitting: Gastroenterology

## 2019-08-02 ENCOUNTER — Ambulatory Visit: Payer: BC Managed Care – PPO | Admitting: Gastroenterology

## 2019-08-02 VITALS — BP 108/62 | HR 80 | Temp 97.9°F | Ht 73.0 in | Wt 240.2 lb

## 2019-08-02 DIAGNOSIS — K7581 Nonalcoholic steatohepatitis (NASH): Secondary | ICD-10-CM

## 2019-08-02 DIAGNOSIS — I8500 Esophageal varices without bleeding: Secondary | ICD-10-CM

## 2019-08-02 DIAGNOSIS — K3189 Other diseases of stomach and duodenum: Secondary | ICD-10-CM

## 2019-08-02 DIAGNOSIS — K2970 Gastritis, unspecified, without bleeding: Secondary | ICD-10-CM | POA: Diagnosis not present

## 2019-08-02 DIAGNOSIS — K2990 Gastroduodenitis, unspecified, without bleeding: Secondary | ICD-10-CM

## 2019-08-02 DIAGNOSIS — K7469 Other cirrhosis of liver: Secondary | ICD-10-CM

## 2019-08-02 DIAGNOSIS — K766 Portal hypertension: Secondary | ICD-10-CM

## 2019-08-02 DIAGNOSIS — R1310 Dysphagia, unspecified: Secondary | ICD-10-CM

## 2019-08-02 NOTE — Telephone Encounter (Signed)
Covid-19 screening questions   Do you now or have you had a fever in the last 14 days? No  Do you have any respiratory symptoms of shortness of breath or cough now or in the last 14 days? No  Do you have any family members or close contacts with diagnosed or suspected Covid-19 in the past 14 days? No  Have you been tested for Covid-19 and found to be positive? Tested negative per patient

## 2019-08-02 NOTE — Patient Instructions (Addendum)
If you are age 52 or older, your body mass index should be between 23-30. Your Body mass index is 31.7 kg/m. If this is out of the aforementioned range listed, please consider follow up with your Primary Care Provider.  If you are age 48 or younger, your body mass index should be between 19-25. Your Body mass index is 31.7 kg/m. If this is out of the aformentioned range listed, please consider follow up with your Primary Care Provider.   To help prevent the possible spread of infection to our patients, communities, and staff; we will be implementing the following measures:  As of now we are not allowing any visitors/family members to accompany you to any upcoming appointments with [facility]. If you have any concerns about this please contact our office to discuss prior to the appointment.   Please call our office at [phone] to set up your 6 month follow up visit.  It was a pleasure to see you today!  Vito Cirigliano, D.O.

## 2019-08-02 NOTE — Progress Notes (Signed)
P  Chief Complaint:    Dysphagia, NASH Cirrhosis, Esophageal varices  GI History: 53 year old male with a history ofNASHcirrhosis diagnosed in February 2017 with history of variceal bleeding, admitted to [facility] in October 2019 with recurrence of variceal bleeding, treated with EGD with EVL x2 bands and cessation of bleeding. Was previously followed by GI in [place], [place]. Cirrhosis complicated by esophageal varices with bleeding requiring multiple EGDs with banding in the past along with propranolol for dual prophylaxis. Otherwise no history of ascites, hepatic encephalopathy.  Cirrhosis Hx: - Etiology: NASH - Diagnosed 2017 - Liver bx: None - Complications: EV with admission for bleeding 09/2018 treated with EGD with EVL - Rx: Propranolol (dual prophy) - HCC Screening: AFP <0.9.  MRI 05/2019 negative for [place].  Equivocal area of washout in segment 7-recommend repeat MRI in 6 months - Annual lab check: Completed 05/2019-mild iron deficiency.  Takes ferrous sulfate - HAV and HBV vaccine series started 05/2019 - MELD: 8 - Child Pugh: A (6 pts)  Endoscopic history: -EGD x3 in 2017 in [place], [place] for esophageal varices with banding -EGD (09/2018, Dr. Bryan Lemma): Grade 2 esophageal varices with stigmata of recent bleeding requiring banding x2 with complete eradication, portal hypertensive gastropathy without bleeding. - EGD (12/2018, Dr. Bryan Lemma): Grade II esophageal varices, 6 bands placed with deflation. Normal Z line at 46 cm, severe portal HTN gastropathy with contact oozing, small GOV1, normal duodenum. H pylori serologies negative -EGD (02/2019, Dr. Bryan Lemma): Grade 2 esophageal varices, 4 bands placed with complete eradication.  Portal hypertensive gastropathy/2 adenopathy -EGD (07/2019, Dr. Bryan Lemma): Done for dysphagia. grade 1 esophageal varices, multiple post variceal banding scars in the lower third with 1 area of luminal deformity, likely consistent with prior  deep banding site.  The lumen was mildly narrowed, but easily traversed and no additional endoscopic dilation performed.  Mild esophagitis at the GEJ, portal hypertensive gastropathy, peptic antritis/duodenitis.  Papilla prominent.  HPI:     Patient is a 53 y.o. male presenting to the Gastroenterology Clinic for hospital follow-up.  Admitted to [facility] 8/7-9 with dysphagia, not tolerating any p.o. intake.  EGD on 8/8 demonstrated grade 1 esophageal varices, multiple post variceal banding scars in the lower third with 1 area of luminal deformity, likely consistent with prior deep banding site.  The lumen was mildly narrowed, but easily traversed and no additional endoscopic dilation performed.  Mild esophagitis at the GE J, portal hypertensive gastropathy, peptic antritis/duodenitis.  Papilla prominent, likely secondary to duodenitis.  Treated with Protonix 40 mg bid and sucralfate, with rapid clinical improvement.  Was tolerating p.o. intake, and discharged the following day.  Prior to admission, endorsed 2 episodes of dysphagia since 01/2019, but acute worsening days leading up to his admission.  Today states he feels well witout any issues swallowing. Tolerating all PO intake and medicaitons without issue.  Offers no complaints.  Review of systems:     No chest pain, no SOB, no fevers, no urinary sx   Past Medical History:  Diagnosis Date  . Anemia   . Arthritis   . Cirrhosis ([place])   . Diabetes mellitus without complication ([place])   . Fatty liver   . GERD (gastroesophageal reflux disease)   . GI bleed   . Hyperlipidemia   . Hypertension     Patient's surgical history, family medical history, social history, medications and allergies were all reviewed in Epic    Current Outpatient Medications  Medication Sig Dispense Refill  . cholecalciferol (  VITAMIN D) 1000 units tablet Take 1,000 Units by mouth 2 (two) times daily.    . dapagliflozin propanediol (FARXIGA) 10 MG TABS tablet  Take 10 mg by mouth every evening.     . ferrous sulfate (KP FERROUS SULFATE) 325 (65 FE) MG tablet Take 1 tablet (325 mg total) by mouth daily with breakfast. 30 tablet 3  . glipiZIDE (GLUCOTROL) 10 MG tablet Take 10 mg by mouth 2 (two) times daily.    Marland Kitchen lisinopril (PRINIVIL,ZESTRIL) 20 MG tablet Take 20 mg by mouth 2 (two) times daily.    . metFORMIN (GLUCOPHAGE) 1000 MG tablet Take 1,000 mg by mouth 2 (two) times daily with a meal.    . pantoprazole (PROTONIX) 40 MG tablet Take 1 tablet (40 mg total) by mouth 2 (two) times daily. 60 tablet 3  . propranolol (INDERAL) 40 MG tablet Take 40 mg by mouth 2 (two) times daily.     . sucralfate (CARAFATE) 1 GM/10ML suspension Take 10 mLs (1 g total) by mouth 4 (four) times daily -  with meals and at bedtime. 1200 mL 0   No current facility-administered medications for this visit.     Physical Exam:     There were no vitals taken for this visit.  GENERAL:  Pleasant male in NAD PSYCH: : Cooperative, normal affect EENT:  conjunctiva pink, mucous membranes moist, neck supple without masses CARDIAC:  RRR, no murmur heard, no peripheral edema PULM: Normal respiratory effort, lungs CTA bilaterally, no wheezing ABDOMEN:  Nondistended, soft, nontender. No obvious masses, no hepatomegaly,  normal bowel sounds SKIN:  turgor, no lesions seen Musculoskeletal:  Normal muscle tone, normal strength NEURO: Alert and oriented x 3, no focal neurologic deficits   IMPRESSION and PLAN:    1) NASH Cirrhosis: History of NASH cirrhosis with decompensation as manifested by bleeding esophageal varices.  Otherwise relatively low MELD (8) and Child Pugh A.  -UTD on vaccine series and labs -Can consider repeat EGD in 1 year for EV surveillance -Repeat MRI in 6 months - Resume <2gm Na diet -No plan for referral to transplant evaluation given lowMELD -Complete viral hepatitis vaccine series -Resume propranolol for dual prophylaxis - Resume PPI for both reflux  and portal hypertensive gastritis -Resume Carafate for now  2) Esophageal varices: -Resume propranolol for dual prophylaxis given history of bleed -Repeat EGD in 1 year for EV surveillance -Otherwise without any evidence of overt GI blood loss recently  3) Mild Iron reduction: Mildly reduced iron indices in 2019. Had been taking oral iron therapy and has continued to take without any medication ADRs, with improvement in H/H at 12/37 (from 7.7/23).  - Resume PO iron - Repeat CBC and iron indicesin 3 to 6 months  4) Dysphagia: Resolved.  There is a 2 to 3% incidence of dysphagia following EVL.  Monitor for symptom recurrence  5) GERD: -Well controlled with PPI therapy -Resume PPI -Resume Carafate for now to promote further mucosal healing -Resume antireflux lifestyle modifications  -RTC in 6 months  I spent a total of 25 minutes of face-to-face time with the patient. Greater than 50% of the time was spent counseling and coordinating care.       [place] ,DO, FACG 08/02/2019, 1:01 PM

## 2019-08-03 ENCOUNTER — Encounter: Payer: Self-pay | Admitting: Gastroenterology

## 2019-08-30 NOTE — Addendum Note (Signed)
Addended by: Herma Mering D on: 08/30/2019 11:03 AM   Modules accepted: Orders

## 2019-08-31 ENCOUNTER — Telehealth: Payer: Self-pay

## 2019-08-31 NOTE — Telephone Encounter (Signed)
LMOM to notify patient of repeat lab work he needs to have drawn and scheduled MRI of the Liver for 11/29/2019 at 10:00am. Arrive by 9:30am. Letter has been sent to patient with instructions and orders.

## 2019-09-05 ENCOUNTER — Telehealth: Payer: Self-pay | Admitting: Gastroenterology

## 2019-09-05 NOTE — Telephone Encounter (Signed)
Pt called stating that he was returning your call from yesterday. Pls call him again.

## 2019-09-05 NOTE — Telephone Encounter (Signed)
LMOM to notify patient of repeat lab work he needs to have drawn and scheduled MRI of the Liver for 11/29/2019 at 10:00am. Arrive by 9:30am. Letter has been sent to patient with instructions and orders.

## 2019-09-19 NOTE — Telephone Encounter (Signed)
I have printed another letter and mailed it to the patient notifying him of lab work due and MRI in 11/2019.

## 2019-09-25 ENCOUNTER — Telehealth: Payer: Self-pay | Admitting: Gastroenterology

## 2019-09-25 NOTE — Telephone Encounter (Signed)
Called and spoke with patient-patient reports he is having abd pain that radiates around to his back- some times hurts worse than others/denies fever/denies urinary symptoms/having an "area on the right side of my belly button-it gets hot sometimes-does not swell up (to me) but my wife says it is swollen -but my whole belly is swollen all the time anyway"-thinks the place on his belly might be a hernia?- Patient has been scheduled for an appt on 10/02/2019 at 1:40 pm;   Please advise if the patient needs to try to be seen earlier by an APP or "over book" your schedule on 09/28/2019 or does the patient need to go to the ER or does he just need to keep his appt on 10/19?

## 2019-09-25 NOTE — Telephone Encounter (Signed)
Patient is aware to call back to the office should questions or concerns arise or if symptoms worsen; patient agrees with appt on 10/02/2019 at 1:40 pm; Patient verbalized understanding of information/instructions;

## 2019-09-25 NOTE — Telephone Encounter (Signed)
Sounds like from description appt on 10/19 is appropriate. If he is more concerned and seeking a sooner appt, than agree with scheduling appt with an APP. Thanks.

## 2019-10-02 ENCOUNTER — Other Ambulatory Visit (INDEPENDENT_AMBULATORY_CARE_PROVIDER_SITE_OTHER): Payer: BC Managed Care – PPO

## 2019-10-02 ENCOUNTER — Ambulatory Visit (INDEPENDENT_AMBULATORY_CARE_PROVIDER_SITE_OTHER): Payer: BC Managed Care – PPO | Admitting: Gastroenterology

## 2019-10-02 ENCOUNTER — Other Ambulatory Visit: Payer: Self-pay

## 2019-10-02 ENCOUNTER — Encounter: Payer: Self-pay | Admitting: Gastroenterology

## 2019-10-02 VITALS — BP 126/74 | HR 87 | Temp 97.6°F | Ht 73.0 in | Wt 262.5 lb

## 2019-10-02 DIAGNOSIS — R600 Localized edema: Secondary | ICD-10-CM

## 2019-10-02 DIAGNOSIS — K7581 Nonalcoholic steatohepatitis (NASH): Secondary | ICD-10-CM

## 2019-10-02 DIAGNOSIS — I8500 Esophageal varices without bleeding: Secondary | ICD-10-CM

## 2019-10-02 DIAGNOSIS — R635 Abnormal weight gain: Secondary | ICD-10-CM

## 2019-10-02 DIAGNOSIS — R188 Other ascites: Secondary | ICD-10-CM

## 2019-10-02 DIAGNOSIS — K7460 Unspecified cirrhosis of liver: Secondary | ICD-10-CM | POA: Diagnosis not present

## 2019-10-02 DIAGNOSIS — K766 Portal hypertension: Secondary | ICD-10-CM

## 2019-10-02 DIAGNOSIS — D509 Iron deficiency anemia, unspecified: Secondary | ICD-10-CM

## 2019-10-02 DIAGNOSIS — K3189 Other diseases of stomach and duodenum: Secondary | ICD-10-CM

## 2019-10-02 DIAGNOSIS — R1310 Dysphagia, unspecified: Secondary | ICD-10-CM

## 2019-10-02 LAB — COMPREHENSIVE METABOLIC PANEL
ALT: 23 U/L (ref 0–53)
AST: 30 U/L (ref 0–37)
Albumin: 3.8 g/dL (ref 3.5–5.2)
Alkaline Phosphatase: 95 U/L (ref 39–117)
BUN: 16 mg/dL (ref 6–23)
CO2: 25 mEq/L (ref 19–32)
Calcium: 9.2 mg/dL (ref 8.4–10.5)
Chloride: 102 mEq/L (ref 96–112)
Creatinine, Ser: 0.91 mg/dL (ref 0.40–1.50)
GFR: 87.55 mL/min (ref >60.00)
Glucose, Bld: 118 mg/dL — ABNORMAL HIGH (ref 70–99)
Potassium: 4.3 mEq/L (ref 3.5–5.1)
Sodium: 136 mEq/L (ref 135–145)
Total Bilirubin: 1.3 mg/dL — ABNORMAL HIGH (ref 0.2–1.2)
Total Protein: 7.3 g/dL (ref 6.0–8.3)

## 2019-10-02 LAB — CBC WITH DIFFERENTIAL/PLATELET
Basophils Absolute: 0 10*3/uL (ref 0.0–0.1)
Basophils Relative: 0.6 % (ref 0.0–3.0)
Eosinophils Absolute: 0.2 10*3/uL (ref 0.0–0.7)
Eosinophils Relative: 2.3 % (ref 0.0–5.0)
HCT: 35.2 % — ABNORMAL LOW (ref 39.0–52.0)
Hemoglobin: 11.8 g/dL — ABNORMAL LOW (ref 13.0–17.0)
Lymphocytes Relative: 11.1 % — ABNORMAL LOW (ref 12.0–46.0)
Lymphs Abs: 0.8 10*3/uL (ref 0.7–4.0)
MCHC: 33.6 g/dL (ref 30.0–36.0)
MCV: 95.3 fl (ref 78.0–100.0)
Monocytes Absolute: 0.6 10*3/uL (ref 0.1–1.0)
Monocytes Relative: 9.1 % (ref 3.0–12.0)
Neutro Abs: 5.4 10*3/uL (ref 1.4–7.7)
Neutrophils Relative %: 76.9 % (ref 43.0–77.0)
Platelets: 151 10*3/uL (ref 150.0–400.0)
RBC: 3.69 Mil/uL — ABNORMAL LOW (ref 4.22–5.81)
RDW: 14.8 % (ref 11.5–15.5)
WBC: 7 10*3/uL (ref 4.0–10.5)

## 2019-10-02 LAB — PROTIME-INR
INR: 1.3 ratio — ABNORMAL HIGH (ref 0.8–1.0)
Prothrombin Time: 15.4 s — ABNORMAL HIGH (ref 9.6–13.1)

## 2019-10-02 MED ORDER — SPIRONOLACTONE 25 MG PO TABS: 25.0000 mg | ORAL_TABLET | Freq: Every day | ORAL | 0 refills | Status: DC

## 2019-10-02 MED ORDER — FUROSEMIDE 20 MG PO TABS: 20.0000 mg | ORAL_TABLET | Freq: Every day | ORAL | 0 refills | Status: DC

## 2019-10-02 NOTE — Progress Notes (Signed)
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 [facility] [place]. Cirrhosis complicated by esophageal varices with bleeding requiring multiple EGDs with banding in the past along with propranolol for dual prophylaxis. Otherwise no history of hepatic encephalopathy and only trace ascites on imaging previous to today.  Cirrhosis Hx: - Etiology: NASH - Diagnosed 2017 - Liver VB:TYOM - Complications: EV with admission for bleeding 09/2018 treated with EGD with EVL - Rx: Propranolol (dual prophy) - HCC Screening: AFP <0.9.MRI 05/2019 negative for [place].  Equivocal area of washout in segment 7-recommend repeat MRI in 6 months - Annual lab check: Completed 05/2019-mild iron deficiency.  Takes ferrous sulfate - HAV and HBV vaccine series started 05/2019 - MELD: 8 - Child Pugh: A (6 pts)  Endoscopic history: -EGD x3 in 2017 in [place], [place] for esophageal varices with banding -EGD (09/2018, Dr. Bryan Lemma): Grade 2 esophageal varices with stigmata of recent bleeding requiring banding x2 with complete eradication, portal hypertensive gastropathy without bleeding. - EGD (12/2018, Dr. Bryan Lemma): Grade II esophageal varices, 6 bands placed with deflation. Normal Z line at 46 cm, severe portal HTN gastropathy with contact oozing, small GOV1, normal duodenum. H pylori serologies negative -EGD (02/2019, Dr. Bryan Lemma): Grade 2 esophageal varices, 4 bands placed with complete eradication.Portal hypertensive gastropathy/2 adenopathy -EGD (07/2019, Dr. Bryan Lemma): Done for dysphagia. grade 1 esophageal varices, multiple post variceal banding scars in the lower third with 1 area of luminal  deformity, likely consistent with prior deep banding site.  The lumen was mildly narrowed, but easily traversed and no additional endoscopic dilation performed.  Mild esophagitis at the GEJ, portal hypertensive gastropathy, peptic antritis/duodenitis.  Papilla prominent.  HPI:     Patient is a 53 y.o. male presenting to the Gastroenterology Clinic for evaluation of abdominal pain and weight gain.  Last seen by me in 07/2019.  Today, he states he has generalized abdominal pain and distension. Sxs have been building for 3-4 weeks or so. No f/c/n/v/d/c. No hematochezia, melena.  Some early satiety over last couple of weeks.    Reports he was 237# at [facility] appt 3-4 weeks ago, and is 262#in clinic today.    Review of systems:     No chest pain, no SOB, no fevers, no urinary sx   Past Medical History:  Diagnosis Date  . Anemia   . Arthritis   . Cirrhosis ([place])   . Diabetes mellitus without complication ([place])   . Fatty liver   . GERD (gastroesophageal reflux disease)   . GI bleed   . Hyperlipidemia   . Hypertension     Patient's surgical history, family medical history, social history, medications and allergies were all reviewed in Epic    Current Outpatient Medications  Medication Sig Dispense Refill  . cholecalciferol (VITAMIN D) 1000 units tablet Take 1,000 Units by mouth 2 (two) times daily.    . dapagliflozin propanediol (FARXIGA) 10 MG TABS tablet Take 10 mg by mouth every evening.     . ferrous sulfate (KP FERROUS SULFATE) 325 (65 FE) MG tablet Take 1 tablet (325 mg total) by mouth daily with breakfast. 30 tablet 3  . glipiZIDE (GLUCOTROL) 10 MG tablet Take 10 mg by mouth 2 (two) times daily.    Marland Kitchen  lisinopril (PRINIVIL,ZESTRIL) 20 MG tablet Take 20 mg by mouth 2 (two) times daily.    . metFORMIN (GLUCOPHAGE) 1000 MG tablet Take 1,000 mg by mouth 2 (two) times daily with a meal.    . pantoprazole (PROTONIX) 40 MG tablet Take 1 tablet (40 mg total) by mouth 2 (two) times daily. 60  tablet 3  . propranolol (INDERAL) 40 MG tablet Take 40 mg by mouth 2 (two) times daily.     . sucralfate (CARAFATE) 1 GM/10ML suspension Take 10 mLs (1 g total) by mouth 4 (four) times daily -  with meals and at bedtime. 1200 mL 0   No current facility-administered medications for this visit.     Physical Exam:     BP 126/74   Pulse 87   Temp 97.6 F (36.4 C)   Ht 6' 1"  (1.854 m)   Wt 262 lb 8 oz (119.1 kg)   BMI 34.63 kg/m   GENERAL:  Pleasant male in NAD PSYCH: : Cooperative, normal affect EENT:  Conjunctiva pink, mucous membranes moist, neck supple without masses CARDIAC:  RRR, no murmur heard, no peripheral edema PULM: Normal respiratory effort, lungs CTA bilaterally, no wheezing ABDOMEN: Distended with  +fluid wave.  No TTP  SKIN:  turgor, no lesions seen Musculoskeletal: Temporal wasting.  Normal strength Extremities: b/l 1+ LE edema to mid shins NEURO: Alert and oriented x 3, no focal neurologic deficits   IMPRESSION and PLAN:    1)NASH Cirrhosis: 2) Ascites 3) Lower extremity edema  History ofNASH cirrhosis with decompensation as manifested by bleeding esophageal varices, and now with apparent large volume ascites.   -Referral to IR for ultrasound and paracentesis for diagnostic/therapeutic intent -Give 25% albumin with paracentesis -Send ascites fluid for protein, albumin, Gram stain, culture, cytology, cell count -Check CBC, CMP, INR today both for preoperative assessment along with reassessment MELD - Start Lasix 20 mg/day and Aldactone 25 mg/day -Repeat BMP in 1 week.  Can uptitrate Aldactone accordingly to 20/50 ratio -Referral to Roosevelt Locks at [facility] -UTD on vaccine series and labs -Can consider repeat EGD in 07/2020 for EV surveillance -Repeat MRI scheduled for 11/29/2019 for ongoing [place] screening - Resume <2gm Na diet -Complete viral hepatitis vaccine series  4) Esophageal varices: 5) Portal hypertensive gastropathy -Resume  propranolol for dual prophylaxis given history of bleed -Resume PPI for both reflux and portal hypertensive gastritis -Repeat EGD in 1 year for EV surveillance  6)Mild Iron reduction: Mildly reduced iron indicesin 2019. Had been taking oral iron therapy and has continued to take without any medication ADRs,with improvement inH/H at12/37 (from 7.7/23).  - Resume PO iron - Repeat CBC today -Repeat iron indicesin 3 to 6 months  7) Dysphagia: Resolved.  There is a 2 to 3% incidence of dysphagia following EVL.  Monitor for symptom recurrence  8) GERD: -Well controlled with PPI therapy -Resume PPI -Resume antireflux lifestyle modifications  9) Weight gain: -Secondary to apparent large volume ascites.  Plan for ultrasound with large volume paracentesis  I spent well over 25 minutes of face-to-face time with the patient. Greater than 50% of the time was spent counseling and coordinating care.           [place] ,DO, FACG 10/02/2019, 1:55 PM

## 2019-10-02 NOTE — Patient Instructions (Signed)
If you are age 52 or older, your body mass index should be between 23-30. Your Body mass index is 34.63 kg/m. If this is out of the aforementioned range listed, please consider follow up with your Primary Care Provider.  If you are age 32 or younger, your body mass index should be between 19-25. Your Body mass index is 34.63 kg/m. If this is out of the aformentioned range listed, please consider follow up with your Primary Care Provider.   You have been referred to Interventional Radiology you will be getting a call with information regarding your appointment.  You have been referred to [facility] you will receive a call regarding this appointment.   We have sent the following medications to your pharmacy for you to pick up at your convenience: Lasix Aldactone  Please go to the lab at [facility] ([place] You will need to go to level "B", you do not need an appointment for this. Hours available are 7:30 am - 4:30 pm.    It was a pleasure to see you today!  Vito Cirigliano, D.O.

## 2019-10-03 ENCOUNTER — Telehealth: Payer: Self-pay | Admitting: Gastroenterology

## 2019-10-03 ENCOUNTER — Other Ambulatory Visit: Payer: Self-pay

## 2019-10-03 DIAGNOSIS — K7460 Unspecified cirrhosis of liver: Secondary | ICD-10-CM

## 2019-10-03 DIAGNOSIS — R600 Localized edema: Secondary | ICD-10-CM

## 2019-10-03 LAB — IRON,TIBC AND FERRITIN PANEL
%SAT: 33 % (calc) (ref 20–48)
Ferritin: 52 ng/mL (ref 38–380)
Iron: 88 ug/dL (ref 50–180)
TIBC: 269 mcg/dL (calc) (ref 250–425)

## 2019-10-03 NOTE — Telephone Encounter (Signed)
Verbal order received from Dr. Bryan Lemma as follows:  12.5 gram of Albumin 25% to be given IV for every 2 liters of fluid that is removed, total of 5 liters to be removed

## 2019-10-03 NOTE — Addendum Note (Signed)
Addended by: Mohammed Kindle on: 10/03/2019 02:34 PM   Modules accepted: Orders

## 2019-10-03 NOTE — Telephone Encounter (Addendum)
Called and spoke with patient's wife-Sandra-verified DPR-Sandra was informed that the patient has been scheduled for a paracentesis on 10/05/2019 at 9:00 at [facility] is to arrive at 8:30 for check in and has been instructed to hold AM dose of diabetic medications; Katharine Look verified understanding of information via read back method; Linus Orn was advised to call back to the office at [phone] should questions/concerns arise;  Katharine Look verbalized understanding of information/instructions;

## 2019-10-03 NOTE — Addendum Note (Signed)
Addended by: Mohammed Kindle on: 10/03/2019 03:11 PM   Modules accepted: Orders

## 2019-10-03 NOTE — Telephone Encounter (Signed)
A referral cannot be placed for a paracentesis- an order must be placed for this- this order has been pended on the OV notation-the order needs to include the amount of fluid that needs to be removed from the patient and the amount of Albumin (25%) that the patient has been requested to be infused- Please advise

## 2019-10-05 ENCOUNTER — Other Ambulatory Visit: Payer: Self-pay

## 2019-10-05 ENCOUNTER — Ambulatory Visit (HOSPITAL_COMMUNITY)
Admission: RE | Admit: 2019-10-05 | Discharge: 2019-10-05 | Disposition: A | Discharge status: Home / Self Care | Payer: BC Managed Care – PPO | Source: Ambulatory Visit | Attending: Gastroenterology | Admitting: Gastroenterology

## 2019-10-05 ENCOUNTER — Encounter (HOSPITAL_COMMUNITY): Payer: Self-pay | Admitting: Radiology

## 2019-10-05 DIAGNOSIS — K7460 Unspecified cirrhosis of liver: Secondary | ICD-10-CM | POA: Diagnosis not present

## 2019-10-05 DIAGNOSIS — K7581 Nonalcoholic steatohepatitis (NASH): Secondary | ICD-10-CM

## 2019-10-05 DIAGNOSIS — R600 Localized edema: Secondary | ICD-10-CM

## 2019-10-05 DIAGNOSIS — R188 Other ascites: Secondary | ICD-10-CM | POA: Diagnosis not present

## 2019-10-05 HISTORY — PX: IR PARACENTESIS: IMG2679

## 2019-10-05 LAB — BODY FLUID CELL COUNT WITH DIFFERENTIAL
Eos, Fluid: 0 %
Lymphs, Fluid: 64 %
Monocyte-Macrophage-Serous Fluid: 32 % — ABNORMAL LOW (ref 50–90)
Neutrophil Count, Fluid: 4 % (ref 0–25)
Other Cells, Fluid: 1 %
Total Nucleated Cell Count, Fluid: 141 cu mm (ref 0–1000)

## 2019-10-05 LAB — GRAM STAIN

## 2019-10-05 LAB — PROTEIN, PLEURAL OR PERITONEAL FLUID: Total protein, fluid: <3 g/dL

## 2019-10-05 LAB — ALBUMIN, PLEURAL OR PERITONEAL FLUID: Albumin, Fluid: <1 g/dL

## 2019-10-05 IMAGING — US IR PARACENTESIS
1 series · 2 of 2 positions shown · non-contrast
Comparison: none

INDICATION: Newly diagnosed cirrhosis. Abdominal distention secondary to
ascites. Request for diagnostic and therapeutic paracentesis up to 5
L max.

[Series 1: ir (id) (id)/(id)/(id) ir · 2 of 2 slices shown]
[im 1/2]
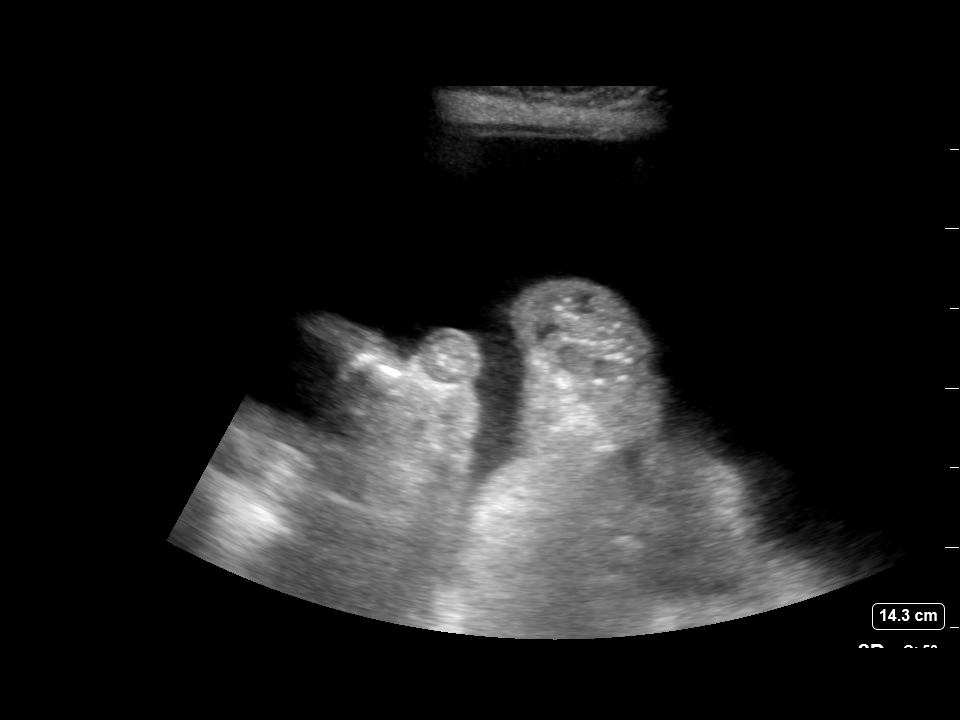
[im 2/2]
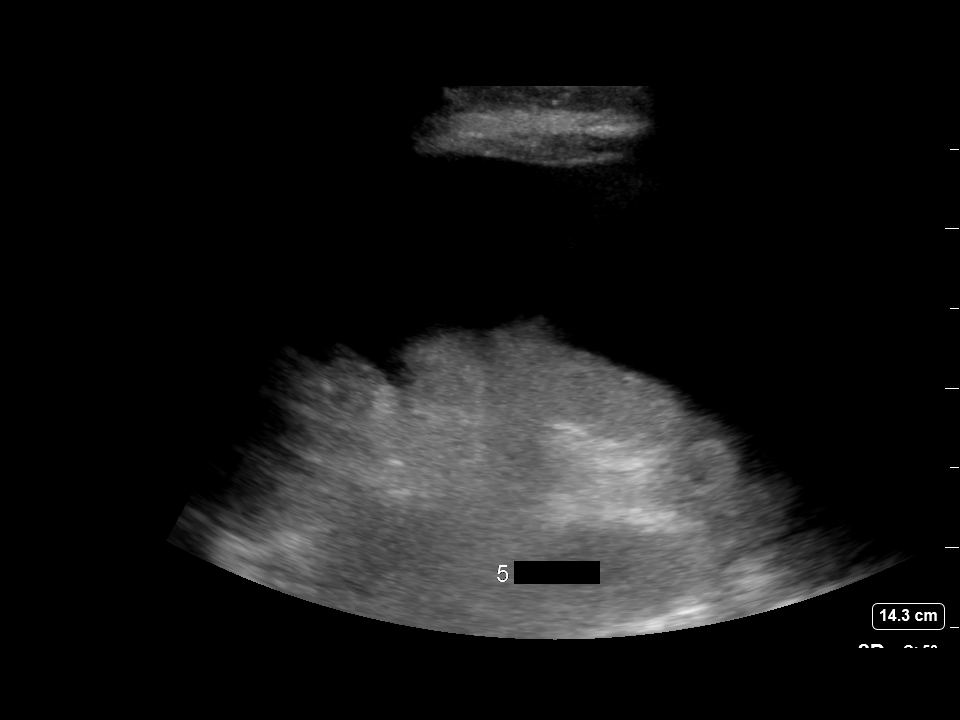

[2 of 2 positions shown; findings below may reference images not displayed]

EXAM:
ULTRASOUND GUIDED RIGHT LOWER QUADRANT PARACENTESIS

MEDICATIONS:
None.

COMPLICATIONS:
None immediate.

PROCEDURE:
Informed written consent was obtained from the patient after a
discussion of the risks, benefits and alternatives to treatment. A
timeout was performed prior to the initiation of the procedure.

Initial ultrasound scanning demonstrates a large amount of ascites
within the right lower abdominal quadrant. The right lower abdomen
was prepped and draped in the usual sterile fashion. 1% lidocaine
was used for local anesthesia.

Following this, a 19 gauge, 7-cm, Yueh catheter was introduced. An
ultrasound image was saved for documentation purposes. The
paracentesis was performed. The catheter was removed and a dressing
was applied. The patient tolerated the procedure well without
immediate post procedural complication.
Patient received post-procedure intravenous albumin; see nursing
notes for details.
FINDINGS: A total of approximately 5 L of clear yellow fluid was removed.
Samples were sent to the laboratory as requested by the clinical
team.
IMPRESSION: Successful ultrasound-guided paracentesis yielding 5 liters of
peritoneal fluid.

## 2019-10-05 MED ORDER — LIDOCAINE HCL (PF) 1 % IJ SOLN
INTRAMUSCULAR | Status: DC | PRN
  Administered 2019-10-05: 10 mL

## 2019-10-05 MED ORDER — ALBUMIN HUMAN 25 % IV SOLN
50.0000 g | Freq: Once | INTRAVENOUS | Status: AC
  Administered 2019-10-05: 50 g via INTRAVENOUS
  Filled 2019-10-05: qty 200

## 2019-10-05 MED ORDER — LIDOCAINE HCL 1 % IJ SOLN
INTRAMUSCULAR | Status: AC
  Filled 2019-10-05: qty 20

## 2019-10-05 MED ORDER — ALBUMIN HUMAN 25 % IV SOLN
INTRAVENOUS | Status: AC
  Filled 2019-10-05: qty 200

## 2019-10-05 NOTE — Procedures (Signed)
PROCEDURE SUMMARY:  Successful US guided paracentesis from RLQ.  Yielded 5 L of clear yellow fluid.  No immediate complications.  Pt tolerated well.   Specimen was sent for labs.  EBL < 53m  KAscencion DikePA-C 10/05/2019 10:15 AM

## 2019-10-06 ENCOUNTER — Telehealth: Payer: Self-pay | Admitting: Gastroenterology

## 2019-10-06 LAB — CYTOLOGY - NON PAP

## 2019-10-06 NOTE — Telephone Encounter (Signed)
Patient came into office and letter was given to patient at this time;

## 2019-10-10 LAB — CULTURE, BODY FLUID W GRAM STAIN -BOTTLE: Culture: NO GROWTH

## 2019-10-13 ENCOUNTER — Other Ambulatory Visit (INDEPENDENT_AMBULATORY_CARE_PROVIDER_SITE_OTHER): Payer: BC Managed Care – PPO

## 2019-10-13 DIAGNOSIS — K7460 Unspecified cirrhosis of liver: Secondary | ICD-10-CM | POA: Diagnosis not present

## 2019-10-13 DIAGNOSIS — I8500 Esophageal varices without bleeding: Secondary | ICD-10-CM | POA: Diagnosis not present

## 2019-10-13 DIAGNOSIS — R188 Other ascites: Secondary | ICD-10-CM

## 2019-10-13 LAB — PROTIME-INR
INR: 1.3 ratio — ABNORMAL HIGH (ref 0.8–1.0)
Prothrombin Time: 15.3 s — ABNORMAL HIGH (ref 9.6–13.1)

## 2019-10-13 NOTE — Telephone Encounter (Signed)
Pt stated that he is returning your call.

## 2019-10-13 NOTE — Addendum Note (Signed)
Addended by: Raliegh Ip on: 10/13/2019 02:10 PM   Modules accepted: Orders

## 2019-10-16 NOTE — Telephone Encounter (Signed)
Left message for patient to call back to the office;  

## 2019-10-16 NOTE — Telephone Encounter (Signed)
Pt returning your call

## 2019-10-16 NOTE — Telephone Encounter (Signed)
Pt called back. °

## 2019-10-17 NOTE — Telephone Encounter (Signed)
Left message for patient to call back to the office;  

## 2019-10-17 NOTE — Telephone Encounter (Signed)
Called and spoke with patient-patient reports he does not need anything from the office or Dr. Bryan Lemma at this time-he is thankful for the work note and will call the office if a need arises;

## 2019-10-23 ENCOUNTER — Other Ambulatory Visit: Payer: Self-pay

## 2019-10-23 ENCOUNTER — Telehealth: Payer: Self-pay | Admitting: Gastroenterology

## 2019-10-23 DIAGNOSIS — K7460 Unspecified cirrhosis of liver: Secondary | ICD-10-CM

## 2019-10-23 DIAGNOSIS — K7581 Nonalcoholic steatohepatitis (NASH): Secondary | ICD-10-CM

## 2019-10-23 DIAGNOSIS — R600 Localized edema: Secondary | ICD-10-CM

## 2019-10-23 NOTE — Telephone Encounter (Signed)
Called and spoke with patient-patient reports he has been trying to call into the office and has not had a response in the last day or two- Patient reports his abdomen is really swollen and he is needing a paracentesis as soon as possible because it is affecting his breathing-patient reports he is not in distress but it has gotten harder to breath over the last few days--  please advise of specifics for paracentesis if appropriate

## 2019-10-23 NOTE — Telephone Encounter (Signed)
Called and informed patient of his paracentesis appt time on 10/24/2019 at 1:00 pm at [facility] -arrival at 12:45pm and to hold his AM doses of diabetes medications; patient is aware of appt date/time;   Call has been placed to Roosevelt Locks, NP in reference to the referral that was requested- the [facility] liver clinic reports they have attempted to reach the patient numerous times without contact with the patient-letter had been mailed to the patient for him to call them to set up the appt with Roosevelt Locks, NP-  Patient was given the number to the [facility] liver care clinic as [phone]; patient was advised to call them and set up his appt-  Patient was also informed to increase his Aldactone (starting on 10/26/2019) to 50 mg per day-patient advised to take medication at the time he will be awake the longest (he works nights) as he will be going to the restroom;   Patient's appt has been moved to sooner -10/31/2019 at 1:40 pm;  Patient advised to call back to the office at [phone] should questions/concerns arise;  Patient verbalized understanding of information/instructions;   lab work was also requested to be completed by the patient in 1 week after paracentesis- patient verbalized understanding of information

## 2019-10-23 NOTE — Telephone Encounter (Signed)
Magnolia from [place] called and requested to speak to you.

## 2019-10-23 NOTE — Telephone Encounter (Signed)
Pt stated that he is returning your call and has been trying to contact you for two days.

## 2019-10-23 NOTE — Telephone Encounter (Signed)
Please place referral to IR for paracentesis.  Goal to remove 7 L.  Infused 12.5 g of 25% albumin for every 2 L removed.  Send fluid for cell count, Gram stain, culture.    Can we also make sure that he was seen by [facility].  Can plan to increase Aldactone to 50 mg/day, with repeat BMP in 1 week.  Will also need expedited follow-up appointment with me in the clinic for continued titration of his diuretics (will probably need to go to Lasix 40 mg/day and Aldactone 100 mg/day pending response and BMP).  Thanks.

## 2019-10-24 ENCOUNTER — Other Ambulatory Visit: Payer: Self-pay

## 2019-10-24 ENCOUNTER — Ambulatory Visit (HOSPITAL_COMMUNITY)
Admission: RE | Admit: 2019-10-24 | Discharge: 2019-10-24 | Disposition: A | Discharge status: Home / Self Care | Payer: BC Managed Care – PPO | Source: Ambulatory Visit | Attending: Gastroenterology | Admitting: Gastroenterology

## 2019-10-24 ENCOUNTER — Encounter (HOSPITAL_COMMUNITY): Payer: Self-pay | Admitting: Physician Assistant

## 2019-10-24 DIAGNOSIS — R188 Other ascites: Secondary | ICD-10-CM | POA: Diagnosis not present

## 2019-10-24 DIAGNOSIS — R600 Localized edema: Secondary | ICD-10-CM

## 2019-10-24 DIAGNOSIS — K7460 Unspecified cirrhosis of liver: Secondary | ICD-10-CM | POA: Diagnosis not present

## 2019-10-24 DIAGNOSIS — K7581 Nonalcoholic steatohepatitis (NASH): Secondary | ICD-10-CM

## 2019-10-24 HISTORY — PX: IR PARACENTESIS: IMG2679

## 2019-10-24 LAB — BODY FLUID CELL COUNT WITH DIFFERENTIAL
Eos, Fluid: 0 %
Lymphs, Fluid: 29 %
Monocyte-Macrophage-Serous Fluid: 68 % (ref 50–90)
Neutrophil Count, Fluid: 3 % (ref 0–25)
Total Nucleated Cell Count, Fluid: 113 cu mm (ref 0–1000)

## 2019-10-24 LAB — GRAM STAIN

## 2019-10-24 IMAGING — US IR PARACENTESIS
1 series · 3 of 3 positions shown · non-contrast
Comparison: none

INDICATION: Patient with history of NASH cirrhosis, recurrent ascites. Request
for diagnostic and therapeutic paracentesis with 7 L limit.

[Series 1: ir (id) (id)/(id)/(id) ir · 3 of 3 slices shown]
[im 1/3]
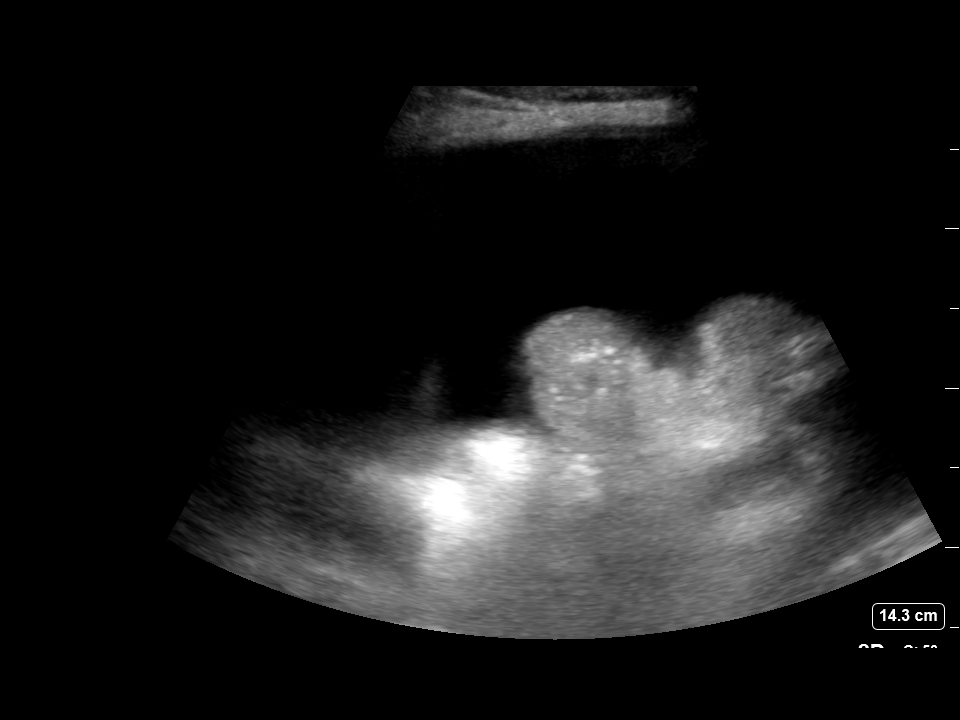
[im 2/3]
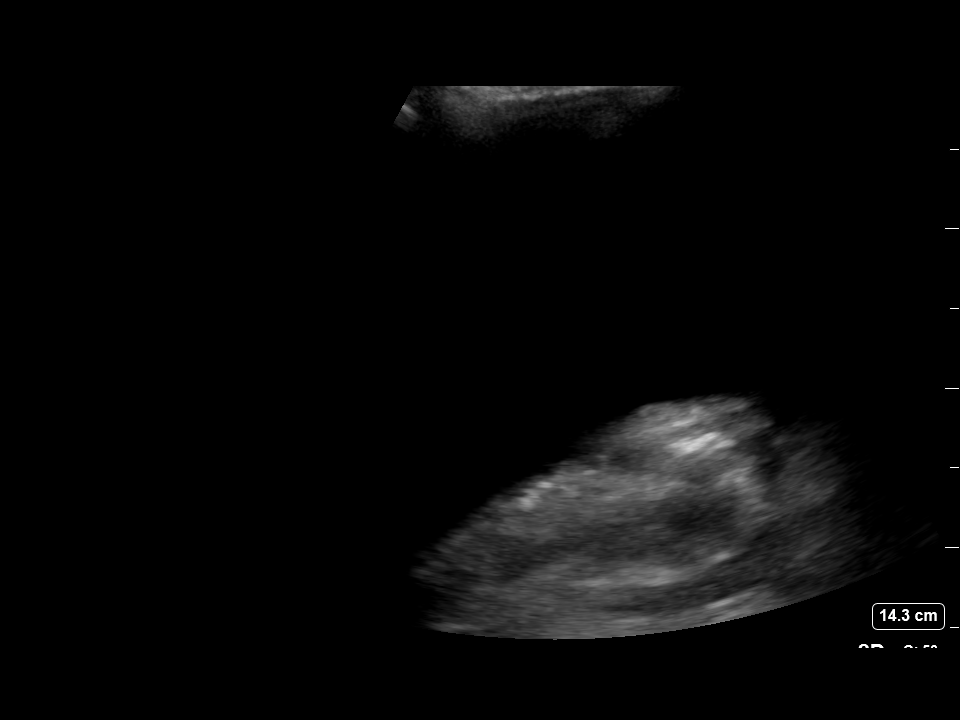
[im 3/3]
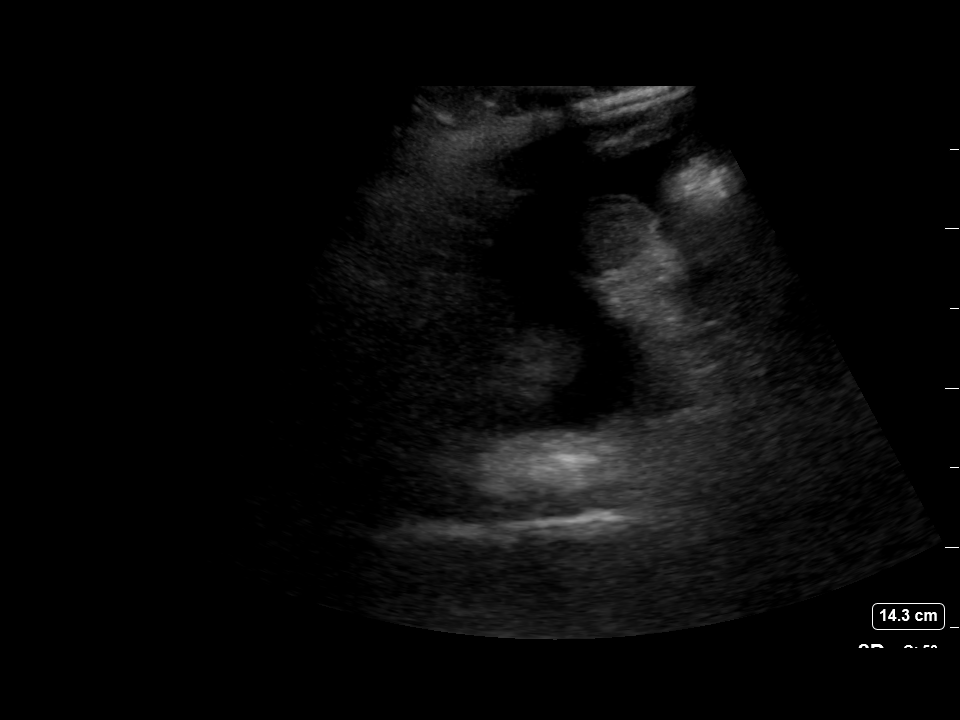

[3 of 3 positions shown; findings below may reference images not displayed]

EXAM:
ULTRASOUND GUIDED DIAGNOSTIC AND THERAPEUTIC PARACENTESIS

MEDICATIONS:
10 mL 1% lidocaine

COMPLICATIONS:
None immediate.

PROCEDURE:
Informed written consent was obtained from the patient after a
discussion of the risks, benefits and alternatives to treatment. A
timeout was performed prior to the initiation of the procedure.

Initial ultrasound scanning demonstrates a large amount of ascites
within the right lower abdominal quadrant. The right lower abdomen
was prepped and draped in the usual sterile fashion. 1% lidocaine
was used for local anesthesia.

Following this, a 19 gauge, 7-cm, Yueh catheter was introduced. An
ultrasound image was saved for documentation purposes. The
paracentesis was performed. The catheter was removed and a dressing
was applied. The patient tolerated the procedure well without
immediate post procedural complication.
Patient received post-procedure intravenous albumin; see nursing
notes for details.
FINDINGS: A total of approximately 7.0 L of clear, light yellow fluid was
removed. Samples were sent to the laboratory as requested by the
clinical team.
IMPRESSION: Successful ultrasound-guided paracentesis yielding 7.0 liters of
peritoneal fluid.

Read by JIM

## 2019-10-24 MED ORDER — ALBUMIN HUMAN 25 % IV SOLN
INTRAVENOUS | Status: AC
  Administered 2019-10-24: 50 g via INTRAVENOUS
  Filled 2019-10-24: qty 200

## 2019-10-24 MED ORDER — LIDOCAINE HCL (PF) 1 % IJ SOLN
INTRAMUSCULAR | Status: DC | PRN
  Administered 2019-10-24: 10 mL

## 2019-10-24 MED ORDER — ALBUMIN HUMAN 25 % IV SOLN
50.0000 g | Freq: Once | INTRAVENOUS | Status: AC
  Administered 2019-10-24: 14:00:00 50 g via INTRAVENOUS

## 2019-10-24 MED ORDER — LIDOCAINE HCL 1 % IJ SOLN
INTRAMUSCULAR | Status: AC
  Filled 2019-10-24: qty 20

## 2019-10-24 NOTE — Procedures (Signed)
PROCEDURE SUMMARY:  Successful image-guided paracentesis from the right lower abdomen.  Yielded 7.0 liters of clear, light yellow fluid which was the requested maximum to be removed. No immediate complications.  EBL: zero Patient tolerated well.   Specimen was sent for labs.  Patient to receive post procedure albumin administration.   Please see imaging section of Epic for full dictation.  Joaquim Nam PA-C 10/24/2019 1:14 PM

## 2019-10-25 LAB — PATHOLOGIST SMEAR REVIEW

## 2019-10-29 LAB — CULTURE, BODY FLUID W GRAM STAIN -BOTTLE: Culture: NO GROWTH

## 2019-10-31 ENCOUNTER — Ambulatory Visit (INDEPENDENT_AMBULATORY_CARE_PROVIDER_SITE_OTHER): Payer: BC Managed Care – PPO | Admitting: Gastroenterology

## 2019-10-31 ENCOUNTER — Encounter: Payer: Self-pay | Admitting: Gastroenterology

## 2019-10-31 ENCOUNTER — Other Ambulatory Visit: Payer: Self-pay

## 2019-10-31 ENCOUNTER — Other Ambulatory Visit (INDEPENDENT_AMBULATORY_CARE_PROVIDER_SITE_OTHER): Payer: BC Managed Care – PPO

## 2019-10-31 VITALS — BP 106/60 | HR 87 | Temp 97.7°F | Ht 73.0 in | Wt 240.5 lb

## 2019-10-31 DIAGNOSIS — K7469 Other cirrhosis of liver: Secondary | ICD-10-CM | POA: Diagnosis not present

## 2019-10-31 DIAGNOSIS — K766 Portal hypertension: Secondary | ICD-10-CM

## 2019-10-31 DIAGNOSIS — R188 Other ascites: Secondary | ICD-10-CM

## 2019-10-31 DIAGNOSIS — D509 Iron deficiency anemia, unspecified: Secondary | ICD-10-CM

## 2019-10-31 DIAGNOSIS — K7460 Unspecified cirrhosis of liver: Secondary | ICD-10-CM | POA: Diagnosis not present

## 2019-10-31 DIAGNOSIS — R600 Localized edema: Secondary | ICD-10-CM | POA: Diagnosis not present

## 2019-10-31 DIAGNOSIS — K7581 Nonalcoholic steatohepatitis (NASH): Secondary | ICD-10-CM

## 2019-10-31 DIAGNOSIS — K3189 Other diseases of stomach and duodenum: Secondary | ICD-10-CM

## 2019-10-31 DIAGNOSIS — R1310 Dysphagia, unspecified: Secondary | ICD-10-CM

## 2019-10-31 LAB — BASIC METABOLIC PANEL
BUN: 23 mg/dL (ref 6–23)
CO2: 26 mEq/L (ref 19–32)
Calcium: 9.1 mg/dL (ref 8.4–10.5)
Chloride: 103 mEq/L (ref 96–112)
Creatinine, Ser: 1.24 mg/dL (ref 0.40–1.50)
GFR: 61.24 mL/min (ref >60.00)
Glucose, Bld: 132 mg/dL — ABNORMAL HIGH (ref 70–99)
Potassium: 4.4 mEq/L (ref 3.5–5.1)
Sodium: 139 mEq/L (ref 135–145)

## 2019-10-31 NOTE — Progress Notes (Signed)
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 in October 2019 with recurrence of variceal bleeding, treated with EGD with EVL x2 bands and cessation of bleeding. [facility] [place]. Cirrhosis complicated by esophageal varices with bleeding requiring multiple EGDs with banding in the past along with propranolol for dual prophylaxis.  Developed large volume ascites in 09/2019, requiring large-volume paracentesis x2 and started on diuretics.  Otherwise no history of hepatic encephalopathy.   Cirrhosis Hx: - Etiology: NASH - Diagnosed 2017 - Liver DU:KGUR - Complications: EV with admission for bleeding 09/2018 treated with EGD with EVL, large volume ascites requiring paracentesis x2 in Oct and Nov 2020 - Rx: Propranolol (dual prophy), Lasix, Aldactone - HCC Screening: AFP <0.9.MRI 05/2019 negative for [place]. Equivocal area of washout in segment 7-recommend repeat MRI in 6 months -Annual lab check: Completed 05/2019-mild iron deficiency. Takes ferrous sulfate -HAV and HBV vaccine series started 05/2019 - MELD:10 - Child Pugh: B (7 pts)  Endoscopic history: -EGD x3 in 2017 in [place], [place] for esophageal varices with banding -EGD (09/2018, Dr. Bryan Lemma): Grade 2 esophageal varices with stigmata of recent bleeding requiring banding x2 with complete eradication, portal hypertensive gastropathy without bleeding. - EGD (12/2018, Dr. Bryan Lemma): Grade II esophageal varices, 6 bands placed with deflation. Normal Z line at 46 cm, severe portal HTN gastropathy with contact oozing, small GOV1, normal duodenum. H pylori serologies negative -EGD (02/2019, Dr. Bryan Lemma): Grade 2 esophageal varices, 4 bands placed with complete eradication.Portal hypertensive gastropathy/2 adenopathy -EGD (07/2019, Dr. Bryan Lemma):  Done for dysphagia. grade 1 esophageal varices, multiple post variceal banding scars in the lower third with 1 area of luminal deformity, likely consistent with prior deep banding site. The lumen was mildly narrowed, but easily traversed and no additional endoscopic dilation performed. Mild esophagitis at the GEJ, portal hypertensive gastropathy, peptic antritis/duodenitis. Papilla prominent.  HPI:     Patient is a 53 y.o. male presenting [facility] for [facility]-up.  Last seen by me on 10/02/2019 for generalized abdominal pain/distention, 25 pound weight gain-went for large volume paracentesis with 5 L removed on 10/22 (high SAAG, no SBP).  Started on Lasix 20/Aldactone 25 mg/day.  Had recurrence of ascites requiring 7 L removed on 10/24/2019.  Placed referral to [facility]- has appt on 12/4 with [facility].  Increased Aldactone to 50 mg/day.  Repeat BMP collected earlier today.  Today, he states he feels much better since paracentesis and starting diuretic therapy. No issue tolerating diutretics.  Weight back to baseline at 240#.  He does c/o abdominal pruritus.  This only occurs while awake, no sleep time symptoms (works at night, so has reversed sleep-wake cycle).  No skin excoriations.  Described as mild.  Does have some discomfort in the upper abdomen along the lower costal margin.  Not frank pain.  Review of systems:     No chest pain, no SOB, no fevers, no urinary sx   Past Medical History:  Diagnosis Date  . Anemia   . Arthritis   . Cirrhosis ([place])   . Diabetes mellitus without complication ([place])   . Fatty liver   . GERD (gastroesophageal reflux disease)   . GI bleed   . Hyperlipidemia   . Hypertension     Patient's surgical history, family medical history, social history, medications and allergies were all reviewed in  Epic    Current Outpatient Medications  Medication Sig Dispense Refill  . cholecalciferol (VITAMIN D) 1000 units tablet Take  1,000 Units by mouth 2 (two) times daily.    . dapagliflozin propanediol (FARXIGA) 10 MG TABS tablet Take 10 mg by mouth every evening.     . ferrous sulfate (KP FERROUS SULFATE) 325 (65 FE) MG tablet Take 1 tablet (325 mg total) by mouth daily with breakfast. 30 tablet 3  . furosemide (LASIX) 20 MG tablet Take 1 tablet (20 mg total) by mouth daily. 90 tablet 0  . glipiZIDE (GLUCOTROL) 10 MG tablet Take 10 mg by mouth 2 (two) times daily.    Marland Kitchen lisinopril (PRINIVIL,ZESTRIL) 20 MG tablet Take 20 mg by mouth 2 (two) times daily.    . metFORMIN (GLUCOPHAGE) 1000 MG tablet Take 1,000 mg by mouth 2 (two) times daily with a meal.    . pantoprazole (PROTONIX) 40 MG tablet Take 1 tablet (40 mg total) by mouth 2 (two) times daily. 60 tablet 3  . propranolol (INDERAL) 40 MG tablet Take 40 mg by mouth 2 (two) times daily.     Marland Kitchen spironolactone (ALDACTONE) 25 MG tablet Take 1 tablet (25 mg total) by mouth daily. 90 tablet 0  . sucralfate (CARAFATE) 1 GM/10ML suspension Take 10 mLs (1 g total) by mouth 4 (four) times daily -  with meals and at bedtime. 1200 mL 0   No current facility-administered medications for this visit.     Physical Exam:     Temp 97.7 F (36.5 C)   Ht 6' 1"  (1.854 m)   Wt 240 lb 8 oz (109.1 kg)   BMI 31.73 kg/m   GENERAL:  Pleasant male in NAD PSYCH: : Cooperative, normal affect EENT:  conjunctiva pink, mucous membranes moist, neck supple without masses CARDIAC:  RRR, no murmur heard, no peripheral edema PULM: Normal respiratory effort, lungs CTA bilaterally, no wheezing ABDOMEN: Ascites, improved from previous. Soft, nontender.  normal bowel sounds SKIN:  turgor, no lesions seen Musculoskeletal:  Normal muscle tone, normal strength NEURO: Alert and oriented x 3, no focal neurologic deficits   IMPRESSION and PLAN:    1)NASH Cirrhosis: 2) Ascites 3) Lower extremity edema-resolved  History ofNASH cirrhosis with decompensation as manifested by bleeding esophageal  varices, and large volume ascites now s/p large-volume paracentesis x2 and started on diuretics.   -BMP collected earlier today; results pending -If renal function/electrolytes preserved, plan to uptitrate Lasix, repeat BMP, then Aldactone as appropriate -Has appointment with [facility] [place] at [facility] scheduled -UTD on vaccine series and labs -Can consider repeat EGD in 07/2020 for EV surveillance -Repeat MRI scheduled for 11/29/2019 for ongoing [place] screening - Resume <2gm Na diet -Complete viral hepatitis vaccine series as scheduled  4) Esophageal varices: 5) Portal hypertensive gastropathy -Resume propranolol for dual prophylaxis given history of bleed -Resume PPI for both reflux and portal hypertensive gastritis -Repeat EGD in 1 year for EV surveillance  6)Mild Iron reduction: Mildly reduced iron indicesin 2019. Had been taking oral iron therapy and has continued to take without any medication ADRs,with improvement inH/H at11.8/35.2 (from 7.7/23).  - Resume PO iron -Repeat iron indicesin 3 to 6 months  7) Dysphagia: Resolved. There is a 2 to 3% incidence of dysphagia following EVL. Monitor for symptom recurrence  8) GERD: -Well controlled with PPI therapy -Resume PPI -Resume antireflux lifestyle modifications  -RTC in 3 months or sooner as needed  I spent well over 25 minutes of face-to-face time  with the patient. Greater than 50% of the time was spent counseling and coordinating care.       Lavena Bullion ,DO, FACG 10/31/2019, 1:37 PM

## 2019-10-31 NOTE — Patient Instructions (Signed)
If you are age 52 or older, your body mass index should be between 23-30. Your Body mass index is 31.73 kg/m. If this is out of the aforementioned range listed, please consider follow up with your Primary Care Provider.  If you are age 30 or younger, your body mass index should be between 19-25. Your Body mass index is 31.73 kg/m. If this is out of the aformentioned range listed, please consider follow up with your Primary Care Provider.    Return office visit in 3 months  It was a pleasure to see you today!  Vito Cirigliano, D.O.

## 2019-11-02 ENCOUNTER — Other Ambulatory Visit: Payer: Self-pay | Admitting: Gastroenterology

## 2019-11-02 DIAGNOSIS — K7460 Unspecified cirrhosis of liver: Secondary | ICD-10-CM

## 2019-11-02 DIAGNOSIS — R188 Other ascites: Secondary | ICD-10-CM

## 2019-11-06 ENCOUNTER — Ambulatory Visit: Payer: BC Managed Care – PPO | Admitting: Gastroenterology

## 2019-11-08 ENCOUNTER — Other Ambulatory Visit (INDEPENDENT_AMBULATORY_CARE_PROVIDER_SITE_OTHER): Payer: BC Managed Care – PPO

## 2019-11-08 DIAGNOSIS — K7460 Unspecified cirrhosis of liver: Secondary | ICD-10-CM | POA: Diagnosis not present

## 2019-11-08 DIAGNOSIS — R188 Other ascites: Secondary | ICD-10-CM

## 2019-11-08 LAB — BASIC METABOLIC PANEL
BUN: 23 mg/dL (ref 6–23)
CO2: 24 mEq/L (ref 19–32)
Calcium: 8.8 mg/dL (ref 8.4–10.5)
Chloride: 103 mEq/L (ref 96–112)
Creatinine, Ser: 1.41 mg/dL (ref 0.40–1.50)
GFR: 52.8 mL/min — ABNORMAL LOW (ref >60.00)
Glucose, Bld: 75 mg/dL (ref 70–99)
Potassium: 4.4 mEq/L (ref 3.5–5.1)
Sodium: 137 mEq/L (ref 135–145)

## 2019-11-13 ENCOUNTER — Other Ambulatory Visit: Payer: Self-pay | Admitting: Gastroenterology

## 2019-11-13 ENCOUNTER — Telehealth: Payer: Self-pay | Admitting: Gastroenterology

## 2019-11-13 DIAGNOSIS — K7460 Unspecified cirrhosis of liver: Secondary | ICD-10-CM

## 2019-11-13 NOTE — Telephone Encounter (Signed)
Patient called and spoke with CMA and requested to be scheduled for a paracentesis- Please advise

## 2019-11-13 NOTE — Telephone Encounter (Signed)
Please call this patient and let him know that [facility] MRI dept may have called him, however, RN did not try to reach patient; please give him the number to call them at [phone]

## 2019-11-14 ENCOUNTER — Other Ambulatory Visit: Payer: Self-pay

## 2019-11-14 DIAGNOSIS — K7460 Unspecified cirrhosis of liver: Secondary | ICD-10-CM

## 2019-11-14 NOTE — Telephone Encounter (Signed)
I tried calling Martin Strong this morning to discuss with him as well. No answer. 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 I see that a referral has been placed to Nephrology (maybe can have some benefit from trial of Midodrine?) and has an appt with [facility] this week.   Please place referral to IR for paracentesis.  Goal to remove 7 L.  Infuse 12.5 g of 25% albumin for every 2 L removed. Send fluid for cell count, Gram stain, culture.   Please also schedule f/u app with me. Will plan to discuss his appt with [facility] along with discussion of possible TIPS for refractory ascites. Thanks.

## 2019-11-14 NOTE — Telephone Encounter (Addendum)
Orders for IR paracentesis and IR TIPS has been placed in Epic along with order for Albumin order;  Patient has been scheduled for procedure on 11/16/2019 at 9:00 am at [facility]; patient also given the number to call to cancel/reschedule this appt at [phone]; Patient advised to call back to the office at [phone] should questions/concerns arise;  Patient verbalized understanding of information/instructions;

## 2019-11-14 NOTE — Telephone Encounter (Signed)
I spoke with Martin Strong this morning.  Discussed medical management, repeat paracentesis, and discussed referral for TIPS evaluation.  We additionally discussed possible eventual evaluation for liver transplant; he has an appointment with [facility] later this week as below.  While his current  MELD is low, the refractory ascites is certainly concerning.  Per current literature, TIPS does not preclude eventual liver transplant.  He is certainly interested in TIPS as a means to better control his ascites and forego serial therapeutic paracentesis.  -Please place referral to IR for TIPS evaluation -Thank you for setting him up for his repeat paracentesis along with referral to Nephrology and [place] as already in place -Scheduled to see [place] this week.  I will look for that referral note to review.

## 2019-11-15 NOTE — Telephone Encounter (Signed)
IR (Martin Strong) has been contacted-Vikie will call and speak with patient concerning TIPS procedure

## 2019-11-16 ENCOUNTER — Ambulatory Visit (HOSPITAL_COMMUNITY)
Admission: RE | Admit: 2019-11-16 | Discharge: 2019-11-16 | Disposition: A | Discharge status: Home / Self Care | Payer: BC Managed Care – PPO | Source: Ambulatory Visit | Attending: Gastroenterology | Admitting: Gastroenterology

## 2019-11-16 ENCOUNTER — Other Ambulatory Visit: Payer: Self-pay

## 2019-11-16 ENCOUNTER — Encounter (HOSPITAL_COMMUNITY): Payer: Self-pay | Admitting: Radiology

## 2019-11-16 DIAGNOSIS — R188 Other ascites: Secondary | ICD-10-CM | POA: Insufficient documentation

## 2019-11-16 DIAGNOSIS — K7460 Unspecified cirrhosis of liver: Secondary | ICD-10-CM | POA: Diagnosis present

## 2019-11-16 HISTORY — PX: IR PARACENTESIS: IMG2679

## 2019-11-16 LAB — BODY FLUID CELL COUNT WITH DIFFERENTIAL
Eos, Fluid: 0 %
Lymphs, Fluid: 75 %
Monocyte-Macrophage-Serous Fluid: 20 % — ABNORMAL LOW (ref 50–90)
Neutrophil Count, Fluid: 5 % (ref 0–25)
Total Nucleated Cell Count, Fluid: 80 cu mm (ref 0–1000)

## 2019-11-16 LAB — GRAM STAIN

## 2019-11-16 IMAGING — US IR PARACENTESIS
1 series · 2 of 2 positions shown · non-contrast
Comparison: none

INDICATION: Patient with history of cirrhosis, recurrent ascites. Request made
for diagnostic and therapeutic paracentesis up to 7 liters.

[Series 1: (id) · 2 of 2 slices shown]
[im 1/2]
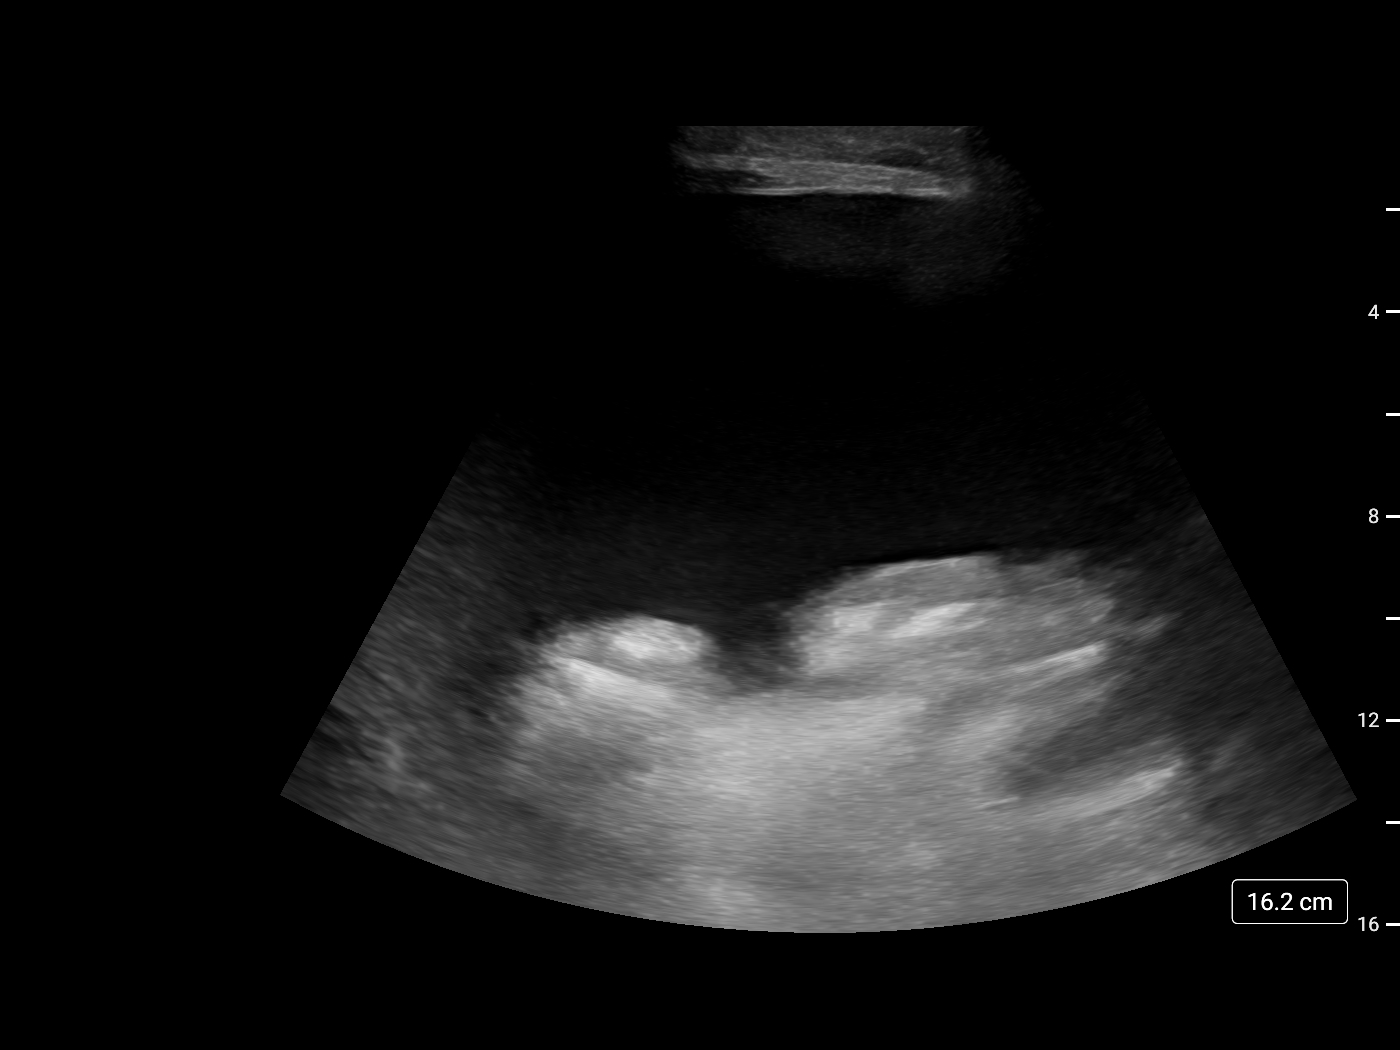
[im 2/2]
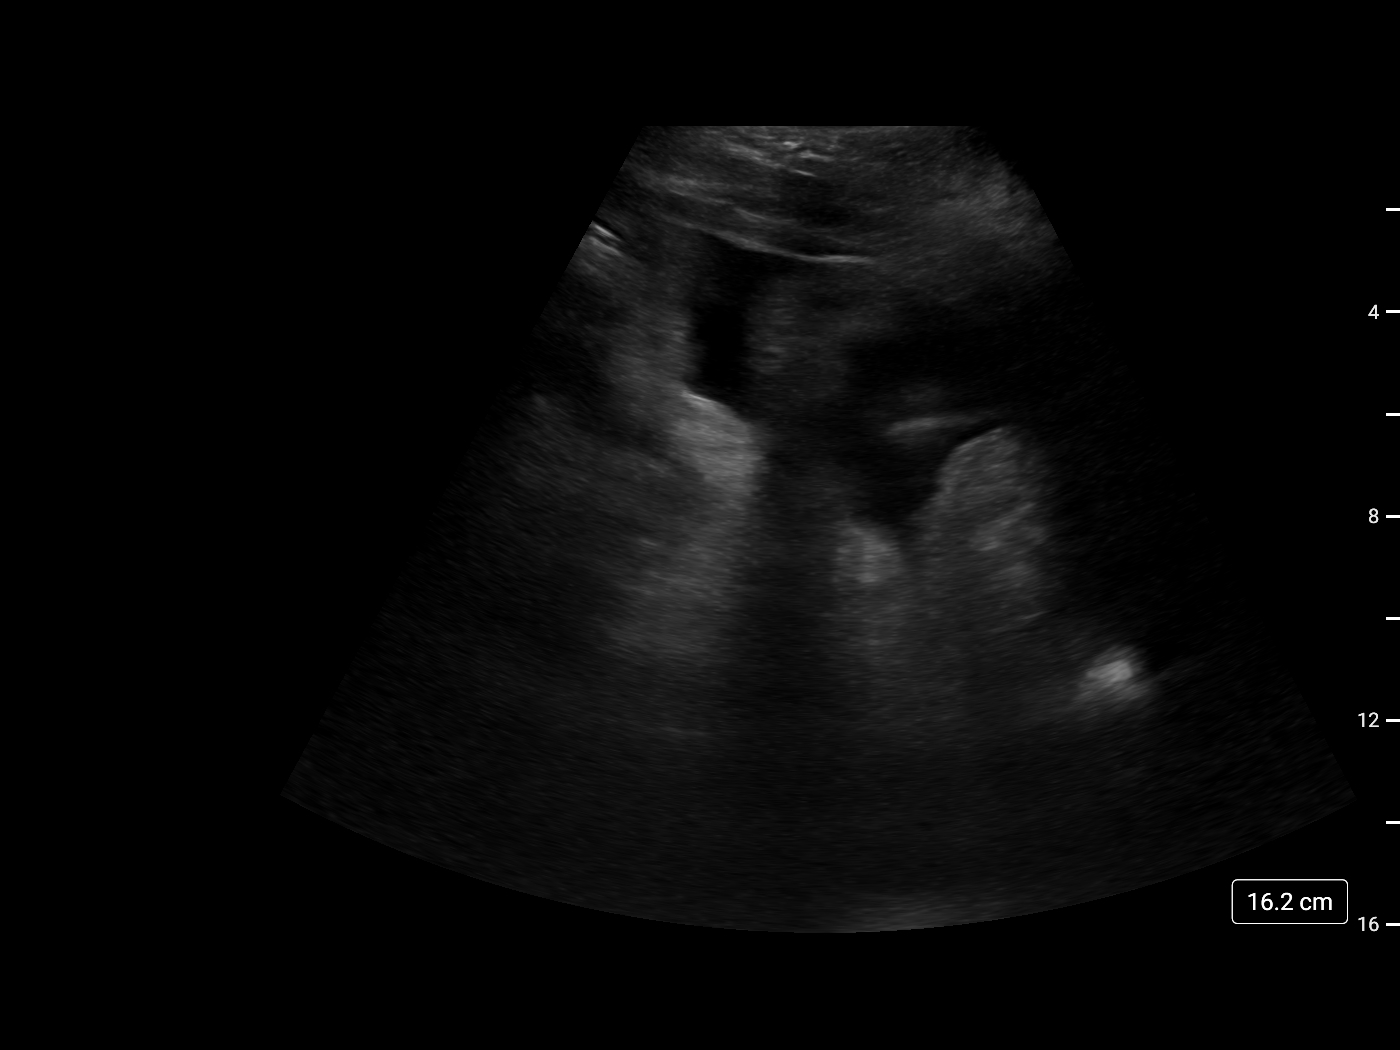

[2 of 2 positions shown; findings below may reference images not displayed]

EXAM:
ULTRASOUND GUIDED DIAGNOSTIC AND THERAPEUTIC PARACENTESIS

MEDICATIONS:
None

COMPLICATIONS:
None immediate.

PROCEDURE:
Informed written consent was obtained from the patient after a
discussion of the risks, benefits and alternatives to treatment. A
timeout was performed prior to the initiation of the procedure.

Initial ultrasound scanning demonstrates a large amount of ascites
within the right mid to lower abdominal quadrant. The right mid to
lower abdomen was prepped and draped in the usual sterile fashion.
1% lidocaine was used for local anesthesia.

Following this, a 19 gauge, 7-cm, Yueh catheter was introduced. An
ultrasound image was saved for documentation purposes. The
paracentesis was performed. The catheter was removed and a dressing
was applied. The patient tolerated the procedure well without
immediate post procedural complication.
Patient received post-procedure intravenous albumin; see nursing
notes for details.
FINDINGS: A total of approximately 7 liters of yellow fluid was removed.
Samples were sent to the laboratory as requested by the clinical
team.
IMPRESSION: Successful ultrasound-guided diagnostic and therapeutic paracentesis
yielding 7 liters of peritoneal fluid.

## 2019-11-16 MED ORDER — LIDOCAINE HCL 1 % IJ SOLN
INTRAMUSCULAR | Status: AC
  Filled 2019-11-16: qty 20

## 2019-11-16 MED ORDER — ALBUMIN HUMAN 25 % IV SOLN
37.5000 g | Freq: Once | INTRAVENOUS | Status: AC
  Administered 2019-11-16: 37.5 g via INTRAVENOUS

## 2019-11-16 MED ORDER — ALBUMIN HUMAN 25 % IV SOLN
INTRAVENOUS | Status: AC
  Filled 2019-11-16: qty 150

## 2019-11-16 MED ORDER — ALBUMIN HUMAN 25 % IV SOLN: 12.5000 g | Freq: Once | INTRAVENOUS | Status: DC

## 2019-11-16 MED ORDER — LIDOCAINE HCL (PF) 1 % IJ SOLN
INTRAMUSCULAR | Status: DC | PRN
  Administered 2019-11-16: 10 mL

## 2019-11-16 NOTE — Procedures (Addendum)
Ultrasound-guided diagnostic and therapeutic paracentesis performed yielding 7 liters (maximum ordered) of yellow fluid. No immediate complications. A portion of the fluid was sent to the lab for preordered studies. EBL none. Pt to receive IV albumin postprocedure.

## 2019-11-17 LAB — PATHOLOGIST SMEAR REVIEW

## 2019-11-21 LAB — CULTURE, BODY FLUID W GRAM STAIN -BOTTLE: Culture: NO GROWTH

## 2019-11-29 ENCOUNTER — Ambulatory Visit (HOSPITAL_COMMUNITY)
Admission: RE | Admit: 2019-11-29 | Discharge: 2019-11-29 | Disposition: A | Discharge status: Home / Self Care | Payer: BC Managed Care – PPO | Source: Ambulatory Visit | Attending: Gastroenterology | Admitting: Gastroenterology

## 2019-11-29 ENCOUNTER — Other Ambulatory Visit: Payer: Self-pay

## 2019-11-29 DIAGNOSIS — I8500 Esophageal varices without bleeding: Secondary | ICD-10-CM | POA: Diagnosis present

## 2019-11-29 DIAGNOSIS — K7581 Nonalcoholic steatohepatitis (NASH): Secondary | ICD-10-CM | POA: Diagnosis present

## 2019-11-29 IMAGING — MR MR ABDOMEN WO/W CM
9 of 18 series · 21 of 48 positions shown · IV contrast (gadavist)
Comparison: CT [DATE], MRI [DATE]

CLINICAL DATA: [place] cirrhosis

EXAM:
MRI ABDOMEN WITHOUT AND WITH CONTRAST
TECHNIQUE: Multiplanar multisequence MR imaging of the abdomen was performed
both before and after the administration of intravenous contrast.
CONTRAST:  10mL GADAVIST GADOBUTROL 1 MMOL/ML IV SOLN

[Series 3: T2 fat-sat · axial · 5.0mm · 0.94mm/px · z∈[-46,+194]mm · 3 of 49 slices shown]
[im 1/49]
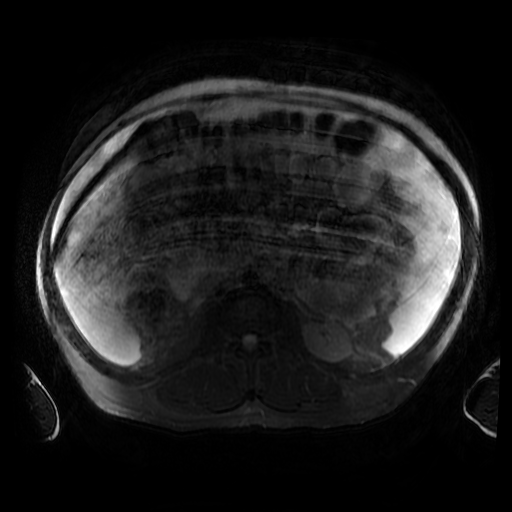
[im 25/49]
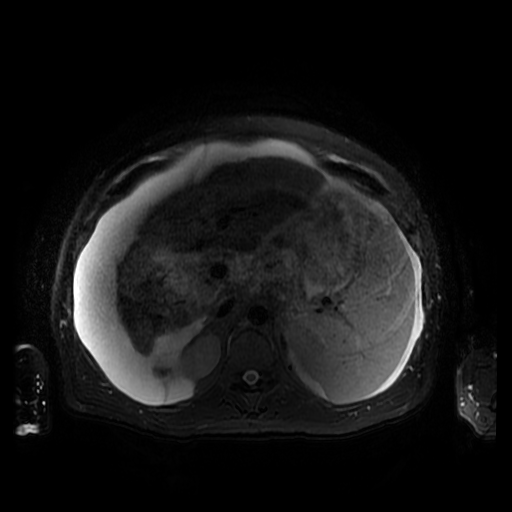
[im 49/49]
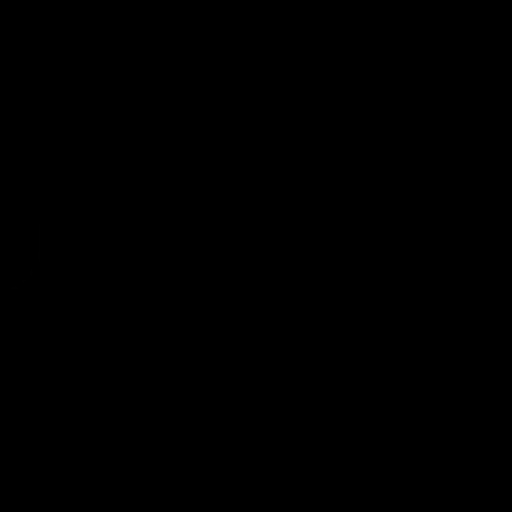

[Series 4: DWI b500 · axial · 6.0mm · 1.88mm/px · z∈[-46,+180]mm · 2 of 60 slices shown]
[im 1/60]
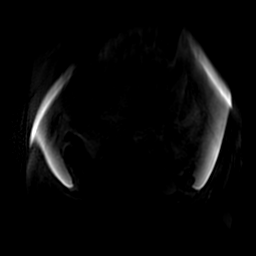
[im 60/60]
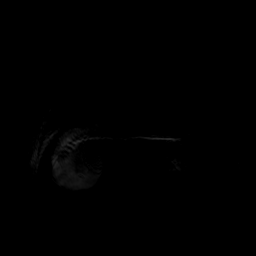

[Series 6: T2 · coronal · 5.0mm · 0.90mm/px · 1 of 15 slices shown (1 of 2)]
[im 1/15]
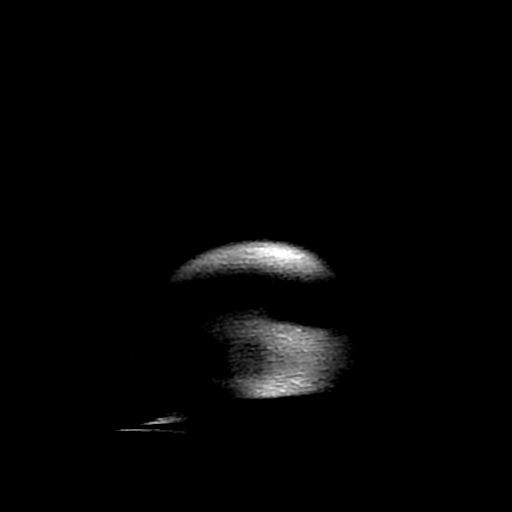

[Series 8: T2 · axial · 5.0mm · 0.94mm/px · z∈[-46,+209]mm · 2 of 52 slices shown (2 of 2)]
[im 1/52]
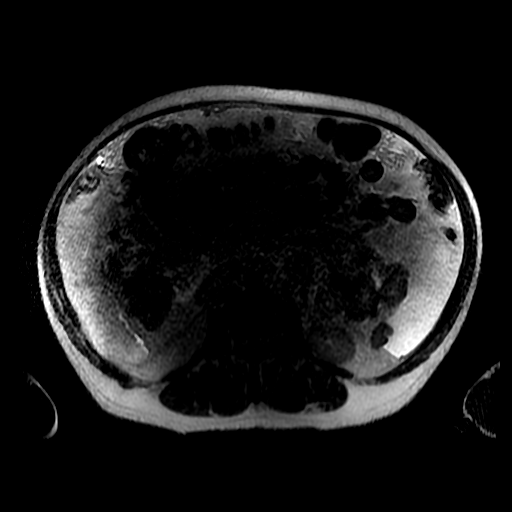
[im 52/52]
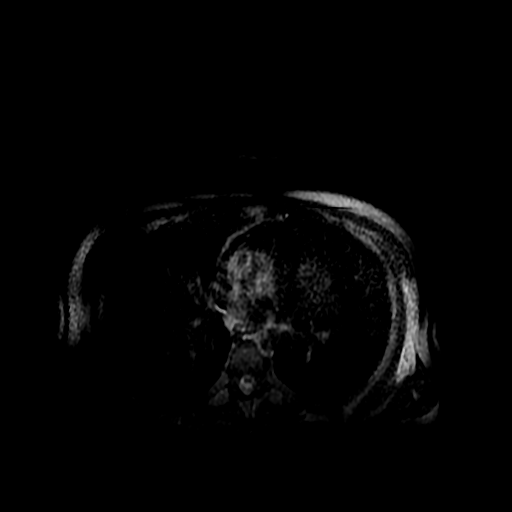

[Series 9: bSSFP · axial · 5.0mm · 0.94mm/px · z∈[-46,+209]mm · 2 of 52 slices shown]
[im 1/52]
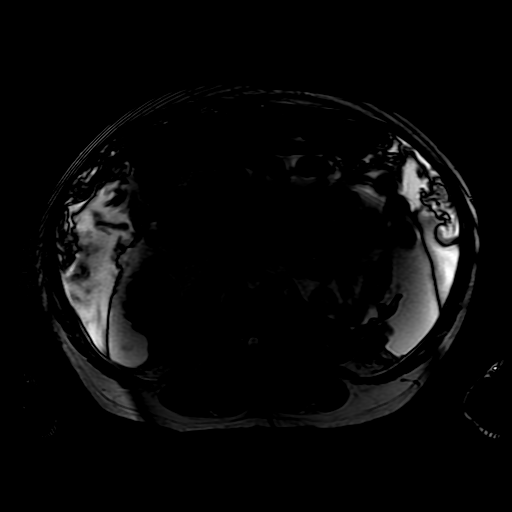
[im 52/52]
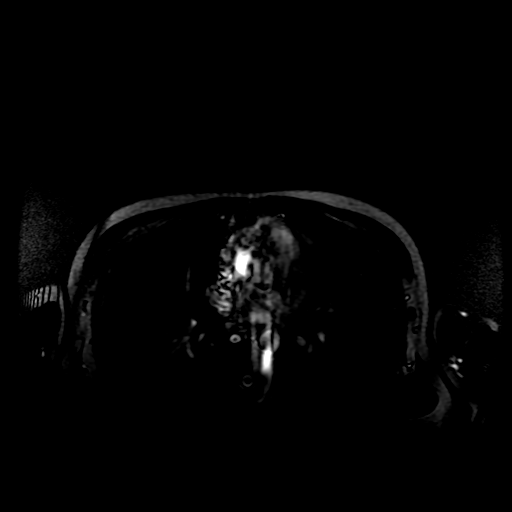

[Series 10: ax dualecho bh · axial · 5.0mm · 0.94mm/px · z∈[-46,+209]mm · 4 of 104 slices shown]
[im 1/104]
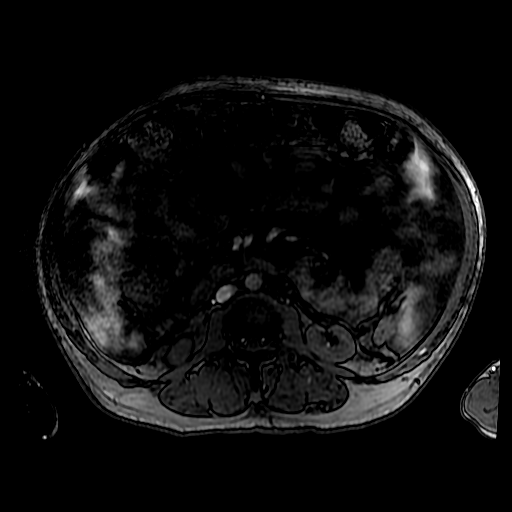
[im 35/104]
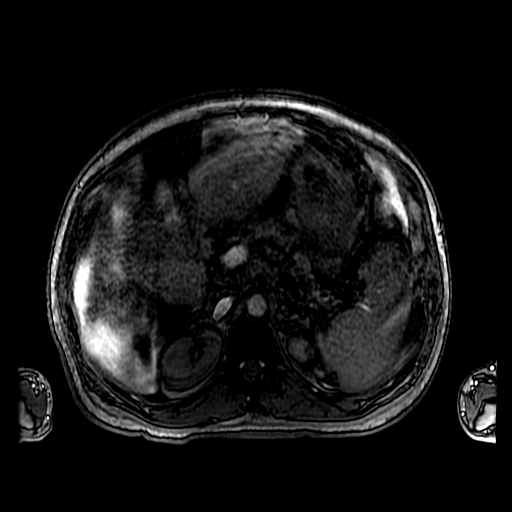
[im 69/104]
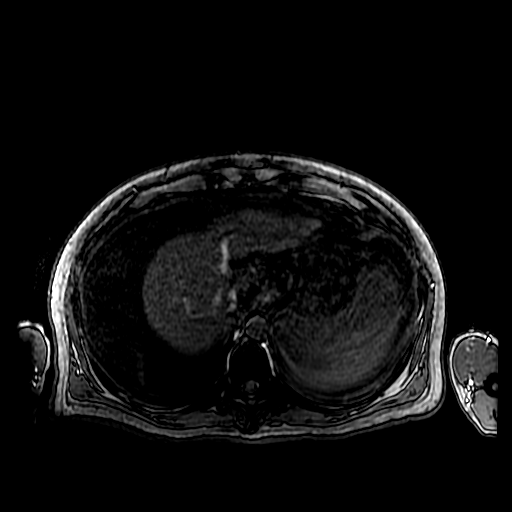
[im 104/104]
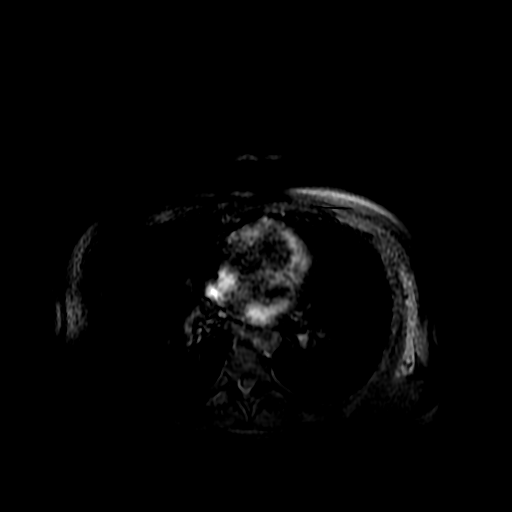

[Series 400: DWI · axial · 6.0mm · 1.88mm/px · 1 of 30 slices shown]
[im 1/30]
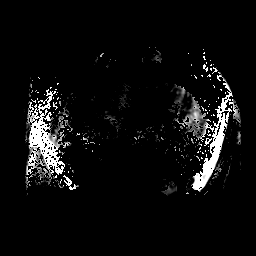

[Series 1100: T1 dynamic · axial · 5.8mm · 0.94mm/px · z∈[-15,+237]mm · 3 of 88 slices shown (1 of 2)]
[im 1/88]
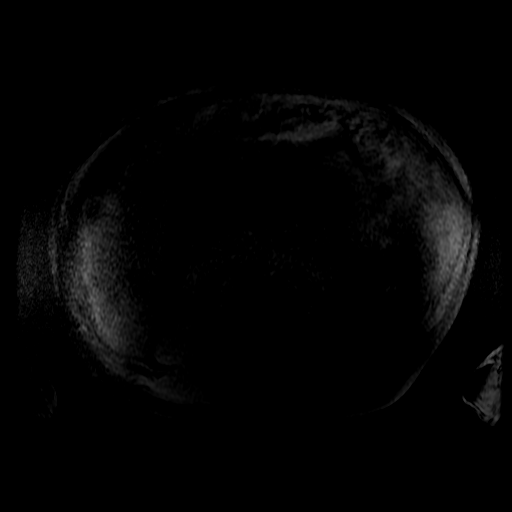
[im 44/88]
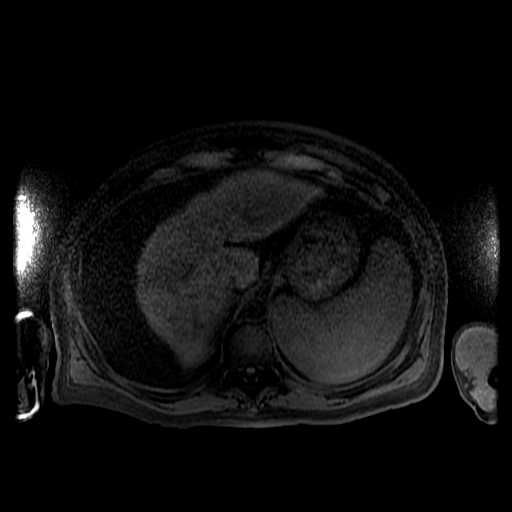
[im 88/88]
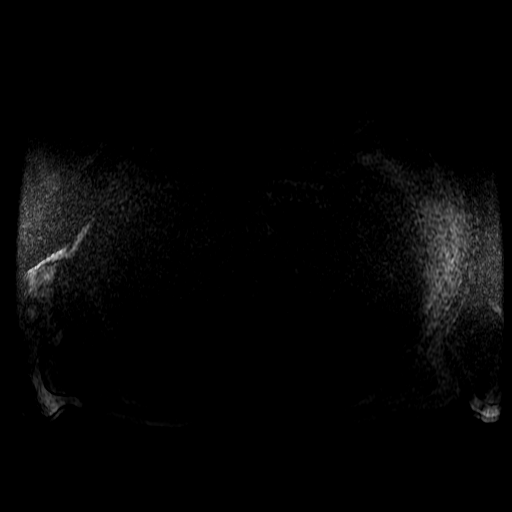

[Series 1101: T1 dynamic · axial · 5.8mm · 0.94mm/px · z∈[-15,+237]mm · 3 of 88 slices shown (2 of 2)]
[im 1/88]
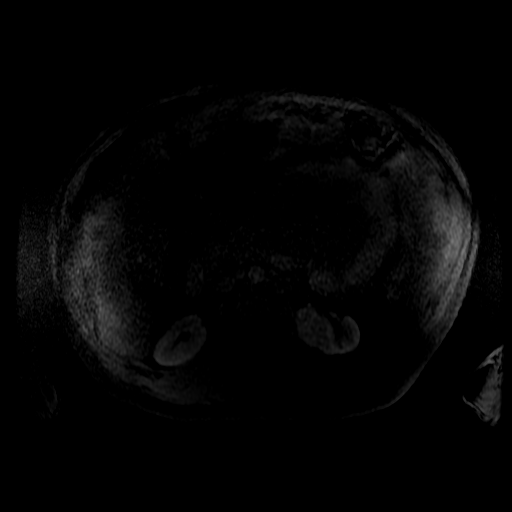
[im 44/88]
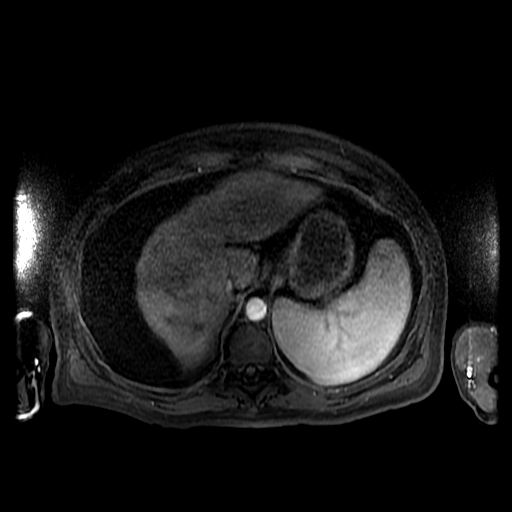
[im 88/88]
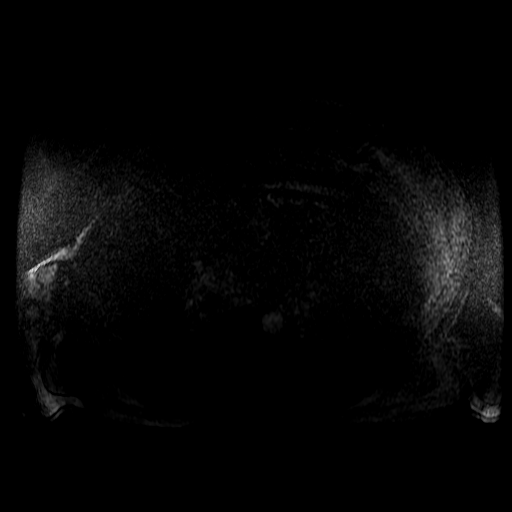

[21 of 48 positions shown; findings below may reference images not displayed]

FINDINGS: Lower chest:  Lung bases are clear.

Hepatobiliary: Liver has a shrunken nodular contour. No intrahepatic
biliary duct dilatation. There is marked increase in ascitic fluid
surrounding the LEFT and RIGHT hepatic lobe. Fluid measures 4.8 cm
in depth adjacent to the RIGHT hepatic lobe compared to 1.4 cm MRI
[DATE].

Linear high T2 signal and linear enhancement liver consistent with
fibrosis. No discrete enhancing lesions to suggest hepatoma

The gallbladder is distended to 6 cm. There is sludge layering in
the gallbladder. No extrahepatic biliary duct dilatation

Pancreas: Normal pancreatic parenchymal intensity. No ductal
dilatation or inflammation.

Spleen: Spleen is enlarged measuring 15.5 x 14.5 x 7.2 cm (volume =
850 cm^3). Splenic veins and portal veins are patent. Venous
collateralization noted inferior to the spleen.

Adrenals/urinary tract: Adrenal glands and kidneys are normal.

Stomach/Bowel: Stomach and limited of the small bowel is
unremarkable

Vascular/Lymphatic: Abdominal aortic normal caliber. No
retroperitoneal periportal lymphadenopathy.

Musculoskeletal: No aggressive osseous lesion
IMPRESSION: 1. Significant interval increase in ascites.
2. Cirrhotic morphology of liver WITHOUT enhancing lesion to suggest
hepatoma.
3. Gallbladder distension. No evidence acute cholecystitis. No intra
or extrahepatic biliary duct dilatation.
4. Normal pancreas.
5. Splenomegaly consistent with portal hypertension

## 2019-11-29 MED ORDER — GADOBUTROL 1 MMOL/ML IV SOLN
10.0000 mL | Freq: Once | INTRAVENOUS | Status: AC | PRN
  Administered 2019-11-29: 11:00:00 10 mL via INTRAVENOUS

## 2019-11-30 NOTE — Telephone Encounter (Signed)
Please see additional documentation for this patient

## 2019-11-30 NOTE — Telephone Encounter (Signed)
Pt stated that he is returning your call from a voicemail.

## 2019-12-05 ENCOUNTER — Other Ambulatory Visit: Payer: Self-pay

## 2019-12-05 DIAGNOSIS — K7460 Unspecified cirrhosis of liver: Secondary | ICD-10-CM

## 2019-12-05 DIAGNOSIS — K7581 Nonalcoholic steatohepatitis (NASH): Secondary | ICD-10-CM

## 2019-12-13 ENCOUNTER — Other Ambulatory Visit: Payer: Self-pay | Admitting: Gastroenterology

## 2019-12-14 NOTE — Telephone Encounter (Signed)
May I refill Sir?

## 2019-12-18 NOTE — Telephone Encounter (Signed)
Ok to refill. Thanks!

## 2019-12-19 ENCOUNTER — Other Ambulatory Visit: Payer: Self-pay | Admitting: Gastroenterology

## 2019-12-19 ENCOUNTER — Encounter: Payer: Self-pay | Admitting: *Deleted

## 2019-12-19 ENCOUNTER — Ambulatory Visit
Admission: RE | Admit: 2019-12-19 | Discharge: 2019-12-19 | Disposition: A | Discharge status: Home / Self Care | Payer: BC Managed Care – PPO | Source: Ambulatory Visit | Attending: Gastroenterology | Admitting: Gastroenterology

## 2019-12-19 ENCOUNTER — Other Ambulatory Visit: Payer: Self-pay

## 2019-12-19 DIAGNOSIS — K7460 Unspecified cirrhosis of liver: Secondary | ICD-10-CM

## 2019-12-19 DIAGNOSIS — R188 Other ascites: Secondary | ICD-10-CM

## 2019-12-19 HISTORY — PX: IR RADIOLOGIST EVAL & MGMT: IMG5224

## 2019-12-19 NOTE — Consult Note (Signed)
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    Referring Physician(s): Cirigliano,Vito V  History of Present Illness: Martin Strong is a 54 y.o. male with a history of nonalcoholic steatohepatitis related cirrhosis initially diagnosed in February 1829 and complicated by bleeding esophageal varices in October 2019 which was successfully treated by endoscopic banding and propranolol for dual prophylaxis.  Additionally, patient began to develop decompensation and large-volume ascites in October 2020 requiring large-volume paracentesis with removal of 5 L of ascites and initiation of oral diuretic therapy.  He subsequently required additional paracentesis on 10/24/2019 with removal of 7 L of ascites, and again on 11/16/2019 with removal of 7 L of ascites.  He has not yet required additional paracentesis over the past month.  However, Martin Strong does report that his abdomen is again becoming distended and he is considering going in for another paracentesis soon.  Additionally, on review of systems he reports that he has felt very cold lately and finds it difficult to stay warm.  Additionally, he has felt weak and occasionally lightheaded.  He has no prior history of hepatic encephalopathy and denies confusion or clumsiness.  He has no cardiac history that he is aware of.  Past Medical History:  Diagnosis Date  . Anemia   . Arthritis   . Cirrhosis ([place])   . Diabetes mellitus without complication ([place])   . Fatty liver   . GERD (gastroesophageal reflux disease)   . GI bleed   . Hyperlipidemia   . Hypertension     Past Surgical History:  Procedure Laterality Date  . BIOPSY  02/14/2019   Procedure: BIOPSY;  Surgeon: Lavena Bullion, DO;  Location: [facility];  Service: Gastroenterology;;  . BIOPSY  07/22/2019   Procedure: BIOPSY;  Surgeon: Lavena Bullion, DO;  Location: [facility];  Service: Gastroenterology;;  . ESOPHAGEAL BANDING  10/07/2018   Procedure: ESOPHAGEAL BANDING;  Surgeon: Lavena Bullion, DO;  Location: [facility];  Service: Gastroenterology;;  . ESOPHAGEAL BANDING N/A 01/03/2019   Procedure: ESOPHAGEAL BANDING;  Surgeon: Lavena Bullion, DO;  Location: [facility];  Service: Gastroenterology;  Laterality: N/A;  . ESOPHAGEAL BANDING  02/14/2019   Procedure: ESOPHAGEAL BANDING;  Surgeon: Lavena Bullion, DO;  Location: [facility];  Service: Gastroenterology;;  . esophageal bands    . ESOPHAGOGASTRODUODENOSCOPY Left 07/22/2019   Procedure: ESOPHAGOGASTRODUODENOSCOPY (EGD) WITH POSSIBLE BANDING;  Surgeon: Lavena Bullion, DO;  Location: [facility];  Service: Gastroenterology;  Laterality: Left;  . ESOPHAGOGASTRODUODENOSCOPY (EGD) WITH PROPOFOL N/A 10/07/2018   Procedure: ESOPHAGOGASTRODUODENOSCOPY (EGD) WITH PROPOFOL;  Surgeon: Lavena Bullion, DO;  Location: [facility];  Service: Gastroenterology;  Laterality: N/A;  . ESOPHAGOGASTRODUODENOSCOPY (EGD) WITH PROPOFOL N/A 01/03/2019   Procedure: ESOPHAGOGASTRODUODENOSCOPY (EGD) WITH PROPOFOL;  Surgeon: Lavena Bullion, DO;  Location: [facility];  Service: Gastroenterology;  Laterality: N/A;  . ESOPHAGOGASTRODUODENOSCOPY (EGD) WITH PROPOFOL N/A 02/14/2019   Procedure: ESOPHAGOGASTRODUODENOSCOPY (EGD) WITH PROPOFOL;  Surgeon: Lavena Bullion, DO;  Location: [facility];  Service: Gastroenterology;  Laterality: N/A;  . IR PARACENTESIS  10/05/2019  . IR PARACENTESIS  10/24/2019  . IR PARACENTESIS  11/16/2019    Allergies: Nadolol  Medications: Prior to Admission medications   Medication Sig Start Date End Date Taking? Authorizing Provider  cholecalciferol (VITAMIN D) 1000 units tablet Take 1,000 Units by mouth 2 (two) times daily.    [provider]  dapagliflozin propanediol (FARXIGA) 10  MG TABS tablet Take 10 mg by mouth every evening.     [provider]    ferrous sulfate (KP FERROUS SULFATE) 325 (65 FE) MG tablet Take 1 tablet (325 mg total) by mouth daily with breakfast. 05/30/19   Cirigliano, Vito V, DO  furosemide (LASIX) 20 MG tablet Take 1 tablet (20 mg total) by mouth daily. 10/02/19   Cirigliano, Vito V, DO  glipiZIDE (GLUCOTROL) 10 MG tablet Take 10 mg by mouth 2 (two) times daily.    [provider]  lisinopril (PRINIVIL,ZESTRIL) 20 MG tablet Take 20 mg by mouth 2 (two) times daily.    [provider]  metFORMIN (GLUCOPHAGE) 1000 MG tablet Take 1,000 mg by mouth 2 (two) times daily with a meal.    [provider]  pantoprazole (PROTONIX) 40 MG tablet Take 1 tablet (40 mg total) by mouth 2 (two) times daily. 03/31/19   Cirigliano, Vito V, DO  propranolol (INDERAL) 40 MG tablet Take 40 mg by mouth 2 (two) times daily.     [provider]  spironolactone (ALDACTONE) 25 MG tablet TAKE 1 TABLET BY MOUTH DAILY 12/18/19   Cirigliano, Dominic Pea, DO     Family History  Problem Relation Age of Onset  . Diabetes Other   . Esophageal cancer Father   . Colon cancer Neg Hx   . Rectal cancer Neg Hx   . Stomach cancer Neg Hx     Social History   Socioeconomic History  . Marital status: Married    Spouse name: Not on file  . Number of children: 2  . Years of education: Not on file  . Highest education level: Not on file  Occupational History  . Not on file  Tobacco Use  . Smoking status: Never Smoker  . Smokeless tobacco: Never Used  Substance and Sexual Activity  . Alcohol use: Never  . Drug use: Never  . Sexual activity: Not on file  Other Topics Concern  . Not on file  Social History Narrative  . Not on file   Social Determinants of Health   Financial Resource Strain:   . Difficulty of Paying Living Expenses: Not on file  Food Insecurity:   . Worried About Charity fundraiser in the Last Year: Not on file  . Ran Out of Food in the Last Year: Not on file  Transportation Needs:   . Lack of  Transportation (Medical): Not on file  . Lack of Transportation (Non-Medical): Not on file  Physical Activity:   . Days of Exercise per Week: Not on file  . Minutes of Exercise per Session: Not on file  Stress:   . Feeling of Stress : Not on file  Social Connections:   . Frequency of Communication with Friends and Family: Not on file  . Frequency of Social Gatherings with Friends and Family: Not on file  . Attends Religious Services: Not on file  . Active Member of Clubs or Organizations: Not on file  . Attends Archivist Meetings: Not on file  . Marital Status: Not on file    Review of Systems  Review of Systems: A 12 point ROS discussed and pertinent positives are indicated in the HPI above.  All other systems are negative.  Physical Exam No direct physical exam was performed (except for noted visual exam findings with Video Visits).   Vital Signs: There were no vitals taken for this visit.  Imaging: MR LIVER W WO CONTRAST  Result  Date: 11/29/2019 CLINICAL DATA:  [name] cirrhosis EXAM: MRI ABDOMEN WITHOUT AND WITH CONTRAST TECHNIQUE: Multiplanar multisequence MR imaging of the abdomen was performed both before and after the administration of intravenous contrast. CONTRAST:  56m GADAVIST GADOBUTROL 1 MMOL/ML IV SOLN COMPARISON:  CT 07/29/2017, MRI 05/29/2019 FINDINGS: Lower chest:  Lung bases are clear. Hepatobiliary: Liver has a shrunken nodular contour. No intrahepatic biliary duct dilatation. There is marked increase in ascitic fluid surrounding the LEFT and RIGHT hepatic lobe. Fluid measures 4.8 cm in depth adjacent to the RIGHT hepatic lobe compared to 1.4 cm MRI 05/29/2019. Linear high T2 signal and linear enhancement liver consistent with fibrosis. No discrete enhancing lesions to suggest hepatoma The gallbladder is distended to 6 cm. There is sludge layering in the gallbladder. No extrahepatic biliary duct dilatation Pancreas: Normal pancreatic parenchymal intensity.  No ductal dilatation or inflammation. Spleen: Spleen is enlarged measuring 15.5 x 14.5 x 7.2 cm (volume = 850 cm^3). Splenic veins and portal veins are patent. Venous collateralization noted inferior to the spleen. Adrenals/urinary tract: Adrenal glands and kidneys are normal. Stomach/Bowel: Stomach and limited of the small bowel is unremarkable Vascular/Lymphatic: Abdominal aortic normal caliber. No retroperitoneal periportal lymphadenopathy. Musculoskeletal: No aggressive osseous lesion IMPRESSION: 1. Significant interval increase in ascites. 2. Cirrhotic morphology of liver WITHOUT enhancing lesion to suggest hepatoma. 3. Gallbladder distension. No evidence acute cholecystitis. No intra or extrahepatic biliary duct dilatation. 4. Normal pancreas. 5. Splenomegaly consistent with portal hypertension Electronically Signed   By: SSuzy BouchardM.D.   On: 11/29/2019 14:32    Labs:  CBC: Recent Labs    05/24/19 1514 07/21/19 1953 07/22/19 0334 10/02/19 1503  WBC 5.2 5.3 4.2 7.0  HGB 13.0 12.5* 11.6* 11.8*  HCT 38.4* 37.7* 35.0* 35.2*  PLT 74.0* 59* 47* 151.0    COAGS: Recent Labs    01/03/19 1021 05/24/19 1514 10/02/19 1503 10/13/19 1411  INR 1.18 1.2* 1.3* 1.3*    BMP: Recent Labs    01/03/19 1021 07/21/19 1953 07/22/19 0334 10/02/19 1503 10/31/19 1255 11/08/19 1456  NA 140 141 140 136 139 137  K 4.0 4.0 3.8 4.3 4.4 4.4  CL 108 105 107 102 103 103  CO2 24 23 19* 25 26 24   GLUCOSE 121* 87 63* 118* 132* 75  BUN 14 17 16 16 23 23   CALCIUM 9.1 9.4 8.7* 9.2 9.1 8.8  CREATININE 0.70 0.86 0.79 0.91 1.24 1.41  GFRNONAA >60 >60 >60  --   --   --   GFRAA >60 >60 >60  --   --   --     LIVER FUNCTION TESTS: Recent Labs    05/24/19 1514 07/22/19 0334 10/02/19 1503  BILITOT 1.0 1.7* 1.3*  AST 33 38 30  ALT 33 31 23  ALKPHOS 69 52 95  PROT 7.3 6.9 7.3  ALBUMIN 4.2 3.7 3.8    TUMOR MARKERS: No results for input(s): AFPTM, CEA, CA199, CHROMGRNA in the last 8760  hours.  Assessment and Plan:  Mr. 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  He is currently on diuretic therapy consisting of Lasix 25 mg and Aldactone 25 mg daily.  He required large-volume paracentesis on 10/05/2019 with removal of 5 L followed by LVP on 10/24/2019 with removal of 7 L and again on 11/16/2019 with removal of an additional 7 L of ascites.  He has not  required paracentesis over the past month which represents a significant improvement compared to the prior 6 weeks.  We spent a significant amount of time discussing the pathology of cirrhosis, portal hypertension and ascites with a brief discussion on esophageal varices as well.  I described how the TIPS procedure works and how it decreases the portal pressure reducing the risk for future bleeding from esophageal varices as well as decreasing the portal pressure and reducing accumulation of ascites.  He understands that the risks include hepatic encephalopathy, right-sided heart failure and decompensation of his underlying liver disease.  He is at relatively low risk for hepatic encephalopathy having never suffered symptoms in the past.  He does not carry a known diagnosis of pulmonary arterial hypertension or right-sided heart failure but would benefit from an echocardiogram to confirm normal heart function.  Finally, his most recent meld was 26 which is relatively low risk for TIPS, however his labs are somewhat dated.  At this time, he remains interested in TIPS but is not ready to move forward with the procedure given that his diuretics seem to be working.  As such, we will undergo work-up for TIPS candidacy including labs and echocardiography and then revisit with Martin Strong in approximately 1 month.  He may be able to achieve sustained benefit with further up  titration of his diuretics.  1.)  Echocardiogram to assess for pulmonary arterial hypertension or right-sided heart dysfunction prior to TIPS. 2.)  Labs including CBC, INR and complete metabolic panel 3.)  Continue follow-up with Dr. Bryan Lemma -diuretics appear to be reducing ascites buildup.  Mild further up titration may obviate the need for serial large-volume paracentesis. 2.)  Follow-up appointment in 4-6 weeks to reassess.  If he continues to require large-volume paracentesis despite up titration of his diuretics and for work-up demonstrates no evidence of cardiac disease or progressive hepatic dysfunction, then he would be a candidate for TIPS.  Thank you for this interesting consult.  I greatly enjoyed meeting Martin Strong and look forward to participating in their care.  A copy of this report was sent to the requesting provider on this date.  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    Visit type: Audio only (telephone). Audio (no video) only due to patient preference. Alternative for in-person consultation at [facility], [address], [place], [place]. This visit type was conducted due to national recommendations for restrictions regarding the COVID-19 Pandemic (e.g. social distancing).  This format is felt to be most appropriate for this patient at this time.  All issues noted in this document were discussed and addressed.

## 2019-12-20 ENCOUNTER — Other Ambulatory Visit: Payer: Self-pay | Admitting: Interventional Radiology

## 2019-12-20 ENCOUNTER — Telehealth: Payer: Self-pay | Admitting: Gastroenterology

## 2019-12-20 DIAGNOSIS — Z01810 Encounter for preprocedural cardiovascular examination: Secondary | ICD-10-CM

## 2019-12-20 DIAGNOSIS — R188 Other ascites: Secondary | ICD-10-CM

## 2019-12-20 DIAGNOSIS — K7460 Unspecified cirrhosis of liver: Secondary | ICD-10-CM

## 2019-12-20 DIAGNOSIS — K7581 Nonalcoholic steatohepatitis (NASH): Secondary | ICD-10-CM

## 2019-12-20 NOTE — Telephone Encounter (Signed)
Please review and advise.

## 2019-12-21 ENCOUNTER — Other Ambulatory Visit: Payer: Self-pay

## 2019-12-21 NOTE — Telephone Encounter (Signed)
Please place referral to IR for paracentesis.  Goal to remove 7 L.  Infuse 12.5 g of 25% albumin for every 2 L removed. Send fluid for cell count, Gram stain, culture.  Additionally, he was seen by IR for TIPS evaluation.  Plan for ongoing medical management for now, with close follow-up in the IR clinic.  Since he is again requiring large-volume paracentesis, I suspect we will be heading towards TIPS in the very near future, particularly since he has not tolerated up titration of his diuretics due to renal injury.  Has been seen by Nephrology as well.  To follow-up with me as already scheduled on 01/01/2020 along with follow-up in the [facility] as scheduled.

## 2019-12-21 NOTE — Telephone Encounter (Signed)
Left message for patient to call back to the office; patient has been scheduled at [facility] for paracentesis on 12/22/2019 at 2:00 pm;

## 2019-12-22 ENCOUNTER — Ambulatory Visit (HOSPITAL_COMMUNITY): Payer: BC Managed Care – PPO

## 2019-12-25 ENCOUNTER — Ambulatory Visit (HOSPITAL_COMMUNITY)
Admission: RE | Admit: 2019-12-25 | Discharge: 2019-12-25 | Disposition: A | Discharge status: Home / Self Care | Payer: BC Managed Care – PPO | Source: Ambulatory Visit | Attending: Gastroenterology | Admitting: Gastroenterology

## 2019-12-25 ENCOUNTER — Other Ambulatory Visit: Payer: Self-pay

## 2019-12-25 DIAGNOSIS — K7581 Nonalcoholic steatohepatitis (NASH): Secondary | ICD-10-CM

## 2019-12-25 DIAGNOSIS — K7460 Unspecified cirrhosis of liver: Secondary | ICD-10-CM

## 2019-12-25 DIAGNOSIS — R188 Other ascites: Secondary | ICD-10-CM | POA: Insufficient documentation

## 2019-12-25 HISTORY — PX: IR PARACENTESIS: IMG2679

## 2019-12-25 LAB — BODY FLUID CELL COUNT WITH DIFFERENTIAL
Eos, Fluid: 0 %
Lymphs, Fluid: 38 %
Monocyte-Macrophage-Serous Fluid: 58 % (ref 50–90)
Neutrophil Count, Fluid: 4 % (ref 0–25)
Total Nucleated Cell Count, Fluid: 57 cu mm (ref 0–1000)

## 2019-12-25 LAB — GRAM STAIN: Gram Stain: NONE SEEN

## 2019-12-25 IMAGING — US IR PARACENTESIS
2 series · 5 of 5 positions shown · non-contrast
Comparison: none

INDICATION: Patient with history of NASH cirrhosis with recurrent ascites.
Request is made for diagnostic and therapeutic paracentesis up to 7

[Series 1: (id) · 3 of 3 slices shown]
[im 1/3]
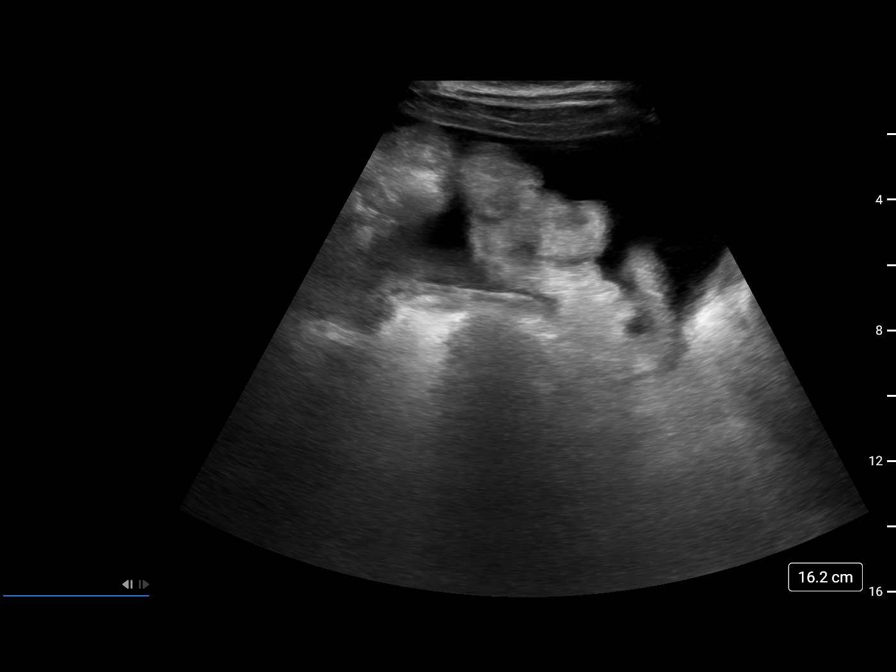
[im 2/3]
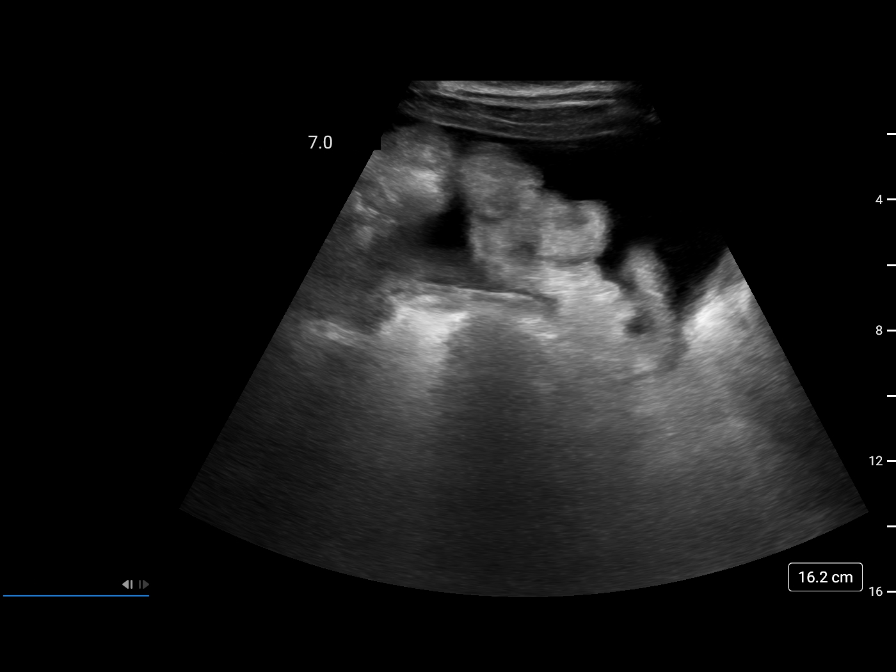
[im 3/3]
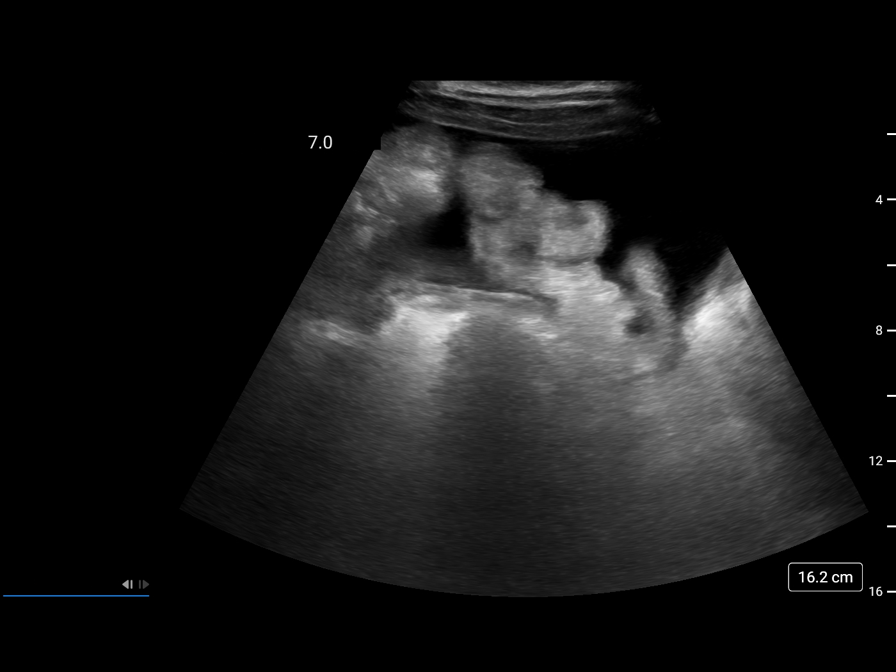

[Series 1: ir (id) (id)/(id)/(id) ir · 2 of 2 slices shown]
[im 1/2]
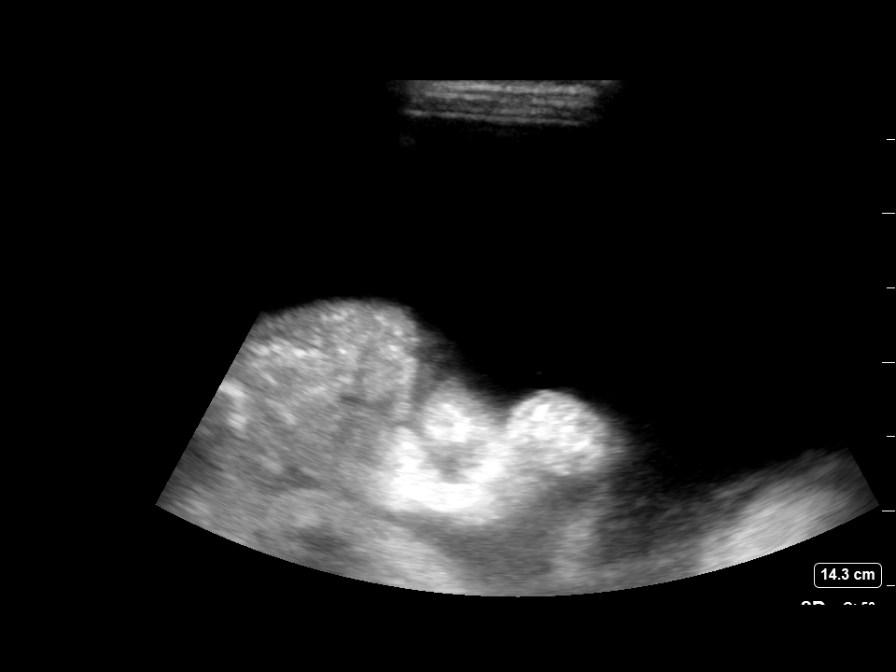
[im 2/2]
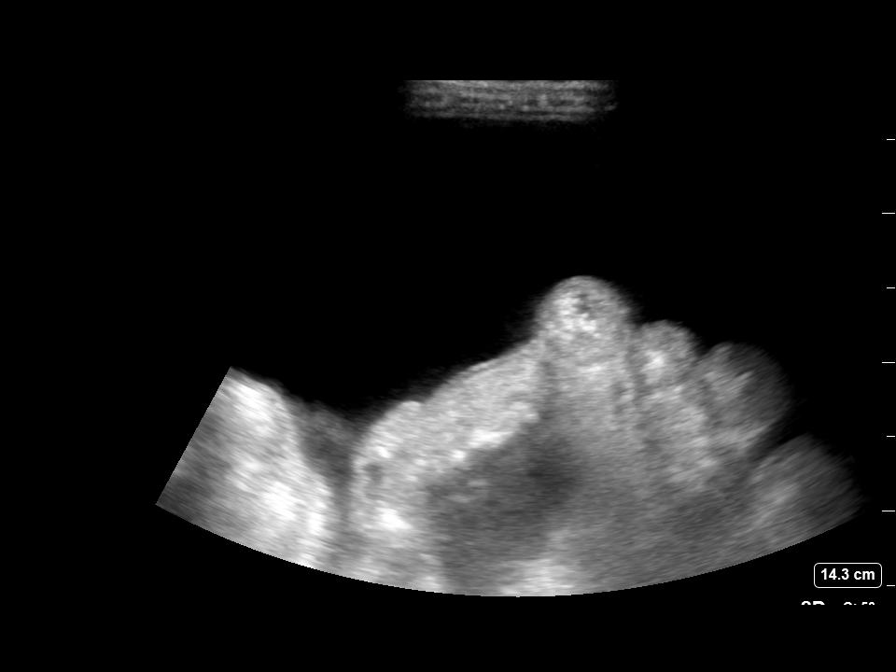

[5 of 5 positions shown; findings below may reference images not displayed]

EXAM:
ULTRASOUND GUIDED DIAGNOSTIC AND THERAPEUTIC PARACENTESIS

MEDICATIONS:
10 mL 1% lidocaine

COMPLICATIONS:
None immediate.

PROCEDURE:
Informed written consent was obtained from the patient after a
discussion of the risks, benefits and alternatives to treatment. A
timeout was performed prior to the initiation of the procedure.

Initial ultrasound scanning demonstrates a large amount of ascites
within the right lower abdominal quadrant. The right lower abdomen
was prepped and draped in the usual sterile fashion. 1% lidocaine
was used for local anesthesia.

Following this, a 19 gauge, 7-cm, Yueh catheter was introduced. An
ultrasound image was saved for documentation purposes. The
paracentesis was performed. The catheter was removed and a dressing
was applied. The patient tolerated the procedure well without
immediate post procedural complication.
Patient received post-procedure intravenous albumin; see nursing
notes for details.
FINDINGS: A total of approximately 7.0 L of clear yellow fluid was removed.
Samples were sent to the laboratory as requested by the clinical
team.
IMPRESSION: Successful ultrasound-guided paracentesis yielding 7.0 L of
peritoneal fluid.

## 2019-12-25 MED ORDER — LIDOCAINE HCL 1 % IJ SOLN
INTRAMUSCULAR | Status: AC
  Filled 2019-12-25: qty 20

## 2019-12-25 MED ORDER — ALBUMIN HUMAN 25 % IV SOLN
INTRAVENOUS | Status: AC
  Filled 2019-12-25: qty 200

## 2019-12-25 MED ORDER — ALBUMIN HUMAN 25 % IV SOLN
50.0000 g | Freq: Once | INTRAVENOUS | Status: AC
  Administered 2019-12-25: 50 g via INTRAVENOUS

## 2019-12-25 MED ORDER — LIDOCAINE HCL 1 % IJ SOLN
INTRAMUSCULAR | Status: DC | PRN
  Administered 2019-12-25: 10 mL

## 2019-12-25 NOTE — Procedures (Signed)
PROCEDURE SUMMARY:  Successful image-guided paracentesis from the right lateral abdomen.  Yielded 7.0 liters of clear yellow fluid.  No immediate complications.  EBL = 0 mL. Patient tolerated well.   Specimen was sent for labs.  Please see imaging section of Epic for full dictation.   Claris Pong Patt Steinhardt PA-C 12/25/2019 9:31 AM

## 2019-12-26 ENCOUNTER — Other Ambulatory Visit (HOSPITAL_COMMUNITY): Payer: BC Managed Care – PPO

## 2019-12-26 LAB — PATHOLOGIST SMEAR REVIEW

## 2019-12-27 ENCOUNTER — Other Ambulatory Visit: Payer: Self-pay | Admitting: Gastroenterology

## 2019-12-27 MED ORDER — SPIRONOLACTONE 25 MG PO TABS: 25.0000 mg | ORAL_TABLET | Freq: Every day | ORAL | 0 refills | Status: DC

## 2019-12-27 MED ORDER — FUROSEMIDE 20 MG PO TABS: 20.0000 mg | ORAL_TABLET | Freq: Every day | ORAL | 0 refills | Status: DC

## 2019-12-27 NOTE — Telephone Encounter (Signed)
Lasix and Aldactone refilled. See initial note.

## 2019-12-27 NOTE — Telephone Encounter (Signed)
Patient is returning your call.  

## 2019-12-27 NOTE — Telephone Encounter (Signed)
Spoke with this patient concerning the lab results-patient needs refill on Lasix and Aldactone-please assist in this Thank you

## 2019-12-30 LAB — CULTURE, BODY FLUID W GRAM STAIN -BOTTLE: Culture: NO GROWTH

## 2020-01-01 ENCOUNTER — Ambulatory Visit (INDEPENDENT_AMBULATORY_CARE_PROVIDER_SITE_OTHER): Payer: BC Managed Care – PPO | Admitting: Gastroenterology

## 2020-01-01 ENCOUNTER — Encounter: Payer: Self-pay | Admitting: Gastroenterology

## 2020-01-01 ENCOUNTER — Other Ambulatory Visit: Payer: Self-pay

## 2020-01-01 VITALS — BP 98/54 | HR 93 | Temp 98.4°F | Ht 73.0 in | Wt 236.0 lb

## 2020-01-01 DIAGNOSIS — K7460 Unspecified cirrhosis of liver: Secondary | ICD-10-CM | POA: Diagnosis not present

## 2020-01-01 DIAGNOSIS — I8500 Esophageal varices without bleeding: Secondary | ICD-10-CM

## 2020-01-01 DIAGNOSIS — K7581 Nonalcoholic steatohepatitis (NASH): Secondary | ICD-10-CM

## 2020-01-01 DIAGNOSIS — K3189 Other diseases of stomach and duodenum: Secondary | ICD-10-CM

## 2020-01-01 DIAGNOSIS — R1310 Dysphagia, unspecified: Secondary | ICD-10-CM

## 2020-01-01 DIAGNOSIS — K766 Portal hypertension: Secondary | ICD-10-CM

## 2020-01-01 DIAGNOSIS — R188 Other ascites: Secondary | ICD-10-CM | POA: Diagnosis not present

## 2020-01-01 DIAGNOSIS — D509 Iron deficiency anemia, unspecified: Secondary | ICD-10-CM

## 2020-01-01 NOTE — Progress Notes (Signed)
P  Chief Complaint:    Cirrhosis, ascites  GI History: 54 year old male with a history ofNASHcirrhosis diagnosed in February 2017 with history of variceal bleeding, admitted to [facility] in October 2019 with recurrence of variceal bleeding, treated with EGD with EVL x2 bands and cessation of bleeding. [place] [facility] [place]. Cirrhosis complicated by esophageal varices with bleeding requiring multiple EGDs with banding in the past along with propranolol for dual prophylaxis.  Developed large volume ascites in 09/2019.  Diuretic intolerant, and has required serial large-volume paracentesis and low-sodium diet with low-dose diuretics.  Otherwise no history ofhepatic encephalopathy. Also follows with Roosevelt Locks at [facility].  Evaluated by Dr. Laurence Ferrari in IR for possible TIPS.  Cirrhosis Hx: - Etiology: NASH - Diagnosed 2017 - Liver PZ:WCHE - Complications: EV with admission for bleeding 09/2018 treated with EGD with EVL, large volume ascites serial LVP starting 09/2019  - Rx: Propranolol (dual prophy), Lasix, Aldactone - HCC Screening: MRI 11/2019: Ascites, no HCC.  AFP <0.9. -Annual lab check: Completed 05/2019-mild iron deficiency. Takes ferrous sulfate -HAV and HBV vaccine series started 05/2019 - MELD:14 - Child Pugh: B (7 pts)  Endoscopic history: -EGD x3 in 2017 in [place], [place] for esophageal varices with banding -EGD (09/2018, Dr. Bryan Lemma): Grade 2 esophageal varices with stigmata of recent bleeding requiring banding x2 with complete eradication, portal hypertensive gastropathy without bleeding. - EGD (12/2018, Dr. Bryan Lemma): Grade II esophageal varices, 6 bands placed with deflation. Normal Z line at 46 cm, severe portal HTN gastropathy with contact oozing, small GOV1, normal duodenum. H pylori serologies negative -EGD (02/2019, Dr. Bryan Lemma): Grade 2 esophageal varices, 4 bands placed with complete  eradication.Portal hypertensive gastropathy/2 adenopathy -EGD (07/2019, Dr. Bryan Lemma): Done for dysphagia. grade 1 esophageal varices, multiple post variceal banding scars in the lower third with 1 area of luminal deformity, likely consistent with prior deep banding site. The lumen was mildly narrowed, but easily traversed and no additional endoscopic dilation performed. Mild esophagitis at the GEJ, portal hypertensive gastropathy, peptic antritis/duodenitis. Papilla prominent.  HPI:    Patient is a 54 y.o. male presenting to the [facility]ology [facility] for follow-up.  Last seen by me in 10/2019, and has subsequently been seen by Roosevelt Locks at [facility], and Dr. Laurence Ferrari with IR for possible TIPS evaluation.  Additionally, has an appointment with Nephrology on 01/12/2020 for evaluation of diuretic intolerant ascites.  TTE scheduled for next week as part of possible TIPS work-up, then to have follow-up with IR following that.  Paracentesis on 12/25/2019 with 7L removed and good clinical improvement.  Was doing well, but fluid started reaccumulating over the last couple of days.  Grandson accidentally fell on the right side of his abdomen while he was sleeping.  No residual pain.  No ecchymosis.  Tolerating p.o. intake without issue.  Still working nights.  He is concerned about increasing frequency of requirement for LVP.  Maintaining low-sodium diet.   Review of systems:     No chest pain, no SOB, no fevers, no urinary sx   Past Medical History:  Diagnosis Date  . Anemia   . Arthritis   . Cirrhosis ([place])   . Diabetes mellitus without complication ([place])   . Fatty liver   . GERD (gastroesophageal reflux disease)   . GI bleed   . Hyperlipidemia   . Hypertension     Patient's surgical history, family medical history, social history, medications and allergies were all reviewed in Epic  Current Outpatient Medications  Medication Sig Dispense Refill  . cholecalciferol  (VITAMIN D) 1000 units tablet Take 1,000 Units by mouth 2 (two) times daily.    . dapagliflozin propanediol (FARXIGA) 10 MG TABS tablet Take 10 mg by mouth every evening.     . ferrous sulfate (KP FERROUS SULFATE) 325 (65 FE) MG tablet Take 1 tablet (325 mg total) by mouth daily with breakfast. (Patient taking differently: Take 325 mg by mouth 2 (two) times daily. ) 30 tablet 3  . furosemide (LASIX) 20 MG tablet Take 1 tablet (20 mg total) by mouth daily. 90 tablet 0  . glipiZIDE (GLUCOTROL) 10 MG tablet Take 10 mg by mouth 2 (two) times daily.    Marland Kitchen lisinopril (PRINIVIL,ZESTRIL) 20 MG tablet Take 20 mg by mouth 2 (two) times daily.    . metFORMIN (GLUCOPHAGE) 1000 MG tablet Take 1,000 mg by mouth 2 (two) times daily with a meal.    . pantoprazole (PROTONIX) 40 MG tablet Take 1 tablet (40 mg total) by mouth 2 (two) times daily. 60 tablet 3  . propranolol (INDERAL) 40 MG tablet Take 40 mg by mouth 2 (two) times daily.     Marland Kitchen spironolactone (ALDACTONE) 25 MG tablet Take 1 tablet (25 mg total) by mouth daily. 30 tablet 0   No current facility-administered medications for this visit.    Physical Exam:     BP (!) 98/54   Pulse 93   Temp 98.4 F (36.9 C)   Ht 6' 1"  (1.854 m)   Wt 236 lb (107 kg)   BMI 31.14 kg/m   GENERAL:  Pleasant male in NAD PSYCH: : Cooperative, normal affect EENT:  conjunctiva pink, mucous membranes moist, neck supple without masses CARDIAC:  RRR, no murmur heard, no peripheral edema PULM: Normal respiratory effort, lungs CTA bilaterally, no wheezing ABDOMEN: Distended, fluid-filled abdomen.  nontender. No obvious masses, no hepatomegaly,  normal bowel sounds SKIN:  turgor, no lesions seen Musculoskeletal:  Normal muscle tone, normal strength NEURO: Alert and oriented x 3, no focal neurologic deficits   IMPRESSION and PLAN:    1) NASH Cirrhosis 2) Ascites 3) Diuretic intolerant      History ofNASH cirrhosis with decompensation as manifested by bleeding  esophageal varices, and large volume ascites.  Unfortunately, ascites has been diuretic intolerant, with repeated renal injury with any slight up titration of diuretics.  Maintaining very low dose Lasix/Aldactone currently.  Otherwise maintaining low-sodium diet, but continues to reaccumulate with increasing frequency.  Paracentesis on 12/25/2019 with 7L removed, but by exam today, needs another short interval LVP.  -Arrange for repeat LVP.  Can remove up to 8L.  Infuse albumin 25% 12.5 g for every 2L removed -Repeat BMP with next lab draw, along with INR, CBC, LAE -Has appointment with Nephrology later this month -Scheduled for TTE next week as part of TIPS work-up -To follow-up with IR following TTE.  I suspect he would benefit from TIPS from not only a refractory ascites standpoint, but also history of Esophageal Varices -Resume Lasix/Aldactone at current dose -Resume low-sodium diet -Completing viral hepatitis vaccine series as scheduled -Otherwise UTD on vaccine series and labs -Follow-up with [facility] in another 2 months as previously recommended -UTD on hepatoma screening -Follow-up in 1 month -MELD 14, Child B  4) Esophageal varices: 5) Portal hypertensive gastropathy -Resume propranolol for dual prophylaxis given history of bleed -Resume PPI for both reflux and portal hypertensive gastritis -If no TIPS, repeat EGD in 1 year for  EV surveillance.  If he does receive TIPS, obviates need for ongoing EGD for surveillance  6)Mild Iron reduction: Mildly reduced iron indicesin 2019. Had been taking oral iron therapy and has continued to take without any medication ADRs,with improvement inH/H at11.8/35.2 (from 7.7/23).  - Resume PO iron -Repeatiron indicesin 3 to 6 months -Repeat CBC with next lab draw/follow-up  7) Dysphagia: Resolved. There is a 2 to 3% incidence of dysphagia following EVL. Monitor for symptom recurrence  8) GERD: -Well controlled with  PPI therapy -Resume PPI -Resume antireflux lifestyle modifications  I spent 33 minutes of time, including in depth chart review, independent review of results as outlined above, communicating results with the patient directly, face-to-face time with the patient, coordinating care, and ordering studies and medications as appropriate, and documentation.   Lavena Bullion ,DO, FACG 01/01/2020, 11:49 AM

## 2020-01-01 NOTE — Patient Instructions (Addendum)
You have been scheduled for an abdominal paracentesis at [facility] radiology (1st floor of hospital) on 01/05/20 at 9:am. Please arrive at least 15 minutes prior to your appointment time for registration. Should you need to reschedule this appointment for any reason, please call our office at [phone].   It was a pleasure to see you today!  Vito Cirigliano, D.O.

## 2020-01-05 ENCOUNTER — Ambulatory Visit (HOSPITAL_COMMUNITY)
Admission: RE | Admit: 2020-01-05 | Discharge: 2020-01-05 | Disposition: A | Discharge status: Home / Self Care | Payer: BC Managed Care – PPO | Source: Ambulatory Visit | Attending: Gastroenterology | Admitting: Gastroenterology

## 2020-01-05 ENCOUNTER — Other Ambulatory Visit: Payer: Self-pay

## 2020-01-05 DIAGNOSIS — K7460 Unspecified cirrhosis of liver: Secondary | ICD-10-CM | POA: Diagnosis not present

## 2020-01-05 DIAGNOSIS — R188 Other ascites: Secondary | ICD-10-CM

## 2020-01-05 HISTORY — PX: IR PARACENTESIS: IMG2679

## 2020-01-05 LAB — BODY FLUID CELL COUNT WITH DIFFERENTIAL
Eos, Fluid: 0 %
Lymphs, Fluid: 54 %
Monocyte-Macrophage-Serous Fluid: 38 % — ABNORMAL LOW (ref 50–90)
Neutrophil Count, Fluid: 8 % (ref 0–25)
Total Nucleated Cell Count, Fluid: 132 cu mm (ref 0–1000)

## 2020-01-05 IMAGING — US IR PARACENTESIS
1 series · 2 of 2 positions shown · non-contrast
Comparison: none

INDICATION: Patient with abdominal pain, recurrent ascites. Request is made for
therapeutic paracentesis of up to 10 L maximum.

[Series 1: (id) · 2 of 2 slices shown]
[im 1/2]
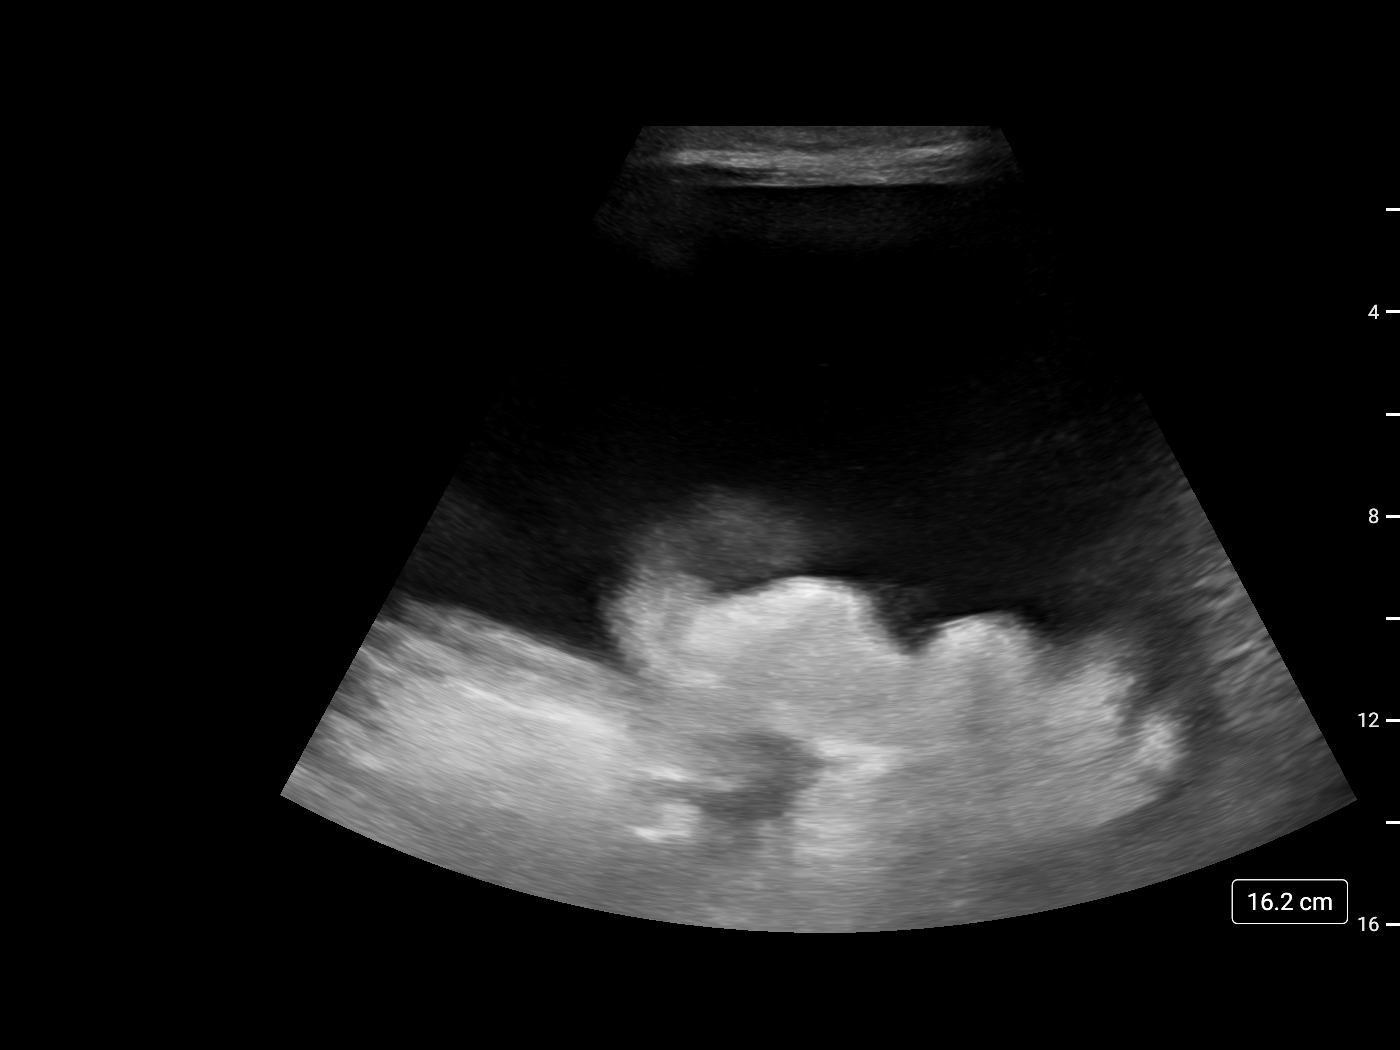
[im 2/2]
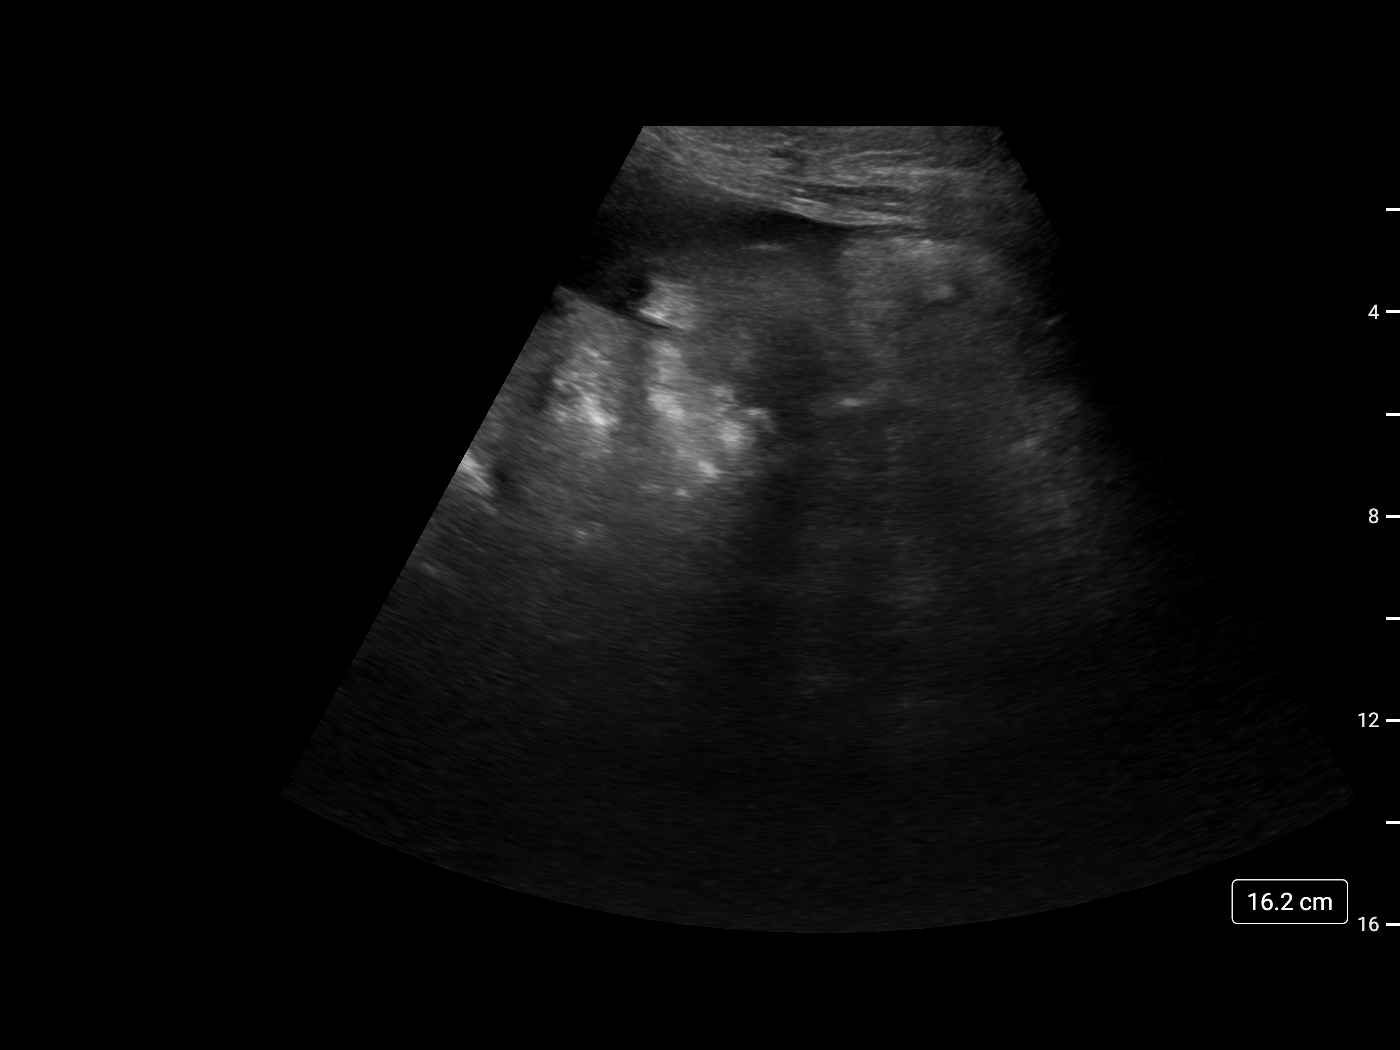

[2 of 2 positions shown; findings below may reference images not displayed]

EXAM:
ULTRASOUND GUIDED THERAPEUTIC PARACENTESIS

MEDICATIONS:
10 mL 1% lidocaine

COMPLICATIONS:
None immediate.

PROCEDURE:
Informed written consent was obtained from the patient after a
discussion of the risks, benefits and alternatives to treatment. A
timeout was performed prior to the initiation of the procedure.

Initial ultrasound scanning demonstrates a large amount of ascites
within the right lower abdominal quadrant. The right lower abdomen
was prepped and draped in the usual sterile fashion. 1% lidocaine
was used for local anesthesia.

Following this, a 19 gauge, 7-cm, Yueh catheter was introduced. An
ultrasound image was saved for documentation purposes. The
paracentesis was performed. The catheter was removed and a dressing
was applied. The patient tolerated the procedure well without
immediate post procedural complication.
Patient received post-procedure intravenous albumin; see nursing
notes for details.
FINDINGS: A total of approximately 10.0 liters of yellow fluid was removed.
Samples were sent to the laboratory as requested by the clinical
team.
IMPRESSION: Successful ultrasound-guided therapeutic paracentesis yielding
liters of peritoneal fluid.

## 2020-01-05 MED ORDER — LIDOCAINE HCL 1 % IJ SOLN
INTRAMUSCULAR | Status: DC | PRN
  Administered 2020-01-05: 10 mL

## 2020-01-05 MED ORDER — ALBUMIN HUMAN 25 % IV SOLN
50.0000 g | Freq: Once | INTRAVENOUS | Status: AC
  Administered 2020-01-05: 50 g via INTRAVENOUS
  Filled 2020-01-05: qty 200

## 2020-01-05 MED ORDER — LIDOCAINE HCL 1 % IJ SOLN
INTRAMUSCULAR | Status: AC
  Filled 2020-01-05: qty 20

## 2020-01-05 MED ORDER — ALBUMIN HUMAN 25 % IV SOLN
INTRAVENOUS | Status: AC
  Filled 2020-01-05: qty 200

## 2020-01-05 NOTE — Procedures (Signed)
PROCEDURE SUMMARY:  Successful US guided paracentesis from right lateral abdomen.  Yielded 10.0 L of yellow fluid.  No immediate complications.  Pt tolerated well.   Specimen was sent for labs.  EBL < 5m  Patient received albumin.   KDocia BarrierPA-C 01/05/2020 10:06 AM

## 2020-01-08 ENCOUNTER — Inpatient Hospital Stay (HOSPITAL_COMMUNITY): Admission: RE | Admit: 2020-01-08 | Payer: BC Managed Care – PPO | Source: Ambulatory Visit

## 2020-01-08 LAB — PATHOLOGIST SMEAR REVIEW: Path Review: REACTIVE

## 2020-01-10 LAB — CULTURE, BODY FLUID W GRAM STAIN -BOTTLE: Culture: NO GROWTH

## 2020-01-13 LAB — GRAM STAIN: Gram Stain: NONE SEEN

## 2020-01-19 ENCOUNTER — Other Ambulatory Visit: Payer: Self-pay

## 2020-01-19 ENCOUNTER — Telehealth: Payer: Self-pay | Admitting: Gastroenterology

## 2020-01-19 ENCOUNTER — Ambulatory Visit (HOSPITAL_COMMUNITY)
Admission: RE | Admit: 2020-01-19 | Discharge: 2020-01-19 | Disposition: A | Discharge status: Home / Self Care | Payer: BC Managed Care – PPO | Source: Ambulatory Visit | Attending: Interventional Radiology | Admitting: Interventional Radiology

## 2020-01-19 DIAGNOSIS — I358 Other nonrheumatic aortic valve disorders: Secondary | ICD-10-CM | POA: Insufficient documentation

## 2020-01-19 DIAGNOSIS — K7460 Unspecified cirrhosis of liver: Secondary | ICD-10-CM | POA: Insufficient documentation

## 2020-01-19 DIAGNOSIS — I10 Essential (primary) hypertension: Secondary | ICD-10-CM | POA: Diagnosis not present

## 2020-01-19 DIAGNOSIS — J90 Pleural effusion, not elsewhere classified: Secondary | ICD-10-CM | POA: Insufficient documentation

## 2020-01-19 DIAGNOSIS — Z01810 Encounter for preprocedural cardiovascular examination: Secondary | ICD-10-CM | POA: Diagnosis not present

## 2020-01-19 DIAGNOSIS — E785 Hyperlipidemia, unspecified: Secondary | ICD-10-CM | POA: Diagnosis not present

## 2020-01-19 DIAGNOSIS — E119 Type 2 diabetes mellitus without complications: Secondary | ICD-10-CM | POA: Insufficient documentation

## 2020-01-19 DIAGNOSIS — K219 Gastro-esophageal reflux disease without esophagitis: Secondary | ICD-10-CM | POA: Insufficient documentation

## 2020-01-19 NOTE — Telephone Encounter (Signed)
Left message for patient to call back to the office;    Patient has been scheduled for IR para at [facility] on 01/24/2020 at 10:00 am; patient is to arrive at 9:45 am;  Orders have been placed in Epic for IR para with lab orders and Albumin;

## 2020-01-19 NOTE — Telephone Encounter (Signed)
Paracentesis orders?

## 2020-01-19 NOTE — Telephone Encounter (Signed)
Paracentesis performed on 01/05/2020 with 10 L removed.  Per patient, ascites has again reaccumulated.  Plan for the following: Please place referral to IR for paracentesis.  Goal to remove 10L.  Infuse 12.5 g of 25% albumin for every 2 L removed. Send fluid for cell count, Gram stain, culture.  Looks like TTE was completed earlier today.  Please ask patient if he has follow-up scheduled with IR to discuss TIPS evaluation again.  Given his increasing need for recurrent large-volume paracentesis, along with previous intolerance to up titration of diuretics, suspect TIPS would be quite beneficial.  He has been seen by Nephrology.  Can you please obtain a copy of that clinic note.  Otherwise, to follow-up with me and [facility] as previously discussed.

## 2020-01-19 NOTE — Telephone Encounter (Signed)
Patient returned your call provided him with the date and time for his appt at [facility] patient understood.

## 2020-01-19 NOTE — Progress Notes (Signed)
  Echocardiogram 2D Echocardiogram has been performed.  Martin Strong 01/19/2020, 12:22 PM

## 2020-01-22 NOTE — Telephone Encounter (Signed)
Left message for patient to call back to the office;  

## 2020-01-22 NOTE — Telephone Encounter (Signed)
Patient returned call to the office- patient is agreeable to plan of care and will have paracentesis on 02/10/20021 as scheduled;

## 2020-01-23 ENCOUNTER — Other Ambulatory Visit: Payer: Self-pay

## 2020-01-23 ENCOUNTER — Telehealth: Payer: Self-pay | Admitting: Gastroenterology

## 2020-01-23 ENCOUNTER — Emergency Department (HOSPITAL_COMMUNITY): Payer: BC Managed Care – PPO

## 2020-01-23 ENCOUNTER — Inpatient Hospital Stay (HOSPITAL_COMMUNITY)
Admission: EM | Admit: 2020-01-23 | Discharge: 2020-01-27 | DRG: 372 | Disposition: A | Discharge status: Home / Self Care | Payer: BC Managed Care – PPO | Attending: Internal Medicine | Admitting: Internal Medicine

## 2020-01-23 ENCOUNTER — Ambulatory Visit (HOSPITAL_COMMUNITY)
Admission: RE | Admit: 2020-01-23 | Discharge: 2020-01-23 | Disposition: A | Discharge status: Home / Self Care | Payer: BC Managed Care – PPO | Source: Ambulatory Visit | Attending: Gastroenterology | Admitting: Gastroenterology

## 2020-01-23 DIAGNOSIS — E785 Hyperlipidemia, unspecified: Secondary | ICD-10-CM | POA: Diagnosis present

## 2020-01-23 DIAGNOSIS — Z20822 Contact with and (suspected) exposure to covid-19: Secondary | ICD-10-CM | POA: Diagnosis present

## 2020-01-23 DIAGNOSIS — E11649 Type 2 diabetes mellitus with hypoglycemia without coma: Secondary | ICD-10-CM | POA: Diagnosis not present

## 2020-01-23 DIAGNOSIS — R600 Localized edema: Secondary | ICD-10-CM

## 2020-01-23 DIAGNOSIS — K3189 Other diseases of stomach and duodenum: Secondary | ICD-10-CM | POA: Diagnosis present

## 2020-01-23 DIAGNOSIS — K7290 Hepatic failure, unspecified without coma: Secondary | ICD-10-CM | POA: Diagnosis present

## 2020-01-23 DIAGNOSIS — K652 Spontaneous bacterial peritonitis: Principal | ICD-10-CM | POA: Diagnosis present

## 2020-01-23 DIAGNOSIS — R188 Other ascites: Secondary | ICD-10-CM | POA: Diagnosis present

## 2020-01-23 DIAGNOSIS — K7460 Unspecified cirrhosis of liver: Secondary | ICD-10-CM | POA: Diagnosis present

## 2020-01-23 DIAGNOSIS — K766 Portal hypertension: Secondary | ICD-10-CM | POA: Diagnosis present

## 2020-01-23 DIAGNOSIS — Z888 Allergy status to other drugs, medicaments and biological substances status: Secondary | ICD-10-CM

## 2020-01-23 DIAGNOSIS — K2970 Gastritis, unspecified, without bleeding: Secondary | ICD-10-CM | POA: Diagnosis present

## 2020-01-23 DIAGNOSIS — Z7984 Long term (current) use of oral hypoglycemic drugs: Secondary | ICD-10-CM

## 2020-01-23 DIAGNOSIS — E119 Type 2 diabetes mellitus without complications: Secondary | ICD-10-CM

## 2020-01-23 DIAGNOSIS — Z79899 Other long term (current) drug therapy: Secondary | ICD-10-CM

## 2020-01-23 DIAGNOSIS — I8500 Esophageal varices without bleeding: Secondary | ICD-10-CM | POA: Diagnosis present

## 2020-01-23 DIAGNOSIS — D61818 Other pancytopenia: Secondary | ICD-10-CM | POA: Diagnosis present

## 2020-01-23 DIAGNOSIS — Z833 Family history of diabetes mellitus: Secondary | ICD-10-CM

## 2020-01-23 DIAGNOSIS — I959 Hypotension, unspecified: Secondary | ICD-10-CM | POA: Diagnosis not present

## 2020-01-23 DIAGNOSIS — N179 Acute kidney failure, unspecified: Secondary | ICD-10-CM | POA: Diagnosis present

## 2020-01-23 DIAGNOSIS — I8510 Secondary esophageal varices without bleeding: Secondary | ICD-10-CM | POA: Diagnosis present

## 2020-01-23 DIAGNOSIS — B954 Other streptococcus as the cause of diseases classified elsewhere: Secondary | ICD-10-CM | POA: Diagnosis present

## 2020-01-23 DIAGNOSIS — R1310 Dysphagia, unspecified: Secondary | ICD-10-CM | POA: Diagnosis present

## 2020-01-23 DIAGNOSIS — I10 Essential (primary) hypertension: Secondary | ICD-10-CM | POA: Diagnosis present

## 2020-01-23 DIAGNOSIS — D649 Anemia, unspecified: Secondary | ICD-10-CM

## 2020-01-23 DIAGNOSIS — K219 Gastro-esophageal reflux disease without esophagitis: Secondary | ICD-10-CM | POA: Diagnosis present

## 2020-01-23 DIAGNOSIS — K7581 Nonalcoholic steatohepatitis (NASH): Secondary | ICD-10-CM

## 2020-01-23 HISTORY — PX: IR PARACENTESIS: IMG2679

## 2020-01-23 LAB — BODY FLUID CELL COUNT WITH DIFFERENTIAL
Eos, Fluid: 0 %
Lymphs, Fluid: 2 %
Monocyte-Macrophage-Serous Fluid: 21 % — ABNORMAL LOW (ref 50–90)
Neutrophil Count, Fluid: 77 % — ABNORMAL HIGH (ref 0–25)
Total Nucleated Cell Count, Fluid: 2480 cu mm — ABNORMAL HIGH (ref 0–1000)

## 2020-01-23 LAB — CBC WITH DIFFERENTIAL/PLATELET
Abs Immature Granulocytes: 0.03 10*3/uL (ref 0.00–0.07)
Basophils Absolute: 0 10*3/uL (ref 0.0–0.1)
Basophils Relative: 0 %
Eosinophils Absolute: 0.1 10*3/uL (ref 0.0–0.5)
Eosinophils Relative: 1 %
HCT: 19.5 % — ABNORMAL LOW (ref 39.0–52.0)
Hemoglobin: 6.1 g/dL — CL (ref 13.0–17.0)
Immature Granulocytes: 1 %
Lymphocytes Relative: 9 %
Lymphs Abs: 0.6 10*3/uL — ABNORMAL LOW (ref 0.7–4.0)
MCH: 31.6 pg (ref 26.0–34.0)
MCHC: 31.3 g/dL (ref 30.0–36.0)
MCV: 101 fL — ABNORMAL HIGH (ref 80.0–100.0)
Monocytes Absolute: 0.8 10*3/uL (ref 0.1–1.0)
Monocytes Relative: 13 %
Neutro Abs: 5.1 10*3/uL (ref 1.7–7.7)
Neutrophils Relative %: 76 %
Platelets: 93 10*3/uL — ABNORMAL LOW (ref 150–400)
RBC: 1.93 MIL/uL — ABNORMAL LOW (ref 4.22–5.81)
RDW: 14.3 % (ref 11.5–15.5)
WBC: 6.5 10*3/uL (ref 4.0–10.5)
nRBC: 0 % (ref 0.0–0.2)

## 2020-01-23 LAB — COMPREHENSIVE METABOLIC PANEL
ALT: 23 U/L (ref 0–44)
AST: 25 U/L (ref 15–41)
Albumin: 3.7 g/dL (ref 3.5–5.0)
Alkaline Phosphatase: 55 U/L (ref 38–126)
Anion gap: 7 (ref 5–15)
BUN: 47 mg/dL — ABNORMAL HIGH (ref 6–20)
CO2: 21 mmol/L — ABNORMAL LOW (ref 22–32)
Calcium: 8.6 mg/dL — ABNORMAL LOW (ref 8.9–10.3)
Chloride: 109 mmol/L (ref 98–111)
Creatinine, Ser: 1.95 mg/dL — ABNORMAL HIGH (ref 0.61–1.24)
GFR calc Af Amer: 45 mL/min — ABNORMAL LOW (ref >60)
GFR calc non Af Amer: 38 mL/min — ABNORMAL LOW (ref >60)
Glucose, Bld: 86 mg/dL (ref 70–99)
Potassium: 5.4 mmol/L — ABNORMAL HIGH (ref 3.5–5.1)
Sodium: 137 mmol/L (ref 135–145)
Total Bilirubin: 0.6 mg/dL (ref 0.3–1.2)
Total Protein: 7 g/dL (ref 6.5–8.1)

## 2020-01-23 LAB — GRAM STAIN

## 2020-01-23 LAB — PROTIME-INR
INR: 1.5 — ABNORMAL HIGH (ref 0.8–1.2)
Prothrombin Time: 18.1 seconds — ABNORMAL HIGH (ref 11.4–15.2)

## 2020-01-23 LAB — APTT: aPTT: 43 seconds — ABNORMAL HIGH (ref 24–36)

## 2020-01-23 LAB — LACTIC ACID, PLASMA: Lactic Acid, Venous: 1 mmol/L (ref 0.5–1.9)

## 2020-01-23 IMAGING — CR DG CHEST 2V
2 series · 2 of 2 positions shown · non-contrast
Comparison: [DATE]

CLINICAL DATA: Abdominal pain.

EXAM:
CHEST - 2 VIEW

[w chest lat]
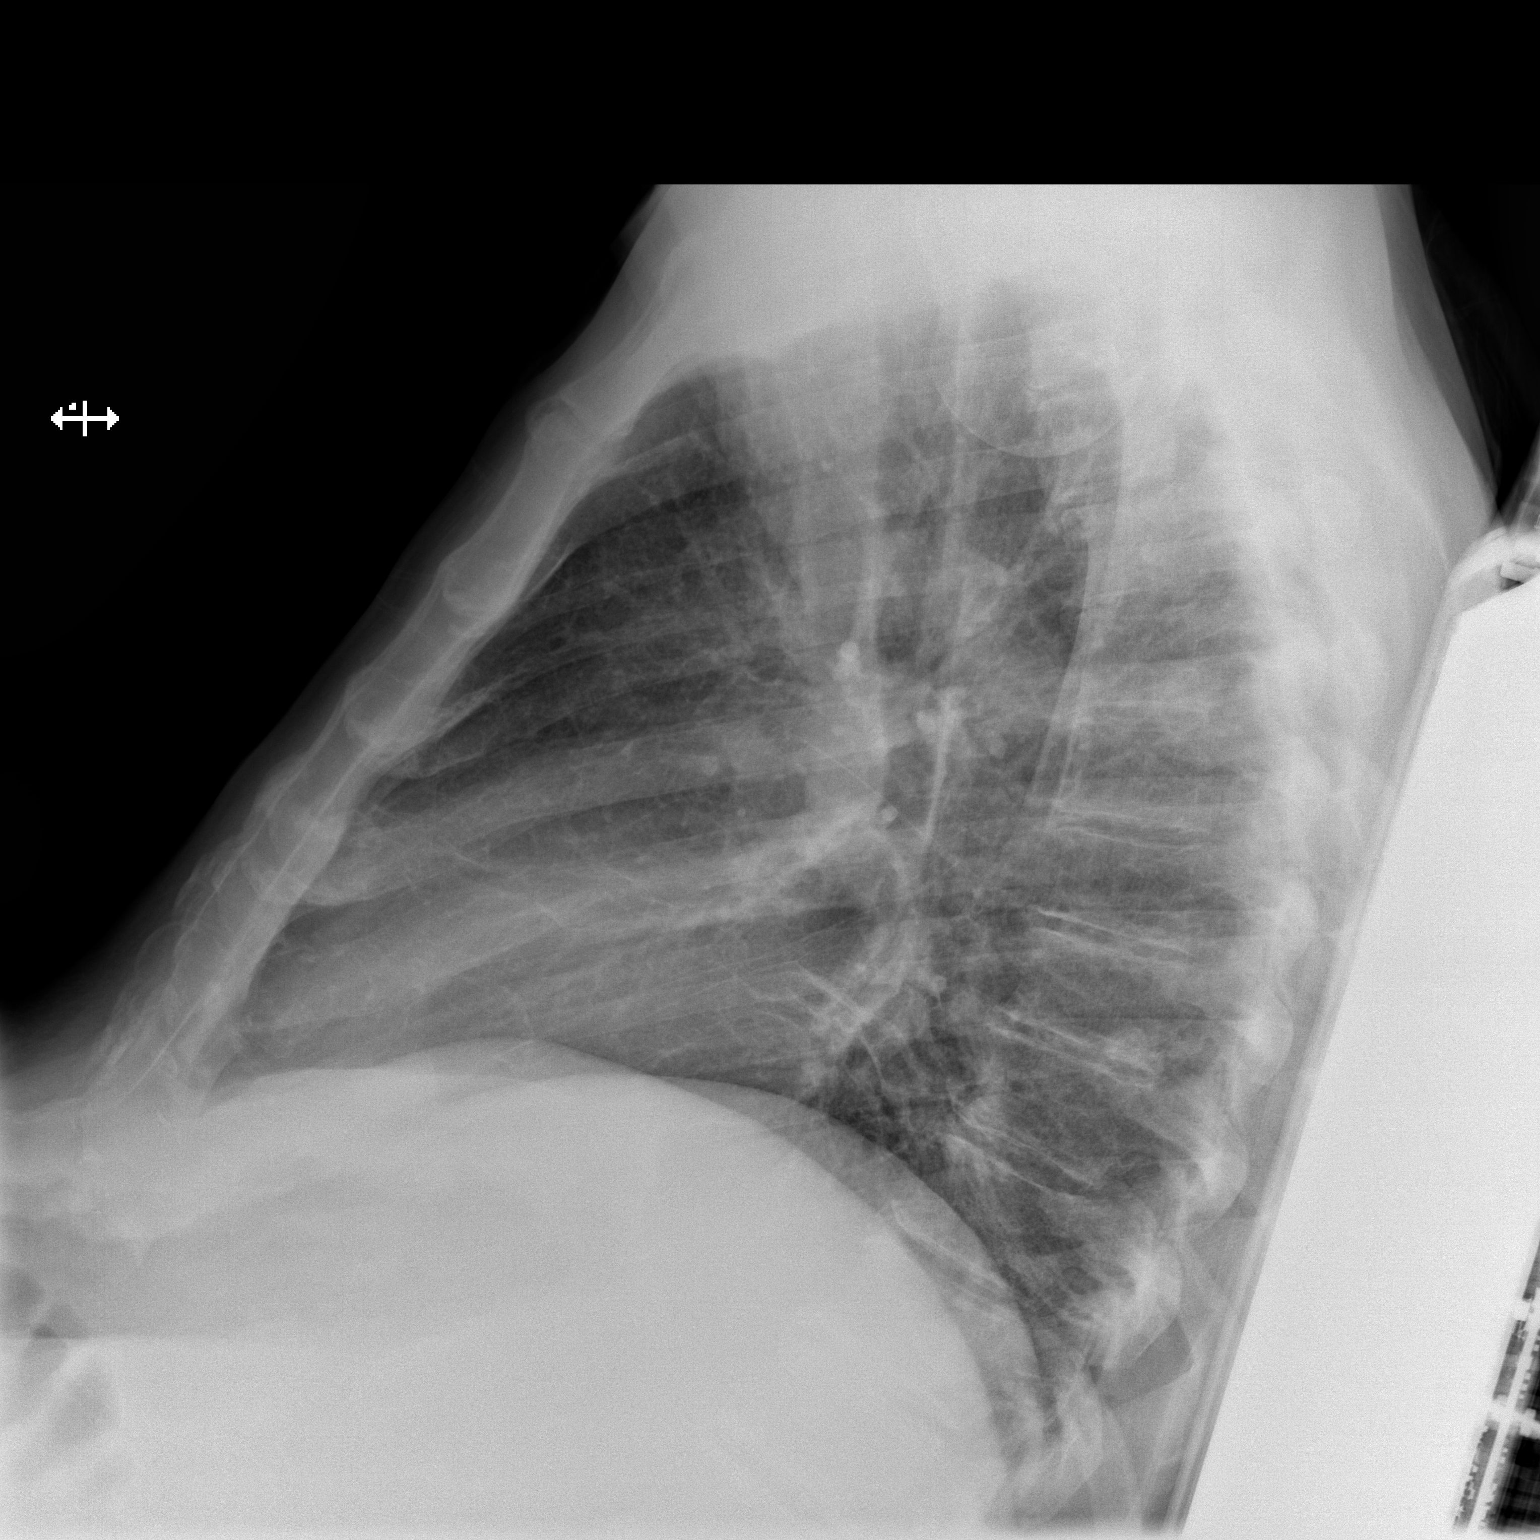

[x chest ap]
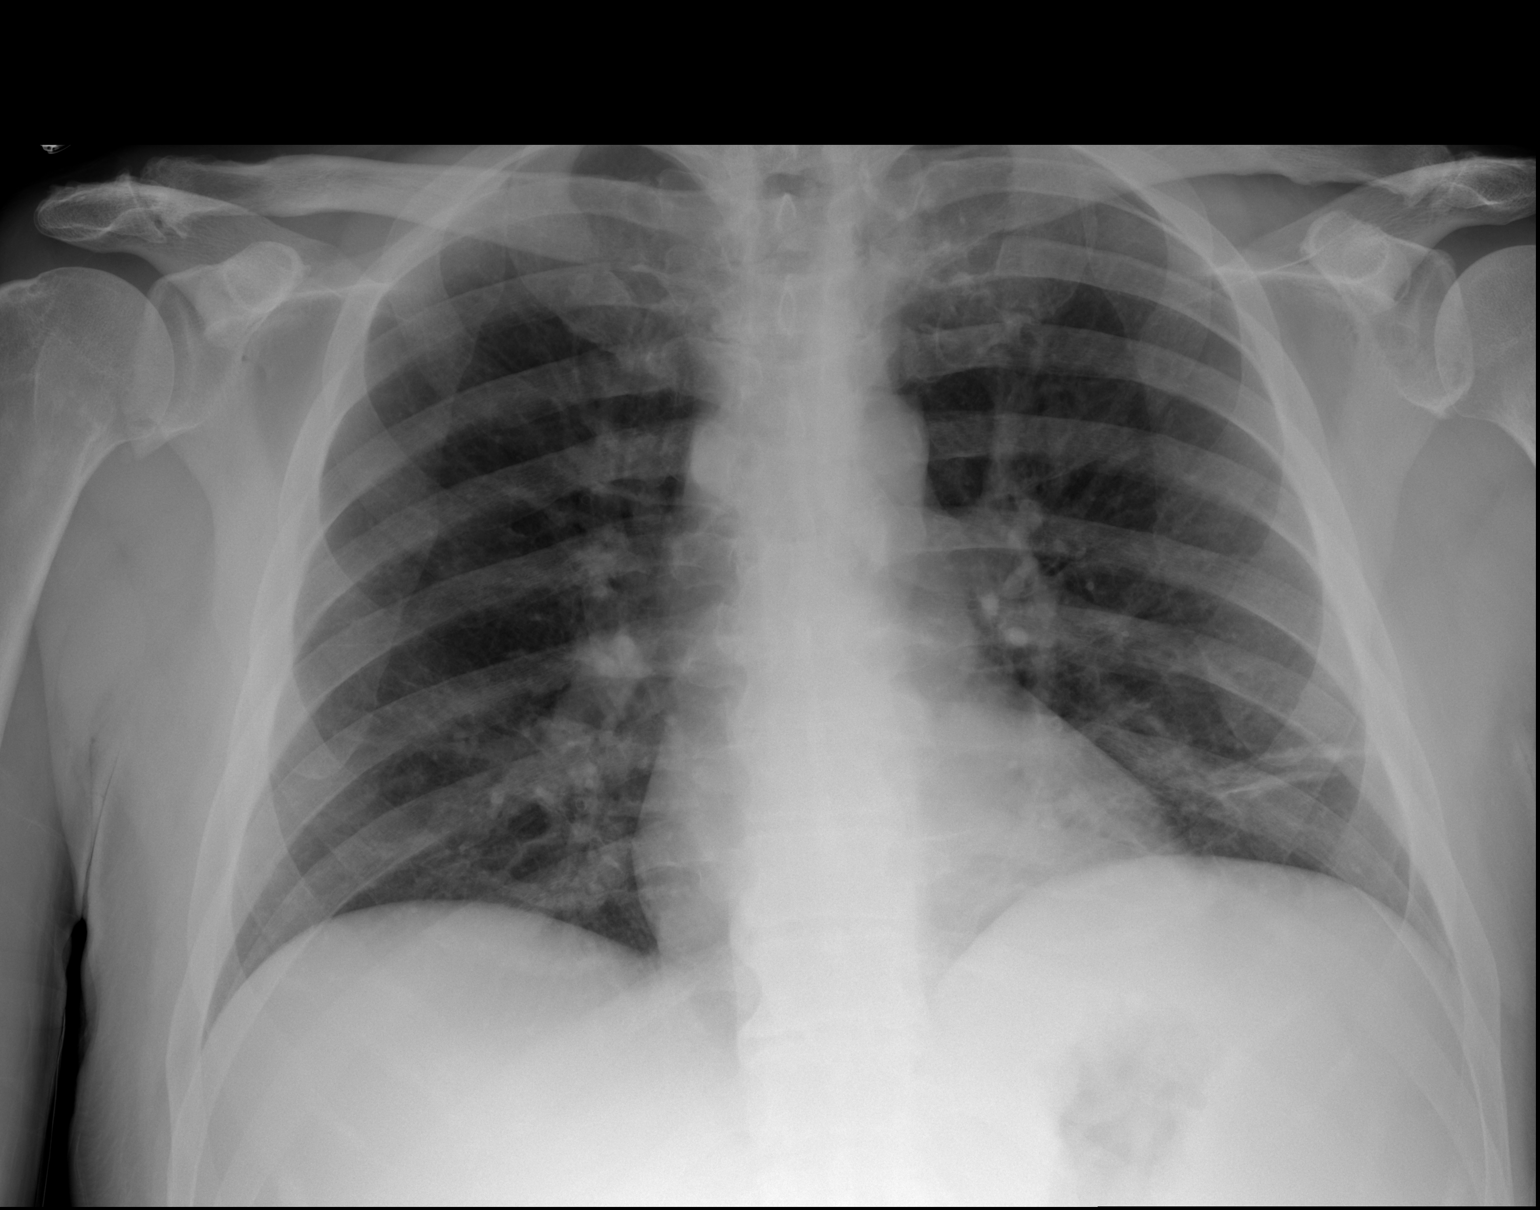

[2 of 2 positions shown; findings below may reference images not displayed]

FINDINGS: The cardiomediastinal silhouette is unchanged with normal heart
size. Mild scarring is again noted in the left lung base. No acute
airspace consolidation, edema, pleural effusion, pneumothorax is
identified. No acute osseous abnormality is seen.
IMPRESSION: No active cardiopulmonary disease.

## 2020-01-23 IMAGING — US IR PARACENTESIS
2 series · 4 of 4 positions shown · non-contrast
Comparison: none

INDICATION: Abdominal distention. Recurrent ascites. Request for therapeutic and
diagnostic paracentesis.

[Series 1: (id) · 2 of 2 slices shown (1 of 2)]
[im 1/2]
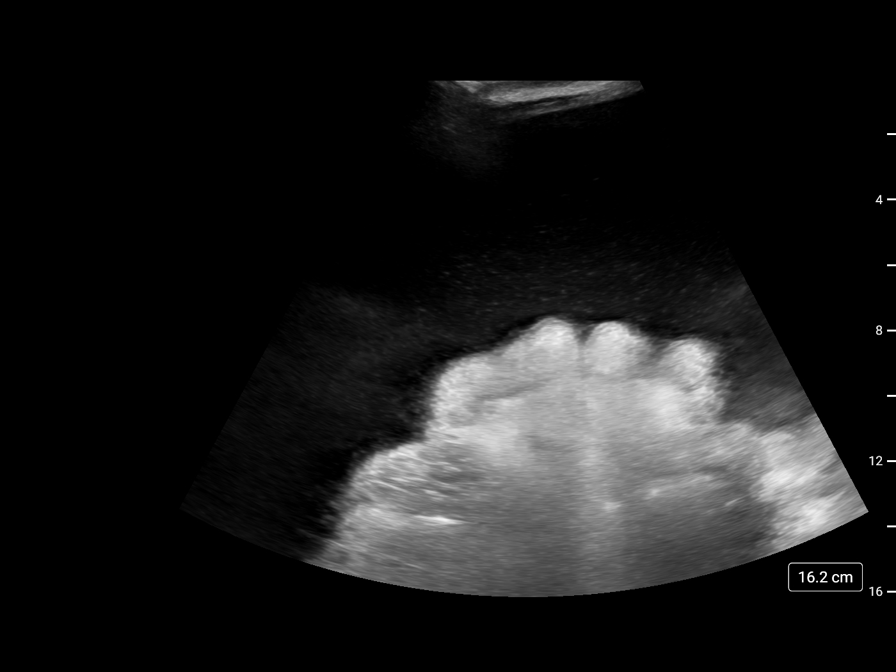
[im 2/2]
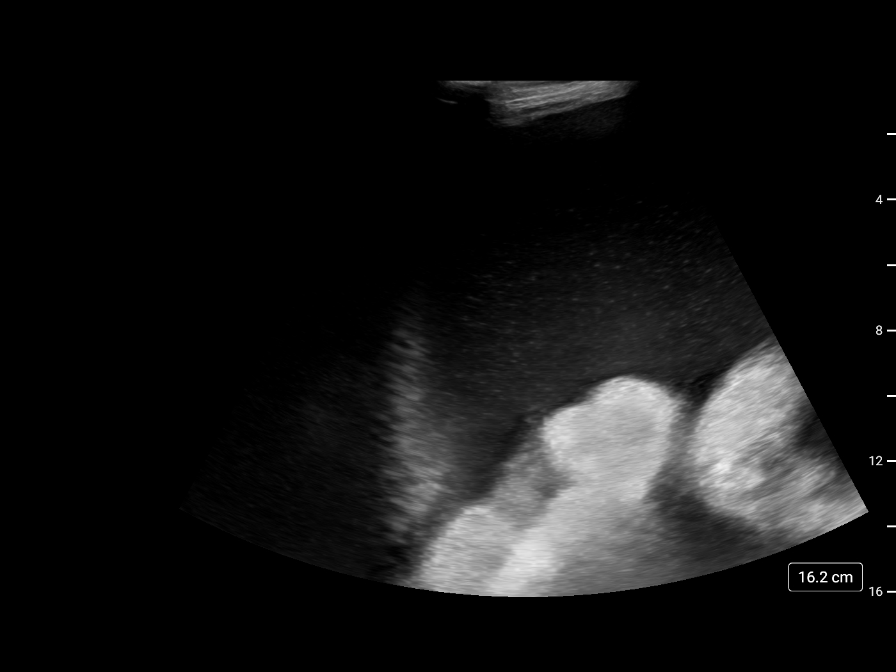

[Series 1: (id) · 2 of 2 slices shown (2 of 2)]
[im 1/2]
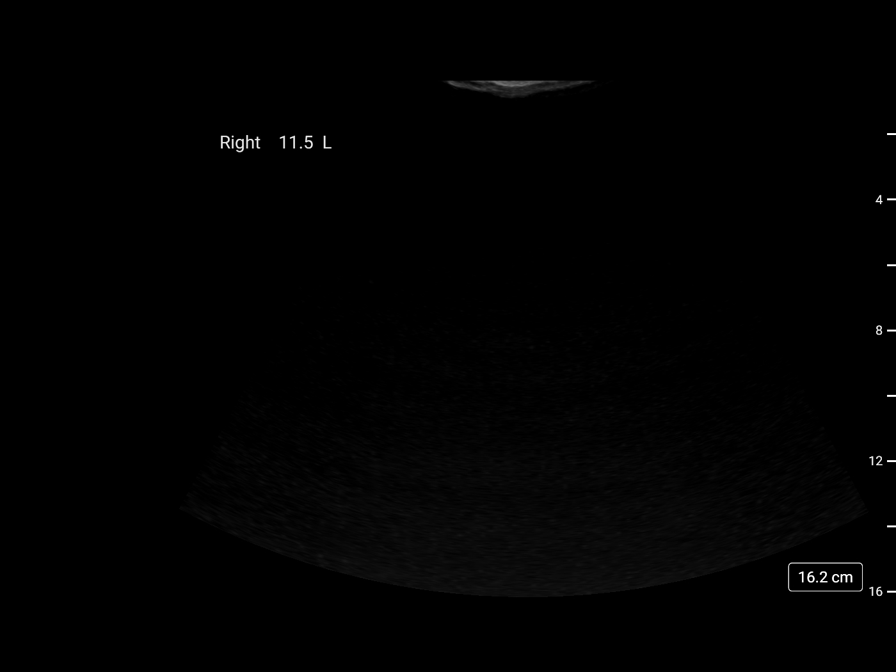
[im 2/2]
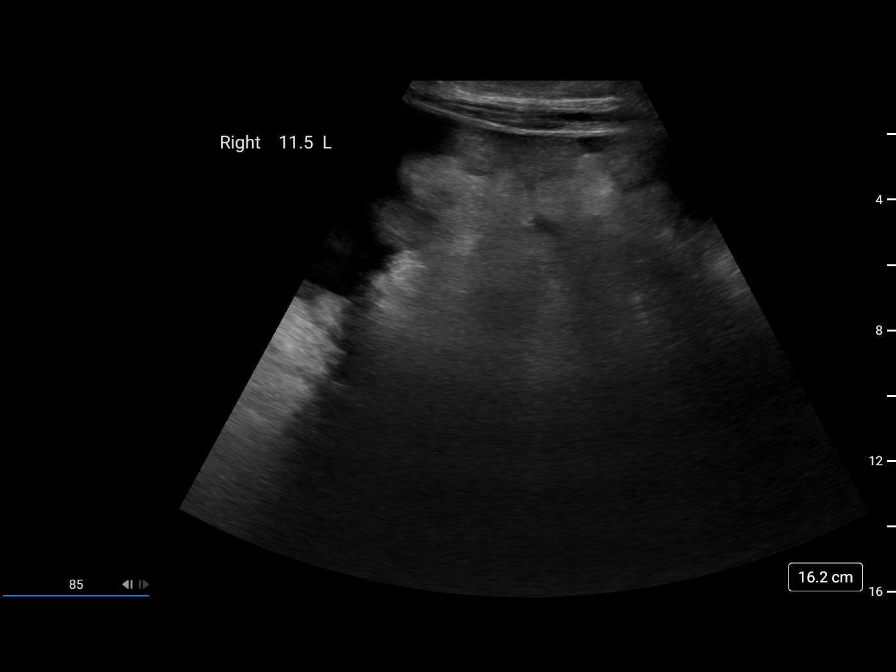

[4 of 4 positions shown; findings below may reference images not displayed]

EXAM:
ULTRASOUND GUIDED RIGHT LOWER QUADRANT PARACENTESIS

MEDICATIONS:
None.

COMPLICATIONS:
None immediate.

PROCEDURE:
Informed written consent was obtained from the patient after a
discussion of the risks, benefits and alternatives to treatment. A
timeout was performed prior to the initiation of the procedure.

Initial ultrasound scanning demonstrates a large amount of ascites
within the right lower abdominal quadrant. The right lower abdomen
was prepped and draped in the usual sterile fashion. 1% lidocaine
was used for local anesthesia.

Following this, a 19 gauge, 7-cm, Yueh catheter was introduced. An
ultrasound image was saved for documentation purposes. The
paracentesis was performed. The catheter was removed and a dressing
was applied. The patient tolerated the procedure well without
immediate post procedural complication.
FINDINGS: A total of approximately 11.1 L of hazy yellow fluid was removed.
Samples were sent to the laboratory as requested by the clinical
team.
IMPRESSION: Successful ultrasound-guided paracentesis yielding 11.1 liters of
peritoneal fluid.

## 2020-01-23 MED ORDER — ALBUMIN HUMAN 25 % IV SOLN
INTRAVENOUS | Status: AC
  Filled 2020-01-23: qty 200

## 2020-01-23 MED ORDER — ALBUMIN HUMAN 25 % IV SOLN
75.0000 g | Freq: Once | INTRAVENOUS | Status: AC
  Administered 2020-01-23: 75 g via INTRAVENOUS

## 2020-01-23 MED ORDER — SODIUM CHLORIDE 0.9 % IV BOLUS
1000.0000 mL | Freq: Once | INTRAVENOUS | Status: AC
  Administered 2020-01-23: 1000 mL via INTRAVENOUS

## 2020-01-23 MED ORDER — ALBUMIN HUMAN 25 % IV SOLN: 50.0000 g | Freq: Once | INTRAVENOUS | Status: DC

## 2020-01-23 MED ORDER — ALBUMIN HUMAN 25 % IV SOLN
INTRAVENOUS | Status: AC
  Filled 2020-01-23: qty 100

## 2020-01-23 MED ORDER — LIDOCAINE HCL 1 % IJ SOLN
INTRAMUSCULAR | Status: AC
  Filled 2020-01-23: qty 20

## 2020-01-23 MED ORDER — SODIUM CHLORIDE 0.9 % IV SOLN
2.0000 g | Freq: Once | INTRAVENOUS | Status: AC
  Administered 2020-01-23: 2 g via INTRAVENOUS
  Filled 2020-01-23: qty 2

## 2020-01-23 NOTE — ED Triage Notes (Signed)
Patient was sent here by his PCP, he had a paracentesis and he said they found an infection. States his stomach is sore.

## 2020-01-23 NOTE — Telephone Encounter (Signed)
I called the patient to review cell count results from paracentesis performed earlier today.   States he had subjective fever yesterday (does not have thermometer) along with abdominal pain yesterday and this morning. No fever today. Mild pain still present after paracentesis.   Paracentesis performed earlier today 11 L removed, with cell count notable for SBP (or CNNA given negative Gram stain so far).  Given high concern for SBP, recommended returning through the ER for admission to Hospitalist service for treatment with IV antibiotics (Cefotaxime 2 gm Q8 IV), IV albumin (1.5 gm/kg), and close monitoring. Confirmed no Abx allergies. Will need to d/c beta blocker.    Given the refractory nature of his ascites requiring serial large-volume paracentesis and renal intolerance to uptitration of diuretics, may potentially expedite TIPS evaluation for therapeutic intent while he is inpatient.  TTE completed last week as part of this evaluation. Can coordinate as appropriate on inpatient service.  All questions answered and appreciative of phone call. Will plan to have family member drive him to [facility] ER now.

## 2020-01-23 NOTE — ED Notes (Signed)
Pt transported to xray 

## 2020-01-23 NOTE — ED Triage Notes (Signed)
Pt reports to the ED after having a paracentesis done. Patient got a call from PCP following the procedure about concerns for infection in his blood.

## 2020-01-23 NOTE — H&P (Signed)
History and Physical    Tj Kitchings IDP:[id number] DOB: 1966/05/08 DOA: 01/23/2020  PCP: [name], NP Patient coming from: Home  Chief Complaint: Concern for infection  HPI: Martin Strong is a 54 y.o. male with medical history significant of NASH cirrhosis with associated esophageal varices and recurring variceal bleeding requiring multiple EGDs with banding in the past, portal hypertensive gastropathy, history of diuretic intolerant ascites requiring large-volume paracentesis, thrombocytopenia, anemia, hypertension, hyperlipidemia, non-insulin-dependent diabetes presenting to the ED after undergoing large-volume paracentesis earlier today, 11 L removed with cell count concerning for SBP.  Patient was sent to the ED by GI for hospital admission.  Patient states his abdomen feels sore.  States he had a fever at home but did not take Tylenol or any other medication.  Denies nausea or vomiting.  Denies hematemesis, hematochezia, or melena.  Reports compliance with his home iron supplement.  Reports fatigue.  Denies lightheadedness/dizziness, chest pain, or shortness of breath.  ED Course: Afebrile.  Not tachycardic.  Hypotensive.  Labs showing no leukocytosis.  Lactic acid normal.  Hemoglobin 6.1, previously in the 11-12 range.  Platelet count 93,000, chronically low.  Potassium 5.4.  BUN 47, creatinine 1.9.  Baseline creatinine 0.7-0.9.  LFTs normal.  Respiratory panel pending.  UA and urine culture pending.  Blood culture x2 pending. Chest x-ray showing no active cardiopulmonary disease.  Patient received cefepime and 1 L normal saline bolus.  Blood pressure improved after fluid bolus.  Review of Systems:  All systems reviewed and apart from history of presenting illness, are negative.  Past Medical History:  Diagnosis Date  . Anemia   . Arthritis   . Cirrhosis ([place])   . Diabetes mellitus without complication ([place])   . Fatty liver   . GERD (gastroesophageal reflux disease)     . GI bleed   . Hyperlipidemia   . Hypertension     Past Surgical History:  Procedure Laterality Date  . BIOPSY  02/14/2019   Procedure: BIOPSY;  Surgeon: Lavena Bullion, DO;  Location: [facility];  Service: Gastroenterology;;  . BIOPSY  07/22/2019   Procedure: BIOPSY;  Surgeon: Lavena Bullion, DO;  Location: [facility];  Service: Gastroenterology;;  . ESOPHAGEAL BANDING  10/07/2018   Procedure: ESOPHAGEAL BANDING;  Surgeon: Lavena Bullion, DO;  Location: [place];  Service: Gastroenterology;;  . ESOPHAGEAL BANDING N/A 01/03/2019   Procedure: ESOPHAGEAL BANDING;  Surgeon: Lavena Bullion, DO;  Location: [facility];  Service: Gastroenterology;  Laterality: N/A;  . ESOPHAGEAL BANDING  02/14/2019   Procedure: ESOPHAGEAL BANDING;  Surgeon: Lavena Bullion, DO;  Location: [facility];  Service: Gastroenterology;;  . esophageal bands    . ESOPHAGOGASTRODUODENOSCOPY Left 07/22/2019   Procedure: ESOPHAGOGASTRODUODENOSCOPY (EGD) WITH POSSIBLE BANDING;  Surgeon: Lavena Bullion, DO;  Location: [facility];  Service: Gastroenterology;  Laterality: Left;  . ESOPHAGOGASTRODUODENOSCOPY (EGD) WITH PROPOFOL N/A 10/07/2018   Procedure: ESOPHAGOGASTRODUODENOSCOPY (EGD) WITH PROPOFOL;  Surgeon: Lavena Bullion, DO;  Location: [place];  Service: Gastroenterology;  Laterality: N/A;  . ESOPHAGOGASTRODUODENOSCOPY (EGD) WITH PROPOFOL N/A 01/03/2019   Procedure: ESOPHAGOGASTRODUODENOSCOPY (EGD) WITH PROPOFOL;  Surgeon: Lavena Bullion, DO;  Location: [facility];  Service: Gastroenterology;  Laterality: N/A;  . ESOPHAGOGASTRODUODENOSCOPY (EGD) WITH PROPOFOL N/A 02/14/2019   Procedure: ESOPHAGOGASTRODUODENOSCOPY (EGD) WITH PROPOFOL;  Surgeon: Lavena Bullion, DO;  Location: [facility];  Service: Gastroenterology;  Laterality: N/A;  . IR PARACENTESIS  10/05/2019  . IR PARACENTESIS  10/24/2019  . IR PARACENTESIS  11/16/2019  .  IR PARACENTESIS  12/25/2019  . IR PARACENTESIS   01/05/2020  . IR PARACENTESIS  01/23/2020  . IR RADIOLOGIST EVAL & MGMT  12/19/2019     reports that he has never smoked. He has never used smokeless tobacco. He reports that he does not drink alcohol or use drugs.  Allergies  Allergen Reactions  . Nadolol Itching and Nausea Only    Family History  Problem Relation Age of Onset  . Diabetes Other   . Esophageal cancer Father   . Colon cancer Neg Hx   . Rectal cancer Neg Hx   . Stomach cancer Neg Hx     Prior to Admission medications   Medication Sig Start Date End Date Taking? Authorizing Provider  cholecalciferol (VITAMIN D) 1000 units tablet Take 1,000 Units by mouth 2 (two) times daily.   Yes [provider]  dapagliflozin propanediol (FARXIGA) 10 MG TABS tablet Take 10 mg by mouth every evening.    Yes [provider]  ferrous sulfate (KP FERROUS SULFATE) 325 (65 FE) MG tablet Take 1 tablet (325 mg total) by mouth daily with breakfast. Patient taking differently: Take 325 mg by mouth 2 (two) times daily.  05/30/19  Yes Cirigliano, Vito V, DO  furosemide (LASIX) 20 MG tablet Take 1 tablet (20 mg total) by mouth daily. 12/27/19  Yes Cirigliano, Vito V, DO  glipiZIDE (GLUCOTROL) 10 MG tablet Take 10 mg by mouth 2 (two) times daily.   Yes [provider]  lisinopril (PRINIVIL,ZESTRIL) 20 MG tablet Take 20 mg by mouth 2 (two) times daily.   Yes [provider]  metFORMIN (GLUCOPHAGE) 1000 MG tablet Take 1,000 mg by mouth 2 (two) times daily with a meal.   Yes [provider]  Multiple Vitamin (MULTIVITAMIN) tablet Take 1 tablet by mouth daily.   Yes [provider]  pantoprazole (PROTONIX) 40 MG tablet Take 1 tablet (40 mg total) by mouth 2 (two) times daily. 03/31/19  Yes Cirigliano, Vito V, DO  propranolol (INDERAL) 40 MG tablet Take 40 mg by mouth 2 (two) times daily.    Yes [provider]  spironolactone (ALDACTONE) 25 MG tablet Take 1 tablet (25 mg total) by mouth daily.  12/27/19  Yes CiriglianoDominic Pea, DO    Physical Exam: Vitals:   01/23/20 1905 01/23/20 1906 01/23/20 2111 01/23/20 2300  BP: (!) 99/53  (!) 85/52 (!) 100/57  Pulse: 100  89 90  Resp: 18  17 16   Temp: 98.4 F (36.9 C)     TempSrc: Oral     SpO2: 99%  98% 100%  Weight:  106.6 kg    Height:  6' 1"  (1.854 m)      Physical Exam  Constitutional: He is oriented to person, place, and time. He appears well-developed and well-nourished. No distress.  HENT:  Head: Normocephalic.  Eyes: Right eye exhibits no discharge. Left eye exhibits no discharge.  Cardiovascular: Normal rate, regular rhythm and intact distal pulses.  Pulmonary/Chest: Effort normal. No respiratory distress. He has no wheezes.  Abdominal: Soft. Bowel sounds are normal. He exhibits distension. There is abdominal tenderness. There is no rebound and no guarding.  Mild generalized tenderness to palpation  Musculoskeletal:        General: No edema.     Cervical back: Neck supple.  Neurological: He is alert and oriented to person, place, and time.  Skin: Skin is warm and dry. He is not diaphoretic.     Labs on Admission: I have  personally reviewed following labs and imaging studies  CBC: Recent Labs  Lab 01/23/20 2008  WBC 6.5  NEUTROABS 5.1  HGB 6.1*  HCT 19.5*  MCV 101.0*  PLT 93*   Basic Metabolic Panel: Recent Labs  Lab 01/23/20 2008  NA 137  K 5.4*  CL 109  CO2 21*  GLUCOSE 86  BUN 47*  CREATININE 1.95*  CALCIUM 8.6*   GFR: Estimated Creatinine Clearance: 56.8 mL/min (A) (by C-G formula based on SCr of 1.95 mg/dL (H)). Liver Function Tests: Recent Labs  Lab 01/23/20 2008  AST 25  ALT 23  ALKPHOS 55  BILITOT 0.6  PROT 7.0  ALBUMIN 3.7   No results for input(s): LIPASE, AMYLASE in the last 168 hours. No results for input(s): AMMONIA in the last 168 hours. Coagulation Profile: Recent Labs  Lab 01/23/20 1938  INR 1.5*   Cardiac Enzymes: No results for input(s): CKTOTAL, CKMB,  CKMBINDEX, TROPONINI in the last 168 hours. BNP (last 3 results) No results for input(s): PROBNP in the last 8760 hours. HbA1C: No results for input(s): HGBA1C in the last 72 hours. CBG: No results for input(s): GLUCAP in the last 168 hours. Lipid Profile: No results for input(s): CHOL, HDL, LDLCALC, TRIG, CHOLHDL, LDLDIRECT in the last 72 hours. Thyroid Function Tests: No results for input(s): TSH, T4TOTAL, FREET4, T3FREE, THYROIDAB in the last 72 hours. Anemia Panel: No results for input(s): VITAMINB12, FOLATE, FERRITIN, TIBC, IRON, RETICCTPCT in the last 72 hours. Urine analysis: No results found for: COLORURINE, APPEARANCEUR, LABSPEC, [place], GLUCOSEU, HGBUR, BILIRUBINUR, KETONESUR, PROTEINUR, UROBILINOGEN, NITRITE, LEUKOCYTESUR  Radiological Exams on Admission: DG Chest 2 View  Result Date: 01/23/2020 CLINICAL DATA:  Abdominal pain. EXAM: CHEST - 2 VIEW COMPARISON:  07/21/2019 FINDINGS: The cardiomediastinal silhouette is unchanged with normal heart size. Mild scarring is again noted in the left lung base. No acute airspace consolidation, edema, pleural effusion, pneumothorax is identified. No acute osseous abnormality is seen. IMPRESSION: No active cardiopulmonary disease. Electronically Signed   By: Logan Bores M.D.   On: 01/23/2020 19:37   IR Paracentesis  Result Date: 01/23/2020 INDICATION: Abdominal distention. Recurrent ascites. Request for therapeutic and diagnostic paracentesis. EXAM: ULTRASOUND GUIDED RIGHT LOWER QUADRANT PARACENTESIS MEDICATIONS: None. COMPLICATIONS: None immediate. PROCEDURE: Informed written consent was obtained from the patient after a discussion of the risks, benefits and alternatives to treatment. A timeout was performed prior to the initiation of the procedure. Initial ultrasound scanning demonstrates a large amount of ascites within the right lower abdominal quadrant. The right lower abdomen was prepped and draped in the usual sterile fashion. 1%  lidocaine was used for local anesthesia. Following this, a 19 gauge, 7-cm, Yueh catheter was introduced. An ultrasound image was saved for documentation purposes. The paracentesis was performed. The catheter was removed and a dressing was applied. The patient tolerated the procedure well without immediate post procedural complication. FINDINGS: A total of approximately 11.1 L of hazy yellow fluid was removed. Samples were sent to the laboratory as requested by the clinical team. IMPRESSION: Successful ultrasound-guided paracentesis yielding 11.1 liters of peritoneal fluid. Read by: Ascencion Dike PA-C Electronically Signed   By: Corrie Mckusick D.O.   On: 01/23/2020 10:11    EKG: Independently reviewed.  Sinus rhythm.  Assessment/Plan Principal Problem:   SBP (spontaneous bacterial peritonitis) ([place]) Active Problems:   Cirrhosis ([place])   Diabetes mellitus without complication ([facility])   Portal hypertensive gastropathy ([facility])   Esophageal varices ([facility])   SBP versus CNNA, hypotension in the setting of  decompensated liver cirrhosis with refractory ascites requiring large-volume paracentesis and renal intolerance to up titration of diuretics Underwent large-volume paracentesis today, 11 L removed. Ascitic fluid analysis labs showing 2480 WBCs with negative Gram stain.  Findings concerning for spontaneous bacterial peritonitis versus culture negative neutrophilic ascites.  Sent to the hospital by GI. Afebrile and no leukocytosis.  Lactic acid normal.  Suspect hypotension is due to paracentesis induced circulatory dysfunction.  Did receive albumin at the time of paracentesis but continues to be hypotensive.  Received 1 L fluid bolus with no improvement.  No tachycardia or other signs of sepsis. -Additional small fluid bolus and additional albumin.  Monitor blood pressure closely. -Cefepime -Hold diuretics -Ascitic fluid culture pending -Blood culture x2 pending -GI consultation in a.m.  Symptomatic anemia  in the setting of history of NASH cirrhosis with associated esophageal varices and recurring variceal bleeding requiring multiple EGDs with banding in the past Significant drop in hemoglobin from baseline.  Hemoglobin currently 6.1, previously in the 11-12 range.  FOBT positive but no frank bleeding noticed on rectal exam done in the ED.  Patient denies hematemesis, hematochezia, or melena. -Type and screen -Discussed giving blood transfusion and patient agrees. 1 unit PRBCs ordered.  -Hold beta-blocker given hypotension -Post transfusion H&H -Anemia panel -Keep n.p.o. -GI evaluation in a.m.  Portal hypertensive gastropathy -Hold beta-blocker as mentioned above -IV PPI for both reflux and portal hypertensive gastritis  Chronic thrombocytopenia Likely related to liver disease. Platelet count 93,000, chronically low.   -Continue to monitor  AKI Could be prerenal due to hypotension versus hepatorenal. BUN 47, creatinine 1.9.  Baseline creatinine 0.7-0.9.  -Received fluid for hypotension -Continue to monitor renal function and urine output -Hold diuretics at this time  Non-insulin-dependent diabetes mellitus -Check A1c.  Sliding scale insulin sensitive every 4 hours and CBG checks.  DVT prophylaxis: SCDs at this time Code Status: Full code Family Communication: No family available at this time. Disposition Plan: Anticipate discharge after clinical improvement. Admission status: It is my clinical opinion that admission to INPATIENT is reasonable and necessary in this 53 y.o. male . presenting with concern for SBP versus CNNA, hypotension.  GI evaluation in a.m.  High risk of decompensation.  Given the aforementioned, the predictability of an adverse outcome is felt to be significant. I expect that the patient will require at least 2 midnights in the hospital to treat this condition.   The medical decision making on this patient was of high complexity and the patient is at high risk  for clinical deterioration, therefore this is a level 3 visit.   Shela Leff MD [facility]  If 7PM-7AM, please contact night-coverage [email or web address] Password [facility]  01/24/2020, 12:45 AM

## 2020-01-23 NOTE — Procedures (Signed)
PROCEDURE SUMMARY:  Successful US guided paracentesis from RLQ.  Yielded 11.1 L of hazy yellow fluid.  No immediate complications.  Pt tolerated well.   Specimen was sent for labs.  EBL < 61m  KAscencion DikePA-C 01/23/2020 10:09 AM

## 2020-01-23 NOTE — ED Provider Notes (Signed)
[facility] DEPT Provider Note   CSN: [id number] Arrival date & time: 01/23/20  1853     History Chief Complaint  Patient presents with  . Blood Infection    Martin Strong is a 54 y.o. male.  Patient with a history of cirrhosis, diabetes, esophageal varices presenting after having a paracentesis today.  States he was called to come to the ED with concern for infection.  He states he has had abdominal pain for the past 3 to 3 days has been diffuse associated nausea and subjective fever.  Not check his temperature at home.  He had removal of 11 L of ascitic fluid today.  He was told to come to the hospital for infection.  He states he has never had an infection in the past.  He denies any difficulty breathing or chest pain.  He denies any vomiting or diarrhea.  No focal weakness, numbness or tingling.  No chest pain or shortness of breath.  No pain with urination or blood in the urine.   The history is provided by the patient.       Past Medical History:  Diagnosis Date  . Anemia   . Arthritis   . Cirrhosis ([place])   . Diabetes mellitus without complication ([place])   . Fatty liver   . GERD (gastroesophageal reflux disease)   . GI bleed   . Hyperlipidemia   . Hypertension     Patient Active Problem List   Diagnosis Date Noted  . Esophageal varices ([place]) 07/21/2019  . Dysphagia 07/21/2019  . GERD (gastroesophageal reflux disease) 07/21/2019  . Gastritis and gastroduodenitis   . Varices of esophagus determined by endoscopy ([place])   . Portal hypertensive gastropathy ([place])   . Acute upper GI bleed 10/06/2018  . Cirrhosis ([place]) 10/06/2018  . Hypertension 10/06/2018  . Diabetes mellitus without complication ([place]) 02/40/9735    Past Surgical History:  Procedure Laterality Date  . BIOPSY  02/14/2019   Procedure: BIOPSY;  Surgeon: Lavena Bullion, DO;  Location: [facility];  Service: Gastroenterology;;  . BIOPSY  07/22/2019   Procedure:  BIOPSY;  Surgeon: Lavena Bullion, DO;  Location: [facility];  Service: Gastroenterology;;  . ESOPHAGEAL BANDING  10/07/2018   Procedure: ESOPHAGEAL BANDING;  Surgeon: Lavena Bullion, DO;  Location: [facility];  Service: Gastroenterology;;  . ESOPHAGEAL BANDING N/A 01/03/2019   Procedure: ESOPHAGEAL BANDING;  Surgeon: Lavena Bullion, DO;  Location: [facility];  Service: Gastroenterology;  Laterality: N/A;  . ESOPHAGEAL BANDING  02/14/2019   Procedure: ESOPHAGEAL BANDING;  Surgeon: Lavena Bullion, DO;  Location: [facility];  Service: Gastroenterology;;  . esophageal bands    . ESOPHAGOGASTRODUODENOSCOPY Left 07/22/2019   Procedure: ESOPHAGOGASTRODUODENOSCOPY (EGD) WITH POSSIBLE BANDING;  Surgeon: Lavena Bullion, DO;  Location: [facility];  Service: Gastroenterology;  Laterality: Left;  . ESOPHAGOGASTRODUODENOSCOPY (EGD) WITH PROPOFOL N/A 10/07/2018   Procedure: ESOPHAGOGASTRODUODENOSCOPY (EGD) WITH PROPOFOL;  Surgeon: Lavena Bullion, DO;  Location: [facility];  Service: Gastroenterology;  Laterality: N/A;  . ESOPHAGOGASTRODUODENOSCOPY (EGD) WITH PROPOFOL N/A 01/03/2019   Procedure: ESOPHAGOGASTRODUODENOSCOPY (EGD) WITH PROPOFOL;  Surgeon: Lavena Bullion, DO;  Location: [facility];  Service: Gastroenterology;  Laterality: N/A;  . ESOPHAGOGASTRODUODENOSCOPY (EGD) WITH PROPOFOL N/A 02/14/2019   Procedure: ESOPHAGOGASTRODUODENOSCOPY (EGD) WITH PROPOFOL;  Surgeon: Lavena Bullion, DO;  Location: [facility];  Service: Gastroenterology;  Laterality: N/A;  . IR PARACENTESIS  10/05/2019  . IR PARACENTESIS  10/24/2019  . IR PARACENTESIS  11/16/2019  .  IR PARACENTESIS  12/25/2019  . IR PARACENTESIS  01/05/2020  . IR PARACENTESIS  01/23/2020  . IR RADIOLOGIST EVAL & MGMT  12/19/2019       Family History  Problem Relation Age of Onset  . Diabetes Other   . Esophageal cancer Father   . Colon cancer Neg Hx   . Rectal cancer Neg Hx   . Stomach cancer Neg Hx      Social History   Tobacco Use  . Smoking status: Never Smoker  . Smokeless tobacco: Never Used  Substance Use Topics  . Alcohol use: Never  . Drug use: Never    Home Medications Prior to Admission medications   Medication Sig Start Date End Date Taking? Authorizing Provider  cholecalciferol (VITAMIN D) 1000 units tablet Take 1,000 Units by mouth 2 (two) times daily.    [provider]  dapagliflozin propanediol (FARXIGA) 10 MG TABS tablet Take 10 mg by mouth every evening.     [provider]  ferrous sulfate (KP FERROUS SULFATE) 325 (65 FE) MG tablet Take 1 tablet (325 mg total) by mouth daily with breakfast. Patient taking differently: Take 325 mg by mouth 2 (two) times daily.  05/30/19   Cirigliano, Vito V, DO  furosemide (LASIX) 20 MG tablet Take 1 tablet (20 mg total) by mouth daily. 12/27/19   Cirigliano, Vito V, DO  glipiZIDE (GLUCOTROL) 10 MG tablet Take 10 mg by mouth 2 (two) times daily.    [provider]  lisinopril (PRINIVIL,ZESTRIL) 20 MG tablet Take 20 mg by mouth 2 (two) times daily.    [provider]  metFORMIN (GLUCOPHAGE) 1000 MG tablet Take 1,000 mg by mouth 2 (two) times daily with a meal.    [provider]  pantoprazole (PROTONIX) 40 MG tablet Take 1 tablet (40 mg total) by mouth 2 (two) times daily. 03/31/19   Cirigliano, Vito V, DO  propranolol (INDERAL) 40 MG tablet Take 40 mg by mouth 2 (two) times daily.     [provider]  spironolactone (ALDACTONE) 25 MG tablet Take 1 tablet (25 mg total) by mouth daily. 12/27/19   Cirigliano, Vito V, DO    Allergies    Nadolol  Review of Systems   Review of Systems  Constitutional: Positive for appetite change, fatigue and fever.  HENT: Negative for congestion and rhinorrhea.   Respiratory: Negative for cough, chest tightness and shortness of breath.   Gastrointestinal: Positive for abdominal pain and nausea. Negative for vomiting.  Genitourinary: Negative for  dysuria and hematuria.  Musculoskeletal: Positive for arthralgias and myalgias.  Skin: Negative for rash.  Neurological: Positive for weakness. Negative for dizziness and headaches.   all other systems are negative except as noted in the HPI and PMH.    Physical Exam Updated Vital Signs BP (!) 99/53 (BP Location: Right Arm)   Pulse 100   Temp 98.4 F (36.9 C) (Oral)   Resp 18   Ht 6' 1"  (1.854 m)   Wt 106.6 kg   SpO2 99%   BMI 31.00 kg/m   Physical Exam Vitals and nursing note reviewed.  Constitutional:      General: He is not in acute distress.    Appearance: He is well-developed.     Comments: Chronically ill-appearing  HENT:     Head: Normocephalic and atraumatic.     Mouth/Throat:     Pharynx: No oropharyngeal exudate.  Eyes:     Conjunctiva/sclera: Conjunctivae normal.     Pupils: Pupils are  equal, round, and reactive to light.  Neck:     Comments: No meningismus. Cardiovascular:     Rate and Rhythm: Normal rate and regular rhythm.     Heart sounds: Normal heart sounds. No murmur.  Pulmonary:     Effort: Pulmonary effort is normal. No respiratory distress.     Breath sounds: Normal breath sounds.  Abdominal:     Palpations: Abdomen is soft.     Tenderness: There is abdominal tenderness. There is guarding. There is no rebound.     Comments: Diffuse tenderness, distended abdomen, voluntary guarding throughout  Musculoskeletal:        General: No tenderness. Normal range of motion.     Cervical back: Normal range of motion and neck supple.  Skin:    General: Skin is warm.  Neurological:     Mental Status: He is alert and oriented to person, place, and time.     Cranial Nerves: No cranial nerve deficit.     Motor: No abnormal muscle tone.     Coordination: Coordination normal.     Comments: No ataxia on finger to nose bilaterally. No pronator drift. 5/5 strength throughout. CN 2-12 intact.Equal grip strength. Sensation intact.   Psychiatric:        Behavior:  Behavior normal.     ED Results / Procedures / Treatments   Labs (all labs ordered are listed, but only abnormal results are displayed) Labs Reviewed  COMPREHENSIVE METABOLIC PANEL - Abnormal; Notable for the following components:      Result Value   Potassium 5.4 (*)    CO2 21 (*)    BUN 47 (*)    Creatinine, Ser 1.95 (*)    Calcium 8.6 (*)    GFR calc non Af Amer 38 (*)    GFR calc Af Amer 45 (*)    All other components within normal limits  CBC WITH DIFFERENTIAL/PLATELET - Abnormal; Notable for the following components:   RBC 1.93 (*)    Hemoglobin 6.1 (*)    HCT 19.5 (*)    MCV 101.0 (*)    Platelets 93 (*)    Lymphs Abs 0.6 (*)    All other components within normal limits  APTT - Abnormal; Notable for the following components:   aPTT 43 (*)    All other components within normal limits  PROTIME-INR - Abnormal; Notable for the following components:   Prothrombin Time 18.1 (*)    INR 1.5 (*)    All other components within normal limits  POC OCCULT BLOOD, ED - Abnormal; Notable for the following components:   Fecal Occult Bld POSITIVE (*)    All other components within normal limits  CULTURE, BLOOD (ROUTINE X 2)  CULTURE, BLOOD (ROUTINE X 2)  URINE CULTURE  RESPIRATORY PANEL BY RT PCR (FLU A&B, COVID)  LACTIC ACID, PLASMA  URINALYSIS, ROUTINE W REFLEX MICROSCOPIC  HIV ANTIBODY (ROUTINE TESTING W REFLEX)  VITAMIN B12  FOLATE  IRON AND TIBC  FERRITIN  RETICULOCYTES  HEMOGLOBIN A1C  TYPE AND SCREEN  PREPARE RBC (CROSSMATCH)    EKG None  Radiology IR Paracentesis  Result Date: 01/23/2020 INDICATION: Abdominal distention. Recurrent ascites. Request for therapeutic and diagnostic paracentesis. EXAM: ULTRASOUND GUIDED RIGHT LOWER QUADRANT PARACENTESIS MEDICATIONS: None. COMPLICATIONS: None immediate. PROCEDURE: Informed written consent was obtained from the patient after a discussion of the risks, benefits and alternatives to treatment. A timeout was performed  prior to the initiation of the procedure. Initial ultrasound scanning demonstrates a large amount  of ascites within the right lower abdominal quadrant. The right lower abdomen was prepped and draped in the usual sterile fashion. 1% lidocaine was used for local anesthesia. Following this, a 19 gauge, 7-cm, Yueh catheter was introduced. An ultrasound image was saved for documentation purposes. The paracentesis was performed. The catheter was removed and a dressing was applied. The patient tolerated the procedure well without immediate post procedural complication. FINDINGS: A total of approximately 11.1 L of hazy yellow fluid was removed. Samples were sent to the laboratory as requested by the clinical team. IMPRESSION: Successful ultrasound-guided paracentesis yielding 11.1 liters of peritoneal fluid. Read by: Ascencion Dike PA-C Electronically Signed   By: Corrie Mckusick D.O.   On: 01/23/2020 10:11    Procedures .Critical Care Performed by: Ezequiel Essex, MD Authorized by: Ezequiel Essex, MD   Critical care provider statement:    Critical care time (minutes):  45   Critical care was necessary to treat or prevent imminent or life-threatening deterioration of the following conditions:  Shock   Critical care was time spent personally by me on the following activities:  Discussions with consultants, evaluation of patient's response to treatment, examination of patient, ordering and performing treatments and interventions, ordering and review of laboratory studies, ordering and review of radiographic studies, pulse oximetry, re-evaluation of patient's condition, obtaining history from patient or surrogate and review of old charts   (including critical care time)  Medications Ordered in ED Medications - No data to display  ED Course  I have reviewed the triage vital signs and the nursing notes.  Pertinent labs & imaging results that were available during my care of the patient were reviewed by me  and considered in my medical decision making (see chart for details).    MDM Rules/Calculators/A&P                      Patient sent by GI for SBP.  He continues to complain of diffuse abdominal pain and subjective fever and nausea.  SBP results do show over 2000 white blood cells with negative Gram stain.  Will be started on IV antibiotics  Labs show profound anemia of 6.1 with his baseline in the 11 range.  He denies any recent dark or bloody stools or hematemesis. FOBT positive. Has had banding in the past.   Patient states he does not want a blood transfusion because he "does not trust them". States he will "think about it."  IV PPI, IV albumin. Recommend packed RBCs which patient is considering.  BP improving to 90s.  Admission d/w Dr. Marlowe Sax.  She was able to convince patient to agree to blood transfusion.  Final Clinical Impression(s) / ED Diagnoses Final diagnoses:  None    Rx / DC Orders ED Discharge Orders    None       Tensley Wery, Annie Main, MD 01/24/20 [phone]

## 2020-01-24 ENCOUNTER — Ambulatory Visit (HOSPITAL_COMMUNITY): Payer: BC Managed Care – PPO

## 2020-01-24 ENCOUNTER — Encounter (HOSPITAL_COMMUNITY): Payer: Self-pay | Admitting: Internal Medicine

## 2020-01-24 DIAGNOSIS — I959 Hypotension, unspecified: Secondary | ICD-10-CM | POA: Diagnosis not present

## 2020-01-24 DIAGNOSIS — I10 Essential (primary) hypertension: Secondary | ICD-10-CM | POA: Diagnosis present

## 2020-01-24 DIAGNOSIS — B954 Other streptococcus as the cause of diseases classified elsewhere: Secondary | ICD-10-CM | POA: Diagnosis present

## 2020-01-24 DIAGNOSIS — R188 Other ascites: Secondary | ICD-10-CM | POA: Diagnosis present

## 2020-01-24 DIAGNOSIS — K7290 Hepatic failure, unspecified without coma: Secondary | ICD-10-CM | POA: Diagnosis present

## 2020-01-24 DIAGNOSIS — I8510 Secondary esophageal varices without bleeding: Secondary | ICD-10-CM | POA: Diagnosis present

## 2020-01-24 DIAGNOSIS — K7581 Nonalcoholic steatohepatitis (NASH): Secondary | ICD-10-CM | POA: Diagnosis present

## 2020-01-24 DIAGNOSIS — K219 Gastro-esophageal reflux disease without esophagitis: Secondary | ICD-10-CM | POA: Diagnosis present

## 2020-01-24 DIAGNOSIS — K7460 Unspecified cirrhosis of liver: Secondary | ICD-10-CM | POA: Diagnosis present

## 2020-01-24 DIAGNOSIS — N179 Acute kidney failure, unspecified: Secondary | ICD-10-CM

## 2020-01-24 DIAGNOSIS — E11649 Type 2 diabetes mellitus with hypoglycemia without coma: Secondary | ICD-10-CM | POA: Diagnosis not present

## 2020-01-24 DIAGNOSIS — D638 Anemia in other chronic diseases classified elsewhere: Secondary | ICD-10-CM | POA: Diagnosis not present

## 2020-01-24 DIAGNOSIS — R1310 Dysphagia, unspecified: Secondary | ICD-10-CM | POA: Diagnosis present

## 2020-01-24 DIAGNOSIS — K766 Portal hypertension: Secondary | ICD-10-CM | POA: Diagnosis present

## 2020-01-24 DIAGNOSIS — R600 Localized edema: Secondary | ICD-10-CM | POA: Diagnosis present

## 2020-01-24 DIAGNOSIS — I8500 Esophageal varices without bleeding: Secondary | ICD-10-CM | POA: Diagnosis not present

## 2020-01-24 DIAGNOSIS — E119 Type 2 diabetes mellitus without complications: Secondary | ICD-10-CM | POA: Diagnosis not present

## 2020-01-24 DIAGNOSIS — K2970 Gastritis, unspecified, without bleeding: Secondary | ICD-10-CM | POA: Diagnosis present

## 2020-01-24 DIAGNOSIS — Z20822 Contact with and (suspected) exposure to covid-19: Secondary | ICD-10-CM | POA: Diagnosis present

## 2020-01-24 DIAGNOSIS — D61818 Other pancytopenia: Secondary | ICD-10-CM | POA: Diagnosis present

## 2020-01-24 DIAGNOSIS — D649 Anemia, unspecified: Secondary | ICD-10-CM

## 2020-01-24 DIAGNOSIS — K652 Spontaneous bacterial peritonitis: Principal | ICD-10-CM

## 2020-01-24 DIAGNOSIS — Z888 Allergy status to other drugs, medicaments and biological substances status: Secondary | ICD-10-CM | POA: Diagnosis not present

## 2020-01-24 DIAGNOSIS — Z7984 Long term (current) use of oral hypoglycemic drugs: Secondary | ICD-10-CM | POA: Diagnosis not present

## 2020-01-24 DIAGNOSIS — K3189 Other diseases of stomach and duodenum: Secondary | ICD-10-CM

## 2020-01-24 DIAGNOSIS — Z79899 Other long term (current) drug therapy: Secondary | ICD-10-CM | POA: Diagnosis not present

## 2020-01-24 DIAGNOSIS — E785 Hyperlipidemia, unspecified: Secondary | ICD-10-CM | POA: Diagnosis present

## 2020-01-24 DIAGNOSIS — Z833 Family history of diabetes mellitus: Secondary | ICD-10-CM | POA: Diagnosis not present

## 2020-01-24 LAB — BASIC METABOLIC PANEL
Anion gap: 7 (ref 5–15)
BUN: 36 mg/dL — ABNORMAL HIGH (ref 6–20)
CO2: 19 mmol/L — ABNORMAL LOW (ref 22–32)
Calcium: 7.8 mg/dL — ABNORMAL LOW (ref 8.9–10.3)
Chloride: 111 mmol/L (ref 98–111)
Creatinine, Ser: 1.51 mg/dL — ABNORMAL HIGH (ref 0.61–1.24)
GFR calc Af Amer: >60 mL/min (ref >60)
GFR calc non Af Amer: 52 mL/min — ABNORMAL LOW (ref >60)
Glucose, Bld: 142 mg/dL — ABNORMAL HIGH (ref 70–99)
Potassium: 4.3 mmol/L (ref 3.5–5.1)
Sodium: 137 mmol/L (ref 135–145)

## 2020-01-24 LAB — CBG MONITORING, ED
Glucose-Capillary: 31 mg/dL — CL (ref 70–99)
Glucose-Capillary: 34 mg/dL — CL (ref 70–99)
Glucose-Capillary: 76 mg/dL (ref 70–99)
Glucose-Capillary: 92 mg/dL (ref 70–99)
Glucose-Capillary: 53 mg/dL — ABNORMAL LOW (ref 70–99)
Glucose-Capillary: 86 mg/dL (ref 70–99)
Glucose-Capillary: 51 mg/dL — ABNORMAL LOW (ref 70–99)
Glucose-Capillary: 72 mg/dL (ref 70–99)
Glucose-Capillary: 41 mg/dL — CL (ref 70–99)
Glucose-Capillary: 83 mg/dL (ref 70–99)
Glucose-Capillary: 94 mg/dL (ref 70–99)
Glucose-Capillary: 58 mg/dL — ABNORMAL LOW (ref 70–99)
Glucose-Capillary: 43 mg/dL — CL (ref 70–99)
Glucose-Capillary: 141 mg/dL — ABNORMAL HIGH (ref 70–99)

## 2020-01-24 LAB — URINALYSIS, ROUTINE W REFLEX MICROSCOPIC
Bacteria, UA: NONE SEEN
Bilirubin Urine: NEGATIVE
Glucose, UA: >=500 mg/dL — AB
Hgb urine dipstick: NEGATIVE
Ketones, ur: NEGATIVE mg/dL
Leukocytes,Ua: NEGATIVE
Nitrite: NEGATIVE
Protein, ur: NEGATIVE mg/dL
Specific Gravity, Urine: 1.016 (ref 1.005–1.030)
pH: 5 (ref 5.0–8.0)

## 2020-01-24 LAB — RESPIRATORY PANEL BY RT PCR (FLU A&B, COVID)
Influenza A by PCR: NEGATIVE
Influenza B by PCR: NEGATIVE
SARS Coronavirus 2 by RT PCR: NEGATIVE

## 2020-01-24 LAB — IRON AND TIBC
Iron: 10 ug/dL — ABNORMAL LOW (ref 45–182)
Saturation Ratios: 5 % — ABNORMAL LOW (ref 17.9–39.5)
TIBC: 218 ug/dL — ABNORMAL LOW (ref 250–450)
UIBC: 208 ug/dL

## 2020-01-24 LAB — FERRITIN: Ferritin: 30 ng/mL (ref 24–336)

## 2020-01-24 LAB — POC OCCULT BLOOD, ED: Fecal Occult Bld: POSITIVE — AB

## 2020-01-24 LAB — RETICULOCYTES
Immature Retic Fract: 12.3 % (ref 2.3–15.9)
RBC.: 1.81 MIL/uL — ABNORMAL LOW (ref 4.22–5.81)
Retic Count, Absolute: 38.7 10*3/uL (ref 19.0–186.0)
Retic Ct Pct: 2.1 % (ref 0.4–3.1)

## 2020-01-24 LAB — HEMOGLOBIN AND HEMATOCRIT, BLOOD
HCT: 19.5 % — ABNORMAL LOW (ref 39.0–52.0)
Hemoglobin: 6.1 g/dL — CL (ref 13.0–17.0)

## 2020-01-24 LAB — ABO/RH: ABO/RH(D): A POS

## 2020-01-24 LAB — VITAMIN B12: Vitamin B-12: 711 pg/mL (ref 180–914)

## 2020-01-24 LAB — PREPARE RBC (CROSSMATCH)

## 2020-01-24 LAB — HIV ANTIBODY (ROUTINE TESTING W REFLEX): HIV Screen 4th Generation wRfx: NONREACTIVE

## 2020-01-24 LAB — FOLATE: Folate: 17.5 ng/mL (ref >5.9)

## 2020-01-24 MED ORDER — DEXTROSE 50 % IV SOLN
INTRAVENOUS | Status: AC
  Administered 2020-01-24: 50 mL via INTRAVENOUS
  Filled 2020-01-24: qty 50

## 2020-01-24 MED ORDER — DEXTROSE 50 % IV SOLN
INTRAVENOUS | Status: AC
  Filled 2020-01-24: qty 50

## 2020-01-24 MED ORDER — DEXTROSE 50 % IV SOLN: 50.0000 mL | Freq: Once | INTRAVENOUS | Status: AC

## 2020-01-24 MED ORDER — SODIUM CHLORIDE 0.9 % IV SOLN
2.0000 g | Freq: Three times a day (TID) | INTRAVENOUS | Status: DC
  Administered 2020-01-24 – 2020-01-25 (×4): 2 g via INTRAVENOUS
  Filled 2020-01-24 (×5): qty 2

## 2020-01-24 MED ORDER — DEXTROSE 50 % IV SOLN
INTRAVENOUS | Status: AC
  Administered 2020-01-24: 50 mL
  Filled 2020-01-24: qty 50

## 2020-01-24 MED ORDER — ALBUMIN HUMAN 25 % IV SOLN
50.0000 g | Freq: Three times a day (TID) | INTRAVENOUS | Status: DC
  Administered 2020-01-24: 12.5 g via INTRAVENOUS
  Administered 2020-01-25: 50 g via INTRAVENOUS
  Filled 2020-01-24 (×5): qty 200

## 2020-01-24 MED ORDER — ACETAMINOPHEN 325 MG PO TABS: 650.0000 mg | ORAL_TABLET | Freq: Four times a day (QID) | ORAL | Status: DC | PRN

## 2020-01-24 MED ORDER — DEXTROSE 50 % IV SOLN
50.0000 mL | Freq: Once | INTRAVENOUS | Status: AC
  Administered 2020-01-24: 50 mL via INTRAVENOUS
  Filled 2020-01-24: qty 50

## 2020-01-24 MED ORDER — ALBUMIN HUMAN 25 % IV SOLN
12.5000 g | Freq: Once | INTRAVENOUS | Status: AC
  Administered 2020-01-24: 12.5 g via INTRAVENOUS
  Filled 2020-01-24: qty 50

## 2020-01-24 MED ORDER — DEXTROSE 50 % IV SOLN
50.0000 mL | Freq: Once | INTRAVENOUS | Status: AC
  Administered 2020-01-24: 50 mL via INTRAVENOUS

## 2020-01-24 MED ORDER — SODIUM CHLORIDE 0.9 % IV SOLN
80.0000 mg | Freq: Once | INTRAVENOUS | Status: AC
  Administered 2020-01-24: 80 mg via INTRAVENOUS
  Filled 2020-01-24: qty 80

## 2020-01-24 MED ORDER — SODIUM CHLORIDE 0.9% IV SOLUTION: Freq: Once | INTRAVENOUS | Status: DC

## 2020-01-24 MED ORDER — SODIUM CHLORIDE 0.9 % IV BOLUS
1000.0000 mL | Freq: Once | INTRAVENOUS | Status: AC
  Administered 2020-01-24: 1000 mL via INTRAVENOUS

## 2020-01-24 MED ORDER — DEXTROSE 5 % IV SOLN: Freq: Once | INTRAVENOUS | Status: AC

## 2020-01-24 MED ORDER — MORPHINE SULFATE (PF) 2 MG/ML IV SOLN: 1.0000 mg | INTRAVENOUS | Status: DC | PRN

## 2020-01-24 MED ORDER — DEXTROSE 5 % IV SOLN
Freq: Once | INTRAVENOUS | Status: AC
  Filled 2020-01-24: qty 1000

## 2020-01-24 MED ORDER — INSULIN ASPART 100 UNIT/ML ~~LOC~~ SOLN
0.0000 [IU] | SUBCUTANEOUS | Status: DC
  Administered 2020-01-25 – 2020-01-26 (×2): 2 [IU] via SUBCUTANEOUS
  Administered 2020-01-26: 1 [IU] via SUBCUTANEOUS
  Administered 2020-01-26: 2 [IU] via SUBCUTANEOUS
  Administered 2020-01-26 – 2020-01-27 (×5): 3 [IU] via SUBCUTANEOUS
  Administered 2020-01-27 (×2): 2 [IU] via SUBCUTANEOUS
  Filled 2020-01-24: qty 0.09

## 2020-01-24 MED ORDER — PANTOPRAZOLE SODIUM 40 MG IV SOLR: 40.0000 mg | Freq: Two times a day (BID) | INTRAVENOUS | Status: DC

## 2020-01-24 MED ORDER — PANTOPRAZOLE SODIUM 40 MG IV SOLR
40.0000 mg | INTRAVENOUS | Status: DC
  Administered 2020-01-24: 40 mg via INTRAVENOUS
  Filled 2020-01-24: qty 40

## 2020-01-24 MED ORDER — ONDANSETRON HCL 4 MG/2ML IJ SOLN: 4.0000 mg | Freq: Four times a day (QID) | INTRAMUSCULAR | Status: DC | PRN

## 2020-01-24 MED ORDER — ACETAMINOPHEN 650 MG RE SUPP: 650.0000 mg | Freq: Four times a day (QID) | RECTAL | Status: DC | PRN

## 2020-01-24 MED ORDER — SODIUM CHLORIDE 0.9 % IV SOLN
8.0000 mg/h | INTRAVENOUS | Status: DC
  Administered 2020-01-24: 8 mg/h via INTRAVENOUS
  Filled 2020-01-24 (×2): qty 80

## 2020-01-24 MED ORDER — ALBUMIN HUMAN 25 % IV SOLN
50.0000 g | Freq: Once | INTRAVENOUS | Status: AC
  Administered 2020-01-24: 50 g via INTRAVENOUS
  Filled 2020-01-24: qty 200

## 2020-01-24 NOTE — Progress Notes (Signed)
Pharmacy Antibiotic Note  Martin Strong is a 54 y.o. male admitted on 01/23/2020 with SBP.  Pharmacy has been consulted for Cefepime dosing.  Plan: Cefepime 2gm iv q8hr  Height: 6' 1"  (185.4 cm) Weight: 235 lb (106.6 kg) IBW/kg (Calculated) : 79.9  Temp (24hrs), Avg:98.4 F (36.9 C), Min:98.4 F (36.9 C), Max:98.4 F (36.9 C)  Recent Labs  Lab 01/23/20 2008 01/23/20 2100  WBC 6.5  --   CREATININE 1.95*  --   LATICACIDVEN  --  1.0    Estimated Creatinine Clearance: 56.8 mL/min (A) (by C-G formula based on SCr of 1.95 mg/dL (H)).    Allergies  Allergen Reactions  . Nadolol Itching and Nausea Only    Antimicrobials this admission: Cefepime 01/23/20 >>  Dose adjustments this admission: -  Microbiology results: -  Thank you for allowing pharmacy to be a part of this patient's care.  Nani Skillern Crowford 01/24/2020 1:47 AM

## 2020-01-24 NOTE — ED Provider Notes (Signed)
Patient became hypoglycemic, glucose 35.  He is given intravenous dextrose.  He is currently n.p.o., so he started on a dextrose infusion.   Delora Fuel, MD 16/24/46 [phone]

## 2020-01-24 NOTE — Consult Note (Addendum)
Referring Provider:  [facility]         Primary Care Physician:  [name], NP Primary Gastroenterologist:  Gerrit Heck, MD            We were asked to see this patient for:   Cirrhosis / SBP               ASSESSMENT /  PLAN    1. 54 yo male with decompensated NASH cirrhosis requiring serial paracentesis. Diuresis has been complicated by repeated kidney injury. Now with SBP ( gram positive cocci in chains) and AKI.  -Currently on Maxipime -await final fluid culture -continue to hold diuretics -Beta blocker on hold.  -Needs additional IV albumin. Will start 50 grams Q 8 hours -am BMET  2. Anemia. Drop in hgb from 11.8 in October to 6.1. FOBT+ ( not surprising). Got a unit of blood this am.  -Will stop PPI drip, not acutely bleeding -check post transfusion H+H   HPI:    Chief Complaint: cirrhosis, abdominal pain History comes from patient and chart  Martin Strong is a 54 y.o. male with DM2, HTN, NASH cirrhosis / history of recurrent variceal bleeding requiring multiple EGDs with banding ( previously followed in [place]).  treated with EVL. Developed large volume ascites in October 2020. He is diuretic intolerant due to repeated AKI , was to see Nephrology late January for this but missed appointment due to diarrhea. He has required serial LVP , low dose diuretics and low sodium diet. . Followed by [place] as well as [facility]. He has been undergoing evaluation for TIPS by IR. Echo last week >> grade I diastolic dysfunction  / EF 60-65%.   Patient developed diffuse abdominal pain last week. Never had that type of pain before. Also had decreased desire to eat with the abdominal distention. Was supposed to have LVP today but called office and asked to have earlier. Had an 11 liter LVP yesterday, cell count concerning for SBP, patient sent to ED. He has been afebrile, no leukocytosis. He did have an episode of hypotension, given dose of albumin and small  amount of IVF in ED. Abdominal pain is much better today.   Patient endorse intermittent diarrhea lately. Missed Nephrology appt late January due to diarrhea. Had more diarrhea this past Monday but bowel movements back to normal now. Says generally if he goes for a while without eating (when has abdominal distention) will get diarrhea with starts to eat again. He hasn't seen any blood in stools / no black stools.    Ascitic fluid cell count 01/23/20 Total Nucleated cells 2480, Neut count 77%.  Culture>> gram positive cocci in chains.   Past Medical History:  Diagnosis Date  . Anemia   . Arthritis   . Cirrhosis ([place])   . Diabetes mellitus without complication ([place])   . Fatty liver   . GERD (gastroesophageal reflux disease)   . GI bleed   . Hyperlipidemia   . Hypertension     Past Surgical History:  Procedure Laterality Date  . BIOPSY  02/14/2019   Procedure: BIOPSY;  Surgeon: Lavena Bullion, DO;  Location: [facility];  Service: Gastroenterology;;  . BIOPSY  07/22/2019   Procedure: BIOPSY;  Surgeon: Lavena Bullion, DO;  Location: [facility];  Service: Gastroenterology;;  . ESOPHAGEAL BANDING  10/07/2018   Procedure: ESOPHAGEAL BANDING;  Surgeon: Lavena Bullion, DO;  Location: [place];  Service: Gastroenterology;;  . ESOPHAGEAL BANDING N/A  01/03/2019   Procedure: ESOPHAGEAL BANDING;  Surgeon: Lavena Bullion, DO;  Location: [facility];  Service: Gastroenterology;  Laterality: N/A;  . ESOPHAGEAL BANDING  02/14/2019   Procedure: ESOPHAGEAL BANDING;  Surgeon: Lavena Bullion, DO;  Location: [facility];  Service: Gastroenterology;;  . esophageal bands    . ESOPHAGOGASTRODUODENOSCOPY Left 07/22/2019   Procedure: ESOPHAGOGASTRODUODENOSCOPY (EGD) WITH POSSIBLE BANDING;  Surgeon: Lavena Bullion, DO;  Location: [facility];  Service: Gastroenterology;  Laterality: Left;  . ESOPHAGOGASTRODUODENOSCOPY (EGD) WITH PROPOFOL N/A 10/07/2018   Procedure:  ESOPHAGOGASTRODUODENOSCOPY (EGD) WITH PROPOFOL;  Surgeon: Lavena Bullion, DO;  Location: [place];  Service: Gastroenterology;  Laterality: N/A;  . ESOPHAGOGASTRODUODENOSCOPY (EGD) WITH PROPOFOL N/A 01/03/2019   Procedure: ESOPHAGOGASTRODUODENOSCOPY (EGD) WITH PROPOFOL;  Surgeon: Lavena Bullion, DO;  Location: [facility];  Service: Gastroenterology;  Laterality: N/A;  . ESOPHAGOGASTRODUODENOSCOPY (EGD) WITH PROPOFOL N/A 02/14/2019   Procedure: ESOPHAGOGASTRODUODENOSCOPY (EGD) WITH PROPOFOL;  Surgeon: Lavena Bullion, DO;  Location: [facility];  Service: Gastroenterology;  Laterality: N/A;  . IR PARACENTESIS  10/05/2019  . IR PARACENTESIS  10/24/2019  . IR PARACENTESIS  11/16/2019  . IR PARACENTESIS  12/25/2019  . IR PARACENTESIS  01/05/2020  . IR PARACENTESIS  01/23/2020  . IR RADIOLOGIST EVAL & MGMT  12/19/2019    Prior to Admission medications   Medication Sig Start Date End Date Taking? Authorizing Provider  cholecalciferol (VITAMIN D) 1000 units tablet Take 1,000 Units by mouth 2 (two) times daily.   Yes [provider]  dapagliflozin propanediol (FARXIGA) 10 MG TABS tablet Take 10 mg by mouth every evening.    Yes [provider]  ferrous sulfate (KP FERROUS SULFATE) 325 (65 FE) MG tablet Take 1 tablet (325 mg total) by mouth daily with breakfast. Patient taking differently: Take 325 mg by mouth 2 (two) times daily.  05/30/19  Yes Cirigliano, Vito V, DO  furosemide (LASIX) 20 MG tablet Take 1 tablet (20 mg total) by mouth daily. 12/27/19  Yes Cirigliano, Vito V, DO  glipiZIDE (GLUCOTROL) 10 MG tablet Take 10 mg by mouth 2 (two) times daily.   Yes [provider]  lisinopril (PRINIVIL,ZESTRIL) 20 MG tablet Take 20 mg by mouth 2 (two) times daily.   Yes [provider]  metFORMIN (GLUCOPHAGE) 1000 MG tablet Take 1,000 mg by mouth 2 (two) times daily with a meal.   Yes [provider]  Multiple Vitamin (MULTIVITAMIN) tablet Take 1 tablet  by mouth daily.   Yes [provider]  pantoprazole (PROTONIX) 40 MG tablet Take 1 tablet (40 mg total) by mouth 2 (two) times daily. 03/31/19  Yes Cirigliano, Vito V, DO  propranolol (INDERAL) 40 MG tablet Take 40 mg by mouth 2 (two) times daily.    Yes [provider]  spironolactone (ALDACTONE) 25 MG tablet Take 1 tablet (25 mg total) by mouth daily. 12/27/19  Yes Cirigliano, Vito V, DO    Current Facility-Administered Medications  Medication Dose Route Frequency Provider Last Rate Last Admin  . 0.9 %  sodium chloride infusion (Manually program via Guardrails IV Fluids)   Intravenous Once Shela Leff, MD      . acetaminophen (TYLENOL) tablet 650 mg  650 mg Oral Q6H PRN Shela Leff, MD       Or  . acetaminophen (TYLENOL) suppository 650 mg  650 mg Rectal Q6H PRN Shela Leff, MD      . ceFEPIme (MAXIPIME) 2 g in sodium chloride 0.9 % 100 mL IVPB  2 g  Intravenous Q8H Shela Leff, MD      . insulin aspart (novoLOG) injection 0-9 Units  0-9 Units Subcutaneous Q4H Shela Leff, MD      . ondansetron (ZOFRAN) injection 4 mg  4 mg Intravenous Q6H PRN Shela Leff, MD      . pantoprazole (PROTONIX) 80 mg in sodium chloride 0.9 % 250 mL (0.32 mg/mL) infusion  8 mg/hr Intravenous Continuous Shela Leff, MD 25 mL/hr at 01/24/20 0230 8 mg/hr at 01/24/20 0230  . [START ON 01/27/2020] pantoprazole (PROTONIX) injection 40 mg  40 mg Intravenous Q12H Shela Leff, MD       Current Outpatient Medications  Medication Sig Dispense Refill  . cholecalciferol (VITAMIN D) 1000 units tablet Take 1,000 Units by mouth 2 (two) times daily.    . dapagliflozin propanediol (FARXIGA) 10 MG TABS tablet Take 10 mg by mouth every evening.     . ferrous sulfate (KP FERROUS SULFATE) 325 (65 FE) MG tablet Take 1 tablet (325 mg total) by mouth daily with breakfast. (Patient taking differently: Take 325 mg by mouth 2 (two) times daily. ) 30 tablet 3  . furosemide  (LASIX) 20 MG tablet Take 1 tablet (20 mg total) by mouth daily. 90 tablet 0  . glipiZIDE (GLUCOTROL) 10 MG tablet Take 10 mg by mouth 2 (two) times daily.    Marland Kitchen lisinopril (PRINIVIL,ZESTRIL) 20 MG tablet Take 20 mg by mouth 2 (two) times daily.    . metFORMIN (GLUCOPHAGE) 1000 MG tablet Take 1,000 mg by mouth 2 (two) times daily with a meal.    . Multiple Vitamin (MULTIVITAMIN) tablet Take 1 tablet by mouth daily.    . pantoprazole (PROTONIX) 40 MG tablet Take 1 tablet (40 mg total) by mouth 2 (two) times daily. 60 tablet 3  . propranolol (INDERAL) 40 MG tablet Take 40 mg by mouth 2 (two) times daily.     Marland Kitchen spironolactone (ALDACTONE) 25 MG tablet Take 1 tablet (25 mg total) by mouth daily. 30 tablet 0    Allergies as of 01/23/2020 - Review Complete 01/23/2020  Allergen Reaction Noted  . Nadolol Itching and Nausea Only 10/06/2018    Family History  Problem Relation Age of Onset  . Diabetes Other   . Esophageal cancer Father   . Colon cancer Neg Hx   . Rectal cancer Neg Hx   . Stomach cancer Neg Hx     Social History   Socioeconomic History  . Marital status: Married    Spouse name: Not on file  . Number of children: 2  . Years of education: Not on file  . Highest education level: Not on file  Occupational History  . Not on file  Tobacco Use  . Smoking status: Never Smoker  . Smokeless tobacco: Never Used  Substance and Sexual Activity  . Alcohol use: Never  . Drug use: Never  . Sexual activity: Not on file  Other Topics Concern  . Not on file  Social History Narrative  . Not on file   Social Determinants of Health   Financial Resource Strain:   . Difficulty of Paying Living Expenses: Not on file  Food Insecurity:   . Worried About Charity fundraiser in the Last Year: Not on file  . Ran Out of Food in the Last Year: Not on file  Transportation Needs:   . Lack of Transportation (Medical): Not on file  . Lack of Transportation (Non-Medical): Not on file  Physical  Activity:   . Days  of Exercise per Week: Not on file  . Minutes of Exercise per Session: Not on file  Stress:   . Feeling of Stress : Not on file  Social Connections:   . Frequency of Communication with Friends and Family: Not on file  . Frequency of Social Gatherings with Friends and Family: Not on file  . Attends Religious Services: Not on file  . Active Member of Clubs or Organizations: Not on file  . Attends Archivist Meetings: Not on file  . Marital Status: Not on file  Intimate Partner Violence:   . Fear of Current or Ex-Partner: Not on file  . Emotionally Abused: Not on file  . Physically Abused: Not on file  . Sexually Abused: Not on file    Review of Systems: All systems reviewed and negative except where noted in HPI.  Physical Exam: Vital signs in last 24 hours: Temp:  [98.3 F (36.8 C)-98.7 F (37.1 C)] 98.3 F (36.8 C) (02/10 0745) Pulse Rate:  [76-100] 76 (02/10 0745) Resp:  [14-22] 14 (02/10 0745) BP: (83-116)/(33-67) 101/54 (02/10 0745) SpO2:  [97 %-100 %] 100 % (02/10 0745) Weight:  [106.6 kg] 106.6 kg (02/09 1906)   General:   Alert male in NAD Psych:  Pleasant, cooperative. Normal mood and affect. Eyes:  Pupils equal, sclera clear, no icterus.   Conjunctiva pink. Ears:  Normal auditory acuity. Nose:  No deformity, discharge,  or lesions. Neck:  Supple; no masses Lungs:  Clear throughout to auscultation.   No wheezes, crackles, or rhonchi.  Heart:  Regular rate and rhythm; soft murmur, no lower extremity edema Abdomen:  Soft, distended, mild diffuse tenderness. BS active, no palp mass   Rectal:  Deferred  Msk:  Symmetrical without gross deformities. . Neurologic:  Alert and  oriented x4;  grossly normal neurologically. Skin:  Intact without significant lesions or rashes.   Intake/Output from previous day: 02/09 0701 - 02/10 0700 In: 1000 [IV Piggyback:1000] Out: -  Intake/Output this shift: No intake/output data recorded.  Lab  Results: Recent Labs    01/23/20 2008  WBC 6.5  HGB 6.1*  HCT 19.5*  PLT 93*   BMET Recent Labs    01/23/20 2008  NA 137  K 5.4*  CL 109  CO2 21*  GLUCOSE 86  BUN 47*  CREATININE 1.95*  CALCIUM 8.6*   LFT Recent Labs    01/23/20 2008  PROT 7.0  ALBUMIN 3.7  AST 25  ALT 23  ALKPHOS 55  BILITOT 0.6   PT/INR Recent Labs    01/23/20 1938  LABPROT 18.1*  INR 1.5*   Hepatitis Panel No results for input(s): HEPBSAG, HCVAB, HEPAIGM, HEPBIGM in the last 72 hours.   . CBC Latest Ref Rng & Units 01/23/2020 10/02/2019 07/22/2019  WBC 4.0 - 10.5 K/uL 6.5 7.0 4.2  Hemoglobin 13.0 - 17.0 g/dL 6.1(LL) 11.8(L) 11.6(L)  Hematocrit 39.0 - 52.0 % 19.5(L) 35.2(L) 35.0(L)  Platelets 150 - 400 K/uL 93(L) 151.0 47(L)    . CMP Latest Ref Rng & Units 01/23/2020 11/08/2019 10/31/2019  Glucose 70 - 99 mg/dL 86 75 132(H)  BUN 6 - 20 mg/dL 47(H) 23 23  Creatinine 0.61 - 1.24 mg/dL 1.95(H) 1.41 1.24  Sodium 135 - 145 mmol/L 137 137 139  Potassium 3.5 - 5.1 mmol/L 5.4(H) 4.4 4.4  Chloride 98 - 111 mmol/L 109 103 103  CO2 22 - 32 mmol/L 21(L) 24 26  Calcium 8.9 - 10.3 mg/dL 8.6(L) 8.8 9.1  Total  Protein 6.5 - 8.1 g/dL 7.0 - -  Total Bilirubin 0.3 - 1.2 mg/dL 0.6 - -  Alkaline Phos 38 - 126 U/L 55 - -  AST 15 - 41 U/L 25 - -  ALT 0 - 44 U/L 23 - -   Studies/Results: DG Chest 2 View  Result Date: 01/23/2020 CLINICAL DATA:  Abdominal pain. EXAM: CHEST - 2 VIEW COMPARISON:  07/21/2019 FINDINGS: The cardiomediastinal silhouette is unchanged with normal heart size. Mild scarring is again noted in the left lung base. No acute airspace consolidation, edema, pleural effusion, pneumothorax is identified. No acute osseous abnormality is seen. IMPRESSION: No active cardiopulmonary disease. Electronically Signed   By: Logan Bores M.D.   On: 01/23/2020 19:37   IR Paracentesis  Result Date: 01/23/2020 INDICATION: Abdominal distention. Recurrent ascites. Request for therapeutic and diagnostic  paracentesis. EXAM: ULTRASOUND GUIDED RIGHT LOWER QUADRANT PARACENTESIS MEDICATIONS: None. COMPLICATIONS: None immediate. PROCEDURE: Informed written consent was obtained from the patient after a discussion of the risks, benefits and alternatives to treatment. A timeout was performed prior to the initiation of the procedure. Initial ultrasound scanning demonstrates a large amount of ascites within the right lower abdominal quadrant. The right lower abdomen was prepped and draped in the usual sterile fashion. 1% lidocaine was used for local anesthesia. Following this, a 19 gauge, 7-cm, Yueh catheter was introduced. An ultrasound image was saved for documentation purposes. The paracentesis was performed. The catheter was removed and a dressing was applied. The patient tolerated the procedure well without immediate post procedural complication. FINDINGS: A total of approximately 11.1 L of hazy yellow fluid was removed. Samples were sent to the laboratory as requested by the clinical team. IMPRESSION: Successful ultrasound-guided paracentesis yielding 11.1 liters of peritoneal fluid. Read by: Ascencion Dike PA-C Electronically Signed   By: Corrie Mckusick D.O.   On: 01/23/2020 10:11    Principal Problem:   SBP (spontaneous bacterial peritonitis) ([place]) Active Problems:   Cirrhosis ([place])   Diabetes mellitus without complication ([place])   Portal hypertensive gastropathy ([place])   Esophageal varices ([place])    Tye Savoy, NP-C @  01/24/2020, 9:44 AM  I have reviewed the entire case in detail with the above APP and discussed the plan in detail.  Therefore, I agree with the diagnoses recorded above. In addition,  I have personally interviewed and examined the patient and have personally reviewed any abdominal/pelvic CT scan images.  My additional thoughts are as follows:  End-stage liver disease with esophageal varices and prior bleeding, ascites refractory to diuretics, requiring frequent large-volume  paracentesis.  Now complicated by SBP with gram-positive cocci, final culture not yet resulted. Patient seems as if he has an underlying renal sensitivity/type II hepatorenal syndrome, and had an increase in creatinine upon admission.  He received high-dose albumin, and thankfully the creatinine has come down somewhat today.  Additional albumin ordered today to hopefully keep him from developing type I hepatorenal syndrome.  Surprising drop in his hemoglobin of several grams over the last several months.  Elevated MCV, normal B12 and folate.  No overt GI bleeding.  No endoscopic tests planned at present, patient being transfused.  We will follow closely.   Nelida Meuse III Office:[phone]

## 2020-01-24 NOTE — ED Notes (Signed)
Provided pt with meal tray.  Pt advised he was not very hungry and probably would not eat it but requested I leave it in case he changes his mind.

## 2020-01-24 NOTE — Progress Notes (Signed)
PHARMACY NOTE -  cefepime  Pharmacy has been assisting with dosing of cefepime for SBP.  Current dosage is 2g IV q8 hr, although remains borderline for renal adjustment  Pharmacy will sign off, following peripherally for culture results or dose adjustments. Please reconsult if a change in clinical status warrants re-evaluation of dosage.  Reuel Boom, PharmD, BCPS [phone] 01/24/2020, 7:29 AM

## 2020-01-24 NOTE — ED Notes (Signed)
Informed M. Sharlet Salina, MD of [facility] of 43.

## 2020-01-24 NOTE — Progress Notes (Signed)
PROGRESS NOTE    Martin Strong  AGT:[id number] DOB: 1966-05-24 DOA: 01/23/2020 PCP: [name], NP    Brief Narrative:  54 year old gentleman prior history of decompensated liver cirrhosis from [place] with large volume ascites s/p recent paracentesis where 11 L were removed presents to ED with AKI and SBP. Patient has history of recurrent variceal bleeding requiring multiple EGDs with banding, hypertensive gastropathy, diuretic intolerant ascites, anemia, hyperlipidemia, hypertension, non-insulin-dependent diabetes mellitus, mild thrombocytopenia in addition to the above. Ascites fluid analysis shows elevated neutrophil count suspicious for SBP.  He was started on Maxipime and currently waiting for final cultures. Patient's hemoglobin also dropped from 11-6.1. He underwent 1 unit of PRBC transfusion and 2 more units of PRBC ordered. GI consulted and awaiting recommendations.  Assessment & Plan:   Principal Problem:   SBP (spontaneous bacterial peritonitis) ([place]) Active Problems:   Cirrhosis ([place])   Diabetes mellitus without complication (HCC)   Portal hypertensive gastropathy (HCC)   Esophageal varices (HCC)   SBP with hypotension in the setting of decompensated liver cirrhosis from rash with refractory ascites requiring large-volume paracentesis. Patient underwent paracentesis where 11 L were removed on 01/23/2020. Acetic fluid analysis shows 2480 WBCs and gram-negative stain. Patient was started on cefepime for spontaneous bacterial peritonitis.  Further cultures are still pending.  GI consulted and recommendations pending.    Acute anemia of blood loss versus symptomatic anemia in the setting of liver cirrhosis secondary to [facility] Patient's hemoglobin dropped from a baseline around 11-6.1 on admission. FOBT positive but no frank bleeding noticed on rectal exam in ED. S/p 1 unit PRBC and 2 more units are ordered and pending to be given. GI consult to see if he will need  repeat EGD. IV PPI on board.  Portal hypertensive gastropathy Beta-blockers on hold due to hypotension   Chronic thrombocytopenia Continue to monitor.    AKI Probably secondary to hypotension from a aggressive paracentesis. Continue to monitor and repeat renal parameters in the morning. Continue to hold diuretics at this time.    Non-insulin-dependent diabetes A1c is pending Continue with sliding scale insulin.    Hypoglycemia Patient asymptomatic Probably from the glipizide that was taken last night. Continue to monitor.  DVT prophylaxis: SCDs Code Status: Full code Family Communication: (None at bedside   Disposition Plan:  . Patient came from: Home           . Anticipated d/c place: Possibly home . Barriers to d/c OR conditions which need to be met to effect a safe d/c: Pending further evaluation of SBP, not ready for discharge   Consultants:   Gastroenterology  Procedures: None  Antimicrobials: Maxipime since admission.   Subjective: Patient reports he is feeling better than yesterday, his abdominal distention has improved.  Does not feel symptomatic with low blood sugars.  Objective: Vitals:   01/24/20 1345 01/24/20 1400 01/24/20 1415 01/24/20 1430  BP: 116/62 (!) 107/59 110/62 120/66  Pulse: 84 83 82 83  Resp: 16 12 15 14   Temp:      TempSrc:      SpO2: 100% 100% 100% 100%  Weight:      Height:        Intake/Output Summary (Last 24 hours) at 01/24/2020 1444 Last data filed at 01/24/2020 1113 Gross per 24 hour  Intake 1624.58 ml  Output --  Net 1624.58 ml   Filed Weights   01/23/20 1906  Weight: 106.6 kg    Examination:  General exam: Appears calm and comfortable  Respiratory system: Clear to auscultation. Respiratory effort normal. Cardiovascular system: S1 & S2 heard, RRR. No JVD, murmurs, rubs, gallops or clicks. No pedal edema. Gastrointestinal system: Abdomen is soft distended, bowel sounds wnl.  Central nervous system: Alert  and oriented. No focal neurological deficits. Extremities: Symmetric 5 x 5 power. Skin: No rashes, lesions or ulcers Psychiatry: Mood & affect appropriate.     Data Reviewed: I have personally reviewed following labs and imaging studies  CBC: Recent Labs  Lab 01/23/20 2008 01/24/20 1034  WBC 6.5  --   NEUTROABS 5.1  --   HGB 6.1* 6.1*  HCT 19.5* 19.5*  MCV 101.0*  --   PLT 93*  --    Basic Metabolic Panel: Recent Labs  Lab 01/23/20 2008 01/24/20 1054  NA 137 137  K 5.4* 4.3  CL 109 111  CO2 21* 19*  GLUCOSE 86 142*  BUN 47* 36*  CREATININE 1.95* 1.51*  CALCIUM 8.6* 7.8*   GFR: Estimated Creatinine Clearance: 73.3 mL/min (A) (by C-G formula based on SCr of 1.51 mg/dL (H)). Liver Function Tests: Recent Labs  Lab 01/23/20 2008  AST 25  ALT 23  ALKPHOS 55  BILITOT 0.6  PROT 7.0  ALBUMIN 3.7   No results for input(s): LIPASE, AMYLASE in the last 168 hours. No results for input(s): AMMONIA in the last 168 hours. Coagulation Profile: Recent Labs  Lab 01/23/20 1938  INR 1.5*   Cardiac Enzymes: No results for input(s): CKTOTAL, CKMB, CKMBINDEX, TROPONINI in the last 168 hours. BNP (last 3 results) No results for input(s): PROBNP in the last 8760 hours. HbA1C: No results for input(s): HGBA1C in the last 72 hours. CBG: Recent Labs  Lab 01/24/20 0732 01/24/20 0837 01/24/20 1022 01/24/20 1149 01/24/20 1424  GLUCAP 53* 86 51* 72 41*   Lipid Profile: No results for input(s): CHOL, HDL, LDLCALC, TRIG, CHOLHDL, LDLDIRECT in the last 72 hours. Thyroid Function Tests: No results for input(s): TSH, T4TOTAL, FREET4, T3FREE, THYROIDAB in the last 72 hours. Anemia Panel: Recent Labs    01/24/20 0311 01/24/20 0346  VITAMINB12  --  711  FOLATE 17.5  --   FERRITIN  --  30  TIBC  --  218*  IRON  --  10*  RETICCTPCT 2.1  --    Sepsis Labs: Recent Labs  Lab 01/23/20 2100  LATICACIDVEN 1.0    Recent Results (from the past 240 hour(s))  Gram stain      Status: None   Collection Time: 01/23/20  9:33 AM   Specimen: Abdomen; Peritoneal Fluid  Result Value Ref Range Status   Specimen Description PERITONEAL  Final   Special Requests NONE  Final   Gram Stain   Final    FEW WBC PRESENT, PREDOMINANTLY PMN NO ORGANISMS SEEN Performed at [facility], [place] [address], [place], [place] [address]    Report Status 01/23/2020 FINAL  Final  Culture, body fluid-bottle     Status: None (Preliminary result)   Collection Time: 01/23/20  9:33 AM   Specimen: Peritoneal Washings  Result Value Ref Range Status   Specimen Description PERITONEAL  Final   Special Requests NONE  Final   Gram Stain   Final    GRAM POSITIVE COCCI IN CHAINS IN BOTH AEROBIC AND ANAEROBIC BOTTLES CRITICAL RESULT CALLED TO, READ BACK BY AND VERIFIED WITH: RN Jenita Seashore @ [phone] 01/24/20 BY Tobey Bride  Performed at [facility], [place] [address] [place], [place] [address]  Culture GRAM POSITIVE COCCI  Final   Report Status PENDING  Incomplete  Blood Culture (routine x 2)     Status: None (Preliminary result)   Collection Time: 01/23/20  9:50 PM   Specimen: BLOOD RIGHT FOREARM  Result Value Ref Range Status   Specimen Description   Final    BLOOD RIGHT FOREARM Performed at [facility], [place] [address] [place], [place] [address]    Special Requests   Final    BOTTLES DRAWN AEROBIC AND ANAEROBIC Blood Culture adequate volume Performed at [place] [address] [place], [place] [address]    Culture PENDING  Incomplete   Report Status PENDING  Incomplete  Respiratory Panel by RT PCR (Flu A&B, Covid) - Nasopharyngeal Swab     Status: None   Collection Time: 01/24/20  5:22 AM   Specimen: Nasopharyngeal Swab  Result Value Ref Range Status   SARS Coronavirus 2 by RT PCR NEGATIVE NEGATIVE Final    Comment: (NOTE) SARS-CoV-2 target nucleic acids are NOT DETECTED. The SARS-CoV-2 RNA is generally detectable in upper respiratoy specimens  during the acute phase of infection. The lowest concentration of SARS-CoV-2 viral copies this assay can detect is 131 copies/mL. A negative result does not preclude SARS-Cov-2 infection and should not be used as the sole basis for treatment or other patient management decisions. A negative result may occur with  improper specimen collection/handling, submission of specimen other than nasopharyngeal swab, presence of viral mutation(s) within the areas targeted by this assay, and inadequate number of viral copies (<131 copies/mL). A negative result must be combined with clinical observations, patient history, and epidemiological information. The expected result is Negative. Fact Sheet for Patients:  PinkCheek.be Fact Sheet for Healthcare Providers:  GravelBags.it This test is not yet ap proved or cleared by the [place] FDA and  has been authorized for detection and/or diagnosis of SARS-CoV-2 by FDA under an Emergency Use Authorization (EUA). This EUA will remain  in effect (meaning this test can be used) for the duration of the COVID-19 declaration under Section 564(b)(1) of the Act, 21 U.S.C. section 360bbb-3(b)(1), unless the authorization is terminated or revoked sooner.    Influenza A by PCR NEGATIVE NEGATIVE Final   Influenza B by PCR NEGATIVE NEGATIVE Final    Comment: (NOTE) The Xpert Xpress SARS-CoV-2/FLU/RSV assay is intended as an aid in  the diagnosis of influenza from Nasopharyngeal swab specimens and  should not be used as a sole basis for treatment. Nasal washings and  aspirates are unacceptable for Xpert Xpress SARS-CoV-2/FLU/RSV  testing. Fact Sheet for Patients: PinkCheek.be Fact Sheet for Healthcare Providers: GravelBags.it This test is not yet approved or cleared by the [place] FDA and  has been authorized for detection and/or diagnosis of  SARS-CoV-2 by  FDA under an Emergency Use Authorization (EUA). This EUA will remain  in effect (meaning this test can be used) for the duration of the  Covid-19 declaration under Section 564(b)(1) of the Act, 21  U.S.C. section 360bbb-3(b)(1), unless the authorization is  terminated or revoked. Performed at [facility], [place] [address], [place], [place] [address]          Radiology Studies: DG Chest 2 View  Result Date: 01/23/2020 CLINICAL DATA:  Abdominal pain. EXAM: CHEST - 2 VIEW COMPARISON:  07/21/2019 FINDINGS: The cardiomediastinal silhouette is unchanged with normal heart size. Mild scarring is again noted in the left lung base. No acute airspace consolidation, edema, pleural effusion,  pneumothorax is identified. No acute osseous abnormality is seen. IMPRESSION: No active cardiopulmonary disease. Electronically Signed   By: Logan Bores M.D.   On: 01/23/2020 19:37   IR Paracentesis  Result Date: 01/23/2020 INDICATION: Abdominal distention. Recurrent ascites. Request for therapeutic and diagnostic paracentesis. EXAM: ULTRASOUND GUIDED RIGHT LOWER QUADRANT PARACENTESIS MEDICATIONS: None. COMPLICATIONS: None immediate. PROCEDURE: Informed written consent was obtained from the patient after a discussion of the risks, benefits and alternatives to treatment. A timeout was performed prior to the initiation of the procedure. Initial ultrasound scanning demonstrates a large amount of ascites within the right lower abdominal quadrant. The right lower abdomen was prepped and draped in the usual sterile fashion. 1% lidocaine was used for local anesthesia. Following this, a 19 gauge, 7-cm, Yueh catheter was introduced. An ultrasound image was saved for documentation purposes. The paracentesis was performed. The catheter was removed and a dressing was applied. The patient tolerated the procedure well without immediate post procedural complication. FINDINGS: A total of approximately  11.1 L of hazy yellow fluid was removed. Samples were sent to the laboratory as requested by the clinical team. IMPRESSION: Successful ultrasound-guided paracentesis yielding 11.1 liters of peritoneal fluid. Read by: Ascencion Dike PA-C Electronically Signed   By: Corrie Mckusick D.O.   On: 01/23/2020 10:11        Scheduled Meds: . sodium chloride   Intravenous Once  . sodium chloride   Intravenous Once  . sodium chloride   Intravenous Once  . insulin aspart  0-9 Units Subcutaneous Q4H  . pantoprazole (PROTONIX) IV  40 mg Intravenous Q24H   Continuous Infusions: . albumin human    . ceFEPime (MAXIPIME) IV Stopped (01/24/20 1113)  . dextrose       LOS: 0 days        Hosie Poisson, MD [facility]   To contact the attending provider between 7A-7P or the covering provider during after hours 7P-7A, please log into the web site [email or web address] and access using universal [place] password for that web site. If you do not have the password, please call the hospital operator.  01/24/2020, 2:44 PM

## 2020-01-24 NOTE — ED Notes (Signed)
Informed M. Sharlet Salina, MD of CBG of 58

## 2020-01-24 NOTE — ED Notes (Signed)
CRITICAL VALUE STICKER  CRITICAL VALUE:Hgb 6.1 RECEIVER (on-site recipient of call):Shawnie Pons, RN  DATE & TIME NOTIFIED: 1:33 PM   MESSENGER (representative from lab):  MD NOTIFIED: Nurse Fara Chute  TIME OF NOTIFICATION:1:33 PM   RESPONSE:

## 2020-01-24 NOTE — ED Notes (Signed)
Provided pt with juice.

## 2020-01-25 ENCOUNTER — Encounter (HOSPITAL_COMMUNITY): Payer: Self-pay | Admitting: Internal Medicine

## 2020-01-25 ENCOUNTER — Other Ambulatory Visit: Payer: Self-pay

## 2020-01-25 DIAGNOSIS — D638 Anemia in other chronic diseases classified elsewhere: Secondary | ICD-10-CM

## 2020-01-25 LAB — GLUCOSE, CAPILLARY
Glucose-Capillary: 122 mg/dL — ABNORMAL HIGH (ref 70–99)
Glucose-Capillary: 122 mg/dL — ABNORMAL HIGH (ref 70–99)
Glucose-Capillary: 189 mg/dL — ABNORMAL HIGH (ref 70–99)

## 2020-01-25 LAB — BASIC METABOLIC PANEL
Anion gap: 5 (ref 5–15)
BUN: 25 mg/dL — ABNORMAL HIGH (ref 6–20)
CO2: 20 mmol/L — ABNORMAL LOW (ref 22–32)
Calcium: 8.1 mg/dL — ABNORMAL LOW (ref 8.9–10.3)
Chloride: 108 mmol/L (ref 98–111)
Creatinine, Ser: 1.03 mg/dL (ref 0.61–1.24)
GFR calc Af Amer: >60 mL/min (ref >60)
GFR calc non Af Amer: >60 mL/min (ref >60)
Glucose, Bld: 83 mg/dL (ref 70–99)
Potassium: 4.2 mmol/L (ref 3.5–5.1)
Sodium: 133 mmol/L — ABNORMAL LOW (ref 135–145)

## 2020-01-25 LAB — CBC
HCT: 22.2 % — ABNORMAL LOW (ref 39.0–52.0)
Hemoglobin: 7.4 g/dL — ABNORMAL LOW (ref 13.0–17.0)
MCH: 32.6 pg (ref 26.0–34.0)
MCHC: 33.3 g/dL (ref 30.0–36.0)
MCV: 97.8 fL (ref 80.0–100.0)
Platelets: 77 10*3/uL — ABNORMAL LOW (ref 150–400)
RBC: 2.27 MIL/uL — ABNORMAL LOW (ref 4.22–5.81)
RDW: 14.2 % (ref 11.5–15.5)
WBC: 4.1 10*3/uL (ref 4.0–10.5)
nRBC: 0 % (ref 0.0–0.2)

## 2020-01-25 LAB — HEMOGLOBIN AND HEMATOCRIT, BLOOD
HCT: 24.9 % — ABNORMAL LOW (ref 39.0–52.0)
Hemoglobin: 7.9 g/dL — ABNORMAL LOW (ref 13.0–17.0)

## 2020-01-25 LAB — CULTURE, BODY FLUID W GRAM STAIN -BOTTLE

## 2020-01-25 LAB — CBG MONITORING, ED
Glucose-Capillary: 71 mg/dL (ref 70–99)
Glucose-Capillary: 89 mg/dL (ref 70–99)
Glucose-Capillary: 92 mg/dL (ref 70–99)

## 2020-01-25 LAB — URINE CULTURE: Culture: NO GROWTH

## 2020-01-25 LAB — HEMOGLOBIN A1C
Hgb A1c MFr Bld: <4.2 % — ABNORMAL LOW (ref 4.8–5.6)
Hgb A1c MFr Bld: 5.3 % (ref 4.8–5.6)
Mean Plasma Glucose: <74 mg/dL
Mean Plasma Glucose: 105.41 mg/dL

## 2020-01-25 LAB — PATHOLOGIST SMEAR REVIEW

## 2020-01-25 MED ORDER — SODIUM CHLORIDE 0.9 % IV SOLN
2.0000 g | INTRAVENOUS | Status: DC
  Administered 2020-01-25 – 2020-01-26 (×2): 2 g via INTRAVENOUS
  Filled 2020-01-25 (×2): qty 2
  Filled 2020-01-25: qty 20

## 2020-01-25 MED ORDER — SODIUM CHLORIDE 0.9 % IV SOLN
125.0000 mg | Freq: Once | INTRAVENOUS | Status: AC
  Administered 2020-01-25: 125 mg via INTRAVENOUS
  Filled 2020-01-25 (×2): qty 10

## 2020-01-25 MED ORDER — PANTOPRAZOLE SODIUM 40 MG PO TBEC
40.0000 mg | DELAYED_RELEASE_TABLET | Freq: Every day | ORAL | Status: DC
  Administered 2020-01-25 – 2020-01-27 (×3): 40 mg via ORAL
  Filled 2020-01-25 (×3): qty 1

## 2020-01-25 MED ORDER — ALBUMIN HUMAN 25 % IV SOLN
50.0000 g | Freq: Once | INTRAVENOUS | Status: DC | PRN
  Filled 2020-01-25: qty 200

## 2020-01-25 MED ORDER — ADULT MULTIVITAMIN W/MINERALS CH
1.0000 | ORAL_TABLET | Freq: Every day | ORAL | Status: DC
  Administered 2020-01-25 – 2020-01-27 (×3): 1 via ORAL
  Filled 2020-01-25 (×3): qty 1

## 2020-01-25 MED ORDER — ENSURE ENLIVE PO LIQD
237.0000 mL | Freq: Two times a day (BID) | ORAL | Status: DC
  Administered 2020-01-25 – 2020-01-27 (×5): 237 mL via ORAL

## 2020-01-25 NOTE — Progress Notes (Addendum)
Progress Note    ASSESSMENT AND PLAN:   14. 54 yo male with decompensated NASH cirrhosis requiring serial paracentesis. Diuresis has been complicated by repeated kidney injury. Now with SBP and AKI.  --Ascitic fluid cutlure + for Viridan strep. Sensitive to Ceftriaxone so will change from Maxipime to Rocephin 2 grams Q 24 hours.   --Beta blocker on hold.  --Renal function has recovered. Cr normal at 1.03 today. Will discontinue IV albumin.  --Can resume diuretics tomorrow. --continue 2 gram sodium diet.  --continue daily PPI, will change to PO --He will need to reschedule missed outpatient Nephrology consult (for diuretic intolerance). Might midodrine be an option?   --Currently undergoing evaluation for TIPS. Grade I DD on echo   2. Anemia. Drop in hgb from 11.8 in October to 6.1. FOBT+ ( not surprising). Got a unit of blood yesterday with appropriate rise in hgb to 7.4.   -No overt GI bleeding.  -No endoscopic procedure planned. Hx of portal hypertensive gastropathy / duodenitis on EGD in Aug 2020.  -Continue home iron and BID protonix        SUBJECTIVE   No complaints. Belly a little sore but no significant pain. Concerned he may be reaccumulating ascites.    OBJECTIVE:     Vital signs in last 24 hours: Temp:  [97.9 F (36.6 C)-99.2 F (37.3 C)] 99.2 F (37.3 C) (02/11 0914) Pulse Rate:  [74-101] 101 (02/11 0914) Resp:  [11-21] 19 (02/11 0914) BP: (101-143)/(51-75) 111/59 (02/11 0914) SpO2:  [92 %-100 %] 100 % (02/11 0914)   General:   Alert male in NAD Heart:  Regular rate and rhythm;  No lower extremity edema   Pulm: Normal respiratory effort   Abdomen:  Soft but distended, nontender.  Normal bowel sounds.          Neurologic:  Alert and  oriented x4;  grossly normal neurologically. Psych:  Pleasant, cooperative.  Normal mood and affect.   Intake/Output from previous day: 02/10 0701 - 02/11 0700 In: 3566.6 [I.V.:1804.6; Blood:1262; IV  Piggyback:500] Out: -  Intake/Output this shift: Total I/O In: 1177.7 [I.V.:977.7; IV Piggyback:200] Out: 700 [Urine:700]  Lab Results: Recent Labs    01/23/20 2008 01/24/20 1034 01/25/20 0522  WBC 6.5  --  4.1  HGB 6.1* 6.1* 7.4*  HCT 19.5* 19.5* 22.2*  PLT 93*  --  77*   BMET Recent Labs    01/23/20 2008 01/24/20 1054 01/25/20 0522  NA 137 137 133*  K 5.4* 4.3 4.2  CL 109 111 108  CO2 21* 19* 20*  GLUCOSE 86 142* 83  BUN 47* 36* 25*  CREATININE 1.95* 1.51* 1.03  CALCIUM 8.6* 7.8* 8.1*   LFT Recent Labs    01/23/20 2008  PROT 7.0  ALBUMIN 3.7  AST 25  ALT 23  ALKPHOS 55  BILITOT 0.6   PT/INR Recent Labs    01/23/20 1938  LABPROT 18.1*  INR 1.5*   Hepatitis Panel No results for input(s): HEPBSAG, HCVAB, HEPAIGM, HEPBIGM in the last 72 hours.  DG Chest 2 View  Result Date: 01/23/2020 CLINICAL DATA:  Abdominal pain. EXAM: CHEST - 2 VIEW COMPARISON:  07/21/2019 FINDINGS: The cardiomediastinal silhouette is unchanged with normal heart size. Mild scarring is again noted in the left lung base. No acute airspace consolidation, edema, pleural effusion, pneumothorax is identified. No acute osseous abnormality is seen. IMPRESSION: No active cardiopulmonary disease. Electronically Signed   By: Logan Bores M.D.   On: 01/23/2020 19:37  IR Paracentesis  Result Date: 01/23/2020 INDICATION: Abdominal distention. Recurrent ascites. Request for therapeutic and diagnostic paracentesis. EXAM: ULTRASOUND GUIDED RIGHT LOWER QUADRANT PARACENTESIS MEDICATIONS: None. COMPLICATIONS: None immediate. PROCEDURE: Informed written consent was obtained from the patient after a discussion of the risks, benefits and alternatives to treatment. A timeout was performed prior to the initiation of the procedure. Initial ultrasound scanning demonstrates a large amount of ascites within the right lower abdominal quadrant. The right lower abdomen was prepped and draped in the usual sterile fashion.  1% lidocaine was used for local anesthesia. Following this, a 19 gauge, 7-cm, Yueh catheter was introduced. An ultrasound image was saved for documentation purposes. The paracentesis was performed. The catheter was removed and a dressing was applied. The patient tolerated the procedure well without immediate post procedural complication. FINDINGS: A total of approximately 11.1 L of hazy yellow fluid was removed. Samples were sent to the laboratory as requested by the clinical team. IMPRESSION: Successful ultrasound-guided paracentesis yielding 11.1 liters of peritoneal fluid. Read by: Ascencion Dike PA-C Electronically Signed   By: Corrie Mckusick D.O.   On: 01/23/2020 10:11      Principal Problem:   SBP (spontaneous bacterial peritonitis) ([place]) Active Problems:   Cirrhosis ([place])   Diabetes mellitus without complication ([place])   Portal hypertensive gastropathy ([place])   Esophageal varices ([place])     LOS: 1 day   Tye Savoy ,NP 01/25/2020, 9:41 AM   I have discussed the case with the PA, and that is the plan I formulated. I personally interviewed and examined the patient.  CC: SBP  Feeling fairly well - bloated as before from ascites. No reported fever. Denies chest pain, dyspnea or dysuria.   Ambulating without assistance.  Renal function has normalized.  -   Ascites Cx with viridans strep - now on CTX.  Multiple sensitivities - will change to oral Abx upon discharge. Diuretics have been on hold.  Will discuss with Dr. Bryan Lemma about possible repeat paracentesis to check cell count and possibly remove a modest amount of fluid for symptom relief.  Would have to be very cautious re: renal function.  Acute on chronic anemia related to cirrhosis.  No overt GI bleeding - no endoscopic procedures planned at present.  IV iron will be given. (low iron /sat/ferritin)  Total time 25 minutes  Nelida Meuse III Office: [phone]

## 2020-01-25 NOTE — Progress Notes (Signed)
Report received from ED 

## 2020-01-25 NOTE — ED Notes (Signed)
Pt provided breakfast tray.

## 2020-01-25 NOTE — Progress Notes (Addendum)
PROGRESS NOTE    Martin Strong  MLY:[id number] DOB: [birth year] DOA: 01/23/2020 PCP: [name], NP    Brief Narrative:  54 year old gentleman prior history of decompensated liver cirrhosis from [place] with large volume ascites s/p recent paracentesis where 11 L were removed presents to ED with AKI and SBP. Patient has history of recurrent variceal bleeding requiring multiple EGDs with banding, hypertensive gastropathy, diuretic intolerant ascites, anemia, hyperlipidemia, hypertension, non-insulin-dependent diabetes mellitus, mild thrombocytopenia in addition to the above. Ascites fluid analysis shows elevated neutrophil count suspicious for SBP.  He was started on Maxipime, transitioned to rocephin after final culture report. Patient's hemoglobin also dropped from 11-6.1 without any obvious signs of bleeding.  S/p 3 units of prbc transfusion and repeat hemoglobin around 7.4.GI consulted and appreciate recommendations.   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 Patient underwent paracentesis where 11 L were removed on 01/23/2020. Acetic fluid analysis shows 2480 WBCs and negative gram stain. Patient was started on cefepime for spontaneous bacterial peritonitis.  Fluid cultures show streptococcus viridans . Maxipime transitioned to ceftriaxone.  GI on board and recommended IV albumin.  Possible repeat paracentesis in the next 24 hours piror to discharge.     Acute anemia of blood loss versus symptomatic anemia in the setting of liver cirrhosis secondary to [facility] Patient's hemoglobin dropped from a baseline around 11-6.1 on admission. FOBT positive  but no frank bleeding noticed on rectal exam in ED. S/p 3 units PRBC given. Repeat H&H shows hemoglobin of 7.4 GI suggested no endoscopic procedures this admission.  IV iron ordered by GI.  IV PPI on board.  Portal hypertensive gastropathy Beta-blockers on hold due to hypotension   AKI Probably secondary to hypotension from a aggressive paracentesis. Repeat renal parameters wnl this am.  Continue to hold diuretics at this time.    Non-insulin-dependent diabetes A1c is 5.3 Continue with sliding scale insulin. CBG (last 3)  Recent Labs    01/25/20 0750 01/25/20 1301 01/25/20 1651  GLUCAP 71 122* 122*   Hold diabetic meds on discharge.   Chronic thrombocytopenia Continue to monitor.   Hypoglycemia Patient asymptomatic Resolved.   DVT prophylaxis: SCDs Code Status: Full code Family Communication: (None at bedside   Disposition Plan:  . Patient came from: Home           . Anticipated d/c place: Possibly home . Barriers to d/c OR conditions which need to be met to effect a safe d/c: Pending further evaluation of SBP, not ready for discharge   Consultants:   Gastroenterology  Procedures: None  Antimicrobials: Maxipime since admission.   Subjective: Pt reports abd discomfort,. No chest pain or sob. No nausea, vomiting or abd pain.  No fevers or chills.   Objective: Vitals:   01/25/20 0715 01/25/20 0800 01/25/20 0914 01/25/20 1303  BP: 116/62 (!) 143/72 (!) 111/59 122/60  Pulse: 83 99 (!) 101 93  Resp: 14 13 19 20   Temp:   99.2 F (37.3 C) 98.5 F (36.9 C)  TempSrc:   Oral Oral  SpO2: 97% 99% 100% 99%  Weight:      Height:        Intake/Output Summary (Last 24 hours) at 01/25/2020 1655  Last data filed at 01/25/2020 1400 Gross per 24 hour  Intake 2804.68 ml  Output 1300 ml  Net 1504.68 ml   Filed Weights   01/23/20 1906  Weight: 106.6 kg    Examination:  General exam: alert and comfortable.  Respiratory system: clear to auscultation, no  wheezing or rhonchi.  Cardiovascular system: S1 & S2 heard, RRR.  No pedal edema. Gastrointestinal system: Abdomen is soft, mildly tender, distended, bowel sounds good.  Central nervous system: alert and oriented, grossly non focal.  Extremities: no pedal edema.  Skin: no rashes.  Psychiatry:  Mood is appropriate.    Data Reviewed: I have personally reviewed following labs and imaging studies  CBC: Recent Labs  Lab 01/23/20 2008 01/24/20 1034 01/25/20 0522  WBC 6.5  --  4.1  NEUTROABS 5.1  --   --   HGB 6.1* 6.1* 7.4*  HCT 19.5* 19.5* 22.2*  MCV 101.0*  --  97.8  PLT 93*  --  77*   Basic Metabolic Panel: Recent Labs  Lab 01/23/20 2008 01/24/20 1054 01/25/20 0522  NA 137 137 133*  K 5.4* 4.3 4.2  CL 109 111 108  CO2 21* 19* 20*  GLUCOSE 86 142* 83  BUN 47* 36* 25*  CREATININE 1.95* 1.51* 1.03  CALCIUM 8.6* 7.8* 8.1*   GFR: Estimated Creatinine Clearance: 107.5 mL/min (by C-G formula based on SCr of 1.03 mg/dL). Liver Function Tests: Recent Labs  Lab 01/23/20 2008  AST 25  ALT 23  ALKPHOS 55  BILITOT 0.6  PROT 7.0  ALBUMIN 3.7   No results for input(s): LIPASE, AMYLASE in the last 168 hours. No results for input(s): AMMONIA in the last 168 hours. Coagulation Profile: Recent Labs  Lab 01/23/20 1938  INR 1.5*   Cardiac Enzymes: No results for input(s): CKTOTAL, CKMB, CKMBINDEX, TROPONINI in the last 168 hours. BNP (last 3 results) No results for input(s): PROBNP in the last 8760 hours. HbA1C: Recent Labs    01/24/20 0311 01/25/20 0522  HGBA1C <4.2* 5.3   CBG: Recent Labs  Lab 01/24/20 2322 01/25/20 0033 01/25/20 0333 01/25/20 0750 01/25/20 1301  GLUCAP 141* 89 92 71 122*   Lipid Profile: No results for input(s): CHOL, HDL, LDLCALC, TRIG, CHOLHDL, LDLDIRECT in the last 72 hours. Thyroid Function Tests: No results for input(s): TSH, T4TOTAL, FREET4, T3FREE, THYROIDAB in the last 72 hours. Anemia Panel: Recent Labs    01/24/20 0311  01/24/20 0346  VITAMINB12  --  711  FOLATE 17.5  --   FERRITIN  --  30  TIBC  --  218*  IRON  --  10*  RETICCTPCT 2.1  --    Sepsis Labs: Recent Labs  Lab 01/23/20 2100  LATICACIDVEN 1.0    Recent Results (from the past 240 hour(s))  Gram stain     Status: None   Collection Time: 01/23/20  9:33 AM   Specimen: Abdomen; Peritoneal Fluid  Result Value Ref Range Status   Specimen Description PERITONEAL  Final   Special Requests NONE  Final   Gram Stain   Final    FEW WBC PRESENT, PREDOMINANTLY PMN NO ORGANISMS SEEN Performed at [facility], [place] [address], [place], [place] [address]    Report Status 01/23/2020 FINAL  Final  Culture, body fluid-bottle     Status: Abnormal   Collection Time: 01/23/20  9:33 AM   Specimen: Peritoneal Washings  Result Value Ref Range Status   Specimen Description PERITONEAL  Final   Special  Requests NONE  Final   Gram Stain   Final    GRAM POSITIVE COCCI IN CHAINS IN BOTH AEROBIC AND ANAEROBIC BOTTLES CRITICAL RESULT CALLED TO, READ BACK BY AND VERIFIED WITH: RN Jenita Seashore @ [phone] 01/24/20 BY Tobey Bride  Performed at [facility], [place] [address] [place], [place] [address]    Culture VIRIDANS STREPTOCOCCUS (A)  Final   Report Status 01/25/2020 FINAL  Final   Organism ID, Bacteria VIRIDANS STREPTOCOCCUS  Final      Susceptibility   Viridans streptococcus - MIC*    PENICILLIN 0.12 SENSITIVE Sensitive     CEFTRIAXONE <=0.12 SENSITIVE Sensitive     ERYTHROMYCIN 2 RESISTANT Resistant     LEVOFLOXACIN 2 SENSITIVE Sensitive     VANCOMYCIN 1 SENSITIVE Sensitive     * VIRIDANS STREPTOCOCCUS  Blood Culture (routine x 2)     Status: None (Preliminary result)   Collection Time: 01/23/20  9:04 PM   Specimen: BLOOD  Result Value Ref Range Status   Specimen Description   Final    BLOOD LEFT ANTECUBITAL Performed at [place] [address] [place], [place] [address]    Special Requests   Final    BOTTLES DRAWN  AEROBIC AND ANAEROBIC Blood Culture adequate volume Performed at [place] [address] [place], [place] [address]    Culture   Final    NO GROWTH 1 DAY Performed at [facility], [place] [address] [place], [place] [address]    Report Status PENDING  Incomplete  Blood Culture (routine x 2)     Status: None (Preliminary result)   Collection Time: 01/23/20  9:50 PM   Specimen: BLOOD RIGHT FOREARM  Result Value Ref Range Status   Specimen Description   Final    BLOOD RIGHT FOREARM Performed at [facility], [place] [address], [place], [place] [address]    Special Requests   Final    BOTTLES DRAWN AEROBIC AND ANAEROBIC Blood Culture adequate volume Performed at [place] [address], [place], [place] [address]    Culture   Final    NO GROWTH 1 DAY Performed at [facility], [place] [address], [place], [place] [address]    Report Status PENDING  Incomplete  Urine culture     Status: None   Collection Time: 01/24/20  1:54 AM   Specimen: In/Out Cath Urine  Result Value Ref Range Status   Specimen Description   Final    IN/OUT CATH URINE Performed at [place] [address] [place], [place] [address]    Special Requests   Final    NONE Performed at [facility], [place] [address], [place], [place] [address]    Culture   Final    NO GROWTH Performed at [facility], [place] [address] [place],  [address]    Report Status 01/25/2020 FINAL  Final  Respiratory Panel by RT PCR (Flu A&B, Covid) - Nasopharyngeal Swab     Status: None   Collection Time: 01/24/20  5:22 AM   Specimen: Nasopharyngeal Swab  Result Value Ref Range Status   SARS Coronavirus 2 by RT PCR NEGATIVE NEGATIVE Final    Comment: (NOTE) SARS-CoV-2 target nucleic acids are NOT DETECTED. The SARS-CoV-2 RNA is generally detectable in upper respiratoy specimens during the acute phase of infection. The  lowest concentration of SARS-CoV-2 viral copies this assay can detect is  131 copies/mL. A negative result does not preclude SARS-Cov-2 infection and should not be used as the sole basis for treatment or other patient management decisions. A negative result may occur with  improper specimen collection/handling, submission of specimen other than nasopharyngeal swab, presence of viral mutation(s) within the areas targeted by this assay, and inadequate number of viral copies (<131 copies/mL). A negative result must be combined with clinical observations, patient history, and epidemiological information. The expected result is Negative. Fact Sheet for Patients:  PinkCheek.be Fact Sheet for Healthcare Providers:  GravelBags.it This test is not yet ap proved or cleared by the [place] FDA and  has been authorized for detection and/or diagnosis of SARS-CoV-2 by FDA under an Emergency Use Authorization (EUA). This EUA will remain  in effect (meaning this test can be used) for the duration of the COVID-19 declaration under Section 564(b)(1) of the Act, 21 U.S.C. section 360bbb-3(b)(1), unless the authorization is terminated or revoked sooner.    Influenza A by PCR NEGATIVE NEGATIVE Final   Influenza B by PCR NEGATIVE NEGATIVE Final    Comment: (NOTE) The Xpert Xpress SARS-CoV-2/FLU/RSV assay is intended as an aid in  the diagnosis of influenza from Nasopharyngeal swab specimens and  should not be used as a sole basis for treatment. Nasal washings and  aspirates are unacceptable for Xpert Xpress SARS-CoV-2/FLU/RSV  testing. Fact Sheet for Patients: PinkCheek.be Fact Sheet for Healthcare Providers: GravelBags.it This test is not yet approved or cleared by the [place] FDA and  has been authorized for detection and/or diagnosis of SARS-CoV-2 by  FDA under an Emergency  Use Authorization (EUA). This EUA will remain  in effect (meaning this test can be used) for the duration of the  Covid-19 declaration under Section 564(b)(1) of the Act, 21  U.S.C. section 360bbb-3(b)(1), unless the authorization is  terminated or revoked. Performed at [facility], [place] [address] [place], [place] [address]          Radiology Studies: DG Chest 2 View  Result Date: 01/23/2020 CLINICAL DATA:  Abdominal pain. EXAM: CHEST - 2 VIEW COMPARISON:  07/21/2019 FINDINGS: The cardiomediastinal silhouette is unchanged with normal heart size. Mild scarring is again noted in the left lung base. No acute airspace consolidation, edema, pleural effusion, pneumothorax is identified. No acute osseous abnormality is seen. IMPRESSION: No active cardiopulmonary disease. Electronically Signed   By: Logan Bores M.D.   On: 01/23/2020 19:37        Scheduled Meds: . sodium chloride   Intravenous Once  . sodium chloride   Intravenous Once  . sodium chloride   Intravenous Once  . insulin aspart  0-9 Units Subcutaneous Q4H  . pantoprazole  40 mg Oral Daily   Continuous Infusions: . albumin human    . cefTRIAXone (ROCEPHIN)  IV       LOS: 1 day        Hosie Poisson, MD [facility]   To contact the attending provider between 7A-7P or the covering provider during after hours 7P-7A, please log into the web site [email or web address] and access using universal [place] password for that web site. If you do not have the password, please call the hospital operator.  01/25/2020, 4:55 PM

## 2020-01-25 NOTE — ED Notes (Signed)
Pt with standby assistance to [facility].

## 2020-01-25 NOTE — Progress Notes (Signed)
Initial Nutrition Assessment  DOCUMENTATION CODES:   Obesity unspecified  INTERVENTION:  Ensure Enlive po BID, each supplement provides 350 kcal and 20 grams of protein MVI with minerals daily   NUTRITION DIAGNOSIS:   Increased nutrient needs related to chronic illness, acute illness(NASH cirrhosis;  spontaneous bacterial peritonitis) as evidenced by estimated needs.    GOAL:   Patient will meet greater than or equal to 90% of their needs    MONITOR:   Labs, Vent status, I & O's, Supplement acceptance, PO intake, Weight trends  REASON FOR ASSESSMENT:   Malnutrition Screening Tool    ASSESSMENT:  RD working remotely.  54 year old male with past medical history of NASH cirrhosis with associated esophageal varices and recurring variceal bleeding requiring multiple EGDs with banding, portal hypertensive gastropathy, h/o diuretic intolerant ascites s/p large volume paracentesis, thrombocytopenia, anemia, HTN, HLD, DM2, presented to ED with abdominal soreness and fatigue s/p undergoing paracentesis (11 L removed) earlier in the day with cell count concerning for SBP.  Patient admitted or SBP and AKI  Per notes: - ascitic fluid culture + for Viridan strep - drop in Hgb from 11.8 in October to 6.1 FOBT+ and is s/p 1 unit PRBC. No overt GI bleeding noted.  - undergoing evaluation for TIPS. Grade I DD on echo.  Patient NPO at midnight for 2/12 paracentesis, currently on 2 gram Na diet. No meals at this time for review. Will continue to monitor for po intake and provide supplements to aid with estimated needs.   Current wt 234.52 lbs Weight history reviewed, patient has lost 5.5 lbs over the past 3 months which is insignificant for time frame.  Medications reviewed and include: SS novolog, Protonix, Rocephin, Ferric gluconate  Labs: CBGs 122, 71, 92, 89 x 24 hrs, Na 133 (L), BUN 25 (H), Hgb 7.4 (L) Lab Results  Component Value Date   HGBA1C 5.3 01/25/2020    NUTRITION -  FOCUSED PHYSICAL EXAM: Unable to complete at this time, RD working remotely.  Diet Order:   Diet Order            Diet NPO time specified Except for: Sips with Meds  Diet effective midnight        Diet 2 gram sodium Room service appropriate? Yes; Fluid consistency: Thin  Diet effective now              EDUCATION NEEDS:   No education needs have been identified at this time  Skin:  Skin Assessment: Reviewed RN Assessment  Last BM:  2/11  Height:   Ht Readings from Last 1 Encounters:  01/23/20 6' 1"  (1.854 m)    Weight:   Wt Readings from Last 1 Encounters:  01/23/20 106.6 kg    Ideal Body Weight:  83.6 kg  BMI:  Body mass index is 31 kg/m.  Estimated Nutritional Needs:   Kcal:  2100-2300  Protein:  117-128 (1.1-1.2 g/kg)  Fluid:  >/= 2.1 L/day   Lajuan Lines, RD, LDN Clinical Nutrition [facility] Telephone [phone] After Hours/Weekend Pager: [phone]

## 2020-01-26 ENCOUNTER — Inpatient Hospital Stay (HOSPITAL_COMMUNITY): Payer: BC Managed Care – PPO

## 2020-01-26 LAB — BODY FLUID CELL COUNT WITH DIFFERENTIAL
Lymphs, Fluid: 25 %
Monocyte-Macrophage-Serous Fluid: 62 % (ref 50–90)
Neutrophil Count, Fluid: 13 % (ref 0–25)
Total Nucleated Cell Count, Fluid: 398 cu mm (ref 0–1000)

## 2020-01-26 LAB — TYPE AND SCREEN
ABO/RH(D): A POS
Antibody Screen: NEGATIVE
Unit division: 0
Unit division: 0
Unit division: 0

## 2020-01-26 LAB — CBC
HCT: 24.2 % — ABNORMAL LOW (ref 39.0–52.0)
Hemoglobin: 7.7 g/dL — ABNORMAL LOW (ref 13.0–17.0)
MCH: 31 pg (ref 26.0–34.0)
MCHC: 31.8 g/dL (ref 30.0–36.0)
MCV: 97.6 fL (ref 80.0–100.0)
Platelets: 83 10*3/uL — ABNORMAL LOW (ref 150–400)
RBC: 2.48 MIL/uL — ABNORMAL LOW (ref 4.22–5.81)
RDW: 13.7 % (ref 11.5–15.5)
WBC: 3.6 10*3/uL — ABNORMAL LOW (ref 4.0–10.5)
nRBC: 0 % (ref 0.0–0.2)

## 2020-01-26 LAB — BPAM RBC
Blood Product Expiration Date: 202103052359
Blood Product Expiration Date: 202103092359
Blood Product Expiration Date: 202103092359
ISSUE DATE / TIME: 202102100609
ISSUE DATE / TIME: 202102101537
ISSUE DATE / TIME: 202102110051
Unit Type and Rh: 6200
Unit Type and Rh: 6200
Unit Type and Rh: 6200

## 2020-01-26 LAB — GLUCOSE, CAPILLARY
Glucose-Capillary: 176 mg/dL — ABNORMAL HIGH (ref 70–99)
Glucose-Capillary: 160 mg/dL — ABNORMAL HIGH (ref 70–99)
Glucose-Capillary: 147 mg/dL — ABNORMAL HIGH (ref 70–99)
Glucose-Capillary: 209 mg/dL — ABNORMAL HIGH (ref 70–99)
Glucose-Capillary: 242 mg/dL — ABNORMAL HIGH (ref 70–99)
Glucose-Capillary: 239 mg/dL — ABNORMAL HIGH (ref 70–99)

## 2020-01-26 LAB — PATHOLOGIST SMEAR REVIEW

## 2020-01-26 LAB — BASIC METABOLIC PANEL
Anion gap: 7 (ref 5–15)
BUN: 20 mg/dL (ref 6–20)
CO2: 20 mmol/L — ABNORMAL LOW (ref 22–32)
Calcium: 8.4 mg/dL — ABNORMAL LOW (ref 8.9–10.3)
Chloride: 107 mmol/L (ref 98–111)
Creatinine, Ser: 0.98 mg/dL (ref 0.61–1.24)
GFR calc Af Amer: >60 mL/min (ref >60)
GFR calc non Af Amer: >60 mL/min (ref >60)
Glucose, Bld: 165 mg/dL — ABNORMAL HIGH (ref 70–99)
Potassium: 4.6 mmol/L (ref 3.5–5.1)
Sodium: 134 mmol/L — ABNORMAL LOW (ref 135–145)

## 2020-01-26 IMAGING — US US PARACENTESIS
1 series · 5 of 5 positions shown · non-contrast
Comparison: none

INDICATION: History of cirrhosis. Ascites. Positive fluid culture after recent
paracentesis. Request for repeat diagnostic and therapeutic
paracentesis up to 2 L max.

[Series 1: us paracentesis · 5 of 5 slices shown]
[im 1/5]
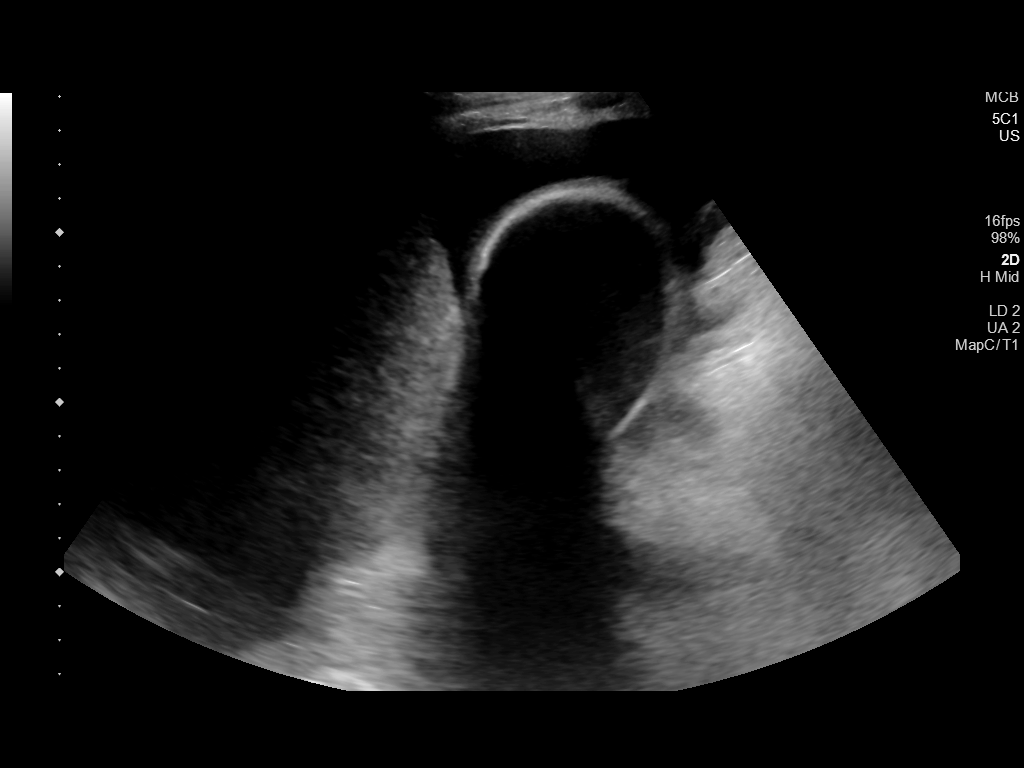
[im 2/5]
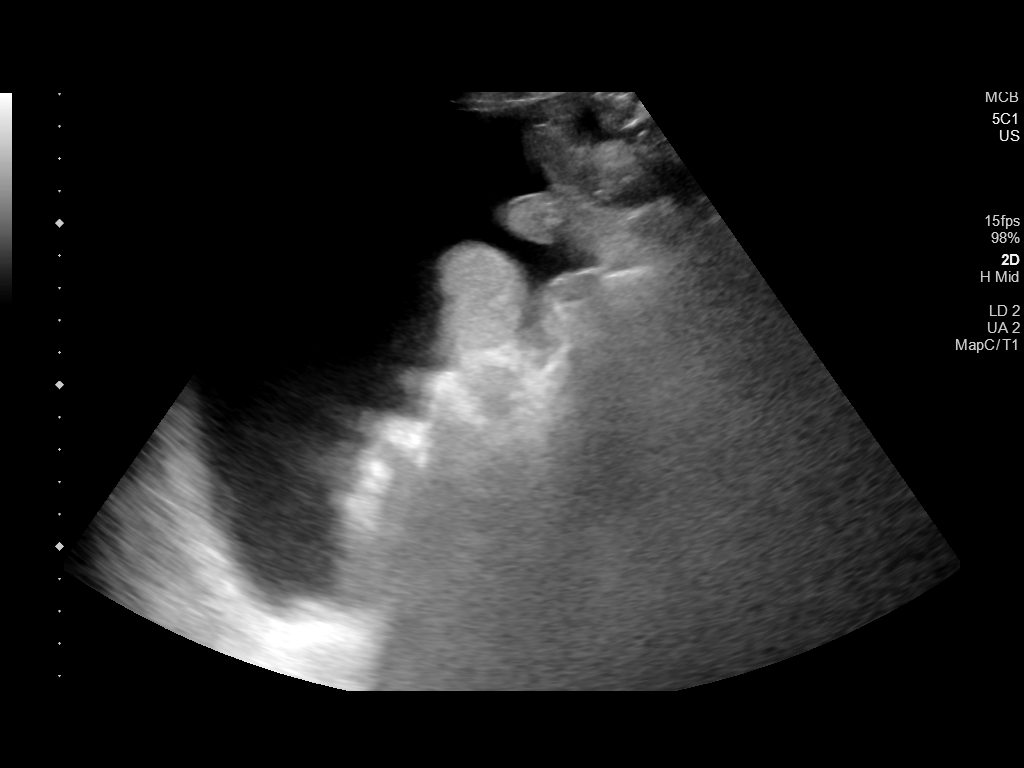
[im 3/5]
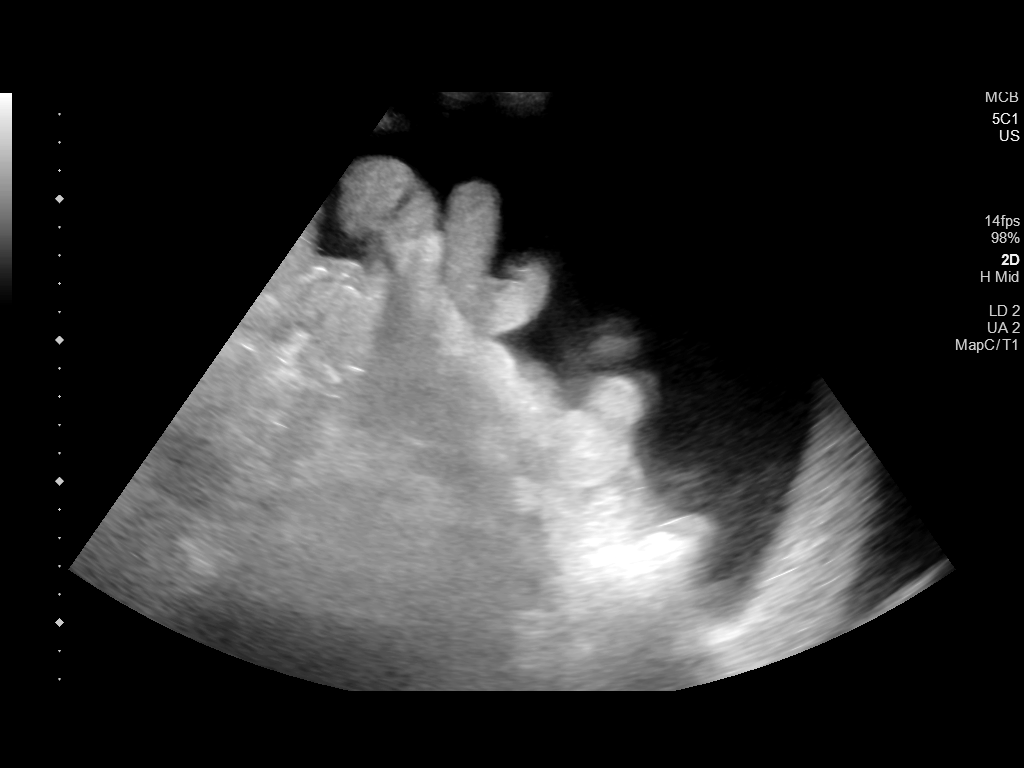
[im 4/5]
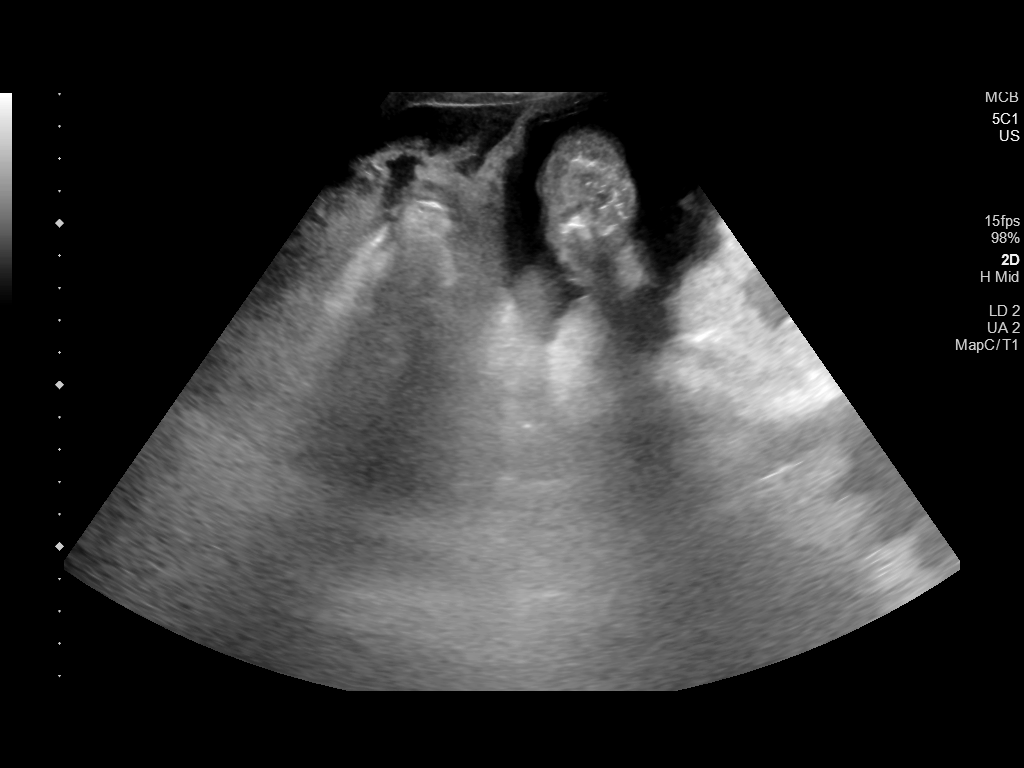
[im 5/5]
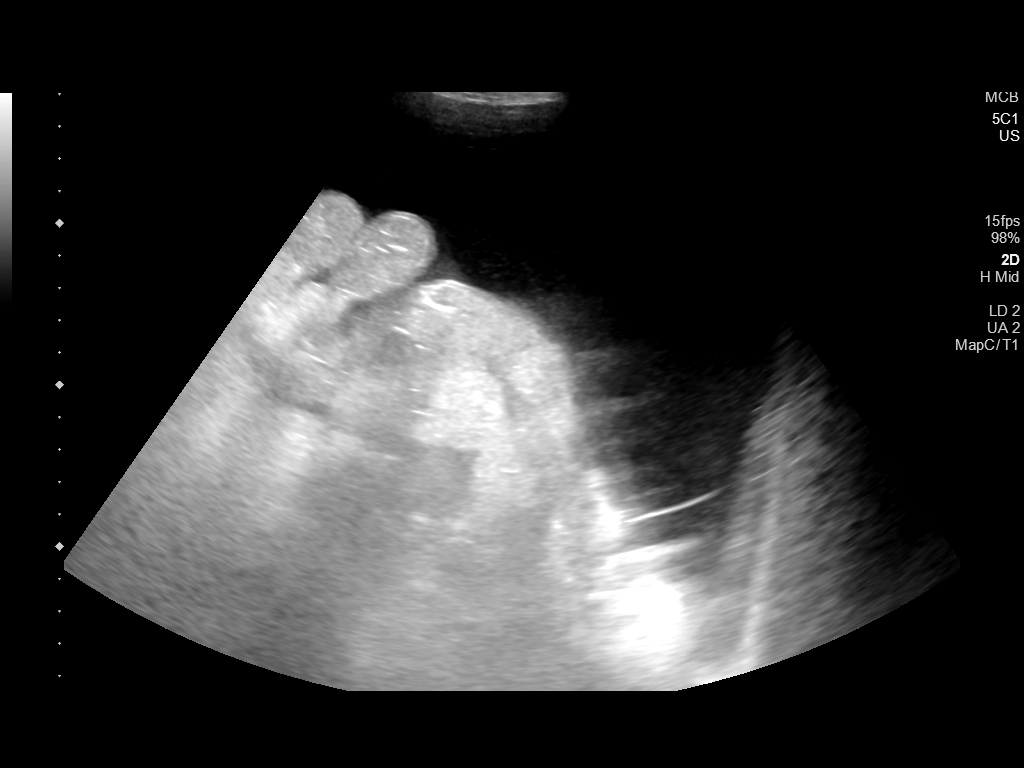

[5 of 5 positions shown; findings below may reference images not displayed]

EXAM:
ULTRASOUND GUIDED LEFT LOWER QUADRANT PARACENTESIS

MEDICATIONS:
None.

COMPLICATIONS:
None immediate.

PROCEDURE:
Informed written consent was obtained from the patient after a
discussion of the risks, benefits and alternatives to treatment. A
timeout was performed prior to the initiation of the procedure.

Initial ultrasound scanning demonstrates a large amount of ascites
within the left lower abdominal quadrant. The left lower abdomen was
prepped and draped in the usual sterile fashion. 1% lidocaine was
used for local anesthesia.

Following this, a 19 gauge, 7-cm, Yueh catheter was introduced. An
ultrasound image was saved for documentation purposes. The
paracentesis was performed. The catheter was removed and a dressing
was applied. The patient tolerated the procedure well without
immediate post procedural complication.
FINDINGS: A total of approximately 2 L of hazy yellow fluid was removed.
Samples were sent to the laboratory as requested by the clinical
team.
IMPRESSION: Successful ultrasound-guided paracentesis yielding 2 liters of
peritoneal fluid.

## 2020-01-26 MED ORDER — CHLORHEXIDINE GLUCONATE CLOTH 2 % EX PADS
6.0000 | MEDICATED_PAD | Freq: Every day | CUTANEOUS | Status: DC
  Administered 2020-01-26: 6 via TOPICAL

## 2020-01-26 MED ORDER — LIDOCAINE HCL 1 % IJ SOLN
INTRAMUSCULAR | Status: AC
  Filled 2020-01-26: qty 10

## 2020-01-26 NOTE — Procedures (Signed)
PROCEDURE SUMMARY:  Successful US guided paracentesis from [facility].  Yielded 2L of hazy yellow fluid.  No immediate complications.  Pt tolerated well.   Specimen was sent for labs.  EBL < 27m  KAscencion DikePA-C 01/26/2020 10:22 AM

## 2020-01-26 NOTE — Progress Notes (Signed)
PROGRESS NOTE    Martin Strong  BMW:[id number] DOB: [birth year] DOA: 01/23/2020 PCP: [name], NP    Brief Narrative:  54 year old gentleman prior history of decompensated liver cirrhosis from [place] with large volume ascites s/p recent paracentesis where 11 L were removed presents to ED with AKI and SBP. Patient has history of recurrent variceal bleeding requiring multiple EGDs with banding, hypertensive gastropathy, diuretic intolerant ascites, anemia, hyperlipidemia, hypertension, non-insulin-dependent diabetes mellitus, mild thrombocytopenia in addition to the above. Ascites fluid analysis shows elevated neutrophil count suspicious for SBP.  He was started on Maxipime, transitioned to rocephin after final culture report. Patient's hemoglobin also dropped from 11-6.1 without any obvious signs of bleeding.  S/p 3 units of prbc transfusion and repeat hemoglobin around 7.4.GI consulted and appreciate recommendations.   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 Patient underwent paracentesis where 11 L were removed on 01/23/2020.  Repeat paracentesis today and 2 L were removed.  Plan to watch his renal parameters post paracentesis and if stable plan to discharge the patient in am.  Patient was started on cefepime transitioned to rocephin  for spontaneous bacterial peritonitis as per the  Ascitic Fluid cultures identification and sensitivities.  Ascitic fluid cultures grew streptococcus viridans.   Acute anemia of blood loss versus symptomatic anemia in the setting of liver cirrhosis secondary to NASH Patient's hemoglobin dropped from a baseline at 11  to 6.1 on admission. FOBT positive but no frank bleeding noticed on rectal exam in ED. S/p 3 units PRBC given. Repeat H&H shows hemoglobin of 7.4 GI suggested no endoscopic procedures this admission.  IV iron infusion given. Continue with PPI.    Portal hypertensive gastropathy Beta-blockers on hold due to hypotension. Plan to restart them on discharge.    AKI Probably secondary to hypotension from  aggressive paracentesis. Repeat renal parameters show  Improvement to baseline creatinine.    Non-insulin-dependent diabetes A1c is 5.3 Continue with sliding scale insulin. CBG (last 3)  Recent Labs    01/26/20 0405 01/26/20 0801 01/26/20 1203  GLUCAP 160* 147* 209*   Hold diabetic meds on discharge. Discussed with the patient.    Chronic thrombocytopenia Continue to monitor. No bleeding episodes.   Hypoglycemia Patient asymptomatic Resolved.   DVT prophylaxis: SCDs Code Status: Full code Family Communication: (None at bedside   Disposition Plan:  . Patient came from: Home           . Anticipated d/c place: Possibly home tomorrow.  Barriers to d/c OR conditions which need to be met to effect a safe d/c: pending renal function, if renal function stays stable by tomorrow. Plan to discharge him home in am.   Consultants:   Gastroenterology  Procedures: None  Antimicrobials:  Anti-infectives (From admission, onward)   Start     Dose/Rate Route Frequency Ordered Stop   01/25/20 1700  cefTRIAXone (ROCEPHIN) 2 g in sodium chloride 0.9 % 100 mL IVPB     2 g 200 mL/hr over 30 Minutes Intravenous Every 24 hours 01/25/20 0945     01/24/20 0600  ceFEPIme (MAXIPIME) 2 g in sodium chloride 0.9 % 100 mL IVPB  Status:  Discontinued     2 g 200  mL/hr over 30 Minutes Intravenous Every 8 hours 01/24/20 0057 01/25/20 0945   01/23/20 1945  ceFEPIme (MAXIPIME) 2 g in sodium chloride 0.9 % 100 mL IVPB     2 g 200 mL/hr over 30 Minutes Intravenous  Once 01/23/20 1938 01/23/20 2210       Subjective: Pt denies any chest pain or sob, nausea, vomiting or abd pain.   Objective: Vitals:   01/26/20 0954 01/26/20 0957 01/26/20 1027 01/26/20 1215  BP: 128/76 122/69 126/72 127/68  Pulse:   97 (!) 106  Resp:    20  Temp:   98.9 F (37.2 C) 98.4 F (36.9 C)  TempSrc:   Oral Oral  SpO2:   98% 99%  Weight:      Height:       No intake or output data in the 24 hours ending 01/26/20 1548 Filed Weights   01/23/20 1906  Weight: 106.6 kg    Examination:  General exam: Patient is alert afebrile and comfortable, not in any kind of distress Respiratory system: clear  to auscultation bilaterally, no wheezing or rhonchi Cardiovascular system: S1S2 heard, RRR, no JVD, no pedal edema.  Gastrointestinal system: abdomen is soft, non tender, distended, bowel sounds heard.  Central nervous system: alert and oriented. Grossly non focal.  Extremities: No pedal edema.  Skin: No rashes. Psychiatry:  Mood is appropriate.    Data Reviewed: I have personally reviewed following labs and imaging studies  CBC: Recent Labs  Lab 01/23/20 2008 01/24/20 1034 01/25/20 0522 01/25/20 2034 01/26/20 0437  WBC 6.5  --  4.1  --  3.6*  NEUTROABS 5.1  --   --   --   --   HGB 6.1* 6.1* 7.4* 7.9* 7.7*  HCT 19.5* 19.5* 22.2* 24.9* 24.2*  MCV 101.0*  --  97.8  --  97.6  PLT 93*  --  77*  --  83*   Basic Metabolic Panel: Recent Labs  Lab 01/23/20 2008 01/24/20 1054 01/25/20 0522 01/26/20 0437  NA 137 137 133* 134*  K 5.4* 4.3 4.2 4.6  CL 109 111 108 107  CO2 21* 19* 20* 20*  GLUCOSE 86 142* 83 165*  BUN 47* 36* 25* 20  CREATININE 1.95* 1.51* 1.03 0.98  CALCIUM 8.6* 7.8* 8.1* 8.4*   GFR: Estimated Creatinine Clearance: 113 mL/min (by C-G formula based on SCr of 0.98 mg/dL). Liver Function Tests: Recent Labs  Lab 01/23/20 2008  AST 25  ALT 23  ALKPHOS 55  BILITOT 0.6  PROT 7.0  ALBUMIN 3.7   No results for input(s): LIPASE, AMYLASE in the last 168 hours. No results for  input(s): AMMONIA in the last 168 hours. Coagulation Profile: Recent Labs  Lab 01/23/20 1938  INR 1.5*   Cardiac Enzymes: No results for input(s): CKTOTAL, CKMB, CKMBINDEX, TROPONINI in the last 168 hours. BNP (last 3 results) No results for input(s): PROBNP in the last 8760 hours. HbA1C: Recent Labs    01/24/20 0311 01/25/20 0522  HGBA1C <4.2* 5.3   CBG: Recent Labs  Lab 01/25/20 2003 01/26/20 0138 01/26/20 0405 01/26/20 0801 01/26/20 1203  GLUCAP 189* 176* 160* 147* 209*   Lipid Profile: No results for input(s): CHOL, HDL, LDLCALC, TRIG, CHOLHDL, LDLDIRECT in the last 72 hours. Thyroid Function Tests: No results for input(s): TSH, T4TOTAL, FREET4, T3FREE, THYROIDAB in the last 72 hours. Anemia Panel: Recent Labs    01/24/20 0311 01/24/20 0346  VITAMINB12  --  711  FOLATE 17.5  --  FERRITIN  --  30  TIBC  --  218*  IRON  --  10*  RETICCTPCT 2.1  --    Sepsis Labs: Recent Labs  Lab 01/23/20 2100  LATICACIDVEN 1.0    Recent Results (from the past 240 hour(s))  Gram stain     Status: None   Collection Time: 01/23/20  9:33 AM   Specimen: Abdomen; Peritoneal Fluid  Result Value Ref Range Status   Specimen Description PERITONEAL  Final   Special Requests NONE  Final   Gram Stain   Final    FEW WBC PRESENT, PREDOMINANTLY PMN NO ORGANISMS SEEN Performed at [facility], [address] [place], [place] [address]    Report Status 01/23/2020 FINAL  Final  Culture, body fluid-bottle     Status: Abnormal   Collection Time: 01/23/20  9:33 AM   Specimen: Peritoneal Washings  Result Value Ref Range Status   Specimen Description PERITONEAL  Final   Special Requests NONE  Final   Gram Stain   Final    GRAM POSITIVE COCCI IN CHAINS IN BOTH AEROBIC AND ANAEROBIC BOTTLES CRITICAL RESULT CALLED TO, READ BACK BY AND VERIFIED WITH: RN Jenita Seashore @ [phone] 01/24/20 BY Tobey Bride  Performed at [facility], [place] [address], [place], [place] [address]     Culture VIRIDANS STREPTOCOCCUS (A)  Final   Report Status 01/25/2020 FINAL  Final   Organism ID, Bacteria VIRIDANS STREPTOCOCCUS  Final      Susceptibility   Viridans streptococcus - MIC*    PENICILLIN 0.12 SENSITIVE Sensitive     CEFTRIAXONE <=0.12 SENSITIVE Sensitive     ERYTHROMYCIN 2 RESISTANT Resistant     LEVOFLOXACIN 2 SENSITIVE Sensitive     VANCOMYCIN 1 SENSITIVE Sensitive     * VIRIDANS STREPTOCOCCUS  Blood Culture (routine x 2)     Status: None (Preliminary result)   Collection Time: 01/23/20  9:04 PM   Specimen: BLOOD  Result Value Ref Range Status   Specimen Description   Final    BLOOD LEFT ANTECUBITAL Performed at [facility] [address] [place], [place] [address]    Special Requests   Final    BOTTLES DRAWN AEROBIC AND ANAEROBIC Blood Culture adequate volume Performed at [facility] [address], [place], [place] [address]    Culture   Final    NO GROWTH 2 DAYS Performed at [facility] [address], [place], [place] [address]    Report Status PENDING  Incomplete  Blood Culture (routine x 2)     Status: None (Preliminary result)   Collection Time: 01/23/20  9:50 PM   Specimen: BLOOD RIGHT FOREARM  Result Value Ref Range Status   Specimen Description   Final    BLOOD RIGHT FOREARM Performed at [facility], [place] [address], [place], [place] [address]    Special Requests   Final    BOTTLES DRAWN AEROBIC AND ANAEROBIC Blood Culture adequate volume Performed at [facility] [address] [place], [place] [address]    Culture   Final    NO GROWTH 2 DAYS Performed at [facility] [address] [place],  [address]    Report Status PENDING  Incomplete  Urine culture     Status: None   Collection Time: 01/24/20  1:54 AM   Specimen: In/Out Cath Urine  Result Value Ref Range Status   Specimen Description  Final    IN/OUT CATH URINE Performed at [facility], [place] [address], [place], [place] [address]    Special Requests   Final    NONE Performed at [facility], [place] [address], [place], [place] [address]    Culture   Final    NO GROWTH Performed at [facility], [place] [address] [place], [place] [address]    Report Status 01/25/2020 FINAL  Final  Respiratory Panel by RT PCR (Flu A&B, Covid) - Nasopharyngeal Swab     Status: None   Collection Time: 01/24/20  5:22 AM   Specimen: Nasopharyngeal Swab  Result Value Ref Range Status   SARS Coronavirus 2 by RT PCR NEGATIVE NEGATIVE Final    Comment: (NOTE) SARS-CoV-2 target nucleic acids are NOT DETECTED. The SARS-CoV-2 RNA is generally detectable in upper respiratoy specimens during the acute phase of infection. The lowest concentration of SARS-CoV-2 viral copies this assay can detect is 131 copies/mL. A negative result does not preclude SARS-Cov-2 infection and should not be used as the sole basis for treatment or other patient management decisions. A negative result may occur with  improper specimen collection/handling, submission of specimen other than nasopharyngeal swab, presence of viral mutation(s) within the areas targeted by this assay, and inadequate number of viral copies (<131 copies/mL). A negative result must be combined with clinical observations, patient history, and epidemiological information. The expected result is Negative. Fact Sheet for Patients:  PinkCheek.be Fact Sheet for Healthcare Providers:  GravelBags.it This test is not yet ap proved or cleared by the [place] FDA and  has been authorized for detection and/or diagnosis of SARS-CoV-2 by FDA under an Emergency Use Authorization (EUA). This EUA will remain  in effect (meaning this test can be used) for the duration of the COVID-19 declaration under Section 564(b)(1) of the Act, 21 U.S.C. section  360bbb-3(b)(1), unless the authorization is terminated or revoked sooner.    Influenza A by PCR NEGATIVE NEGATIVE Final   Influenza B by PCR NEGATIVE NEGATIVE Final    Comment: (NOTE) The Xpert Xpress SARS-CoV-2/FLU/RSV assay is intended as an aid in  the diagnosis of influenza from Nasopharyngeal swab specimens and  should not be used as a sole basis for treatment. Nasal washings and  aspirates are unacceptable for Xpert Xpress SARS-CoV-2/FLU/RSV  testing. Fact Sheet for Patients: PinkCheek.be Fact Sheet for Healthcare Providers: GravelBags.it This test is not yet approved or cleared by the [place] FDA and  has been authorized for detection and/or diagnosis of SARS-CoV-2 by  FDA under an Emergency Use Authorization (EUA). This EUA will remain  in effect (meaning this test can be used) for the duration of the  Covid-19 declaration under Section 564(b)(1) of the Act, 21  U.S.C. section 360bbb-3(b)(1), unless the authorization is  terminated or revoked. Performed at [facility], [place] [address], [place], [place] [address]   Culture, body fluid-bottle     Status: None (Preliminary result)   Collection Time: 01/26/20  9:51 AM   Specimen: Peritoneal Washings  Result Value Ref Range Status   Specimen Description PERITONEAL  Final   Special Requests BOTTLES DRAWN AEROBIC AND ANAEROBIC  Final   Culture PENDING  Incomplete   Report Status PENDING  Incomplete         Radiology Studies: US Paracentesis  Result Date: 01/26/2020 INDICATION: History of cirrhosis. Ascites. Positive fluid culture after recent paracentesis. Request for repeat diagnostic and therapeutic paracentesis up to 2  L max. EXAM: ULTRASOUND GUIDED LEFT LOWER QUADRANT PARACENTESIS MEDICATIONS: None. COMPLICATIONS: None immediate. PROCEDURE: Informed written consent was obtained from the patient after a discussion of the risks, benefits and  alternatives to treatment. A timeout was performed prior to the initiation of the procedure. Initial ultrasound scanning demonstrates a large amount of ascites within the left lower abdominal quadrant. The left lower abdomen was prepped and draped in the usual sterile fashion. 1% lidocaine was used for local anesthesia. Following this, a 19 gauge, 7-cm, Yueh catheter was introduced. An ultrasound image was saved for documentation purposes. The paracentesis was performed. The catheter was removed and a dressing was applied. The patient tolerated the procedure well without immediate post procedural complication. FINDINGS: A total of approximately 2 L of hazy yellow fluid was removed. Samples were sent to the laboratory as requested by the clinical team. IMPRESSION: Successful ultrasound-guided paracentesis yielding 2 liters of peritoneal fluid. Read by: Ascencion Dike PA-C Electronically Signed   By: Sandi Mariscal M.D.   On: 01/26/2020 10:24        Scheduled Meds: . sodium chloride   Intravenous Once  . sodium chloride   Intravenous Once  . sodium chloride   Intravenous Once  . Chlorhexidine Gluconate Cloth  6 each Topical Q0600  . feeding supplement (ENSURE ENLIVE)  237 mL Oral BID BM  . insulin aspart  0-9 Units Subcutaneous Q4H  . lidocaine      . multivitamin with minerals  1 tablet Oral Daily  . pantoprazole  40 mg Oral Daily   Continuous Infusions: . albumin human    . cefTRIAXone (ROCEPHIN)  IV 2 g (01/25/20 1710)     LOS: 2 days        Hosie Poisson, MD [facility]   To contact the attending provider between 7A-7P or the covering provider during after hours 7P-7A, please log into the web site [email or web address] and access using universal [place] password for that web site. If you do not have the password, please call the hospital operator.  01/26/2020, 3:48 PM

## 2020-01-26 NOTE — Progress Notes (Signed)
GI Progress Note  Chief Complaint: Spontaneous bacterial peritonitis  History:  Martin Strong underwent 2 L paracentesis today, and cell count shows considerable decrease from the initial values.  He denies abdominal pain, has no leakage from the tap site.  Appetite has been good, tolerating regular diet.  Having what he feels to be good urine output.  Abdomen distended and bloated as always from ascites.  ROS: Cardiovascular: No chest pain Respiratory: No dyspnea Urinary: No dysuria  Objective:   Current Facility-Administered Medications:  .  0.9 %  sodium chloride infusion (Manually program via Guardrails IV Fluids), , Intravenous, Once, Shela Leff, MD, Stopped at 01/24/20 1049 .  0.9 %  sodium chloride infusion (Manually program via Guardrails IV Fluids), , Intravenous, Once, Hosie Poisson, MD .  0.9 %  sodium chloride infusion (Manually program via Guardrails IV Fluids), , Intravenous, Once, Hosie Poisson, MD .  acetaminophen (TYLENOL) tablet 650 mg, 650 mg, Oral, Q6H PRN **OR** acetaminophen (TYLENOL) suppository 650 mg, 650 mg, Rectal, Q6H PRN, Shela Leff, MD .  albumin human 25 % solution 50 g, 50 g, Intravenous, Once PRN, Cirigliano, Vito V, DO .  cefTRIAXone (ROCEPHIN) 2 g in sodium chloride 0.9 % 100 mL IVPB, 2 g, Intravenous, Q24H, Willia Craze, NP, Last Rate: 200 mL/hr at 01/25/20 1710, 2 g at 01/25/20 1710 .  Chlorhexidine Gluconate Cloth 2 % PADS 6 each, 6 each, Topical, Q0600, Hosie Poisson, MD, 6 each at 01/26/20 0517 .  feeding supplement (ENSURE ENLIVE) (ENSURE ENLIVE) liquid 237 mL, 237 mL, Oral, BID BM, Hosie Poisson, MD, 237 mL at 01/26/20 1023 .  insulin aspart (novoLOG) injection 0-9 Units, 0-9 Units, Subcutaneous, Q4H, Shela Leff, MD, 3 Units at 01/26/20 1219 .  lidocaine (XYLOCAINE) 1 % (with pres) injection, , , ,  .  multivitamin with minerals tablet 1 tablet, 1 tablet, Oral, Daily, Hosie Poisson, MD, 1 tablet at 01/26/20 1023 .   ondansetron (ZOFRAN) injection 4 mg, 4 mg, Intravenous, Q6H PRN, Shela Leff, MD .  pantoprazole (PROTONIX) EC tablet 40 mg, 40 mg, Oral, Daily, Tye Savoy M, NP, 40 mg at 01/26/20 1022  . albumin human    . cefTRIAXone (ROCEPHIN)  IV 2 g (01/25/20 1710)     Vital signs in last 24 hrs: Vitals:   01/26/20 1027 01/26/20 1215  BP: 126/72 127/68  Pulse: 97 (!) 106  Resp:  20  Temp: 98.9 F (37.2 C) 98.4 F (36.9 C)  SpO2: 98% 99%    Intake/Output Summary (Last 24 hours) at 01/26/2020 1301 Last data filed at 01/25/2020 1400 Gross per 24 hour  Intake --  Output 300 ml  Net -300 ml     Physical Exam   HEENT: sclera anicteric, oral mucosa without lesions  Neck: supple, no thyromegaly, JVD or lymphadenopathy  Cardiac: RRR without murmurs, S1S2 heard, no peripheral edema  Pulm: clear to auscultation bilaterally, normal RR and effort noted  Abdomen: soft, no tenderness, with active bowel sounds.  Bulging flanks  Skin; warm and dry, no jaundice, + pale  Recent Labs:  CBC Latest Ref Rng & Units 01/26/2020 01/25/2020 01/25/2020  WBC 4.0 - 10.5 K/uL 3.6(L) - 4.1  Hemoglobin 13.0 - 17.0 g/dL 7.7(L) 7.9(L) 7.4(L)  Hematocrit 39.0 - 52.0 % 24.2(L) 24.9(L) 22.2(L)  Platelets 150 - 400 K/uL 83(L) - 77(L)    Recent Labs  Lab 01/23/20 1938  INR 1.5*   CMP Latest Ref Rng & Units 01/26/2020 01/25/2020 01/24/2020  Glucose 70 - 99  mg/dL 165(H) 83 142(H)  BUN 6 - 20 mg/dL 20 25(H) 36(H)  Creatinine 0.61 - 1.24 mg/dL 0.98 1.03 1.51(H)  Sodium 135 - 145 mmol/L 134(L) 133(L) 137  Potassium 3.5 - 5.1 mmol/L 4.6 4.2 4.3  Chloride 98 - 111 mmol/L 107 108 111  CO2 22 - 32 mmol/L 20(L) 20(L) 19(L)  Calcium 8.9 - 10.3 mg/dL 8.4(L) 8.1(L) 7.8(L)  Total Protein 6.5 - 8.1 g/dL - - -  Total Bilirubin 0.3 - 1.2 mg/dL - - -  Alkaline Phos 38 - 126 U/L - - -  AST 15 - 41 U/L - - -  ALT 0 - 44 U/L - - -   Cell count 398 on today's ascitic fluid, compared to 2483 days  prior  Radiologic studies:  Report from today's paracentesis reviewed @ASSESSMENTPLANBEGIN @ Assessment: Spontaneous bacterial peritonitis with viridans strep.  Currently on appropriate IV antibiotics. End-stage liver disease/cirrhosis Refractory large volume ascites, treatment limited by relative hypotension and renal sensitivity Generalized abdominal pain and bloating from chronic large volume ascites Pancytopenia with more significant anemia having developed in the last few months.  No overt GI bleeding.  No endoscopic procedures this admission.  Plan: He is clinically improving.  Fortunately, his renal function has normalized.  However, he has demonstrated renal function sensitivity due to his compromised volume status, so I feel we should monitor him in the hospital until tomorrow since he had another paracentesis today (albeit relatively low volume). If renal function stable tomorrow, he can be discharged home on oral antibiotics for close outpatient follow-up with Dr. Bryan Lemma.  I will message him so he can review notes and become up-to-date on the patient's clinical progress, and then make his plans for timing of repeat paracentesis and coordination of care with interventional radiology for TIPS reconsideration after recovery from this infection.  Total time 25 minutes. Nelida Meuse III Office: [phone]

## 2020-01-27 DIAGNOSIS — E119 Type 2 diabetes mellitus without complications: Secondary | ICD-10-CM

## 2020-01-27 LAB — COMPREHENSIVE METABOLIC PANEL
ALT: 27 U/L (ref 0–44)
AST: 28 U/L (ref 15–41)
Albumin: 3.7 g/dL (ref 3.5–5.0)
Alkaline Phosphatase: 108 U/L (ref 38–126)
Anion gap: 8 (ref 5–15)
BUN: 24 mg/dL — ABNORMAL HIGH (ref 6–20)
CO2: 22 mmol/L (ref 22–32)
Calcium: 8.7 mg/dL — ABNORMAL LOW (ref 8.9–10.3)
Chloride: 103 mmol/L (ref 98–111)
Creatinine, Ser: 0.94 mg/dL (ref 0.61–1.24)
GFR calc Af Amer: >60 mL/min (ref >60)
GFR calc non Af Amer: >60 mL/min (ref >60)
Glucose, Bld: 191 mg/dL — ABNORMAL HIGH (ref 70–99)
Potassium: 4.8 mmol/L (ref 3.5–5.1)
Sodium: 133 mmol/L — ABNORMAL LOW (ref 135–145)
Total Bilirubin: 1 mg/dL (ref 0.3–1.2)
Total Protein: 7.2 g/dL (ref 6.5–8.1)

## 2020-01-27 LAB — CBC WITH DIFFERENTIAL/PLATELET
Abs Immature Granulocytes: 0.02 10*3/uL (ref 0.00–0.07)
Basophils Absolute: 0 10*3/uL (ref 0.0–0.1)
Basophils Relative: 1 %
Eosinophils Absolute: 0.1 10*3/uL (ref 0.0–0.5)
Eosinophils Relative: 3 %
HCT: 27.2 % — ABNORMAL LOW (ref 39.0–52.0)
Hemoglobin: 8.6 g/dL — ABNORMAL LOW (ref 13.0–17.0)
Immature Granulocytes: 0 %
Lymphocytes Relative: 12 %
Lymphs Abs: 0.6 10*3/uL — ABNORMAL LOW (ref 0.7–4.0)
MCH: 31 pg (ref 26.0–34.0)
MCHC: 31.6 g/dL (ref 30.0–36.0)
MCV: 98.2 fL (ref 80.0–100.0)
Monocytes Absolute: 0.5 10*3/uL (ref 0.1–1.0)
Monocytes Relative: 11 %
Neutro Abs: 3.6 10*3/uL (ref 1.7–7.7)
Neutrophils Relative %: 73 %
Platelets: 105 10*3/uL — ABNORMAL LOW (ref 150–400)
RBC: 2.77 MIL/uL — ABNORMAL LOW (ref 4.22–5.81)
RDW: 13.3 % (ref 11.5–15.5)
WBC: 4.9 10*3/uL (ref 4.0–10.5)
nRBC: 0 % (ref 0.0–0.2)

## 2020-01-27 LAB — GLUCOSE, CAPILLARY
Glucose-Capillary: 156 mg/dL — ABNORMAL HIGH (ref 70–99)
Glucose-Capillary: 187 mg/dL — ABNORMAL HIGH (ref 70–99)
Glucose-Capillary: 236 mg/dL — ABNORMAL HIGH (ref 70–99)
Glucose-Capillary: 243 mg/dL — ABNORMAL HIGH (ref 70–99)

## 2020-01-27 MED ORDER — ENSURE ENLIVE PO LIQD: 237.0000 mL | Freq: Two times a day (BID) | ORAL | 12 refills | Status: AC

## 2020-01-27 MED ORDER — AMOXICILLIN-POT CLAVULANATE 875-125 MG PO TABS: 1.0000 | ORAL_TABLET | Freq: Two times a day (BID) | ORAL | 0 refills | Status: AC

## 2020-01-27 MED ORDER — PROPRANOLOL HCL 40 MG PO TABS: 20.0000 mg | ORAL_TABLET | Freq: Two times a day (BID) | ORAL | 0 refills | Status: AC

## 2020-01-27 NOTE — Progress Notes (Signed)
I will be at [facility] hospital much of the day, so chart check performed.  If not events overnight, and if morning labs show stable creatinine, this patient can be discharged home today on 10 more days of oral antibiotic that will cover V strep per the C/S.  Dr. Bryan Lemma aware patient likely to go home over weekend, and will make close follow up plans.

## 2020-01-27 NOTE — Progress Notes (Signed)
Patient was given discharge information with no immediate questions or concerns. Patient was discharged via wheelchair.

## 2020-01-28 NOTE — Discharge Summary (Signed)
Physician Discharge Summary  Martin Strong KWI:[id number] DOB: 1966-10-12 DOA: 01/23/2020  PCP: [name], NP  Admit date: 01/23/2020 Discharge date: 01/27/2020  Admitted From: Home.  Disposition:  Home.   Recommendations for Outpatient Follow-up:  1. Follow up with PCP in 1-2 weeks 2. Please obtain BMP/CBC in one week Please follow up with gastroenterology as scheduled and recommended.   Discharge Condition:stable.  CODE STATUS:full code. Diet recommendation: Heart Healthy   Brief/Interim Summary:  54 year old gentleman prior history of decompensated liver cirrhosis from [place] with large volume ascites s/p recent paracentesis where 11 L were removed presents to ED with AKI and SBP. Patient has history of recurrent variceal bleeding requiring multiple EGDs with banding, hypertensive gastropathy, diuretic intolerant ascites, anemia, hyperlipidemia, hypertension, non-insulin-dependent diabetes mellitus, mild thrombocytopenia in addition to the above. Ascites fluid analysis shows elevated neutrophil count suspicious for SBP.  He was started on Maxipime, transitioned to rocephin after final culture report. Patient's hemoglobin also dropped from 11-6.1 without any obvious signs of bleeding.  S/p 3 units of prbc transfusion and repeat hemoglobin is stable between 7 to 8. GI consulted and appreciate recommendations.   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 Patient underwent paracentesis where 11 L were removed on 01/23/2020.  Repeat paracentesis today and 2 L were removed.  Patient's renal parameters were stable post paracentesis and plan to discharge  the patient on 10 more days of Augmentin as per GI recommendations. Patient was started on cefepime transitioned to rocephin and then to Augmentin on discharge  for spontaneous bacterial peritonitis as per the  Ascitic Fluid cultures identification and sensitivities.  Ascitic fluid cultures grew streptococcus viridans.   Acute anemia of blood loss versus symptomatic anemia in the setting of liver cirrhosis secondary to NASH Patient's hemoglobin dropped from a baseline at 11 to 6.1 on admission. FOBT positive but no frank bleeding noticed on rectal exam in ED. S/p 3 units PRBC given. Repeat H&H shows stable hemoglobin between 7-8 GI suggested no endoscopic procedures this admission.  IV iron infusion given. Continue with PPI.    Portal hypertensive gastropathy we started him on a low-dose propranolol    AKI Probably secondary to hypotension from  aggressive paracentesis. Repeat renal parameters show  Improvement to baseline creatinine.  Restarted him on Lasix on discharge   Non-insulin-dependent diabetes A1c is 5.3 Continue with sliding scale insulin. CBG (last 3)  Recent Labs (last 2 labs)        Recent Labs    01/26/20 0405 01/26/20 0801 01/26/20 1203  GLUCAP 160* 147* 209*     Hold diabetic meds on discharge. Discussed with the patient.    Chronic thrombocytopenia Continue to monitor. No bleeding episodes.   Hypoglycemia Patient asymptomatic Resolved.    Discharge Instructions  Discharge Instructions    Diet - low sodium heart healthy   Complete by: As directed    Discharge instructions   Complete by: As directed    Please follow up with gastroenterology in 1 week.     Allergies as of 01/27/2020      Reactions   Nadolol Itching, Nausea Only      Medication List    STOP taking these medications   Wilder Glade  10 MG Tabs tablet Generic drug: dapagliflozin propanediol   glipiZIDE 10 MG tablet Commonly known as: GLUCOTROL   lisinopril 20 MG  tablet Commonly known as: ZESTRIL   metFORMIN 1000 MG tablet Commonly known as: GLUCOPHAGE   spironolactone 25 MG tablet Commonly known as: ALDACTONE     TAKE these medications   amoxicillin-clavulanate 875-125 MG tablet Commonly known as: Augmentin Take 1 tablet by mouth every 12 (twelve) hours for 10 days.   cholecalciferol 1000 units tablet Commonly known as: VITAMIN D Take 1,000 Units by mouth 2 (two) times daily.   feeding supplement (ENSURE ENLIVE) Liqd Take 237 mLs by mouth 2 (two) times daily between meals.   ferrous sulfate 325 (65 FE) MG tablet Commonly known as: KP Ferrous Sulfate Take 1 tablet (325 mg total) by mouth daily with breakfast. What changed: when to take this   furosemide 20 MG tablet Commonly known as: Lasix Take 1 tablet (20 mg total) by mouth daily.   multivitamin tablet Take 1 tablet by mouth daily.   pantoprazole 40 MG tablet Commonly known as: PROTONIX Take 1 tablet (40 mg total) by mouth 2 (two) times daily. Notes to patient: Second dose due today   propranolol 40 MG tablet Commonly known as: INDERAL Take 0.5 tablets (20 mg total) by mouth 2 (two) times daily. What changed: how much to take       Allergies  Allergen Reactions  . Nadolol Itching and Nausea Only    Consultations:  Gastroenterology.    Procedures/Studies: DG Chest 2 View  Result Date: 01/23/2020 CLINICAL DATA:  Abdominal pain. EXAM: CHEST - 2 VIEW COMPARISON:  07/21/2019 FINDINGS: The cardiomediastinal silhouette is unchanged with normal heart size. Mild scarring is again noted in the left lung base. No acute airspace consolidation, edema, pleural effusion, pneumothorax is identified. No acute osseous abnormality is seen. IMPRESSION: No active cardiopulmonary disease. Electronically Signed   By: Logan Bores M.D.   On: 01/23/2020 19:37   US Paracentesis  Result Date: 01/26/2020 INDICATION: History of cirrhosis. Ascites. Positive fluid culture after recent  paracentesis. Request for repeat diagnostic and therapeutic paracentesis up to 2 L max. EXAM: ULTRASOUND GUIDED LEFT LOWER QUADRANT PARACENTESIS MEDICATIONS: None. COMPLICATIONS: None immediate. PROCEDURE: Informed written consent was obtained from the patient after a discussion of the risks, benefits and alternatives to treatment. A timeout was performed prior to the initiation of the procedure. Initial ultrasound scanning demonstrates a large amount of ascites within the left lower abdominal quadrant. The left lower abdomen was prepped and draped in the usual sterile fashion. 1% lidocaine was used for local anesthesia. Following this, a 19 gauge, 7-cm, Yueh catheter was introduced. An ultrasound image was saved for documentation purposes. The paracentesis was performed. The catheter was removed and a dressing was applied. The patient tolerated the procedure well without immediate post procedural complication. FINDINGS: A total of approximately 2 L of hazy yellow fluid was removed. Samples were sent to the laboratory as requested by the clinical team. IMPRESSION: Successful ultrasound-guided paracentesis yielding 2 liters of peritoneal fluid. Read by: Ascencion Dike PA-C Electronically Signed   By: Sandi Mariscal M.D.   On: 01/26/2020 10:24   ECHOCARDIOGRAM COMPLETE  Result Date: 01/19/2020   ECHOCARDIOGRAM REPORT   Patient Name:   SHUBH CHIARA [facility] Date of Exam: 01/19/2020 Medical Rec #:  [id number]          Height:       73.0 in Accession #:    [id number]  Weight:       236.0 lb Date of Birth:  1966/09/06         BSA:          2.31 m Patient Age:    23 years           BP:           105/61 mmHg Patient Gender: M                  HR:           79 bpm. Exam Location:  Outpatient Procedure: 2D Echo, Cardiac Doppler and Color Doppler Indications:    Z01.818 Encounter for other preprocedural examination  History:        Patient has no prior history of Echocardiogram examinations.                 Risk  Factors:Hypertension, Diabetes, Dyslipidemia and GERD.                 Cirrhosis.  Sonographer:    Jonelle Sidle Dance Referring Phys: 61 HEATH [place]  1. Left ventricular ejection fraction, by visual estimation, is 60 to 65%. The left ventricle has normal function. There is no left ventricular hypertrophy.  2. Left ventricular diastolic parameters are consistent with Grade I diastolic dysfunction (impaired relaxation).  3. Global right ventricle has normal systolic function.The right ventricular size is normal.  4. Left atrial size was normal.  5. Right atrial size was normal.  6. The mitral valve is normal in structure. Trivial mitral valve regurgitation. No evidence of mitral stenosis.  7. The tricuspid valve is normal in structure. Tricuspid valve regurgitation is trivial.  8. The aortic valve is tricuspid. Aortic valve regurgitation is not visualized. Mild aortic valve sclerosis without stenosis.  9. The pulmonic valve was normal in structure. Pulmonic valve regurgitation is trivial. 10. The inferior vena cava is normal in size with greater than 50% respiratory variability, suggesting right atrial pressure of 3 mmHg. 11. Normal LV systolic function; grade 1 diastolic dysfunction. 12. The left ventricle has no regional wall motion abnormalities. FINDINGS  Left Ventricle: Left ventricular ejection fraction, by visual estimation, is 60 to 65%. The left ventricle has normal function. The left ventricle has no regional wall motion abnormalities. There is no left ventricular hypertrophy. Left ventricular diastolic parameters are consistent with Grade I diastolic dysfunction (impaired relaxation). Right Ventricle: The right ventricular size is normal.Global RV systolic function is has normal systolic function. Left Atrium: Left atrial size was normal in size. Right Atrium: Right atrial size was normal in size Pericardium: There is no evidence of pericardial effusion. There is pleural effusion in the left  lateral region. Mitral Valve: The mitral valve is normal in structure. Trivial mitral valve regurgitation. No evidence of mitral valve stenosis by observation. Tricuspid Valve: The tricuspid valve is normal in structure. Tricuspid valve regurgitation is trivial. Aortic Valve: The aortic valve is tricuspid. Aortic valve regurgitation is not visualized. Mild aortic valve sclerosis is present, with no evidence of aortic valve stenosis. Pulmonic Valve: The pulmonic valve was normal in structure. Pulmonic valve regurgitation is trivial. Pulmonic regurgitation is trivial. Aorta: The aortic root is normal in size and structure. Venous: The inferior vena cava is normal in size with greater than 50% respiratory variability, suggesting right atrial pressure of 3 mmHg. IAS/Shunts: No atrial level shunt detected by color flow Doppler. Additional Comments: Normal LV systolic function; grade 1 diastolic dysfunction.  LEFT VENTRICLE PLAX 2D  LVIDd:         5.30 cm  Diastology LVIDs:         3.30 cm  LV e' lateral:   9.46 cm/s LV PW:         0.90 cm  LV E/e' lateral: 8.4 LV IVS:        1.00 cm  LV e' medial:    7.07 cm/s LVOT diam:     2.30 cm  LV E/e' medial:  11.2 LV SV:         91 ml LV SV Index:   38.41 LVOT Area:     4.15 cm  RIGHT VENTRICLE             IVC RV Basal diam:  3.30 cm     IVC diam: 1.70 cm RV Mid diam:    2.20 cm RV S prime:     12.20 cm/s TAPSE (M-mode): 2.5 cm LEFT ATRIUM             Index       RIGHT ATRIUM           Index LA diam:        4.90 cm 2.12 cm/m  RA Area:     10.10 cm LA Vol (A2C):   60.2 ml 26.08 ml/m RA Volume:   18.30 ml  7.93 ml/m LA Vol (A4C):   43.6 ml 18.89 ml/m LA Biplane Vol: 54.8 ml 23.74 ml/m  AORTIC VALVE LVOT Vmax:   104.00 cm/s LVOT Vmean:  70.800 cm/s LVOT VTI:    0.228 m  AORTA Ao Root diam: 4.00 cm Ao Asc diam:  3.00 cm MITRAL VALVE MV Area (PHT): 3.37 cm             SHUNTS MV PHT:        65.25 msec           Systemic VTI:  0.23 m MV Decel Time: 225 msec             Systemic  Diam: 2.30 cm MV E velocity: 79.20 cm/s 103 cm/s MV A velocity: 90.00 cm/s 70.3 cm/s MV E/A ratio:  0.88       1.5  Kirk Ruths MD Electronically signed by Kirk Ruths MD Signature Date/Time: 01/19/2020/12:40:01 PM    Final    IR Paracentesis  Result Date: 01/23/2020 INDICATION: Abdominal distention. Recurrent ascites. Request for therapeutic and diagnostic paracentesis. EXAM: ULTRASOUND GUIDED RIGHT LOWER QUADRANT PARACENTESIS MEDICATIONS: None. COMPLICATIONS: None immediate. PROCEDURE: Informed written consent was obtained from the patient after a discussion of the risks, benefits and alternatives to treatment. A timeout was performed prior to the initiation of the procedure. Initial ultrasound scanning demonstrates a large amount of ascites within the right lower abdominal quadrant. The right lower abdomen was prepped and draped in the usual sterile fashion. 1% lidocaine was used for local anesthesia. Following this, a 19 gauge, 7-cm, Yueh catheter was introduced. An ultrasound image was saved for documentation purposes. The paracentesis was performed. The catheter was removed and a dressing was applied. The patient tolerated the procedure well without immediate post procedural complication. FINDINGS: A total of approximately 11.1 L of hazy yellow fluid was removed. Samples were sent to the laboratory as requested by the clinical team. IMPRESSION: Successful ultrasound-guided paracentesis yielding 11.1 liters of peritoneal fluid. Read by: Ascencion Dike PA-C Electronically Signed   By: Corrie Mckusick D.O.   On: 01/23/2020 10:11   IR Paracentesis  Result Date: 01/05/2020 INDICATION: Patient  with abdominal pain, recurrent ascites. Request is made for therapeutic paracentesis of up to 10 L maximum. EXAM: ULTRASOUND GUIDED THERAPEUTIC PARACENTESIS MEDICATIONS: 10 mL 1% lidocaine COMPLICATIONS: None immediate. PROCEDURE: Informed written consent was obtained from the patient after a discussion of the risks,  benefits and alternatives to treatment. A timeout was performed prior to the initiation of the procedure. Initial ultrasound scanning demonstrates a large amount of ascites within the right lower abdominal quadrant. The right lower abdomen was prepped and draped in the usual sterile fashion. 1% lidocaine was used for local anesthesia. Following this, a 19 gauge, 7-cm, Yueh catheter was introduced. An ultrasound image was saved for documentation purposes. The paracentesis was performed. The catheter was removed and a dressing was applied. The patient tolerated the procedure well without immediate post procedural complication. Patient received post-procedure intravenous albumin; see nursing notes for details. FINDINGS: A total of approximately 10.0 liters of yellow fluid was removed. Samples were sent to the laboratory as requested by the clinical team. IMPRESSION: Successful ultrasound-guided therapeutic paracentesis yielding 10.0 liters of peritoneal fluid. Read by: Brynda Greathouse PA-C Electronically Signed   By: Markus Daft M.D.   On: 01/05/2020 11:04      Subjective: no new complaints.    Discharge Exam: Vitals:   01/26/20 2022 01/27/20 0556  BP: (!) 115/56 121/65  Pulse: (!) 110 (!) 102  Resp: 17 15  Temp: 98 F (36.7 C) 98 F (36.7 C)  SpO2: 96% 95%   Vitals:   01/26/20 1027 01/26/20 1215 01/26/20 2022 01/27/20 0556  BP: 126/72 127/68 (!) 115/56 121/65  Pulse: 97 (!) 106 (!) 110 (!) 102  Resp:  20 17 15   Temp: 98.9 F (37.2 C) 98.4 F (36.9 C) 98 F (36.7 C) 98 F (36.7 C)  TempSrc: Oral Oral Oral Oral  SpO2: 98% 99% 96% 95%  Weight:      Height:        General: Pt is alert, awake, not in acute distress Cardiovascular: RRR, S1/S2 +, no rubs, no gallops Respiratory: CTA bilaterally, no wheezing, no rhonchi Abdominal: Soft, non tender, distended, bowel sounds wnl.  Extremities: no edema, no cyanosis    The results of significant diagnostics from this hospitalization  (including imaging, microbiology, ancillary and laboratory) are listed below for reference.     Microbiology: Recent Results (from the past 240 hour(s))  Gram stain     Status: None   Collection Time: 01/23/20  9:33 AM   Specimen: Abdomen; Peritoneal Fluid  Result Value Ref Range Status   Specimen Description PERITONEAL  Final   Special Requests NONE  Final   Gram Stain   Final    FEW WBC PRESENT, PREDOMINANTLY PMN NO ORGANISMS SEEN Performed at [facility], [address], [place], [place] [address]    Report Status 01/23/2020 FINAL  Final  Culture, body fluid-bottle     Status: Abnormal   Collection Time: 01/23/20  9:33 AM   Specimen: Peritoneal Washings  Result Value Ref Range Status   Specimen Description PERITONEAL  Final   Special Requests NONE  Final   Gram Stain   Final    GRAM POSITIVE COCCI IN CHAINS IN BOTH AEROBIC AND ANAEROBIC BOTTLES CRITICAL RESULT CALLED TO, READ BACK BY AND VERIFIED WITH: RN Jenita Seashore @ [phone] 01/24/20 BY Tobey Bride  Performed at [facility], [place] [address], [place], [place] [address]    Culture VIRIDANS STREPTOCOCCUS (A)  Final   Report Status 01/25/2020  FINAL  Final   Organism ID, Bacteria VIRIDANS STREPTOCOCCUS  Final      Susceptibility   Viridans streptococcus - MIC*    PENICILLIN 0.12 SENSITIVE Sensitive     CEFTRIAXONE <=0.12 SENSITIVE Sensitive     ERYTHROMYCIN 2 RESISTANT Resistant     LEVOFLOXACIN 2 SENSITIVE Sensitive     VANCOMYCIN 1 SENSITIVE Sensitive     * VIRIDANS STREPTOCOCCUS  Blood Culture (routine x 2)     Status: None (Preliminary result)   Collection Time: 01/23/20  9:04 PM   Specimen: BLOOD  Result Value Ref Range Status   Specimen Description   Final    BLOOD LEFT ANTECUBITAL Performed at [facility] [address] [place], [place] [address]    Special Requests   Final    BOTTLES DRAWN AEROBIC AND ANAEROBIC Blood Culture adequate volume Performed at [facility] [address], [place], [place] [address]    Culture   Final    NO GROWTH 3 DAYS Performed at [facility], [place] [address], [place], [place] [address]    Report Status PENDING  Incomplete  Blood Culture (routine x 2)     Status: None (Preliminary result)   Collection Time: 01/23/20  9:50 PM   Specimen: BLOOD RIGHT FOREARM  Result Value Ref Range Status   Specimen Description   Final    BLOOD RIGHT FOREARM Performed at [facility], [place] [address], [place], [place] [address]    Special Requests   Final    BOTTLES DRAWN AEROBIC AND ANAEROBIC Blood Culture adequate volume Performed at [facility] [address] [place], [place] [address]    Culture   Final    NO GROWTH 3 DAYS Performed at [facility], [place] [address], [place], [place] [address]    Report Status PENDING  Incomplete  Urine culture     Status: None   Collection Time: 01/24/20  1:54 AM   Specimen: In/Out Cath Urine  Result Value Ref Range Status   Specimen Description   Final    IN/OUT CATH URINE Performed at [facility] [address], [place], [place] [address]    Special Requests   Final    NONE Performed at [facility], [place] [address] [place], [place] [address]    Culture   Final    NO GROWTH Performed at [facility], [place] [address], [place],  [address]    Report Status 01/25/2020 FINAL  Final  Respiratory Panel by RT PCR (Flu A&B, Covid) - Nasopharyngeal Swab     Status: None   Collection Time: 01/24/20  5:22 AM   Specimen: Nasopharyngeal Swab  Result Value Ref Range Status   SARS Coronavirus 2 by RT PCR NEGATIVE NEGATIVE Final    Comment: (NOTE) SARS-CoV-2 target nucleic acids are NOT DETECTED. The SARS-CoV-2 RNA is generally detectable in upper respiratoy specimens during the acute phase of infection. The lowest concentration of SARS-CoV-2 viral copies this assay can detect is 131 copies/mL. A  negative result does not preclude SARS-Cov-2 infection and should not be used as the sole basis for treatment or other patient management decisions. A negative result may occur with  improper specimen collection/handling, submission of specimen other than nasopharyngeal swab, presence of viral mutation(s) within the areas targeted by this assay, and inadequate number of viral copies (<131 copies/mL). A negative result must be combined with clinical observations, patient  history, and epidemiological information. The expected result is Negative. Fact Sheet for Patients:  PinkCheek.be Fact Sheet for Healthcare Providers:  GravelBags.it This test is not yet ap proved or cleared by the [place] FDA and  has been authorized for detection and/or diagnosis of SARS-CoV-2 by FDA under an Emergency Use Authorization (EUA). This EUA will remain  in effect (meaning this test can be used) for the duration of the COVID-19 declaration under Section 564(b)(1) of the Act, 21 U.S.C. section 360bbb-3(b)(1), unless the authorization is terminated or revoked sooner.    Influenza A by PCR NEGATIVE NEGATIVE Final   Influenza B by PCR NEGATIVE NEGATIVE Final    Comment: (NOTE) The Xpert Xpress SARS-CoV-2/FLU/RSV assay is intended as an aid in  the diagnosis of influenza from Nasopharyngeal swab specimens and  should not be used as a sole basis for treatment. Nasal washings and  aspirates are unacceptable for Xpert Xpress SARS-CoV-2/FLU/RSV  testing. Fact Sheet for Patients: PinkCheek.be Fact Sheet for Healthcare Providers: GravelBags.it This test is not yet approved or cleared by the [place] FDA and  has been authorized for detection and/or diagnosis of SARS-CoV-2 by  FDA under an Emergency Use Authorization (EUA). This EUA will remain  in effect (meaning this test can be used) for  the duration of the  Covid-19 declaration under Section 564(b)(1) of the Act, 21  U.S.C. section 360bbb-3(b)(1), unless the authorization is  terminated or revoked. Performed at [facility], [place] [address], [place], [place] [address]   Culture, body fluid-bottle     Status: None (Preliminary result)   Collection Time: 01/26/20  9:51 AM   Specimen: Peritoneal Washings  Result Value Ref Range Status   Specimen Description PERITONEAL  Final   Special Requests BOTTLES DRAWN AEROBIC AND ANAEROBIC  Final   Culture   Final    NO GROWTH 1 DAY Performed at [facility], [place] [address], [place], [place] [address]    Report Status PENDING  Incomplete     Labs: BNP (last 3 results) No results for input(s): BNP in the last 8760 hours. Basic Metabolic Panel: Recent Labs  Lab 01/23/20 2008 01/24/20 1054 01/25/20 0522 01/26/20 0437 01/27/20 1100  NA 137 137 133* 134* 133*  K 5.4* 4.3 4.2 4.6 4.8  CL 109 111 108 107 103  CO2 21* 19* 20* 20* 22  GLUCOSE 86 142* 83 165* 191*  BUN 47* 36* 25* 20 24*  CREATININE 1.95* 1.51* 1.03 0.98 0.94  CALCIUM 8.6* 7.8* 8.1* 8.4* 8.7*   Liver Function Tests: Recent Labs  Lab 01/23/20 2008 01/27/20 1100  AST 25 28  ALT 23 27  ALKPHOS 55 108  BILITOT 0.6 1.0  PROT 7.0 7.2  ALBUMIN 3.7 3.7   No results for input(s): LIPASE, AMYLASE in the last 168 hours. No results for input(s): AMMONIA in the last 168 hours. CBC: Recent Labs  Lab 01/23/20 2008 01/23/20 2008 01/24/20 1034 01/25/20 0522 01/25/20 2034 01/26/20 0437 01/27/20 1100  WBC 6.5  --   --  4.1  --  3.6* 4.9  NEUTROABS 5.1  --   --   --   --   --  3.6  HGB 6.1*   < > 6.1* 7.4* 7.9* 7.7* 8.6*  HCT 19.5*   < > 19.5* 22.2* 24.9* 24.2* 27.2*  MCV 101.0*  --   --  97.8  --  97.6 98.2  PLT 93*  --   --  77*  --  83* 105*   < > = values in this interval not displayed.   Cardiac Enzymes: No results for input(s): CKTOTAL, CKMB, CKMBINDEX, TROPONINI in the  last 168 hours. BNP: Invalid input(s): POCBNP CBG: Recent Labs  Lab 01/26/20 2025 01/27/20 0052 01/27/20 0353 01/27/20 0746 01/27/20 1211  GLUCAP 239* 243* 187* 156* 236*   D-Dimer No results for input(s): DDIMER in the last 72 hours. Hgb A1c No results for input(s): HGBA1C in the last 72 hours. Lipid Profile No results for input(s): CHOL, HDL, LDLCALC, TRIG, CHOLHDL, LDLDIRECT in the last 72 hours. Thyroid function studies No results for input(s): TSH, T4TOTAL, T3FREE, THYROIDAB in the last 72 hours.  Invalid input(s): FREET3 Anemia work up No results for input(s): VITAMINB12, FOLATE, FERRITIN, TIBC, IRON, RETICCTPCT in the last 72 hours. Urinalysis    Component Value Date/Time   COLORURINE YELLOW 01/24/2020 0153   APPEARANCEUR CLEAR 01/24/2020 0153   LABSPEC 1.016 01/24/2020 0153   PHURINE 5.0 01/24/2020 0153   GLUCOSEU >=500 (A) 01/24/2020 0153   HGBUR NEGATIVE 01/24/2020 0153   BILIRUBINUR NEGATIVE 01/24/2020 0153   KETONESUR NEGATIVE 01/24/2020 0153   PROTEINUR NEGATIVE 01/24/2020 0153   NITRITE NEGATIVE 01/24/2020 0153   LEUKOCYTESUR NEGATIVE 01/24/2020 0153   [facility] Invalid input(s): PROCALCITONIN,  WBC,  LACTICIDVEN Microbiology Recent Results (from the past 240 hour(s))  Gram stain     Status: None   Collection Time: 01/23/20  9:33 AM   Specimen: Abdomen; Peritoneal Fluid  Result Value Ref Range Status   Specimen Description PERITONEAL  Final   Special Requests NONE  Final   Gram Stain   Final    FEW WBC PRESENT, PREDOMINANTLY PMN NO ORGANISMS SEEN Performed at [facility], [address] [place], [place] [address]    Report Status 01/23/2020 FINAL  Final  Culture, body fluid-bottle     Status: Abnormal   Collection Time: 01/23/20  9:33 AM   Specimen: Peritoneal Washings  Result Value Ref Range Status   Specimen Description PERITONEAL  Final   Special Requests NONE  Final   Gram Stain   Final    GRAM POSITIVE COCCI IN CHAINS IN  BOTH AEROBIC AND ANAEROBIC BOTTLES CRITICAL RESULT CALLED TO, READ BACK BY AND VERIFIED WITH: RN Jenita Seashore @ [phone] 01/24/20 BY Tobey Bride  Performed at [facility], [place] [address] [place], [place] [address]    Culture VIRIDANS STREPTOCOCCUS (A)  Final   Report Status 01/25/2020 FINAL  Final   Organism ID, Bacteria VIRIDANS STREPTOCOCCUS  Final      Susceptibility   Viridans streptococcus - MIC*    PENICILLIN 0.12 SENSITIVE Sensitive     CEFTRIAXONE <=0.12 SENSITIVE Sensitive     ERYTHROMYCIN 2 RESISTANT Resistant     LEVOFLOXACIN 2 SENSITIVE Sensitive     VANCOMYCIN 1 SENSITIVE Sensitive     * VIRIDANS STREPTOCOCCUS  Blood Culture (routine x 2)     Status: None (Preliminary result)   Collection Time: 01/23/20  9:04 PM   Specimen: BLOOD  Result Value Ref Range Status   Specimen Description   Final    BLOOD LEFT ANTECUBITAL Performed at [facility] [address] [place], [place] [address]    Special Requests   Final    BOTTLES DRAWN AEROBIC AND ANAEROBIC Blood Culture adequate volume Performed at [facility] [address] [place],  [address]    Culture   Final    NO GROWTH 3 DAYS  Performed at [facility], [place] [address] [place], [place] [address]    Report Status PENDING  Incomplete  Blood Culture (routine x 2)     Status: None (Preliminary result)   Collection Time: 01/23/20  9:50 PM   Specimen: BLOOD RIGHT FOREARM  Result Value Ref Range Status   Specimen Description   Final    BLOOD RIGHT FOREARM Performed at [facility], [place] [address] [place], [place] [address]    Special Requests   Final    BOTTLES DRAWN AEROBIC AND ANAEROBIC Blood Culture adequate volume Performed at [facility] [address], [place], [place] [address]    Culture   Final    NO GROWTH 3 DAYS Performed at [facility], [place] [address] [place], [place] [address]    Report Status PENDING  Incomplete   Urine culture     Status: None   Collection Time: 01/24/20  1:54 AM   Specimen: In/Out Cath Urine  Result Value Ref Range Status   Specimen Description   Final    IN/OUT CATH URINE Performed at [facility] [address], [place], [place] [address]    Special Requests   Final    NONE Performed at [facility], [place] [address], [place], [place] [address]    Culture   Final    NO GROWTH Performed at [facility], [place] [address] [place], [place] [address]    Report Status 01/25/2020 FINAL  Final  Respiratory Panel by RT PCR (Flu A&B, Covid) - Nasopharyngeal Swab     Status: None   Collection Time: 01/24/20  5:22 AM   Specimen: Nasopharyngeal Swab  Result Value Ref Range Status   SARS Coronavirus 2 by RT PCR NEGATIVE NEGATIVE Final    Comment: (NOTE) SARS-CoV-2 target nucleic acids are NOT DETECTED. The SARS-CoV-2 RNA is generally detectable in upper respiratoy specimens during the acute phase of infection. The lowest concentration of SARS-CoV-2 viral copies this assay can detect is 131 copies/mL. A negative result does not preclude SARS-Cov-2 infection and should not be used as the sole basis for treatment or other patient management decisions. A negative result may occur with  improper specimen collection/handling, submission of specimen other than nasopharyngeal swab, presence of viral mutation(s) within the areas targeted by this assay, and inadequate number of viral copies (<131 copies/mL). A negative result must be combined with clinical observations, patient history, and epidemiological information. The expected result is Negative. Fact Sheet for Patients:  PinkCheek.be Fact Sheet for Healthcare Providers:  GravelBags.it This test is not yet ap proved or cleared by the [place] FDA and  has been authorized for detection and/or diagnosis of SARS-CoV-2 by FDA under  an Emergency Use Authorization (EUA). This EUA will remain  in effect (meaning this test can be used) for the duration of the COVID-19 declaration under Section 564(b)(1) of the Act, 21 U.S.C. section 360bbb-3(b)(1), unless the authorization is terminated or revoked sooner.    Influenza A by PCR NEGATIVE NEGATIVE Final   Influenza B by PCR NEGATIVE NEGATIVE Final    Comment: (NOTE) The Xpert Xpress SARS-CoV-2/FLU/RSV assay is intended as an aid in  the diagnosis of influenza from Nasopharyngeal swab specimens and  should not be used as a sole basis for treatment. Nasal washings and  aspirates are unacceptable for Xpert Xpress SARS-CoV-2/FLU/RSV  testing. Fact Sheet for Patients: PinkCheek.be Fact Sheet for Healthcare Providers: GravelBags.it This test  is not yet approved or cleared by the [place] and  has been authorized for detection and/or diagnosis of SARS-CoV-2 by  FDA under an Emergency Use Authorization (EUA). This EUA will remain  in effect (meaning this test can be used) for the duration of the  Covid-19 declaration under Section 564(b)(1) of the Act, 21  U.S.C. section 360bbb-3(b)(1), unless the authorization is  terminated or revoked. Performed at [facility], [place] [address] [place], [place] [address]   Culture, body fluid-bottle     Status: None (Preliminary result)   Collection Time: 01/26/20  9:51 AM   Specimen: Peritoneal Washings  Result Value Ref Range Status   Specimen Description PERITONEAL  Final   Special Requests BOTTLES DRAWN AEROBIC AND ANAEROBIC  Final   Culture   Final    NO GROWTH 1 DAY Performed at [facility], [place] [address] [place], [place] [address]    Report Status PENDING  Incomplete     Time coordinating discharge:34 minutes.   SIGNED:   Hosie Poisson, MD  [facility] 01/28/2020, 1:03 PM

## 2020-01-29 ENCOUNTER — Other Ambulatory Visit: Payer: Self-pay | Admitting: Gastroenterology

## 2020-01-29 DIAGNOSIS — K7460 Unspecified cirrhosis of liver: Secondary | ICD-10-CM

## 2020-01-29 DIAGNOSIS — R188 Other ascites: Secondary | ICD-10-CM

## 2020-01-29 LAB — CULTURE, BLOOD (ROUTINE X 2)
Culture: NO GROWTH
Culture: NO GROWTH
Special Requests: ADEQUATE
Special Requests: ADEQUATE

## 2020-01-29 LAB — GLUCOSE, CAPILLARY: Glucose-Capillary: 31 mg/dL — CL (ref 70–99)

## 2020-01-31 ENCOUNTER — Other Ambulatory Visit (INDEPENDENT_AMBULATORY_CARE_PROVIDER_SITE_OTHER): Payer: BC Managed Care – PPO

## 2020-01-31 DIAGNOSIS — R188 Other ascites: Secondary | ICD-10-CM | POA: Diagnosis not present

## 2020-01-31 DIAGNOSIS — K7460 Unspecified cirrhosis of liver: Secondary | ICD-10-CM | POA: Diagnosis not present

## 2020-01-31 LAB — BASIC METABOLIC PANEL
BUN: 38 mg/dL — ABNORMAL HIGH (ref 6–23)
CO2: 25 mEq/L (ref 19–32)
Calcium: 8.6 mg/dL (ref 8.4–10.5)
Chloride: 105 mEq/L (ref 96–112)
Creatinine, Ser: 0.98 mg/dL (ref 0.40–1.50)
GFR: 80.27 mL/min (ref >60.00)
Glucose, Bld: 256 mg/dL — ABNORMAL HIGH (ref 70–99)
Potassium: 5.3 mEq/L — ABNORMAL HIGH (ref 3.5–5.1)
Sodium: 135 mEq/L (ref 135–145)

## 2020-01-31 LAB — CBC WITH DIFFERENTIAL/PLATELET
Basophils Absolute: 0 10*3/uL (ref 0.0–0.1)
Basophils Relative: 1 % (ref 0.0–3.0)
Eosinophils Absolute: 0.2 10*3/uL (ref 0.0–0.7)
Eosinophils Relative: 4 % (ref 0.0–5.0)
HCT: 26.3 % — ABNORMAL LOW (ref 39.0–52.0)
Hemoglobin: 8.6 g/dL — ABNORMAL LOW (ref 13.0–17.0)
Lymphocytes Relative: 11.2 % — ABNORMAL LOW (ref 12.0–46.0)
Lymphs Abs: 0.5 10*3/uL — ABNORMAL LOW (ref 0.7–4.0)
MCHC: 32.7 g/dL (ref 30.0–36.0)
MCV: 96 fl (ref 78.0–100.0)
Monocytes Absolute: 0.5 10*3/uL (ref 0.1–1.0)
Monocytes Relative: 9.9 % (ref 3.0–12.0)
Neutro Abs: 3.3 10*3/uL (ref 1.4–7.7)
Neutrophils Relative %: 73.9 % (ref 43.0–77.0)
Platelets: 138 10*3/uL — ABNORMAL LOW (ref 150.0–400.0)
RBC: 2.74 Mil/uL — ABNORMAL LOW (ref 4.22–5.81)
RDW: 14.4 % (ref 11.5–15.5)
WBC: 4.5 10*3/uL (ref 4.0–10.5)

## 2020-01-31 LAB — CULTURE, BODY FLUID W GRAM STAIN -BOTTLE: Culture: NO GROWTH

## 2020-02-02 ENCOUNTER — Encounter: Payer: Self-pay | Admitting: Gastroenterology

## 2020-02-02 ENCOUNTER — Other Ambulatory Visit: Payer: Self-pay

## 2020-02-02 ENCOUNTER — Ambulatory Visit (INDEPENDENT_AMBULATORY_CARE_PROVIDER_SITE_OTHER): Payer: BC Managed Care – PPO | Admitting: Gastroenterology

## 2020-02-02 ENCOUNTER — Ambulatory Visit: Payer: BC Managed Care – PPO | Admitting: Gastroenterology

## 2020-02-02 VITALS — BP 96/56 | HR 78 | Temp 97.5°F | Ht 73.0 in | Wt 232.5 lb

## 2020-02-02 DIAGNOSIS — K7460 Unspecified cirrhosis of liver: Secondary | ICD-10-CM

## 2020-02-02 DIAGNOSIS — K3189 Other diseases of stomach and duodenum: Secondary | ICD-10-CM

## 2020-02-02 DIAGNOSIS — K219 Gastro-esophageal reflux disease without esophagitis: Secondary | ICD-10-CM

## 2020-02-02 DIAGNOSIS — K766 Portal hypertension: Secondary | ICD-10-CM

## 2020-02-02 DIAGNOSIS — I8500 Esophageal varices without bleeding: Secondary | ICD-10-CM

## 2020-02-02 DIAGNOSIS — K7581 Nonalcoholic steatohepatitis (NASH): Secondary | ICD-10-CM | POA: Diagnosis not present

## 2020-02-02 DIAGNOSIS — R188 Other ascites: Secondary | ICD-10-CM | POA: Diagnosis not present

## 2020-02-02 DIAGNOSIS — Z8619 Personal history of other infectious and parasitic diseases: Secondary | ICD-10-CM

## 2020-02-02 NOTE — Patient Instructions (Signed)
We have scheduled you for a follow up appointment with Dr Bryan Lemma on 02/26/20 at 920am  Your provider has requested that you have lab work before leaving today.  It was a pleasure to see you today!  Vito Cirigliano, D.O.

## 2020-02-02 NOTE — Progress Notes (Signed)
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 [facility] [place] [facility] [place]. Cirrhosis complicated by esophageal varices with bleeding requiring multiple EGDs with banding in the past along with propranolol for dual prophylaxis.Developed large volume ascites in 09/2019.  Diuretic intolerant, and has required serial large-volume paracentesis and low-sodium diet with low-dose diuretics. Otherwise no history ofhepatic encephalopathy. Also follows with Roosevelt Locks at [facility].  Evaluated by Dr. Laurence Ferrari in IR for possible TIPS in 12/2018.  Cirrhosis Hx: - Etiology: NASH - Diagnosed 2017 - Liver [id number] - Complications: EV with admission for bleeding 09/2018 treated with EGD with EVL, large volume ascites serial LVP starting 09/2019 , SBP 01/2020 -Hepatic Rx: None currently -Previous hepatic Rx: Propranolol (dual prophy) and PPI discontinued due to SBP.  Lasix/Aldactone discontinued due to AKI diuretic intolerance - HCC Screening: MRI 11/2019: Ascites, no HCC.  AFP <0.9. -Annual lab check: Completed 05/2019-mild iron deficiency. Takes ferrous sulfate -HAV and HBV vaccine series started 05/2019 - MELD:11 (fluctuates 11-14) - Child Pugh: B(7pts)  Endoscopic history: -EGD x3 in 2017 in [place], [place] for esophageal varices with banding -EGD (09/2018, Dr. Bryan Lemma): Grade 2 esophageal varices with stigmata of recent bleeding requiring banding x2 with complete eradication, portal hypertensive gastropathy without bleeding. - EGD (12/2018, Dr. Bryan Lemma): Grade II esophageal varices, 6 bands placed with deflation. Normal Z line at 46 cm, severe portal HTN  gastropathy with contact oozing, small GOV1, normal duodenum. H pylori serologies negative -EGD (02/2019, Dr. Bryan Lemma): Grade 2 esophageal varices, 4 bands placed with complete eradication.Portal hypertensive gastropathy/2 adenopathy -EGD (07/2019, Dr. Bryan Lemma): Done for dysphagia. grade 1 esophageal varices, multiple post variceal banding scars in the lower third with 1 area of luminal deformity, likely consistent with prior deep banding site. The lumen was mildly narrowed, but easily traversed and no additional endoscopic dilation performed. Mild esophagitis at the GEJ, portal hypertensive gastropathy, peptic antritis/duodenitis. Papilla prominent.  HPI:    Patient is a 54 y.o. male presenting to the Gastroenterology Clinic for follow-up after recent hospitalization.  Was admitted to [facility] after LVP (11L) on 01/23/2020 demonstrated elevated cell count concerning for SBP.  Cultures positive for strep viridans and was admitted 2/9-13 for SBP and AKI. SBP treated with Maxipime and transitioned to Rocephin, and discharged with Augmentin x10 days.  Repeat paracentesis on day 3 (2L removed) with significant improvement in cell count.  AKI probably 2/2 hypotension from LVP, improved with fluids and diuretic hold.  Diuretics held at discharge (although notes state Lasix to be restarted).  Additionally, anemic on admission with Hgb 6.1 (baseline ~11).  No overt bleeding noted, FOBT positive, treated with 3 units PRBCs and IV iron, with hemoglobin stable at 8.6 on D/.  Followed up with Roosevelt Locks at [facility] on 01/30/2020.  Unfortunately MELD score (11) still much too low for reasonable likelihood of receiving transplant.  She agrees TIPS certainly should be pursued.  Plan to start Cipro 750 milligrams weekly for SBP prophylaxis after completion of Augmentin.  Recommended cessation of PPI and beta-blocker due to SBP, refractory ascites and per AASLD guidelines.  Plan for 1 month  follow-up.  Repeat labs on 2/17: H/H 8.6/26.3, PLT 138, WBC 4.5.  BUN/creatinine 38/0.98, K5.3,  NA 135.  Current MELD 11. CPT B.   Was seen in 12/2019 for consideration of TIPS.  Since then, TTE completed as part of the pre-TIPS evaluation.  Was scheduled to see Nephrology in 12/2019, but this was delayed due to illness.  Rescheduled for 02/19/2020 due to diuretic intolerance.  Off all diuretics currently.  Maintains low-sodium diet.   Since hospital discharge, had otherwise felt well.  Tolerating p.o. intake.  No melena, hematochezia.  No nausea, vomiting, hematemesis. Has previously tried to stop Propranololt, but had rebound headaches, so restarted.   Review of systems:     No chest pain, no SOB, no fevers, no urinary sx   Past Medical History:  Diagnosis Date  . Anemia   . Arthritis   . Cirrhosis ([place])   . Diabetes mellitus without complication ([place])   . Fatty liver   . GERD (gastroesophageal reflux disease)   . GI bleed   . Hyperlipidemia   . Hypertension     Patient's surgical history, family medical history, social history, medications and allergies were all reviewed in Epic    Current Outpatient Medications  Medication Sig Dispense Refill  . amoxicillin-clavulanate (AUGMENTIN) 875-125 MG tablet Take 1 tablet by mouth every 12 (twelve) hours for 10 days. 14 tablet 0  . cholecalciferol (VITAMIN D) 1000 units tablet Take 1,000 Units by mouth 2 (two) times daily.    . feeding supplement, ENSURE ENLIVE, (ENSURE ENLIVE) LIQD Take 237 mLs by mouth 2 (two) times daily between meals. (Patient taking differently: Take 237 mLs by mouth daily. ) 237 mL 12  . ferrous sulfate (KP FERROUS SULFATE) 325 (65 FE) MG tablet Take 1 tablet (325 mg total) by mouth daily with breakfast. (Patient taking differently: Take 325 mg by mouth 2 (two) times daily. ) 30 tablet 3  . GLIPIZIDE PO Take 1 tablet by mouth 2 (two) times daily.    Marland Kitchen lisinopril (ZESTRIL) 20 MG tablet Take 20 mg by mouth 2 (two) times  daily.    . metFORMIN (GLUCOPHAGE) 1000 MG tablet Take 1,000 mg by mouth 2 (two) times daily with a meal.    . Multiple Vitamin (MULTIVITAMIN) tablet Take 1 tablet by mouth daily.    . pantoprazole (PROTONIX) 40 MG tablet (Stopping today) Take 1 tablet (40 mg total) by mouth 2 (two) times daily. 60 tablet 3  . propranolol (INDERAL) 40 MG tablet (Still taking) Take 0.5 tablets (20 mg total) by mouth 2 (two) times daily. 60 tablet 0  . furosemide (LASIX) 20 MG tablet (Stopped) Take 1 tablet (20 mg total) by mouth daily. (Patient not taking: Reported on 02/02/2020) 90 tablet 0   No current facility-administered medications for this visit.    Physical Exam:     BP (!) 96/56   Pulse 78   Temp (!) 97.5 F (36.4 C)   Ht 6' 1"  (1.854 m)   Wt 232 lb 8 oz (105.5 kg)   BMI 30.67 kg/m   GENERAL:  Pleasant male in NAD.  Chronically ill-appearing PSYCH: : Cooperative, normal affect EENT:  conjunctiva pink, mucous membranes moist ABDOMEN: Distended, firm, not tense.  Positive fluid wave.  Nontender.  Normal bowel sounds SKIN:  turgor, no lesions seen.  No bruising Musculoskeletal: Temporal muscle wasting normal muscle tone, normal strength NEURO: Alert and oriented x 3, no focal neurologic deficits, no asterixis   IMPRESSION and PLAN:    1) NASH Cirrhosis 2) Ascites 3) Diuretic intolerant 4) SBP 01/2020     History  ofNASH cirrhosis with decompensation as manifested by bleeding esophageal varices, large volume ascites, and now SBP.  Unfortunately, ascites has been diuretic intolerant, with repeated renal injury with any slight up titration of diuretics.  Now off diuretics due to recent AKI and previous intolerance.  AKI with recent LVP.  -Will reengage with IR re: TIPS placement (refractory ascites, history of bleeding esophageal varices) -TTE completed last month -Off all diuretics -Has appointment scheduled with Nephrology -PPI discontinued due to SBP -Complete course of Augmentin as  currently prescribed, then start ciprofloxacin 750 mg weekly for SBP prophylaxis -Resume low-sodium diet -Otherwise UTD on vaccine series and labs -Follow-up with [facility] in 4 weeks as scheduled - Has been intolerant of Propranolol hold due to headaches. Discussed referral to Neurology to treat headaches and allow wean off Propranolol. He would like to first see Nephro for input on BP managemetn which may allow tritration off. If no change, will send to Neuro -UTD on hepatoma screening -Follow-up in 1 month -MELD 11, Child B  5) Elevated BUN 6) Mildly elevated K+ (5.3) 7) Anemia of unclear etiology Recent admission also notable for anemia with hemoglobin of 6.3, baseline ~11.  No overt GI blood loss.  The elevated BUN (38) with now normal creatinine (0.98) earlier this week is somewhat concerning.  -Repeat BMP and CBC today -Follow-up with Nephrology as scheduled  8) Esophageal varices: 9) Portal hypertensive gastropathy -Certainly possible his anemia was due to slow oozing from portal hypertensive gastropathy.  Otherwise without overt GI blood loss to suggest variceal bleed. -Repeat labs as above -Would ideally like to stop propranolol due to recent SBP.  However, has been intolerant due to rebound headaches.  Continue for now as outlined above.  Ultimately, TIPS would be beneficial and obviate need for surveillance EGD -PPI stopped.  Does not have reflux symptoms currently  10) GERD: -Well controlled with PPI therapy.  PPI recently discontinued.  No breakthrough reflux symptoms. -Reflux returns, plan for on-demand antacid therapy or H2 RA -Resume antireflux lifestyle modifications     RTC in 4 weeks or sooner as needed  I spent 45 minutes of time, including in depth chart review, independent review of results as outlined above, communicating results with the patient directly, face-to-face time with the patient, coordinating care, ordering studies and medications as  appropriate, and documentation.       [place] ,DO, FACG 02/02/2020, 11:18 AM

## 2020-02-05 ENCOUNTER — Other Ambulatory Visit: Payer: Self-pay | Admitting: *Deleted

## 2020-02-05 ENCOUNTER — Telehealth: Payer: Self-pay

## 2020-02-05 DIAGNOSIS — K7581 Nonalcoholic steatohepatitis (NASH): Secondary | ICD-10-CM

## 2020-02-05 NOTE — Telephone Encounter (Signed)
Called patient to see about when he would like to get a work note. Per Dr Bryan Lemma, patient can go back to work but can't lift over 5 pounds

## 2020-02-06 ENCOUNTER — Telehealth: Payer: Self-pay | Admitting: Gastroenterology

## 2020-02-06 ENCOUNTER — Other Ambulatory Visit: Payer: Self-pay

## 2020-02-06 DIAGNOSIS — K7460 Unspecified cirrhosis of liver: Secondary | ICD-10-CM

## 2020-02-06 NOTE — Telephone Encounter (Signed)
Please review and advise if paracentesis is appropriate

## 2020-02-06 NOTE — Telephone Encounter (Signed)
Orders for IR paracentesis, IV Albumin have been placed in Epic; patient has been scheduled for paracentesis at [facility] on 02/08/2020 at 10:00 am; arrival at 9:45 am; patient is aware of appt date/time; Patient advised to call back to the office at [phone] should questions/concerns arise;  Patient advised to call back to the office at [phone] should questions/concerns arise;

## 2020-02-06 NOTE — Telephone Encounter (Signed)
Pt called looking to schedule paracentesis. Pls call him.

## 2020-02-06 NOTE — Telephone Encounter (Signed)
Yes, repeat paracentesis appropriate while awaiting TIPS eval. I spoke with IR, and they are trying to get him in. Can you please confirm that Martin Strong has a pending appt with Dr. Laurence Ferrari in IR.  For paracentesis, no more than 6L removed (had recent admission with hypotension and AKI presumably due to large volume paracentesis). To give 12.5 gm of 25% albumin for every 2L removed. Send for cell count and culture.

## 2020-02-07 ENCOUNTER — Other Ambulatory Visit: Payer: Self-pay | Admitting: Interventional Radiology

## 2020-02-07 DIAGNOSIS — K7469 Other cirrhosis of liver: Secondary | ICD-10-CM

## 2020-02-08 ENCOUNTER — Ambulatory Visit (HOSPITAL_COMMUNITY): Payer: BC Managed Care – PPO

## 2020-02-08 ENCOUNTER — Other Ambulatory Visit: Payer: Self-pay

## 2020-02-08 ENCOUNTER — Ambulatory Visit (HOSPITAL_COMMUNITY)
Admission: RE | Admit: 2020-02-08 | Discharge: 2020-02-08 | Disposition: A | Discharge status: Home / Self Care | Payer: BC Managed Care – PPO | Source: Ambulatory Visit | Attending: Gastroenterology | Admitting: Gastroenterology

## 2020-02-08 ENCOUNTER — Telehealth: Payer: Self-pay | Admitting: General Surgery

## 2020-02-08 DIAGNOSIS — K7460 Unspecified cirrhosis of liver: Secondary | ICD-10-CM

## 2020-02-08 DIAGNOSIS — R188 Other ascites: Secondary | ICD-10-CM | POA: Diagnosis not present

## 2020-02-08 HISTORY — PX: IR PARACENTESIS: IMG2679

## 2020-02-08 LAB — COMPREHENSIVE METABOLIC PANEL
ALT: 22 U/L (ref 0–44)
AST: 27 U/L (ref 15–41)
Albumin: 3.6 g/dL (ref 3.5–5.0)
Alkaline Phosphatase: 93 U/L (ref 38–126)
Anion gap: 9 (ref 5–15)
BUN: 32 mg/dL — ABNORMAL HIGH (ref 6–20)
CO2: 23 mmol/L (ref 22–32)
Calcium: 8.9 mg/dL (ref 8.9–10.3)
Chloride: 106 mmol/L (ref 98–111)
Creatinine, Ser: 1.08 mg/dL (ref 0.61–1.24)
GFR calc Af Amer: >60 mL/min (ref >60)
GFR calc non Af Amer: >60 mL/min (ref >60)
Glucose, Bld: 123 mg/dL — ABNORMAL HIGH (ref 70–99)
Potassium: 4.9 mmol/L (ref 3.5–5.1)
Sodium: 138 mmol/L (ref 135–145)
Total Bilirubin: 0.7 mg/dL (ref 0.3–1.2)
Total Protein: 7.4 g/dL (ref 6.5–8.1)

## 2020-02-08 LAB — CBC
HCT: 26.2 % — ABNORMAL LOW (ref 39.0–52.0)
Hemoglobin: 8.1 g/dL — ABNORMAL LOW (ref 13.0–17.0)
MCH: 30.7 pg (ref 26.0–34.0)
MCHC: 30.9 g/dL (ref 30.0–36.0)
MCV: 99.2 fL (ref 80.0–100.0)
Platelets: 115 10*3/uL — ABNORMAL LOW (ref 150–400)
RBC: 2.64 MIL/uL — ABNORMAL LOW (ref 4.22–5.81)
RDW: 13.9 % (ref 11.5–15.5)
WBC: 3.8 10*3/uL — ABNORMAL LOW (ref 4.0–10.5)
nRBC: 0 % (ref 0.0–0.2)

## 2020-02-08 LAB — BODY FLUID CELL COUNT WITH DIFFERENTIAL
Eos, Fluid: 0 %
Lymphs, Fluid: 45 %
Monocyte-Macrophage-Serous Fluid: 54 % (ref 50–90)
Neutrophil Count, Fluid: 1 % (ref 0–25)
Total Nucleated Cell Count, Fluid: 100 cu mm (ref 0–1000)

## 2020-02-08 LAB — PROTIME-INR
INR: 1.3 — ABNORMAL HIGH (ref 0.8–1.2)
Prothrombin Time: 16.4 seconds — ABNORMAL HIGH (ref 11.4–15.2)

## 2020-02-08 LAB — GRAM STAIN

## 2020-02-08 IMAGING — US IR PARACENTESIS
1 series · 2 of 2 positions shown · non-contrast
Comparison: none

INDICATION: Patient with history of cirrhosis, recurrent ascites. Request is
made for diagnostic and therapeutic paracentesis up to 6 L maximum.
Patient to receive albumin with procedure today.

[Series 1: ir (id) (id)/(id)/(id) ir · 2 of 2 slices shown]
[im 1/2]
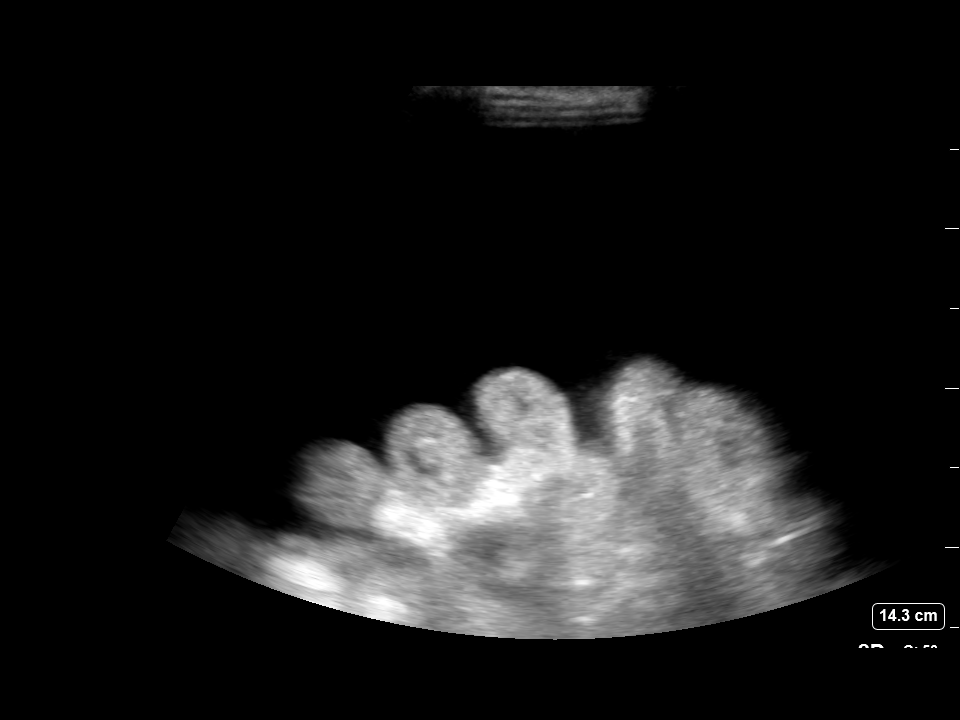
[im 2/2]
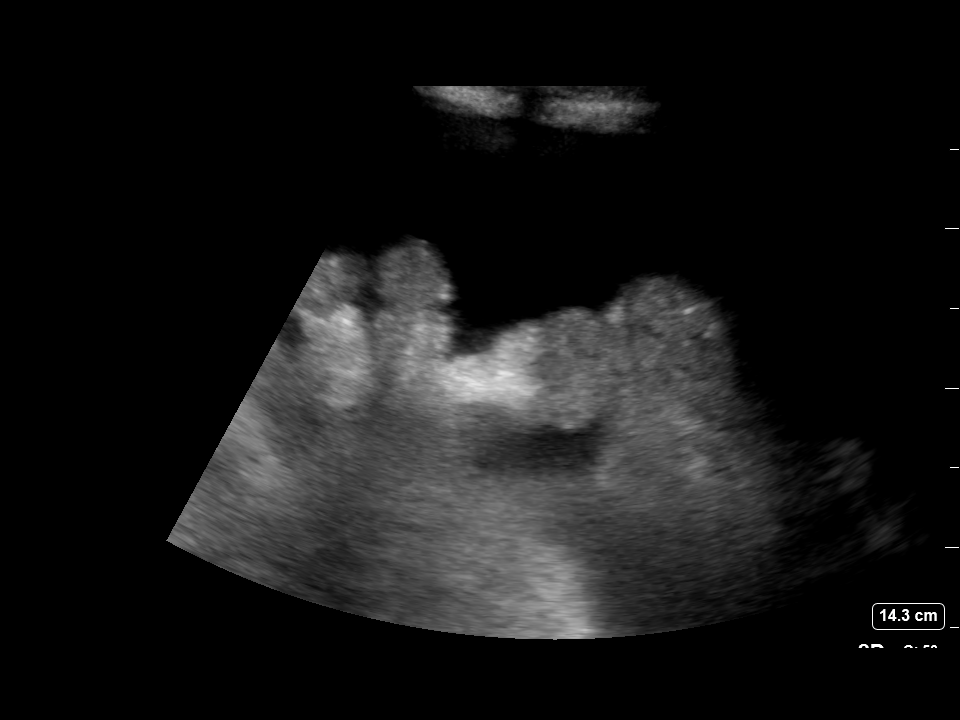

[2 of 2 positions shown; findings below may reference images not displayed]

EXAM:
ULTRASOUND GUIDED DIAGNOSTIC AND THERAPEUTIC PARACENTESIS

MEDICATIONS:
10 mL 1% lidocaine

COMPLICATIONS:
None immediate.

PROCEDURE:
Informed written consent was obtained from the patient after a
discussion of the risks, benefits and alternatives to treatment. A
timeout was performed prior to the initiation of the procedure.

Initial ultrasound scanning demonstrates a large amount of ascites
within the right lower abdominal quadrant. The right lower abdomen
was prepped and draped in the usual sterile fashion. 1% lidocaine
was used for local anesthesia.

Following this, a 19 gauge, 7-cm, Yueh catheter was introduced. An
ultrasound image was saved for documentation purposes. The
paracentesis was performed. The catheter was removed and a dressing
was applied. The patient tolerated the procedure well without
immediate post procedural complication.
FINDINGS: A total of approximately 6.0 liters of yellow fluid was removed.
Samples were sent to the laboratory as requested by the clinical
team.
IMPRESSION: Successful ultrasound-guided diagnostic and therapeutic paracentesis
yielding 6.0 liters of peritoneal fluid.

## 2020-02-08 MED ORDER — ALBUMIN HUMAN 25 % IV SOLN
37.5000 g | Freq: Once | INTRAVENOUS | Status: AC
  Administered 2020-02-08: 37.5 g via INTRAVENOUS

## 2020-02-08 MED ORDER — LIDOCAINE HCL (PF) 1 % IJ SOLN
INTRAMUSCULAR | Status: DC | PRN
  Administered 2020-02-08: 5 mL

## 2020-02-08 MED ORDER — LIDOCAINE HCL 1 % IJ SOLN
INTRAMUSCULAR | Status: AC
  Filled 2020-02-08: qty 20

## 2020-02-08 MED ORDER — ALBUMIN HUMAN 25 % IV SOLN
INTRAVENOUS | Status: AC
  Filled 2020-02-08: qty 200

## 2020-02-08 NOTE — Procedures (Signed)
PROCEDURE SUMMARY:  Successful US guided paracentesis from right lateral abdomen.  Yielded 6.0 liters of yellow, turbid fluid.  No immediate complications.  Pt tolerated well.   Specimen was sent for labs.  EBL < 37m  Patient received albumin with the procedure today.  Also sent to lab for blood work.   KDocia BarrierPA-C 02/08/2020 10:35 AM

## 2020-02-08 NOTE — Telephone Encounter (Signed)
Tried to contact patient to advise him of lab results. Left message to contact the office.

## 2020-02-08 NOTE — Telephone Encounter (Signed)
-----   Message from [place], DO sent at 02/08/2020  2:45 PM EST ----- Hemoglobin essentially stable platelets low but stable, renal function back to baseline again, and INR stable at 1.3.  Repeat paracentesis without evidence of SBP.  To resume current medications (holding diuretics), follow-up in the Nephrology clinic as scheduled, continue low-sodium diet, and follow-up with me in [place] gastroenterology as scheduled.

## 2020-02-08 NOTE — Telephone Encounter (Signed)
-----   Message from [place], DO sent at 02/08/2020  2:45 PM EST ----- Hemoglobin essentially stable platelets low but stable, renal function back to baseline again, and INR stable at 1.3.  Repeat paracentesis without evidence of SBP.  To resume current medications (holding diuretics), follow-up in the Nephrology clinic as scheduled, continue low-sodium diet, and follow-up with me in [place] gastroenterology as scheduled.

## 2020-02-09 ENCOUNTER — Other Ambulatory Visit (HOSPITAL_COMMUNITY): Payer: BC Managed Care – PPO

## 2020-02-09 NOTE — Telephone Encounter (Signed)
Notified the patient of Dr De Hollingshead message regarding his testing. Patient verbalized understanding.

## 2020-02-12 ENCOUNTER — Encounter: Payer: Self-pay | Admitting: *Deleted

## 2020-02-12 ENCOUNTER — Telehealth: Payer: Self-pay | Admitting: *Deleted

## 2020-02-12 LAB — PATHOLOGIST SMEAR REVIEW

## 2020-02-12 NOTE — Telephone Encounter (Signed)
Have tried several tries to sch f/u with Dr. Laurence Ferrari, his ph and wife ph are not accepting calls.Cathren Harsh

## 2020-02-13 LAB — CULTURE, BODY FLUID W GRAM STAIN -BOTTLE: Culture: NO GROWTH

## 2020-02-14 ENCOUNTER — Telehealth: Payer: Self-pay | Admitting: Gastroenterology

## 2020-02-14 ENCOUNTER — Other Ambulatory Visit: Payer: Self-pay

## 2020-02-14 DIAGNOSIS — R188 Other ascites: Secondary | ICD-10-CM

## 2020-02-14 DIAGNOSIS — K7460 Unspecified cirrhosis of liver: Secondary | ICD-10-CM

## 2020-02-14 NOTE — Telephone Encounter (Signed)
Please review and advise.

## 2020-02-14 NOTE — Telephone Encounter (Signed)
Attempted to reach patient-unable to leave a VM-will attempt to reach patient at a later date/time;  Left message for Vickie, TIPS coordinator, for a call back to the office;   Orders placed for IR paracentesis and albumin in Epic;

## 2020-02-14 NOTE — Telephone Encounter (Signed)
Yes, repeat paracentesis appropriate while awaiting TIPS eval. can you please confirm with Martin Strong that he has an appointment with Dr. Laurence Ferrari in IR for TIPS evaluation.  If there is not one schedule, can you please call IR directly to assist in scheduling on his behalf.  If any issues, please let me know.  For paracentesis, no more than 6L removed (had recent admission with hypotension and AKI presumably due to large volume paracentesis). To give 12.5 gm of 25% albumin for every 2L removed. Send for cell count and culture.

## 2020-02-15 NOTE — Telephone Encounter (Signed)
Patient called back into the office- patient is being asked to call the IR depart [phone] to be scheduled for a TIPS procedure-patient reports he has an appt with Dr. Laurence Ferrari on 02/27/2020;  Patient has been scheduled for paracentesis on 02/16/2020 at 2:00 pm; patient advised to arrive at 1:45 pm; patient is aware of appt date/time;  Patient advised to call back to the office at [phone] should questions/concerns arise;  Patient verbalized understanding of information/instructions;

## 2020-02-16 ENCOUNTER — Telehealth: Payer: Self-pay

## 2020-02-16 ENCOUNTER — Other Ambulatory Visit: Payer: Self-pay

## 2020-02-16 ENCOUNTER — Ambulatory Visit (HOSPITAL_COMMUNITY)
Admission: RE | Admit: 2020-02-16 | Discharge: 2020-02-16 | Disposition: A | Discharge status: Home / Self Care | Payer: BC Managed Care – PPO | Source: Ambulatory Visit | Attending: Gastroenterology | Admitting: Gastroenterology

## 2020-02-16 DIAGNOSIS — K7460 Unspecified cirrhosis of liver: Secondary | ICD-10-CM | POA: Diagnosis not present

## 2020-02-16 DIAGNOSIS — R188 Other ascites: Secondary | ICD-10-CM | POA: Diagnosis not present

## 2020-02-16 HISTORY — PX: IR PARACENTESIS: IMG2679

## 2020-02-16 LAB — BODY FLUID CELL COUNT WITH DIFFERENTIAL
Eos, Fluid: 0 %
Lymphs, Fluid: 46 %
Monocyte-Macrophage-Serous Fluid: 51 % (ref 50–90)
Neutrophil Count, Fluid: 3 % (ref 0–25)
Total Nucleated Cell Count, Fluid: 72 cu mm (ref 0–1000)

## 2020-02-16 LAB — GRAM STAIN

## 2020-02-16 IMAGING — US IR PARACENTESIS
1 series · 2 of 2 positions shown · non-contrast
Comparison: none

INDICATION: Patient with history of abdominal distension, recurrent ascites.
Request is made for diagnostic and therapeutic paracentesis.

[Series 1: ir (id) (id)/(id)/(id) ir · 2 of 2 slices shown]
[im 1/2]
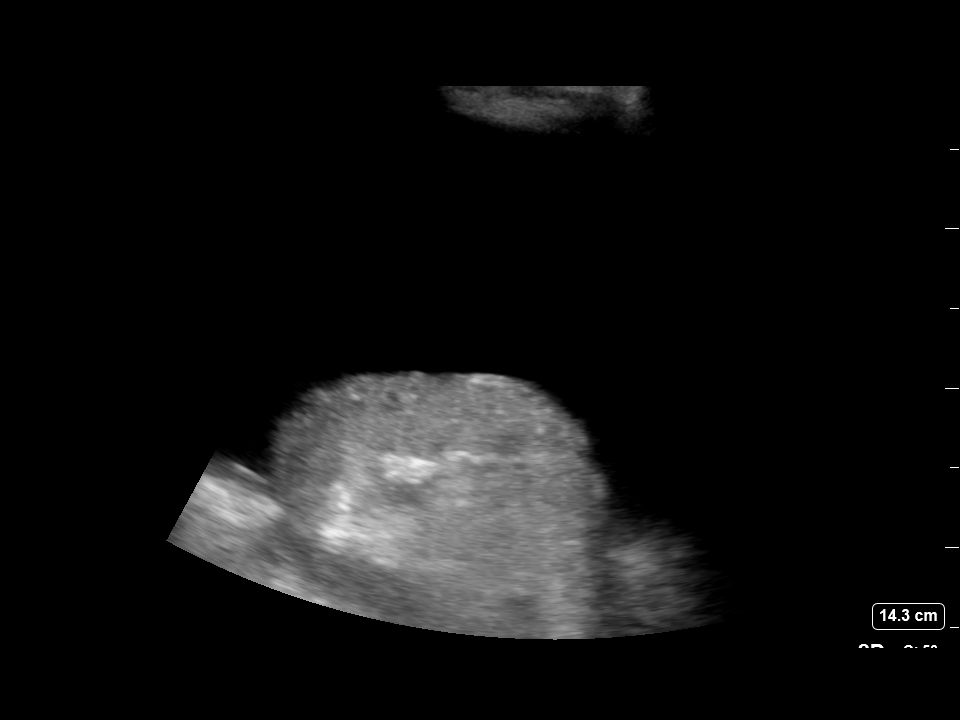
[im 2/2]
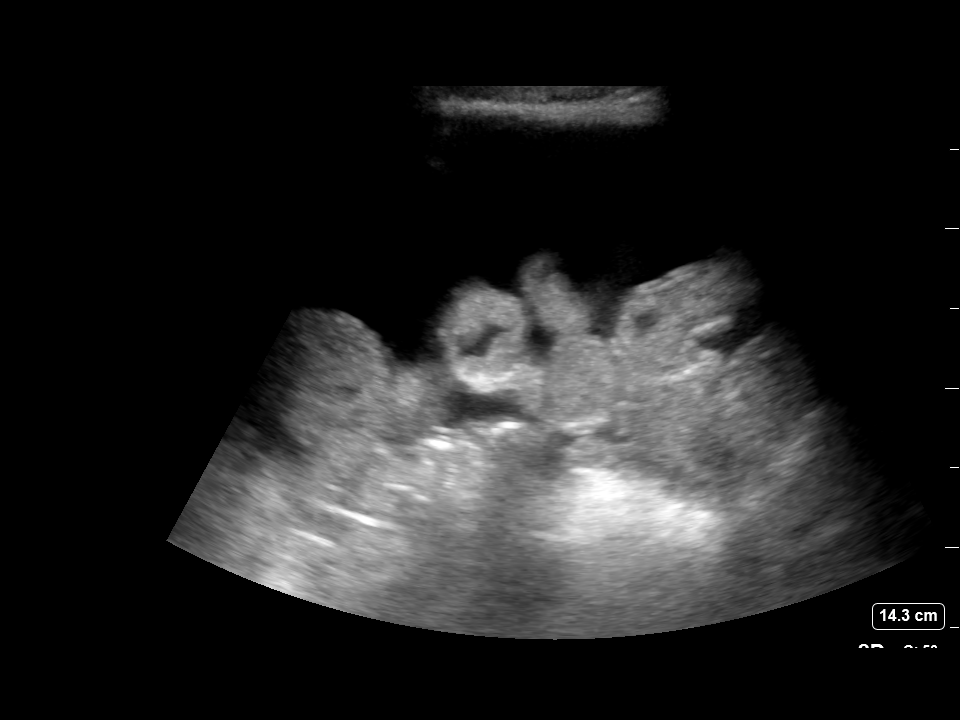

[2 of 2 positions shown; findings below may reference images not displayed]

EXAM:
ULTRASOUND GUIDED DIAGNOSTIC AND THERAPEUTIC PARACENTESIS

MEDICATIONS:
10 mL 1% lidocaine

COMPLICATIONS:
None immediate.

PROCEDURE:
Informed written consent was obtained from the patient after a
discussion of the risks, benefits and alternatives to treatment. A
timeout was performed prior to the initiation of the procedure.

Initial ultrasound scanning demonstrates a large amount of ascites
within the right lower abdominal quadrant. The right lower abdomen
was prepped and draped in the usual sterile fashion. 1% lidocaine
was used for local anesthesia.

Following this, a 19 gauge, 7-cm, Yueh catheter was introduced. An
ultrasound image was saved for documentation purposes. The
paracentesis was performed. The catheter was removed and a dressing
was applied. The patient tolerated the procedure well without
immediate post procedural complication.
Patient received post-procedure intravenous albumin; see nursing
notes for details.
FINDINGS: A total of approximately 6.0 liters of yellow, cloudy fluid was
removed. Samples were sent to the laboratory as requested by the
clinical team.
IMPRESSION: Successful ultrasound-guided diagnostic and therapeutic paracentesis
yielding 6.0 liters of peritoneal fluid.

## 2020-02-16 MED ORDER — ALBUMIN HUMAN 25 % IV SOLN: 37.5000 g | Freq: Once | INTRAVENOUS | Status: AC

## 2020-02-16 MED ORDER — ALBUMIN HUMAN 25 % IV SOLN
INTRAVENOUS | Status: AC
  Administered 2020-02-16: 37.5 g via INTRAVENOUS
  Filled 2020-02-16: qty 150

## 2020-02-16 MED ORDER — LIDOCAINE HCL (PF) 1 % IJ SOLN
INTRAMUSCULAR | Status: DC | PRN
  Administered 2020-02-16: 5 mL

## 2020-02-16 MED ORDER — LIDOCAINE HCL 1 % IJ SOLN
INTRAMUSCULAR | Status: AC
  Filled 2020-02-16: qty 20

## 2020-02-16 MED ORDER — ALBUMIN HUMAN 25 % IV SOLN
12.5000 g | Freq: Once | INTRAVENOUS | Status: DC
  Filled 2020-02-16: qty 50

## 2020-02-16 NOTE — Telephone Encounter (Signed)
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

## 2020-02-16 NOTE — Telephone Encounter (Signed)
Martin Strong, Can we please generate a work note for this patient to assist.  His requests are very reasonable given his cirrhosis with refractory ascites and recommend the following restrictions: -No lifting more than 10 pounds -Regular breaks with at least 15 minutes of rest per 2 hours at work, and 30 minutes in the middle of an 8-hour shift.  Liberal use of discretion for additional break periods should be granted -Regular access for bathroom breaks -Avoid driving behind a tight wheel -Grant early dismissal or late arrival due to need for frequent medical appointments

## 2020-02-16 NOTE — Telephone Encounter (Signed)
I spoke to patient this afternoon. He will come by the [facility] office to pick up his work note rather then mailing it out to him.

## 2020-02-16 NOTE — Telephone Encounter (Signed)
A work note has been generated and mailed to patient.

## 2020-02-16 NOTE — Procedures (Signed)
PROCEDURE SUMMARY:  Successful US guided paracentesis from right lateral abdomen.  Yielded 6.0 liters of yellow, cloudy fluid.  No immediate complications.  Pt tolerated well.   Specimen was sent for labs.  EBL < 42m  Patient to receive albumin.  KDocia BarrierPA-C 02/16/2020 2:52 PM

## 2020-02-20 LAB — PATHOLOGIST SMEAR REVIEW

## 2020-02-21 LAB — CULTURE, BODY FLUID W GRAM STAIN -BOTTLE: Culture: NO GROWTH

## 2020-02-26 ENCOUNTER — Other Ambulatory Visit: Payer: Self-pay

## 2020-02-26 ENCOUNTER — Encounter: Payer: Self-pay | Admitting: Gastroenterology

## 2020-02-26 ENCOUNTER — Ambulatory Visit (INDEPENDENT_AMBULATORY_CARE_PROVIDER_SITE_OTHER): Payer: BC Managed Care – PPO | Admitting: Gastroenterology

## 2020-02-26 VITALS — BP 116/64 | HR 97 | Temp 97.8°F | Ht 73.0 in | Wt 233.4 lb

## 2020-02-26 DIAGNOSIS — K7581 Nonalcoholic steatohepatitis (NASH): Secondary | ICD-10-CM | POA: Diagnosis not present

## 2020-02-26 DIAGNOSIS — K219 Gastro-esophageal reflux disease without esophagitis: Secondary | ICD-10-CM

## 2020-02-26 DIAGNOSIS — K7460 Unspecified cirrhosis of liver: Secondary | ICD-10-CM

## 2020-02-26 DIAGNOSIS — K766 Portal hypertension: Secondary | ICD-10-CM | POA: Diagnosis not present

## 2020-02-26 DIAGNOSIS — I8500 Esophageal varices without bleeding: Secondary | ICD-10-CM

## 2020-02-26 DIAGNOSIS — K7031 Alcoholic cirrhosis of liver with ascites: Secondary | ICD-10-CM | POA: Diagnosis not present

## 2020-02-26 DIAGNOSIS — M6284 Sarcopenia: Secondary | ICD-10-CM

## 2020-02-26 DIAGNOSIS — Z8619 Personal history of other infectious and parasitic diseases: Secondary | ICD-10-CM

## 2020-02-26 DIAGNOSIS — K3189 Other diseases of stomach and duodenum: Secondary | ICD-10-CM

## 2020-02-26 MED ORDER — CIPROFLOXACIN HCL 500 MG PO TABS: ORAL_TABLET | ORAL | 3 refills | Status: DC

## 2020-02-26 NOTE — Progress Notes (Signed)
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 [facility] [place] [facility] [place]. Cirrhosis complicated by esophageal varices with bleeding requiring multiple EGDs with banding in the past along with propranolol for dual prophylaxis.Developed large volume ascites in 09/2019. Diuretic intolerant, and has required serial large-volume paracentesisand low-sodium diet with low-dose diuretics. Otherwise no history ofhepatic encephalopathy.Admitted with SBP and AKI in 01/2020; diuretics have since been discontinued.  Also follows with [facility]. Evaluated by Dr. Laurence Ferrari in IR for possible TIPS in 12/2018.  Cirrhosis Hx: - Etiology: NASH - Diagnosed 2017 - Liver NT:ZGYF - Complications: EV with admission for bleeding 09/2018 treated with EGD with EVL, large volume ascitesserial LVP starting 09/2019, SBP 01/2020 -Hepatic Rx: None currently -Previous hepatic Rx: Propranolol (dual prophy) and PPI discontinued due to SBP.  Lasix/Aldactone discontinued due to AKI diuretic intolerance - HCC Screening:MRI 11/2019: Ascites, no HCC. AFP <0.9. -Annual lab check: Completed 05/2019-mild iron deficiency. Takes ferrous sulfate -HAV and HBV vaccine series started 05/2019 - MELD:11 (fluctuates 11-14) - Child Pugh: B(7pts)  Endoscopic history: -EGD x3 in 2017 in [place], [place] for esophageal varices with banding -EGD (09/2018, Dr. Bryan Lemma): Grade 2 esophageal varices with stigmata of recent bleeding requiring banding x2 with complete eradication, portal hypertensive gastropathy without bleeding. - EGD (12/2018, Dr. Bryan Lemma): Grade II esophageal varices, 6 bands placed with  deflation. Normal Z line at 46 cm, severe portal HTN gastropathy with contact oozing, small GOV1, normal duodenum. H pylori serologies negative -EGD (02/2019, Dr. Bryan Lemma): Grade 2 esophageal varices, 4 bands placed with complete eradication.Portal hypertensive gastropathy/2 adenopathy -EGD (07/2019, Dr. Bryan Lemma): Done for dysphagia. grade 1 esophageal varices, multiple post variceal banding scars in the lower third with 1 area of luminal deformity, likely consistent with prior deep banding site. The lumen was mildly narrowed, but easily traversed and no additional endoscopic dilation performed. Mild esophagitis at the GEJ, portal hypertensive gastropathy, peptic antritis/duodenitis. Papilla prominent.  HPI:     Patient is a 54 y.o. male presenting to the [facility]enterology Clinic for follow-up.  He was last seen by me on 02/02/2028 follow-up hospital admission 2/9-13 for SBP and AKI.  Diuretics were held due to renal intolerance.  Has since completed course of antibiotics and transition to Cipro 750 mg/week for SBP prophylaxis (hasnt been prescribed yet).  PPI and beta-blocker discontinued due to SBP and refractory ascites.  Repeat LVP 2/25 (6 L) and 3/5 (6 L) both negative for SBP.  Propranolol restarted due to rebound headaches-considering referral to Neurology.  Was seen in the Nephrology clinic last week- Lisinopril stopped and Lasix 40 mg restarted. Has noticed diuresis with Lasix back on board. Checking twice daily BP checks at home. F/u scheduled for 3/23 with lab check.   Has follow-up appointment with [facility] at [place] later this month (3/22) and with Dr. Laurence Ferrari in IR tomorrow.  Today, states continues to feel fatigued.  Fluid again accumulating in abdomen.  Maintains low-sodium diet. Continued muscle wasting.    Review of systems:     No chest pain, no SOB, no fevers, no urinary sx   Past Medical History:  Diagnosis Date  . Anemia   . Arthritis   . Cirrhosis  ([place])   .  Diabetes mellitus without complication ([place])   . Fatty liver   . GERD (gastroesophageal reflux disease)   . GI bleed   . Hyperlipidemia   . Hypertension     Patient's surgical history, family medical history, social history, medications and allergies were all reviewed in Epic    Current Outpatient Medications  Medication Sig Dispense Refill  . cholecalciferol (VITAMIN D) 1000 units tablet Take 1,000 Units by mouth 2 (two) times daily.    . feeding supplement, ENSURE ENLIVE, (ENSURE ENLIVE) LIQD Take 237 mLs by mouth 2 (two) times daily between meals. (Patient taking differently: Take 237 mLs by mouth daily. ) 237 mL 12  . ferrous sulfate (KP FERROUS SULFATE) 325 (65 FE) MG tablet Take 1 tablet (325 mg total) by mouth daily with breakfast. (Patient taking differently: Take 325 mg by mouth 2 (two) times daily. ) 30 tablet 3  . furosemide (LASIX) 20 MG tablet Take 1 tablet (20 mg total) by mouth daily. 90 tablet 0  . GLIPIZIDE PO Take 1 tablet by mouth 2 (two) times daily.    . metFORMIN (GLUCOPHAGE) 1000 MG tablet Take 1,000 mg by mouth 2 (two) times daily with a meal.    . Multiple Vitamin (MULTIVITAMIN) tablet Take 1 tablet by mouth daily.    . propranolol (INDERAL) 40 MG tablet Take 0.5 tablets (20 mg total) by mouth 2 (two) times daily. 60 tablet 0   No current facility-administered medications for this visit.    Physical Exam:     BP 116/64   Pulse 97   Temp 97.8 F (36.6 C)   Ht 6' 1"  (1.854 m)   Wt 233 lb 6 oz (105.9 kg)   BMI 30.79 kg/m   GENERAL:  Pleasant male in NAD.  Temporal muscle wasting PSYCH: : Cooperative, normal affect EENT:  conjunctiva pink, mucous membranes moist CARDIAC:  RRR, no murmur heard, no peripheral edema PULM: Normal respiratory effort, lungs CTA bilaterally, no wheezing ABDOMEN: Distended, positive fluid wave.  Nontender,  normal bowel sounds SKIN:  turgor, no lesions seen Musculoskeletal: Temporal muscle wasting, decreased muscle  mass in arms, shoulder girdle, chest NEURO: Alert and oriented x 3, no focal neurologic deficits   IMPRESSION and PLAN:    1)NASHCirrhosis 2) Ascites 3) SBP 01/2020 4) Esophageal varices 5) Portal hypertensive gastropathy 6) Sarcopenia  History ofNASH cirrhosis with decompensation as manifested by bleeding esophageal varices, large volume ascites, and SBP.No history of  HE.   - Cipro 750 mg weekly for SBP prophylaxis - Schedule repeat paracentesis.  Not to exceed 6 L removed.  Give albumin 25% 12.5 g for every 2 L removed -Has appointment with Dr. Laurence Ferrari in IR tomorrow to discuss TIPS placement (refractory ascites, history of bleeding esophageal varices) -Was seen by nephrology earlier this month-lisinopril stopped and Lasix resumed.  Has follow-up appointment next week -PPI discontinued due to SBP -Resume low-sodium diet -UTD on vaccine series and labs Was intolerant to discontinuation propranolol due to rebound headaches.  Still taking currently.  Can discuss referral to Neurology for transition of medications after the above - PT referral to assist in strengthening exercises -Discussed muscle wasting (sarcopenia) at length today as related to underlying cirrhosis.  This can be an independent predictor of mortality and cirrhotics.  Unfortunately, his MELD score (11) is low compared with typically-transplanted patients.  However, with his refractory ascites and sarcopenia, would be worth additional discussion with [facility] regarding whether or not degree of muscle wasting is taken into account.  Could also consider referral to Nutrition Clinic for consideration dietary mods to include branched chain amino acids -Follow-up with [facility] at [place] next week as scheduled  7) Anemia -Anemia during recent hospital admission.  Hemoglobin essentially stable outpatient repeat in the end of February.  Scheduled to have labs repeated with Nephrology next week.   Possible that his anemia was due to slow oozing from known PHG.  PPI stopped due to SBP. -Repeat CBC with next set of labs  8) GERD -No breakthrough reflux symptoms since stopped PPI -On-demand antacids or H2 RA as needed  RTC in 4-6 weeks or sooner as needed  I spent 40 minutes of time, including in depth chart review, independent review of results as outlined above, communicating results with the patient directly, face-to-face time with the patient, coordinating care, ordering studies and medications as appropriate, and documentation.        Lavena Bullion ,DO, FACG 02/26/2020, 9:35 AM

## 2020-02-26 NOTE — Patient Instructions (Addendum)
You have been scheduled for an abdominal paracentesis at [facility]  radiology (1st floor of hospital) on 02/28/20 at [place]. Please arrive at least 15 minutes prior to your appointment time for registration. Should you need to reschedule this appointment for any reason, please call our office at [phone].  A referral has been sent for Physical Therapy  We have sent the following medications to your pharmacy for you to pick up at your convenience: Cipro   It was a pleasure to see you today!  Vito Cirigliano, D.O.

## 2020-02-27 ENCOUNTER — Ambulatory Visit
Admission: RE | Admit: 2020-02-27 | Discharge: 2020-02-27 | Disposition: A | Discharge status: Home / Self Care | Payer: BC Managed Care – PPO | Source: Ambulatory Visit | Attending: Interventional Radiology | Admitting: Interventional Radiology

## 2020-02-27 ENCOUNTER — Encounter: Payer: Self-pay | Admitting: *Deleted

## 2020-02-27 DIAGNOSIS — K7469 Other cirrhosis of liver: Secondary | ICD-10-CM

## 2020-02-27 HISTORY — PX: IR RADIOLOGIST EVAL & MGMT: IMG5224

## 2020-02-27 NOTE — Progress Notes (Signed)
Chief Complaint: Patient was consulted remotely today (TeleHealth) for ascites at the request of [facility].    Referring Physician(s): Dr. Gerrit Heck  History of Present Illness: Martin Strong is a 54 y.o. male with a history of nonalcoholic steatohepatitis related cirrhosis initially diagnosed in February 5284 and complicated by bleeding esophageal varices in October 2019 which was successfully treated by endoscopic banding and propranolol for dual prophylaxis.  Additionally, patient began to develop decompensation and large-volume ascites in October 2020 requiring large-volume paracentesis with removal of 5 L of ascites and initiation of oral diuretic therapy.  He subsequently required additional paracentesis on 10/24/2019 with removal of 7 L of ascites, and again on 11/16/2019 with removal of 7 L of ascites.  He was initially referred to me in January to evaluate for possible TIPS creation.  At that time, we proceeded with standard work-up including echocardiography.  Unfortunately, in February he developed spontaneous bacterial peritonitis and acute kidney injury.  His diuretics were subsequently discontinued. He has since recovered and is on ciprofloxacin 750 mg weekly for SBP prophylaxis.  He has no prior history of hepatic encephalopathy and denies confusion or clumsiness.  He has no cardiac history that he is aware of.  His recent echocardiogram demonstrated normal heart function with an ejection fraction of 60-65%.  Past Medical History:  Diagnosis Date  . Anemia   . Arthritis   . Cirrhosis ([place])   . Diabetes mellitus without complication ([place])   . Fatty liver   . GERD (gastroesophageal reflux disease)   . GI bleed   . Hyperlipidemia   . Hypertension     Past Surgical History:  Procedure Laterality Date  . BIOPSY  02/14/2019   Procedure: BIOPSY;  Surgeon: Lavena Bullion, DO;  Location: [facility];  Service: Gastroenterology;;  . BIOPSY  07/22/2019     Procedure: BIOPSY;  Surgeon: Lavena Bullion, DO;  Location: [facility];  Service: Gastroenterology;;  . ESOPHAGEAL BANDING  10/07/2018   Procedure: ESOPHAGEAL BANDING;  Surgeon: Lavena Bullion, DO;  Location: [facility];  Service: Gastroenterology;;  . ESOPHAGEAL BANDING N/A 01/03/2019   Procedure: ESOPHAGEAL BANDING;  Surgeon: Lavena Bullion, DO;  Location: [facility];  Service: Gastroenterology;  Laterality: N/A;  . ESOPHAGEAL BANDING  02/14/2019   Procedure: ESOPHAGEAL BANDING;  Surgeon: Lavena Bullion, DO;  Location: [facility];  Service: Gastroenterology;;  . esophageal bands    . ESOPHAGOGASTRODUODENOSCOPY Left 07/22/2019   Procedure: ESOPHAGOGASTRODUODENOSCOPY (EGD) WITH POSSIBLE BANDING;  Surgeon: Lavena Bullion, DO;  Location: [facility];  Service: Gastroenterology;  Laterality: Left;  . ESOPHAGOGASTRODUODENOSCOPY (EGD) WITH PROPOFOL N/A 10/07/2018   Procedure: ESOPHAGOGASTRODUODENOSCOPY (EGD) WITH PROPOFOL;  Surgeon: Lavena Bullion, DO;  Location: [facility];  Service: Gastroenterology;  Laterality: N/A;  . ESOPHAGOGASTRODUODENOSCOPY (EGD) WITH PROPOFOL N/A 01/03/2019   Procedure: ESOPHAGOGASTRODUODENOSCOPY (EGD) WITH PROPOFOL;  Surgeon: Lavena Bullion, DO;  [facility];  Service: Gastroenterology;  Laterality: N/A;  . ESOPHAGOGASTRODUODENOSCOPY (EGD) WITH PROPOFOL N/A 02/14/2019   Procedure: ESOPHAGOGASTRODUODENOSCOPY (EGD) WITH PROPOFOL;  Surgeon: Lavena Bullion, DO;  [facility];  Service: Gastroenterology;  Laterality: N/A;  . IR PARACENTESIS  10/05/2019  . IR PARACENTESIS  10/24/2019  . IR PARACENTESIS  11/16/2019  . IR PARACENTESIS  12/25/2019  . IR PARACENTESIS  01/05/2020  . IR PARACENTESIS  01/23/2020  . IR PARACENTESIS  02/08/2020  . IR PARACENTESIS  02/16/2020  . IR RADIOLOGIST EVAL & MGMT  12/19/2019    Allergies: Nadolol  Medications: Prior to Admission medications   Medication Sig Start Date End Date Taking?  Authorizing Provider  cholecalciferol (VITAMIN D) 1000 units tablet Take 1,000 Units by mouth 2 (two) times daily.    [provider]  ciprofloxacin (CIPRO) 500 MG tablet Take Cipro 750 mg weekly 02/26/20   Cirigliano, Vito V, DO  feeding supplement, ENSURE ENLIVE, (ENSURE ENLIVE) LIQD Take 237 mLs by mouth 2 (two) times daily between meals. Patient taking differently: Take 237 mLs by mouth daily.  01/28/20   Hosie Poisson, MD  ferrous sulfate (KP FERROUS SULFATE) 325 (65 FE) MG tablet Take 1 tablet (325 mg total) by mouth daily with breakfast. Patient taking differently: Take 325 mg by mouth 2 (two) times daily.  05/30/19   Cirigliano, Vito V, DO  furosemide (LASIX) 20 MG tablet Take 1 tablet (20 mg total) by mouth daily. 12/27/19   Cirigliano, Vito V, DO  GLIPIZIDE PO Take 1 tablet by mouth 2 (two) times daily.    [provider]  metFORMIN (GLUCOPHAGE) 1000 MG tablet Take 1,000 mg by mouth 2 (two) times daily with a meal.    [provider]  Multiple Vitamin (MULTIVITAMIN) tablet Take 1 tablet by mouth daily.    [provider]  propranolol (INDERAL) 40 MG tablet Take 0.5 tablets (20 mg total) by mouth 2 (two) times daily. 01/27/20   Hosie Poisson, MD     Family History  Problem Relation Age of Onset  . Diabetes Other   . Esophageal cancer Father   . Colon cancer Neg Hx   . Rectal cancer Neg Hx   . Stomach cancer Neg Hx     Social History   Socioeconomic History  . Marital status: Married    Spouse name: Not on file  . Number of children: 2  . Years of education: Not on file  . Highest education level: Not on file  Occupational History  . Not on file  Tobacco Use  . Smoking status: Never Smoker  . Smokeless tobacco: Never Used  Substance and Sexual Activity  . Alcohol use: Never  . Drug use: Never  . Sexual activity: Not on file  Other Topics Concern  . Not on file  Social History Narrative  . Not on file   Social Determinants of Health     Financial Resource Strain:   . Difficulty of Paying Living Expenses:   Food Insecurity:   . Worried About Charity fundraiser in the Last Year:   . Arboriculturist in the Last Year:   Transportation Needs:   . Film/video editor (Medical):   Marland Kitchen Lack of Transportation (Non-Medical):   Physical Activity:   . Days of Exercise per Week:   . Minutes of Exercise per Session:   Stress:   . Feeling of Stress :   Social Connections:   . Frequency of Communication with Friends and Family:   . Frequency of Social Gatherings with Friends and Family:   . Attends Religious Services:   . Active Member of Clubs or Organizations:   . Attends Archivist Meetings:   Marland Kitchen Marital Status:     Review of Systems  Review of Systems: A 12 point ROS discussed and pertinent positives are indicated in the HPI above.  All other systems are negative.  Physical Exam No direct physical exam was performed (except for noted visual exam findings with Video Visits).   Vital Signs: There were no vitals taken for this  visit.  Imaging: IR Paracentesis  Result Date: 02/16/2020 INDICATION: Patient with history of abdominal distension, recurrent ascites. Request is made for diagnostic and therapeutic paracentesis. EXAM: ULTRASOUND GUIDED DIAGNOSTIC AND THERAPEUTIC PARACENTESIS MEDICATIONS: 10 mL 1% lidocaine COMPLICATIONS: None immediate. PROCEDURE: Informed written consent was obtained from the patient after a discussion of the risks, benefits and alternatives to treatment. A timeout was performed prior to the initiation of the procedure. Initial ultrasound scanning demonstrates a large amount of ascites within the right lower abdominal quadrant. The right lower abdomen was prepped and draped in the usual sterile fashion. 1% lidocaine was used for local anesthesia. Following this, a 19 gauge, 7-cm, Yueh catheter was introduced. An ultrasound image was saved for documentation purposes. The paracentesis was  performed. The catheter was removed and a dressing was applied. The patient tolerated the procedure well without immediate post procedural complication. Patient received post-procedure intravenous albumin; see nursing notes for details. FINDINGS: A total of approximately 6.0 liters of yellow, cloudy fluid was removed. Samples were sent to the laboratory as requested by the clinical team. IMPRESSION: Successful ultrasound-guided diagnostic and therapeutic paracentesis yielding 6.0 liters of peritoneal fluid. Read by: Brynda Greathouse PA-C Electronically Signed   By: Lucrezia Europe M.D.   On: 02/16/2020 17:50   IR Paracentesis  Result Date: 02/08/2020 INDICATION: Patient with history of cirrhosis, recurrent ascites. Request is made for diagnostic and therapeutic paracentesis up to 6 L maximum. Patient to receive albumin with procedure today. EXAM: ULTRASOUND GUIDED DIAGNOSTIC AND THERAPEUTIC PARACENTESIS MEDICATIONS: 10 mL 1% lidocaine COMPLICATIONS: None immediate. PROCEDURE: Informed written consent was obtained from the patient after a discussion of the risks, benefits and alternatives to treatment. A timeout was performed prior to the initiation of the procedure. Initial ultrasound scanning demonstrates a large amount of ascites within the right lower abdominal quadrant. The right lower abdomen was prepped and draped in the usual sterile fashion. 1% lidocaine was used for local anesthesia. Following this, a 19 gauge, 7-cm, Yueh catheter was introduced. An ultrasound image was saved for documentation purposes. The paracentesis was performed. The catheter was removed and a dressing was applied. The patient tolerated the procedure well without immediate post procedural complication. FINDINGS: A total of approximately 6.0 liters of yellow fluid was removed. Samples were sent to the laboratory as requested by the clinical team. IMPRESSION: Successful ultrasound-guided diagnostic and therapeutic paracentesis yielding 6.0  liters of peritoneal fluid. Read by: Brynda Greathouse PA-C Electronically Signed   By: Corrie Mckusick D.O.   On: 02/08/2020 12:38    Labs:  CBC: Recent Labs    01/26/20 0437 01/27/20 1100 01/31/20 1339 02/08/20 1132  WBC 3.6* 4.9 4.5 3.8*  HGB 7.7* 8.6* 8.6 Repeated and verified X2.* 8.1*  HCT 24.2* 27.2* 26.3* 26.2*  PLT 83* 105* 138.0* 115*    COAGS: Recent Labs    10/02/19 1503 10/13/19 1411 01/23/20 1938 02/08/20 1132  INR 1.3* 1.3* 1.5* 1.3*  APTT  --   --  43*  --     BMP: Recent Labs    01/25/20 0522 01/25/20 0522 01/26/20 0437 01/27/20 1100 01/31/20 1339 02/08/20 1132  NA 133*   < > 134* 133* 135 138  K 4.2   < > 4.6 4.8 5.3 No hemolysis seen* 4.9  CL 108   < > 107 103 105 106  CO2 20*   < > 20* 22 25 23   GLUCOSE 83   < > 165* 191* 256* 123*  BUN 25*   < >  20 24* 38* 32*  CALCIUM 8.1*   < > 8.4* 8.7* 8.6 8.9  CREATININE 1.03   < > 0.98 0.94 0.98 1.08  GFRNONAA >60  --  >60 >60  --  >60  GFRAA >60  --  >60 >60  --  >60   < > = values in this interval not displayed.    LIVER FUNCTION TESTS: Recent Labs    10/02/19 1503 01/23/20 2008 01/27/20 1100 02/08/20 1132  BILITOT 1.3* 0.6 1.0 0.7  AST 30 25 28 27   ALT 23 23 27 22   ALKPHOS 95 55 108 93  PROT 7.3 7.0 7.2 7.4  ALBUMIN 3.8 3.7 3.7 3.6    TUMOR MARKERS: No results for input(s): AFPTM, CEA, CA199, CHROMGRNA in the last 8760 hours.  Assessment and Plan:  Mr. Parkerson continues to struggle with recurrent large-volume ascites necessitating large-volume paracentesis approximately every other week.  His TIPS work-up is now complete and he has no significant contraindication.  Echocardiography demonstrates a normal ejection fraction and normally functioning right heart.  His most recent laboratory evaluation confirms an acceptable Na-MELD score of 10.  He is an excellent candidate for elective TIPS creation.  I reviewed the procedure again with him.  He understands and desires to proceed.    1.)   Please schedule for elective TIPS to be performed under general anesthesia at [facility].  We will also perform paracentesis at the time of TIPS as well as a potential embolization of any significant gastroesophageal varices given his history of prior variceal bleeding.  Electronically Signed: Jacqulynn Cadet 02/27/2020, 11:33 AM   I spent a total of 15 Minutes in remote  clinical consultation, greater than 50% of which was counseling/coordinating care for cirrhosis complicated by ascites.    Visit type: Audio only (telephone). Audio (no video) only due to patient request. . Alternative for in-person consultation at [facility], [address], [place], [place]. This visit type was conducted due to national recommendations for restrictions regarding the COVID-19 Pandemic (e.g. social distancing).  This format is felt to be most appropriate for this patient at this time.  All issues noted in this document were discussed and addressed.

## 2020-02-28 ENCOUNTER — Other Ambulatory Visit: Payer: Self-pay

## 2020-02-28 ENCOUNTER — Ambulatory Visit (HOSPITAL_COMMUNITY)
Admission: RE | Admit: 2020-02-28 | Discharge: 2020-02-28 | Disposition: A | Discharge status: Home / Self Care | Payer: BC Managed Care – PPO | Source: Ambulatory Visit | Attending: Gastroenterology | Admitting: Gastroenterology

## 2020-02-28 DIAGNOSIS — K7031 Alcoholic cirrhosis of liver with ascites: Secondary | ICD-10-CM | POA: Diagnosis not present

## 2020-02-28 DIAGNOSIS — K7460 Unspecified cirrhosis of liver: Secondary | ICD-10-CM

## 2020-02-28 HISTORY — PX: IR PARACENTESIS: IMG2679

## 2020-02-28 LAB — BODY FLUID CELL COUNT WITH DIFFERENTIAL
Eos, Fluid: 1 %
Lymphs, Fluid: 53 %
Monocyte-Macrophage-Serous Fluid: 40 % — ABNORMAL LOW (ref 50–90)
Neutrophil Count, Fluid: 6 % (ref 0–25)
Total Nucleated Cell Count, Fluid: 73 cu mm (ref 0–1000)

## 2020-02-28 LAB — GRAM STAIN

## 2020-02-28 IMAGING — US IR PARACENTESIS
1 series · 2 of 2 positions shown · non-contrast
Comparison: none

INDICATION: Patient with history of cirrhosis, recurrent ascites. Request is
made for diagnostic and therapeutic paracentesis.

[Series 1: ir (id) (id)/(id)/(id) ir · 2 of 2 slices shown]
[im 1/2]
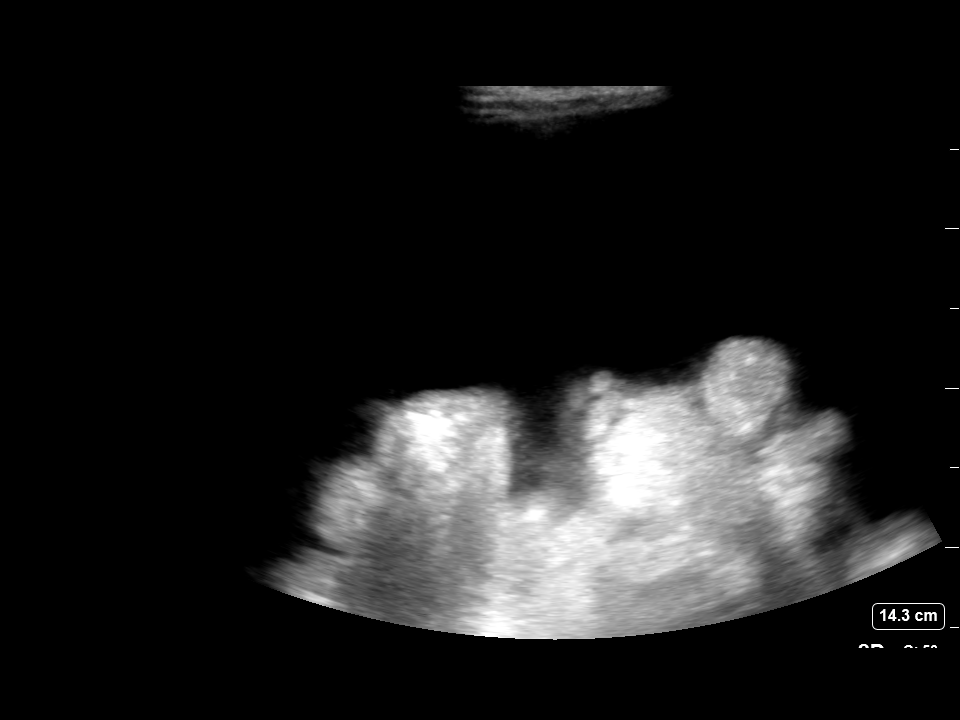
[im 2/2]
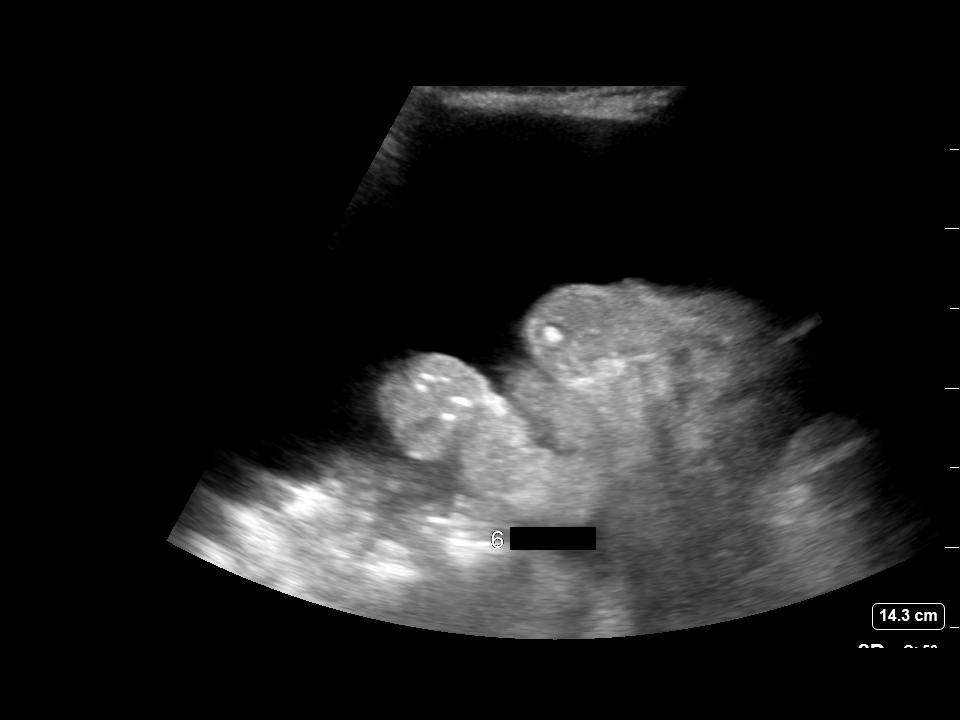

[2 of 2 positions shown; findings below may reference images not displayed]

EXAM:
ULTRASOUND GUIDED DIAGNOSTIC AND THERAPEUTIC PARACENTESIS

MEDICATIONS:
10 mL 1% lidocaine

COMPLICATIONS:
None immediate.

PROCEDURE:
Informed written consent was obtained from the patient after a
discussion of the risks, benefits and alternatives to treatment. A
timeout was performed prior to the initiation of the procedure.

Initial ultrasound scanning demonstrates a large amount of ascites
within the right lower abdominal quadrant. The right lower abdomen
was prepped and draped in the usual sterile fashion. 1% lidocaine
was used for local anesthesia.

Following this, a 19 gauge, 7-cm, Yueh catheter was introduced. An
ultrasound image was saved for documentation purposes. The
paracentesis was performed. The catheter was removed and a dressing
was applied. The patient tolerated the procedure well without
immediate post procedural complication.
Patient received post-procedure intravenous albumin; see nursing
notes for details.
FINDINGS: A total of approximately 6.0 liters of yellow fluid was removed.
Samples were sent to the laboratory as requested by the clinical
team.
IMPRESSION: Successful ultrasound-guided diagnostic and therapeutic paracentesis
yielding 6.0 liters of peritoneal fluid.

## 2020-02-28 MED ORDER — ALBUMIN HUMAN 25 % IV SOLN
50.0000 g | Freq: Once | INTRAVENOUS | Status: AC
  Administered 2020-02-28: 50 g via INTRAVENOUS
  Filled 2020-02-28: qty 200

## 2020-02-28 MED ORDER — ALBUMIN HUMAN 25 % IV SOLN
INTRAVENOUS | Status: AC
  Filled 2020-02-28: qty 200

## 2020-02-28 MED ORDER — LIDOCAINE HCL 1 % IJ SOLN
INTRAMUSCULAR | Status: AC
  Filled 2020-02-28: qty 20

## 2020-02-28 NOTE — Procedures (Signed)
PROCEDURE SUMMARY:  Successful US guided paracentesis from right lateral abdomen.  Yielded 6.0 liters of yellow fluid.  No immediate complications.  Pt tolerated well.   Specimen was sent for labs.  EBL < 19m  KDocia BarrierPA-C 02/28/2020 10:04 AM

## 2020-02-29 LAB — PATHOLOGIST SMEAR REVIEW

## 2020-03-04 LAB — CULTURE, BODY FLUID W GRAM STAIN -BOTTLE: Culture: NO GROWTH

## 2020-03-05 ENCOUNTER — Encounter: Payer: Self-pay | Admitting: Gastroenterology

## 2020-03-05 ENCOUNTER — Other Ambulatory Visit (HOSPITAL_COMMUNITY): Payer: Self-pay | Admitting: Interventional Radiology

## 2020-03-05 ENCOUNTER — Encounter: Payer: Self-pay | Admitting: Nephrology

## 2020-03-05 DIAGNOSIS — K7031 Alcoholic cirrhosis of liver with ascites: Secondary | ICD-10-CM

## 2020-03-06 NOTE — Progress Notes (Signed)
[place], [place] [address] Phone: [phone] Fax: [phone]    Your procedure is scheduled on Tuesday, March 30th.  Report to [facility] Main Entrance "A" at 7:00 A.M., and check in at the Admitting office.  Call this number if you have problems the morning of surgery:  [phone]  Call [phone] if you have any questions prior to your surgery date Monday-Friday 8am-4pm   Remember:  Do not eat or drink after midnight the night before your surgery   Take these medicines the morning of surgery with A SIP OF WATER  propranolol (INDERAL)   As of today, stop taking other Aspirin containing products, Vitamins, Fish Oils, and Herbal Medications. Also stop all NSAIDS i.e. Advil, Ibuprofen, Motrin, Aleve, Anaprox, Naproxen, BC, Goody Powders, and all Supplements.    WHAT DO I DO ABOUT MY DIABETES MEDICATION?  THE NIGHT BEFORE SURGERY, do NOT take dapagliflozin propanediol (FARXIGA).  . Do NOT take glipiZIDE (GLUCOTROL) or metFORMIN (GLUCOPHAGE)  oral diabetes medicines (pills) the morning of surgery.  HOW TO MANAGE YOUR DIABETES BEFORE AND AFTER SURGERY  Why is it important to control my blood sugar before and after surgery? . Improving blood sugar levels before and after surgery helps healing and can limit problems. . A way of improving blood sugar control is eating a healthy diet by: o  Eating less sugar and carbohydrates o  Increasing activity/exercise o  Talking with your doctor about reaching your blood sugar goals . High blood sugars (greater than 180 mg/dL) can raise your risk of infections and slow your recovery, so you will need to focus on controlling your diabetes during the weeks before surgery. . Make sure that the doctor who takes care of your diabetes knows about your planned surgery including the date and location.  How do I manage my blood sugar before surgery? . Check your blood sugar at  least 4 times a day, starting 2 days before surgery, to make sure that the level is not too high or low. . Check your blood sugar the morning of your surgery when you wake up and every 2 hours until you get to the Short Stay unit. o If your blood sugar is less than 70 mg/dL, you will need to treat for low blood sugar: - Do not take insulin. - Treat a low blood sugar (less than 70 mg/dL) with  cup of clear juice (cranberry or apple), 4 glucose tablets, OR glucose gel. - Recheck blood sugar in 15 minutes after treatment (to make sure it is greater than 70 mg/dL). If your blood sugar is not greater than 70 mg/dL on recheck, call [phone] for further instructions. . Report your blood sugar to the short stay nurse when you get to Short Stay.  . If you are admitted to the hospital after surgery: o Your blood sugar will be checked by the staff and you will probably be given insulin after surgery (instead of oral diabetes medicines) to make sure you have good blood sugar levels. o The goal for blood sugar control after surgery is 80-180 mg/dL.  No Smoking of any kind, Tobacco, or Alcohol products 24 hours prior to your procedure. If you use a CPAP at night, you may bring all equipment for your overnight stay.                        Do not wear jewelry.  Do not wear lotions, powders,colognes, or deodorant.            Men may shave face and neck.            Do not bring valuables to the hospital.            [facility] is not responsible for any belongings or valuables.   Contacts, glasses, dentures or bridgework may not be worn into surgery.      For patients admitted to the hospital, discharge time will be determined by your treatment team.   Patients discharged the day of surgery will not be allowed to drive home, and someone needs to stay with them for 24 hours.  Special instructions:   [place] [facility]- Preparing For Surgery  Before surgery, you can play an important role. Because  skin is not sterile, your skin needs to be as free of germs as possible. You can reduce the number of germs on your skin by washing with CHG (chlorahexidine gluconate) Soap before surgery.  CHG is an antiseptic cleaner which kills germs and bonds with the skin to continue killing germs even after washing.    Oral Hygiene is also important to reduce your risk of infection.  Remember - BRUSH YOUR TEETH THE MORNING OF SURGERY WITH YOUR REGULAR TOOTHPASTE  Please do not use if you have an allergy to CHG or antibacterial soaps. If your skin becomes reddened/irritated stop using the CHG.  Do not shave (including legs and underarms) for at least 48 hours prior to first CHG shower. It is OK to shave your face.  Please follow these instructions carefully.   1. Shower the NIGHT BEFORE SURGERY and the MORNING OF SURGERY with CHG Soap.   2. If you chose to wash your hair, wash your hair first as usual with your normal shampoo.  3. After you shampoo, rinse your hair and body thoroughly to remove the shampoo.  4. Use CHG as you would any other liquid soap. You can apply CHG directly to the skin and wash gently with a scrungie or a clean washcloth.   5. Apply the CHG Soap to your body ONLY FROM THE NECK DOWN.  Do not use on open wounds or open sores. Avoid contact with your eyes, ears, mouth and genitals (private parts). Wash Face and genitals (private parts)  with your normal soap.   6. Wash thoroughly, paying special attention to the area where your surgery will be performed.  7. Thoroughly rinse your body with warm water from the neck down.  8. DO NOT shower/wash with your normal soap after using and rinsing off the CHG Soap.  9. Pat yourself dry with a CLEAN TOWEL.  10. Wear CLEAN PAJAMAS to bed the night before surgery, wear comfortable clothes the morning of surgery  11. Place CLEAN SHEETS on your bed the night of your first shower and DO NOT SLEEP WITH PETS. Day of Surgery: Do not apply any  deodorants/lotions.  Please wear clean clothes to the hospital/surgery center.   Remember to brush your teeth WITH YOUR REGULAR TOOTHPASTE.   Please read over the following fact sheets that you were given.

## 2020-03-07 ENCOUNTER — Encounter (HOSPITAL_COMMUNITY)
Admission: RE | Admit: 2020-03-07 | Discharge: 2020-03-07 | Disposition: A | Discharge status: Home / Self Care | Payer: BC Managed Care – PPO | Source: Ambulatory Visit | Attending: Interventional Radiology | Admitting: Interventional Radiology

## 2020-03-07 ENCOUNTER — Other Ambulatory Visit: Payer: Self-pay

## 2020-03-07 ENCOUNTER — Encounter (HOSPITAL_COMMUNITY): Payer: Self-pay

## 2020-03-07 DIAGNOSIS — K7031 Alcoholic cirrhosis of liver with ascites: Secondary | ICD-10-CM | POA: Diagnosis not present

## 2020-03-07 DIAGNOSIS — D649 Anemia, unspecified: Secondary | ICD-10-CM | POA: Insufficient documentation

## 2020-03-07 DIAGNOSIS — M1990 Unspecified osteoarthritis, unspecified site: Secondary | ICD-10-CM | POA: Diagnosis not present

## 2020-03-07 DIAGNOSIS — I10 Essential (primary) hypertension: Secondary | ICD-10-CM | POA: Diagnosis not present

## 2020-03-07 DIAGNOSIS — E785 Hyperlipidemia, unspecified: Secondary | ICD-10-CM | POA: Diagnosis not present

## 2020-03-07 DIAGNOSIS — Z79899 Other long term (current) drug therapy: Secondary | ICD-10-CM | POA: Diagnosis not present

## 2020-03-07 DIAGNOSIS — E119 Type 2 diabetes mellitus without complications: Secondary | ICD-10-CM | POA: Insufficient documentation

## 2020-03-07 DIAGNOSIS — Z7984 Long term (current) use of oral hypoglycemic drugs: Secondary | ICD-10-CM | POA: Diagnosis not present

## 2020-03-07 DIAGNOSIS — Z01818 Encounter for other preprocedural examination: Secondary | ICD-10-CM | POA: Insufficient documentation

## 2020-03-07 DIAGNOSIS — K7581 Nonalcoholic steatohepatitis (NASH): Secondary | ICD-10-CM | POA: Diagnosis not present

## 2020-03-07 HISTORY — DX: Dyspnea, unspecified: R06.00

## 2020-03-07 LAB — COMPREHENSIVE METABOLIC PANEL
ALT: 27 U/L (ref 0–44)
AST: 41 U/L (ref 15–41)
Albumin: 3.2 g/dL — ABNORMAL LOW (ref 3.5–5.0)
Alkaline Phosphatase: 70 U/L (ref 38–126)
Anion gap: 14 (ref 5–15)
BUN: 23 mg/dL — ABNORMAL HIGH (ref 6–20)
CO2: 22 mmol/L (ref 22–32)
Calcium: 8.7 mg/dL — ABNORMAL LOW (ref 8.9–10.3)
Chloride: 100 mmol/L (ref 98–111)
Creatinine, Ser: 1.04 mg/dL (ref 0.61–1.24)
GFR calc Af Amer: >60 mL/min (ref >60)
GFR calc non Af Amer: >60 mL/min (ref >60)
Glucose, Bld: 92 mg/dL (ref 70–99)
Potassium: 3.6 mmol/L (ref 3.5–5.1)
Sodium: 136 mmol/L (ref 135–145)
Total Bilirubin: 1.4 mg/dL — ABNORMAL HIGH (ref 0.3–1.2)
Total Protein: 8.1 g/dL (ref 6.5–8.1)

## 2020-03-07 LAB — CBC
HCT: 29.1 % — ABNORMAL LOW (ref 39.0–52.0)
Hemoglobin: 8.7 g/dL — ABNORMAL LOW (ref 13.0–17.0)
MCH: 29.4 pg (ref 26.0–34.0)
MCHC: 29.9 g/dL — ABNORMAL LOW (ref 30.0–36.0)
MCV: 98.3 fL (ref 80.0–100.0)
Platelets: 155 10*3/uL (ref 150–400)
RBC: 2.96 MIL/uL — ABNORMAL LOW (ref 4.22–5.81)
RDW: 14.9 % (ref 11.5–15.5)
WBC: 7.2 10*3/uL (ref 4.0–10.5)
nRBC: 0 % (ref 0.0–0.2)

## 2020-03-07 LAB — GLUCOSE, CAPILLARY: Glucose-Capillary: 99 mg/dL (ref 70–99)

## 2020-03-07 NOTE — Progress Notes (Addendum)
PCP - R [place]  In [place] Cardiologist - na    -  Chest x-ray - 8/20 EKG - 2/21 Stress Test - na ECHO - 2/21 Cardiac Cath - na   Fasting Blood Sugar - 99 Checks Blood Sugar ___2__ times a day  Blood Thinner Instructions:na Aspirin Instructions:na  COVID TEST- for sat   Anesthesia review: review lab  Patient denies shortness of breath, fever, cough and chest pain at PAT appointment   All instructions explained to the patient, with a verbal understanding of the material. Patient agrees to go over the instructions while at home for a better understanding. Patient also instructed to self quarantine after being tested for COVID-19. The opportunity to ask questions was provided.

## 2020-03-08 NOTE — Progress Notes (Signed)
Anesthesia Chart Review:   Case: [id number] Date/Time: 03/12/20 0845   Procedure: TIPS (N/A )   Anesthesia type: General   Pre-op diagnosis: Ascites due to alcoholic cirrhosis Q25.95   Location: [facility] OR ROOM 18 / [place] [facility]   Surgeons: Jacqulynn Cadet, MD      DISCUSSION:  Pt is 54 years old with hx HTN, DM, nonalcoholic steatohepatitis, cirrhosis (has prior bleeding esophageal varices tx with endoscopic banding 2019; ascites is requiring large volume paracentesis approximately every other week for last 5 months), anemia.   - Hospitalized 2/9-2/13/21 for spontaneous bacterial peritonitis (SBP) and acute kidney injury in the setting of decompensated cirrhosis with refractory ascites requiring frequent large-volume paracentesis. Pt now taking ciprofloxacin weekly as prophylaxis for SBP. Hospitalization complicated by acute anemia of blood loss vs symptomatic anemia in the setting of cirrhosis due to NASH (hgb dropped to 6.1 from 11); s/p 3 units PRBCs and iron infusion; GI suggested no endoscopic procedures.    - Hgb 8.7.  It appears pt's baseline hgb is ~11.  During hospitalization for SBP last month, hgb dropped to 6.1; pt received iron infusion and 3 units PRBCs. At time of discharge, hgb was 8.6  - Reviewed hgb with Dr. Tobias Alexander  VS: BP 122/79   Pulse 81   Temp 36.8 C (Oral)   Resp 18   Ht 6' 1"  (1.854 m)   Wt 106.6 kg   SpO2 100%   BMI 31.01 kg/m    PROVIDERS: - PCP is Heide Scales F, NP   LABS:  - Hgb 8.7.  It appears pt's baseline hgb is ~11.  During hospitalization for SBP last month, hgb dropped to 6.1; pt received iron infusion and 3 units PRBCs. At time of discharge, hgb was 8.6  (all labs ordered are listed, but only abnormal results are displayed)  Labs Reviewed  CBC - Abnormal; Notable for the following components:      Result Value   RBC 2.96 (*)    Hemoglobin 8.7 (*)    HCT 29.1 (*)    MCHC 29.9 (*)    All other components within normal limits  COMPREHENSIVE  METABOLIC PANEL - Abnormal; Notable for the following components:   BUN 23 (*)    Calcium 8.7 (*)    Albumin 3.2 (*)    Total Bilirubin 1.4 (*)    All other components within normal limits  GLUCOSE, CAPILLARY     IMAGES:  CXR 01/23/20: No active cardiopulmonary disease   EKG 01/23/20: sinus rhythm. Low voltage, precordial leads    CV:  Echo 01/19/20:  1. LV EF, by visual estimation, is 60 to 65%. LV has normal function. There is no LVH.  2. LV diastolic parameters are consistent with Grade I diastolic dysfunction (impaired relaxation).  3. Global RV has normal systolic function.The RV size is normal.  4. Left atrial size was normal.  5. Right atrial size was normal.  6. The mitral valve is normal in structure. Trivial mitral valve regurgitation. No evidence of mitral stenosis.  7. The tricuspid valve is normal in structure. Tricuspid valve regurgitation is trivial.  8. The aortic valve is tricuspid. Aortic valve regurgitation is not visualized. Mild aortic valve sclerosis without stenosis.  9. The pulmonic valve was normal in structure. Pulmonic valve regurgitation is trivial.  10. The inferior vena cava is normal in size with greater than 50% respiratory variability, suggesting right atrial pressure of 3 mmHg.  11. Normal LV systolic function; grade 1 diastolic dysfunction.  12. The left ventricle has no regional wall motion abnormalities.    Past Medical History:  Diagnosis Date  . Anemia   . Arthritis   . Cirrhosis ([place])   . Diabetes mellitus without complication ([place])   . Dyspnea   . Fatty liver   . GERD (gastroesophageal reflux disease)   . GI bleed   . Hyperlipidemia   . Hypertension     Past Surgical History:  Procedure Laterality Date  . BIOPSY  02/14/2019   Procedure: BIOPSY;  Surgeon: Lavena Bullion, DO;  Location: [facility];  Service: Gastroenterology;;  . BIOPSY  07/22/2019   Procedure: BIOPSY;  Surgeon: Lavena Bullion, DO;  Location: [facility];   Service: Gastroenterology;;  . ESOPHAGEAL BANDING  10/07/2018   Procedure: ESOPHAGEAL BANDING;  Surgeon: Lavena Bullion, DO;  Location: [place];  Service: Gastroenterology;;  . ESOPHAGEAL BANDING N/A 01/03/2019   Procedure: ESOPHAGEAL BANDING;  Surgeon: Lavena Bullion, DO;  Location: [facility];  Service: Gastroenterology;  Laterality: N/A;  . ESOPHAGEAL BANDING  02/14/2019   Procedure: ESOPHAGEAL BANDING;  Surgeon: Lavena Bullion, DO;  Location: [facility];  Service: Gastroenterology;;  . esophageal bands    . ESOPHAGOGASTRODUODENOSCOPY Left 07/22/2019   Procedure: ESOPHAGOGASTRODUODENOSCOPY (EGD) WITH POSSIBLE BANDING;  Surgeon: Lavena Bullion, DO;  Location: [facility];  Service: Gastroenterology;  Laterality: Left;  . ESOPHAGOGASTRODUODENOSCOPY (EGD) WITH PROPOFOL N/A 10/07/2018   Procedure: ESOPHAGOGASTRODUODENOSCOPY (EGD) WITH PROPOFOL;  Surgeon: Lavena Bullion, DO;  Location: [place];  Service: Gastroenterology;  Laterality: N/A;  . ESOPHAGOGASTRODUODENOSCOPY (EGD) WITH PROPOFOL N/A 01/03/2019   Procedure: ESOPHAGOGASTRODUODENOSCOPY (EGD) WITH PROPOFOL;  Surgeon: Lavena Bullion, DO;  Location: [facility];  Service: Gastroenterology;  Laterality: N/A;  . ESOPHAGOGASTRODUODENOSCOPY (EGD) WITH PROPOFOL N/A 02/14/2019   Procedure: ESOPHAGOGASTRODUODENOSCOPY (EGD) WITH PROPOFOL;  Surgeon: Lavena Bullion, DO;  Location: [facility];  Service: Gastroenterology;  Laterality: N/A;  . IR PARACENTESIS  10/05/2019  . IR PARACENTESIS  10/24/2019  . IR PARACENTESIS  11/16/2019  . IR PARACENTESIS  12/25/2019  . IR PARACENTESIS  01/05/2020  . IR PARACENTESIS  01/23/2020  . IR PARACENTESIS  02/08/2020  . IR PARACENTESIS  02/16/2020  . IR PARACENTESIS  02/28/2020  . IR RADIOLOGIST EVAL & MGMT  12/19/2019  . IR RADIOLOGIST EVAL & MGMT  02/27/2020    MEDICATIONS: . cholecalciferol (VITAMIN D) 1000 units tablet  . ciprofloxacin (CIPRO) 500 MG tablet  . dapagliflozin  propanediol (FARXIGA) 10 MG TABS tablet  . feeding supplement, ENSURE ENLIVE, (ENSURE ENLIVE) LIQD  . ferrous sulfate (KP FERROUS SULFATE) 325 (65 FE) MG tablet  . furosemide (LASIX) 20 MG tablet  . furosemide (LASIX) 40 MG tablet  . glipiZIDE (GLUCOTROL) 10 MG tablet  . metFORMIN (GLUCOPHAGE) 1000 MG tablet  . Multiple Vitamin (MULTIVITAMIN WITH MINERALS) TABS tablet  . propranolol (INDERAL) 40 MG tablet   No current facility-administered medications for this encounter.    If no changes, I anticipate pt can proceed with surgery as scheduled.   Willeen Cass, FNP-BC [facility]/Anesthesiology Phone: [phone] 03/08/2020 2:59 PM

## 2020-03-09 ENCOUNTER — Other Ambulatory Visit (HOSPITAL_COMMUNITY)
Admission: RE | Admit: 2020-03-09 | Discharge: 2020-03-09 | Disposition: A | Discharge status: Home / Self Care | Payer: BC Managed Care – PPO | Source: Ambulatory Visit | Attending: Interventional Radiology | Admitting: Interventional Radiology

## 2020-03-09 DIAGNOSIS — Z20822 Contact with and (suspected) exposure to covid-19: Secondary | ICD-10-CM | POA: Insufficient documentation

## 2020-03-09 DIAGNOSIS — Z01812 Encounter for preprocedural laboratory examination: Secondary | ICD-10-CM | POA: Diagnosis not present

## 2020-03-09 LAB — SARS CORONAVIRUS 2 (TAT 6-24 HRS): SARS Coronavirus 2: NEGATIVE

## 2020-03-11 ENCOUNTER — Telehealth: Payer: Self-pay | Admitting: Gastroenterology

## 2020-03-11 ENCOUNTER — Other Ambulatory Visit: Payer: Self-pay | Admitting: Radiology

## 2020-03-11 ENCOUNTER — Other Ambulatory Visit: Payer: Self-pay | Admitting: Student

## 2020-03-11 NOTE — Telephone Encounter (Signed)
Spoke to Vern from [employer] to let her know that Dr Bryan Lemma signed the disability paper work last week and the papers were sent to the main office to go to [employer].

## 2020-03-11 NOTE — Anesthesia Preprocedure Evaluation (Addendum)
Anesthesia Evaluation  Patient identified by MRN, date of birth, ID band Patient awake    Reviewed: Allergy & Precautions, NPO status , Patient's Chart, lab work & pertinent test results  Airway Mallampati: II  TM Distance: >3 FB     Dental  (+) Dental Advisory Given   Pulmonary shortness of breath,    breath sounds clear to auscultation       Cardiovascular hypertension,  Rhythm:Regular Rate:Normal     Neuro/Psych negative neurological ROS     GI/Hepatic GERD  ,(+) Cirrhosis   Esophageal Varices and ascites    ,   Endo/Other  diabetes, Type 2, Oral Hypoglycemic Agents  Renal/GU      Musculoskeletal  (+) Arthritis ,   Abdominal   Peds  Hematology  (+) anemia ,   Anesthesia Other Findings   Reproductive/Obstetrics                            Lab Results  Component Value Date   WBC 7.2 03/07/2020   HGB 8.7 (L) 03/07/2020   HCT 29.1 (L) 03/07/2020   MCV 98.3 03/07/2020   PLT 155 03/07/2020   Lab Results  Component Value Date   CREATININE 1.04 03/07/2020   BUN 23 (H) 03/07/2020   NA 136 03/07/2020   K 3.6 03/07/2020   CL 100 03/07/2020   CO2 22 03/07/2020    Anesthesia Physical Anesthesia Plan  ASA: III  Anesthesia Plan: General   Post-op Pain Management:    Induction: Intravenous  PONV Risk Score and Plan: 2 and Dexamethasone, Ondansetron and Treatment may vary due to age or medical condition  Airway Management Planned: Oral ETT  Additional Equipment:   Intra-op Plan:   Post-operative Plan: Extubation in OR and Possible Post-op intubation/ventilation  Informed Consent: I have reviewed the patients History and Physical, chart, labs and discussed the procedure including the risks, benefits and alternatives for the proposed anesthesia with the patient or authorized representative who has indicated his/her understanding and acceptance.     Dental advisory  given  Plan Discussed with: CRNA  Anesthesia Plan Comments:        Anesthesia Quick Evaluation

## 2020-03-12 ENCOUNTER — Inpatient Hospital Stay (HOSPITAL_COMMUNITY): Payer: BC Managed Care – PPO | Admitting: Anesthesiology

## 2020-03-12 ENCOUNTER — Other Ambulatory Visit: Payer: Self-pay

## 2020-03-12 ENCOUNTER — Encounter (HOSPITAL_COMMUNITY)
Admission: AD | Disposition: A | Discharge status: Home / Self Care | Payer: Self-pay | Source: Home / Self Care | Attending: Interventional Radiology

## 2020-03-12 ENCOUNTER — Encounter (HOSPITAL_COMMUNITY): Payer: Self-pay

## 2020-03-12 ENCOUNTER — Observation Stay (HOSPITAL_COMMUNITY)
Admission: AD | Admit: 2020-03-12 | Discharge: 2020-03-13 | Disposition: A | Discharge status: Home / Self Care | Payer: BC Managed Care – PPO | Attending: Interventional Radiology | Admitting: Interventional Radiology

## 2020-03-12 ENCOUNTER — Observation Stay (HOSPITAL_COMMUNITY)
Admission: RE | Admit: 2020-03-12 | Discharge: 2020-03-12 | Disposition: A | Discharge status: Home / Self Care | Payer: BC Managed Care – PPO | Source: Ambulatory Visit | Attending: Interventional Radiology | Admitting: Interventional Radiology

## 2020-03-12 ENCOUNTER — Inpatient Hospital Stay (HOSPITAL_COMMUNITY): Payer: BC Managed Care – PPO | Admitting: Emergency Medicine

## 2020-03-12 ENCOUNTER — Encounter (HOSPITAL_COMMUNITY): Payer: Self-pay | Admitting: Interventional Radiology

## 2020-03-12 DIAGNOSIS — K219 Gastro-esophageal reflux disease without esophagitis: Secondary | ICD-10-CM | POA: Insufficient documentation

## 2020-03-12 DIAGNOSIS — Z7984 Long term (current) use of oral hypoglycemic drugs: Secondary | ICD-10-CM | POA: Insufficient documentation

## 2020-03-12 DIAGNOSIS — Z79899 Other long term (current) drug therapy: Secondary | ICD-10-CM | POA: Insufficient documentation

## 2020-03-12 DIAGNOSIS — K7031 Alcoholic cirrhosis of liver with ascites: Secondary | ICD-10-CM

## 2020-03-12 DIAGNOSIS — E119 Type 2 diabetes mellitus without complications: Secondary | ICD-10-CM | POA: Diagnosis not present

## 2020-03-12 DIAGNOSIS — D649 Anemia, unspecified: Secondary | ICD-10-CM | POA: Diagnosis not present

## 2020-03-12 DIAGNOSIS — K7460 Unspecified cirrhosis of liver: Principal | ICD-10-CM | POA: Diagnosis present

## 2020-03-12 DIAGNOSIS — E785 Hyperlipidemia, unspecified: Secondary | ICD-10-CM | POA: Insufficient documentation

## 2020-03-12 DIAGNOSIS — R188 Other ascites: Secondary | ICD-10-CM | POA: Diagnosis not present

## 2020-03-12 DIAGNOSIS — I10 Essential (primary) hypertension: Secondary | ICD-10-CM | POA: Diagnosis not present

## 2020-03-12 DIAGNOSIS — K7581 Nonalcoholic steatohepatitis (NASH): Secondary | ICD-10-CM | POA: Insufficient documentation

## 2020-03-12 DIAGNOSIS — K7030 Alcoholic cirrhosis of liver without ascites: Secondary | ICD-10-CM

## 2020-03-12 HISTORY — PX: IR PARACENTESIS: IMG2679

## 2020-03-12 HISTORY — PX: IR TIPS: IMG2295

## 2020-03-12 HISTORY — PX: RADIOLOGY WITH ANESTHESIA: SHX6223

## 2020-03-12 LAB — CBC WITH DIFFERENTIAL/PLATELET
Abs Immature Granulocytes: 0.02 10*3/uL (ref 0.00–0.07)
Basophils Absolute: 0.1 10*3/uL (ref 0.0–0.1)
Basophils Relative: 1 %
Eosinophils Absolute: 0.2 10*3/uL (ref 0.0–0.5)
Eosinophils Relative: 3 %
HCT: 27.4 % — ABNORMAL LOW (ref 39.0–52.0)
Hemoglobin: 8.4 g/dL — ABNORMAL LOW (ref 13.0–17.0)
Immature Granulocytes: 0 %
Lymphocytes Relative: 10 %
Lymphs Abs: 0.6 10*3/uL — ABNORMAL LOW (ref 0.7–4.0)
MCH: 28.4 pg (ref 26.0–34.0)
MCHC: 30.7 g/dL (ref 30.0–36.0)
MCV: 92.6 fL (ref 80.0–100.0)
Monocytes Absolute: 0.7 10*3/uL (ref 0.1–1.0)
Monocytes Relative: 12 %
Neutro Abs: 4.2 10*3/uL (ref 1.7–7.7)
Neutrophils Relative %: 74 %
Platelets: 166 10*3/uL (ref 150–400)
RBC: 2.96 MIL/uL — ABNORMAL LOW (ref 4.22–5.81)
RDW: 15 % (ref 11.5–15.5)
WBC: 5.7 10*3/uL (ref 4.0–10.5)
nRBC: 0 % (ref 0.0–0.2)

## 2020-03-12 LAB — COMPREHENSIVE METABOLIC PANEL
ALT: 29 U/L (ref 0–44)
AST: 36 U/L (ref 15–41)
Albumin: 3.2 g/dL — ABNORMAL LOW (ref 3.5–5.0)
Alkaline Phosphatase: 69 U/L (ref 38–126)
Anion gap: 13 (ref 5–15)
BUN: 18 mg/dL (ref 6–20)
CO2: 27 mmol/L (ref 22–32)
Calcium: 8.9 mg/dL (ref 8.9–10.3)
Chloride: 98 mmol/L (ref 98–111)
Creatinine, Ser: 1.04 mg/dL (ref 0.61–1.24)
GFR calc Af Amer: >60 mL/min (ref >60)
GFR calc non Af Amer: >60 mL/min (ref >60)
Glucose, Bld: 120 mg/dL — ABNORMAL HIGH (ref 70–99)
Potassium: 3.4 mmol/L — ABNORMAL LOW (ref 3.5–5.1)
Sodium: 138 mmol/L (ref 135–145)
Total Bilirubin: 1.2 mg/dL (ref 0.3–1.2)
Total Protein: 7.7 g/dL (ref 6.5–8.1)

## 2020-03-12 LAB — TYPE AND SCREEN
ABO/RH(D): A POS
Antibody Screen: NEGATIVE

## 2020-03-12 LAB — GLUCOSE, CAPILLARY
Glucose-Capillary: 106 mg/dL — ABNORMAL HIGH (ref 70–99)
Glucose-Capillary: 98 mg/dL (ref 70–99)
Glucose-Capillary: 125 mg/dL — ABNORMAL HIGH (ref 70–99)

## 2020-03-12 LAB — PROTIME-INR
INR: 1.4 — ABNORMAL HIGH (ref 0.8–1.2)
Prothrombin Time: 16.7 seconds — ABNORMAL HIGH (ref 11.4–15.2)

## 2020-03-12 IMAGING — XA IR TIPS
12 of 18 series · 13 of 24 positions shown · IV contrast (IODINE)
Comparison: none

CLINICAL DATA: 53-year-old male with non alcoholic steatohepatitis
cirrhosis complicated by recurrent large volume ascites. He has been
diuretic insensitive and has also suffered a recent episode of acute
kidney injury. Therefore, he presents for placement of an elective
tips.

[Series 1: ir tips · 1 of 8 slices shown]
[im 1/8]
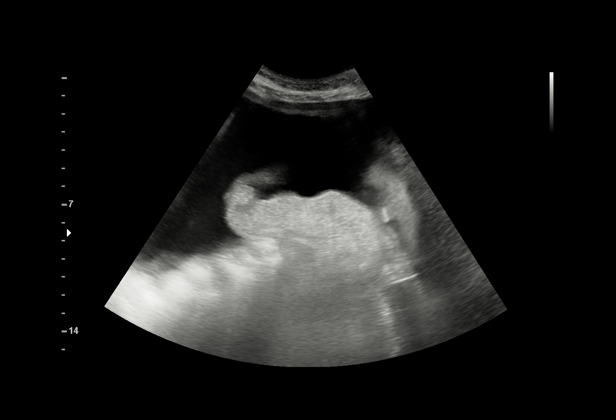

[Series 2: body 4 care · 1 of 4 slices shown (1 of 8)]
[im 1/4]
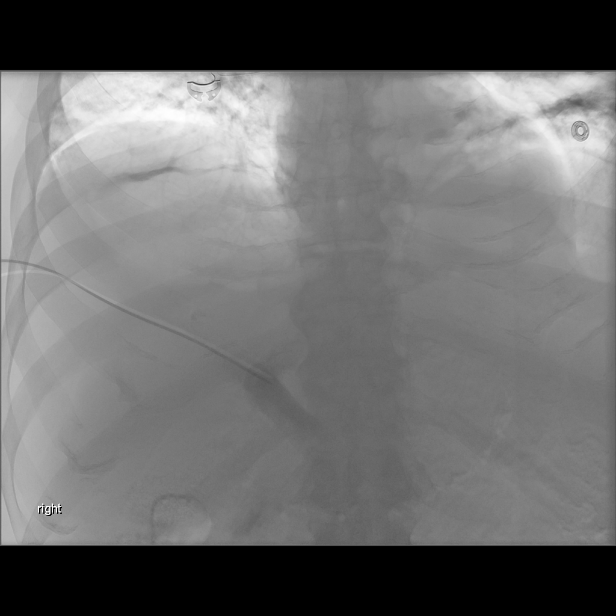

[Series 3: body 4 care · 1 of 6 slices shown (2 of 8)]
[im 6/6]
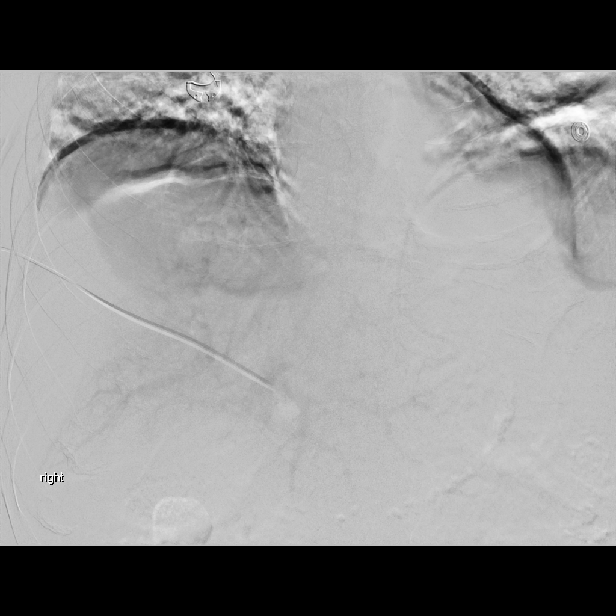

[Series 5: body 4 care · 1 of 4 slices shown (3 of 8)]
[im 1/4]
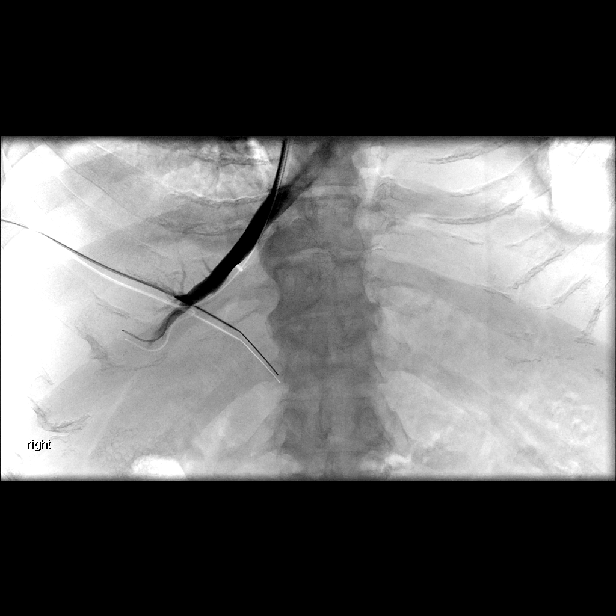

[Series 7: body 4 care · 1 of 5 slices shown (4 of 8)]
[im 1/5]
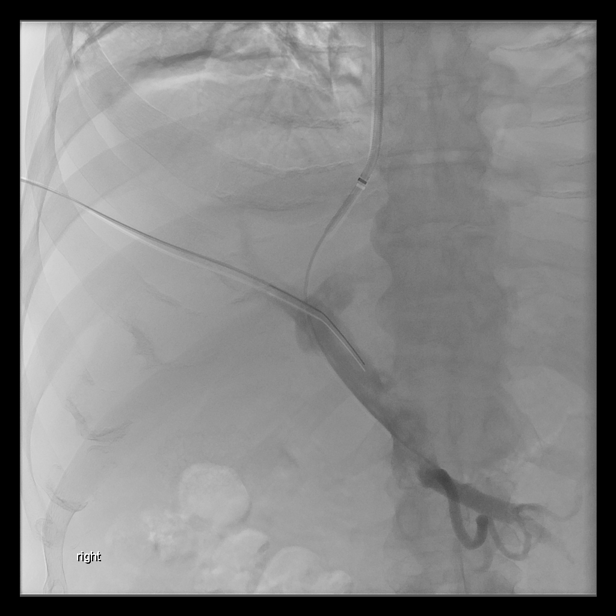

[Series 8: body 4 care · 1 of 8 slices shown (5 of 8)]
[im 8/8]
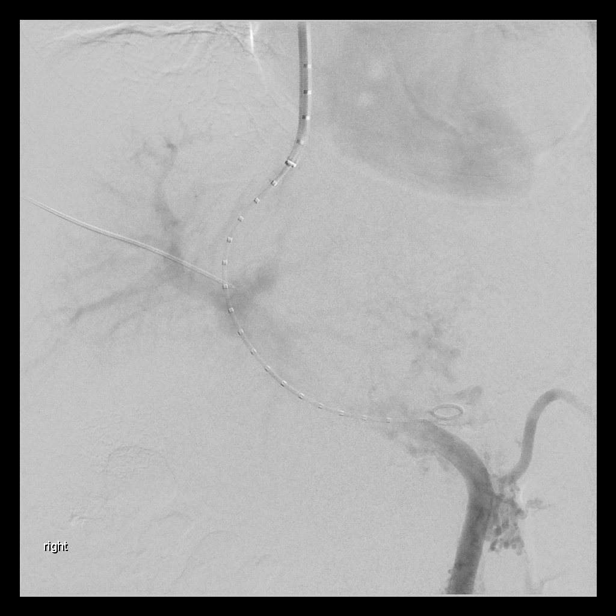

[Series 10: fl (-) angio · 1 of 2 slices shown (1 of 3)]
[im 1/2]
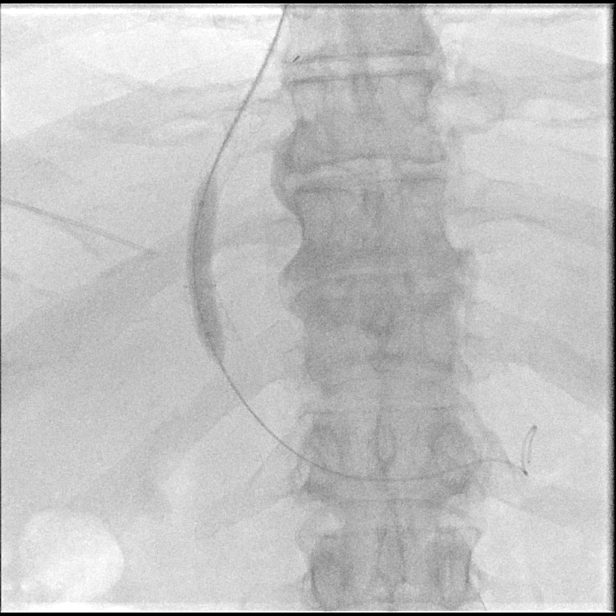

[Series 11: fl (-) angio · 1 of 2 slices shown (2 of 3)]
[im 1/2]
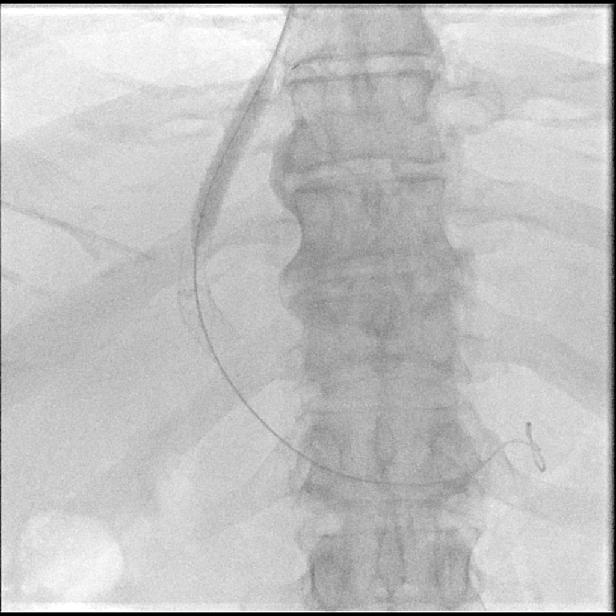

[Series 13: body 4 care · 1 of 6 slices shown (6 of 8)]
[im 1/6]
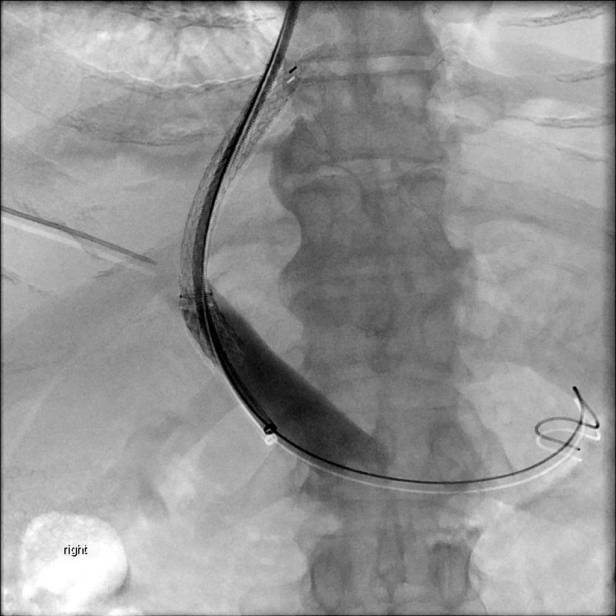

[Series 14: body 4 care · 1 of 8 slices shown (7 of 8)]
[im 1/8]
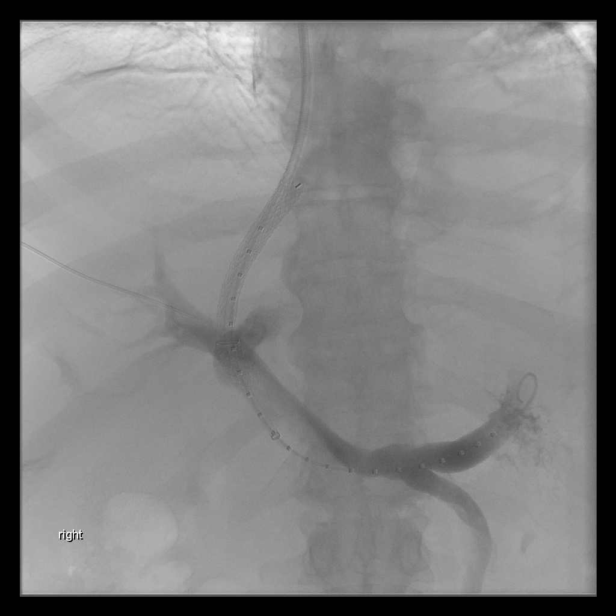

[Series 15: fl (-) angio · 1 of 1 slices shown (3 of 3)]
[im 1/1]
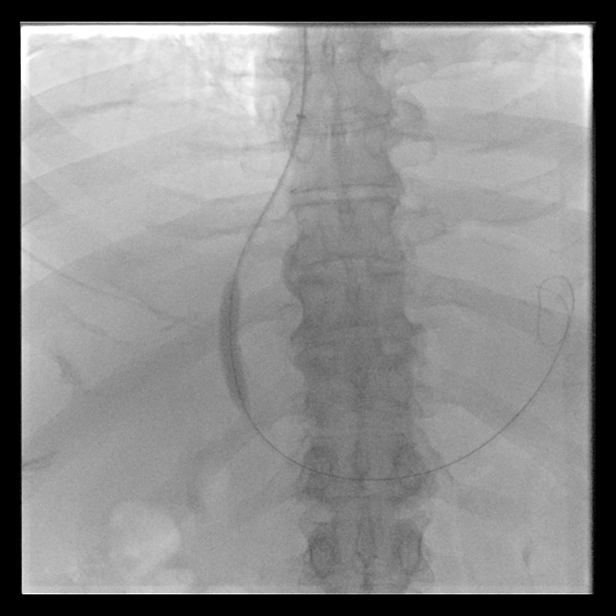

[Series 17: body 4 care · 2 of 9 slices shown (8 of 8)]
[im 1/9]
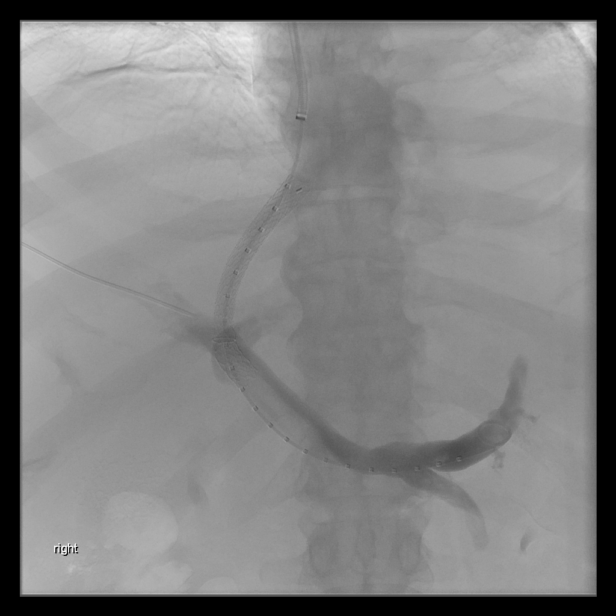
[im 9/9]
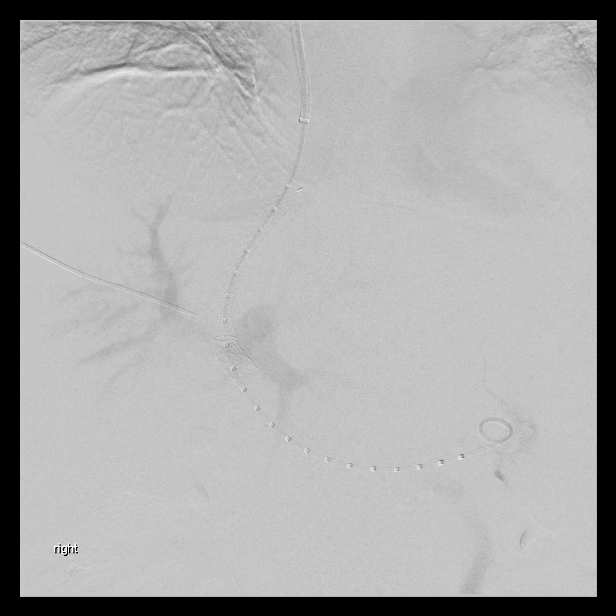

[13 of 24 positions shown; findings below may reference images not displayed]

EXAM:
TIPS creation

MEDICATIONS:
As antibiotic prophylaxis, 2 g Rocephin was ordered pre-procedure
and administered intravenously within one hour of incision.

ANESTHESIA/SEDATION:
General - as administered by the Anesthesia department

CONTRAST:  80 mL Omnipaque 300

FLUOROSCOPY TIME:  Fluoroscopy Time: 13 minutes 12 seconds (419
mGy).

COMPLICATIONS:
None immediate.

PROCEDURE:
Informed written consent was obtained from the patient after a
thorough discussion of the procedural risks, benefits and
alternatives. All questions were addressed. Maximal Sterile Barrier
Technique was utilized including caps, mask, sterile gowns, sterile
gloves, sterile drape, hand hygiene and skin antiseptic. A timeout
was performed prior to the initiation of the procedure.

Ultrasound was used to interrogate the liver. Under real-time
ultrasound guidance, a branch of the portal vein was punctured with
a 21 gauge Accustick needle. A microwire was advanced into the main
portal vein. The 3 French inner portion of the Accustick sheath was
then advanced into the portal vein. Transhepatic portal venogram was
then performed. The microwire was positioned at the confluence of
the left and right hepatic veins.

The right internal jugular vein was interrogated with ultrasound and
found to be widely patent. An image was obtained and stored for the
medical record. Local anesthesia was attained by infiltration with
1% lidocaine. A small dermatotomy was made. Under real-time
sonographic guidance, the vessel was punctured with a 21 gauge
micropuncture needle. Using standard technique, the initial micro
needle was exchanged over a 0.018 micro wire for a transitional 4
French micro sheath. The micro sheath was then exchanged over a
0.035 wire for a 10 French fascial dilator and the skin tract was
dilated.

A tips sheath was then advanced over the wire and into the right
atrium. Using an MPA catheter, the middle hepatic vein was
catheterized. A hepatic venogram was performed confirming that this
is indeed the middle hepatic vein. Tips sheath was advanced to the
middle hepatic vein. The colo KUTZ needle was introduced. Using AP
and lateral localization techniques, the right main portal vein was
punctured using a single pass. A glidewire was advanced into the
main portal vein. The colo KUTZ needle was removed. A 5 French
glide catheter was advanced over the Glidewire. The Glidewire was
then exchanged for an Amplatz wire. A marker pigtail catheter was
advanced into the splenic vein. Portal venography was performed.
Small varices are present arising from the posterior gastric vein.
And 8 cm covered via tore stent graft was selected. The Amplatz wire
was reintroduced in the pigtail catheter removed. The transhepatic
tract was pre dilated to 6 mm utilizing a 6 x 40 mm mustang balloon.

The tips sheath was advanced through the liver and into the main
portal vein. The via tore stent was then positioned and deployed.
The via tore stent was post dilated to 8 mm using an 8 x 40 mm
mustang balloon. The pigtail catheter was reintroduced over the
Amplatz wire. Portal venography demonstrates a widely patent middle
hepatic to right portal tips. No evidence of extravasation. The
varices arising from the posterior gastric vein are no longer
visualized.

The sheath was removed. Hemostasis was attained by manual pressure.
The transhepatic 3 French sheath was also removed. There was no
evidence of bleeding.
IMPRESSION: Successful creation of a middle hepatic to right portal venous tips
which was post dilated to 8 mm.

Patient will be admitted for overnight observation. Outpatient
follow-up with a TIPS ultrasound in 1 month.

## 2020-03-12 SURGERY — IR WITH ANESTHESIA
Anesthesia: General

## 2020-03-12 MED ORDER — GLIPIZIDE 5 MG PO TABS
10.0000 mg | ORAL_TABLET | Freq: Every day | ORAL | Status: DC
  Administered 2020-03-13: 10 mg via ORAL
  Filled 2020-03-12: qty 2

## 2020-03-12 MED ORDER — PROPOFOL 10 MG/ML IV BOLUS
INTRAVENOUS | Status: DC | PRN
  Administered 2020-03-12: 130 mg via INTRAVENOUS

## 2020-03-12 MED ORDER — OXYCODONE HCL 5 MG PO TABS: 10.0000 mg | ORAL_TABLET | ORAL | Status: DC | PRN

## 2020-03-12 MED ORDER — FENTANYL CITRATE (PF) 100 MCG/2ML IJ SOLN
INTRAMUSCULAR | Status: AC
  Filled 2020-03-12: qty 2

## 2020-03-12 MED ORDER — SODIUM CHLORIDE 0.9% IV SOLUTION: Freq: Once | INTRAVENOUS | Status: DC

## 2020-03-12 MED ORDER — DEXAMETHASONE SODIUM PHOSPHATE 10 MG/ML IJ SOLN
INTRAMUSCULAR | Status: DC | PRN
  Administered 2020-03-12: 5 mg via INTRAVENOUS

## 2020-03-12 MED ORDER — SUCCINYLCHOLINE CHLORIDE 200 MG/10ML IV SOSY
PREFILLED_SYRINGE | INTRAVENOUS | Status: DC | PRN
  Administered 2020-03-12: 120 mg via INTRAVENOUS

## 2020-03-12 MED ORDER — ALBUMIN HUMAN 25 % IV SOLN: INTRAVENOUS | Status: DC | PRN

## 2020-03-12 MED ORDER — SODIUM CHLORIDE 0.9 % IV SOLN
2.0000 g | INTRAVENOUS | Status: AC
  Administered 2020-03-12: 2 g via INTRAVENOUS
  Filled 2020-03-12: qty 20

## 2020-03-12 MED ORDER — ROCURONIUM BROMIDE 50 MG/5ML IV SOSY
PREFILLED_SYRINGE | INTRAVENOUS | Status: DC | PRN
  Administered 2020-03-12: 60 mg via INTRAVENOUS

## 2020-03-12 MED ORDER — PROPRANOLOL HCL 20 MG PO TABS
20.0000 mg | ORAL_TABLET | Freq: Two times a day (BID) | ORAL | Status: DC
  Administered 2020-03-12 – 2020-03-13 (×2): 20 mg via ORAL
  Filled 2020-03-12 (×2): qty 1

## 2020-03-12 MED ORDER — LIDOCAINE 2% (20 MG/ML) 5 ML SYRINGE
INTRAMUSCULAR | Status: DC | PRN
  Administered 2020-03-12: 20 mg via INTRAVENOUS
  Administered 2020-03-12: 60 mg via INTRAVENOUS

## 2020-03-12 MED ORDER — CEFAZOLIN SODIUM-DEXTROSE 2-4 GM/100ML-% IV SOLN
2.0000 g | INTRAVENOUS | Status: DC
  Filled 2020-03-12: qty 100

## 2020-03-12 MED ORDER — SUGAMMADEX SODIUM 200 MG/2ML IV SOLN
INTRAVENOUS | Status: DC | PRN
  Administered 2020-03-12: 200 mg via INTRAVENOUS

## 2020-03-12 MED ORDER — LIDOCAINE HCL 1 % IJ SOLN
INTRAMUSCULAR | Status: AC
  Filled 2020-03-12: qty 20

## 2020-03-12 MED ORDER — LACTATED RINGERS IV SOLN: INTRAVENOUS | Status: DC | PRN

## 2020-03-12 MED ORDER — ALBUMIN HUMAN 25 % IV SOLN
INTRAVENOUS | Status: AC
  Filled 2020-03-12: qty 100

## 2020-03-12 MED ORDER — LIDOCAINE HCL 1 % IJ SOLN
INTRAMUSCULAR | Status: DC | PRN
  Administered 2020-03-12: 5 mL

## 2020-03-12 MED ORDER — PHENYLEPHRINE HCL-NACL 10-0.9 MG/250ML-% IV SOLN
INTRAVENOUS | Status: DC | PRN
  Administered 2020-03-12: 15 ug/min via INTRAVENOUS

## 2020-03-12 MED ORDER — FENTANYL CITRATE (PF) 250 MCG/5ML IJ SOLN
INTRAMUSCULAR | Status: DC | PRN
  Administered 2020-03-12: 50 ug via INTRAVENOUS

## 2020-03-12 MED ORDER — LACTULOSE 10 GM/15ML PO SOLN
20.0000 g | Freq: Three times a day (TID) | ORAL | Status: DC
  Administered 2020-03-12 – 2020-03-13 (×3): 20 g via ORAL
  Filled 2020-03-12 (×3): qty 30

## 2020-03-12 MED ORDER — ALBUMIN HUMAN 5 % IV SOLN: INTRAVENOUS | Status: DC | PRN

## 2020-03-12 MED ORDER — LACTATED RINGERS IV SOLN: INTRAVENOUS | Status: DC

## 2020-03-12 MED ORDER — ONDANSETRON HCL 4 MG/2ML IJ SOLN
INTRAMUSCULAR | Status: DC | PRN
  Administered 2020-03-12: 4 mg via INTRAVENOUS

## 2020-03-12 MED ORDER — FUROSEMIDE 40 MG PO TABS
40.0000 mg | ORAL_TABLET | Freq: Every day | ORAL | Status: DC
  Administered 2020-03-12 – 2020-03-13 (×2): 40 mg via ORAL
  Filled 2020-03-12 (×2): qty 1

## 2020-03-12 MED ORDER — IOHEXOL 300 MG/ML  SOLN
150.0000 mL | Freq: Once | INTRAMUSCULAR | Status: AC | PRN
  Administered 2020-03-12: 80 mL via INTRAVENOUS

## 2020-03-12 NOTE — Plan of Care (Signed)

## 2020-03-12 NOTE — Anesthesia Procedure Notes (Signed)
Procedure Name: Intubation Date/Time: 03/12/2020 9:10 AM Performed by: Shirlyn Goltz, CRNA Pre-anesthesia Checklist: Patient identified, Emergency Drugs available, Suction available and Patient being monitored Patient Re-evaluated:Patient Re-evaluated prior to induction Oxygen Delivery Method: Circle system utilized Preoxygenation: Pre-oxygenation with 100% oxygen Induction Type: IV induction and Rapid sequence Laryngoscope Size: Mac and 4 Grade View: Grade I Tube type: Oral Tube size: 7.0 mm Number of attempts: 1 Airway Equipment and Method: Stylet Placement Confirmation: ETT inserted through vocal cords under direct vision,  positive ETCO2 and breath sounds checked- equal and bilateral Secured at: 23 cm Tube secured with: Tape Dental Injury: Teeth and Oropharynx as per pre-operative assessment

## 2020-03-12 NOTE — Procedures (Signed)
Interventional Radiology Procedure Note  Procedure: Paracentesis and TIPS creation  Complications: None  Estimated Blood Loss: None  Recommendations: - Admit for obs - Begin lactulose - ADAT   Signed,  Criselda Peaches, MD

## 2020-03-12 NOTE — Progress Notes (Signed)
Foley d/ced at [employer]

## 2020-03-12 NOTE — H&P (Deleted)
  The note originally documented on this encounter has been moved the the encounter in which it belongs.  

## 2020-03-12 NOTE — H&P (Signed)
Chief Complaint: Patient was seen in consultation today for refractory ascites  Supervising Physician: Jacqulynn Cadet  Patient Status: [facility] - Out-pt  History of Present Illness: Martin Strong is a 54 y.o. male with history of nonalcoholic steatohepatitis related cirrhosis initially diagnosed in February 9407 and complicated by bleeding esophageal varices in October 2019 which was successfully treated by endoscopic banding and propranolol for dual prophylaxis.  Additionally, patient began to develop decompensation and large-volume ascites in October 2020 requiring large-volume paracentesis on a bi-weekly basis since this time. He met with Dr. Laurence Ferrari in consultation 02/27/20 to discuss management of his recurrent ascites.  He has elected for TIPS procedure and presents for procedure today.   Patient assessed in short stay this AM.  He is anxiously anticipating the procedure today.  He has significant abdominal distention. He denies fever, chills, abdominal pain, nausea, vomiting, dysuria, confusion.  He has been NPO.  He does not take blood thinners.     Past Medical History:  Diagnosis Date  . Anemia   . Arthritis   . Cirrhosis ([place])   . Diabetes mellitus without complication ([place])   . Dyspnea   . Fatty liver   . GERD (gastroesophageal reflux disease)   . GI bleed   . Hyperlipidemia   . Hypertension     Past Surgical History:  Procedure Laterality Date  . BIOPSY  02/14/2019   Procedure: BIOPSY;  Surgeon: Lavena Bullion, DO;  Location: [facility];  Service: Gastroenterology;;  . BIOPSY  07/22/2019   Procedure: BIOPSY;  Surgeon: Lavena Bullion, DO;  Location: [facility];  Service: Gastroenterology;;  . ESOPHAGEAL BANDING  10/07/2018   Procedure: ESOPHAGEAL BANDING;  Surgeon: Lavena Bullion, DO;  Location: [place];  Service: Gastroenterology;;  . ESOPHAGEAL BANDING N/A 01/03/2019   Procedure: ESOPHAGEAL BANDING;  Surgeon: Lavena Bullion, DO;   Location: [facility];  Service: Gastroenterology;  Laterality: N/A;  . ESOPHAGEAL BANDING  02/14/2019   Procedure: ESOPHAGEAL BANDING;  Surgeon: Lavena Bullion, DO;  Location: [facility];  Service: Gastroenterology;;  . esophageal bands    . ESOPHAGOGASTRODUODENOSCOPY Left 07/22/2019   Procedure: ESOPHAGOGASTRODUODENOSCOPY (EGD) WITH POSSIBLE BANDING;  Surgeon: Lavena Bullion, DO;  Location: [facility];  Service: Gastroenterology;  Laterality: Left;  . ESOPHAGOGASTRODUODENOSCOPY (EGD) WITH PROPOFOL N/A 10/07/2018   Procedure: ESOPHAGOGASTRODUODENOSCOPY (EGD) WITH PROPOFOL;  Surgeon: Lavena Bullion, DO;  Location: [place];  Service: Gastroenterology;  Laterality: N/A;  . ESOPHAGOGASTRODUODENOSCOPY (EGD) WITH PROPOFOL N/A 01/03/2019   Procedure: ESOPHAGOGASTRODUODENOSCOPY (EGD) WITH PROPOFOL;  Surgeon: Lavena Bullion, DO;  Location: [facility];  Service: Gastroenterology;  Laterality: N/A;  . ESOPHAGOGASTRODUODENOSCOPY (EGD) WITH PROPOFOL N/A 02/14/2019   Procedure: ESOPHAGOGASTRODUODENOSCOPY (EGD) WITH PROPOFOL;  Surgeon: Lavena Bullion, DO;  Location: [facility];  Service: Gastroenterology;  Laterality: N/A;  . IR PARACENTESIS  10/05/2019  . IR PARACENTESIS  10/24/2019  . IR PARACENTESIS  11/16/2019  . IR PARACENTESIS  12/25/2019  . IR PARACENTESIS  01/05/2020  . IR PARACENTESIS  01/23/2020  . IR PARACENTESIS  02/08/2020  . IR PARACENTESIS  02/16/2020  . IR PARACENTESIS  02/28/2020  . IR RADIOLOGIST EVAL & MGMT  12/19/2019  . IR RADIOLOGIST EVAL & MGMT  02/27/2020    Allergies: Nadolol  Medications: Prior to Admission medications   Medication Sig Start Date End Date Taking? Authorizing Provider  cholecalciferol (VITAMIN D) 1000 units tablet Take 1,000 Units by mouth 2 (two) times daily.    [provider]  ciprofloxacin (  CIPRO) 500 MG tablet Take Cipro 750 mg weekly Patient taking differently: Take 500 mg by mouth every Monday.  02/26/20   Cirigliano, Vito V,  DO  dapagliflozin propanediol (FARXIGA) 10 MG TABS tablet Take 10 mg by mouth at bedtime.    [provider]  feeding supplement, ENSURE ENLIVE, (ENSURE ENLIVE) LIQD Take 237 mLs by mouth 2 (two) times daily between meals. 01/28/20   Hosie Poisson, MD  ferrous sulfate (KP FERROUS SULFATE) 325 (65 FE) MG tablet Take 1 tablet (325 mg total) by mouth daily with breakfast. Patient taking differently: Take 325 mg by mouth 2 (two) times daily.  05/30/19   Cirigliano, Vito V, DO  furosemide (LASIX) 20 MG tablet Take 1 tablet (20 mg total) by mouth daily. Patient not taking: Reported on 03/05/2020 12/27/19   Cirigliano, Vito V, DO  furosemide (LASIX) 40 MG tablet Take 40 mg by mouth daily.    [provider]  glipiZIDE (GLUCOTROL) 10 MG tablet Take 10 mg by mouth daily before breakfast.    [provider]  metFORMIN (GLUCOPHAGE) 1000 MG tablet Take 1,000 mg by mouth 2 (two) times daily with a meal.    [provider]  Multiple Vitamin (MULTIVITAMIN WITH MINERALS) TABS tablet Take 1 tablet by mouth at bedtime.    [provider]  propranolol (INDERAL) 40 MG tablet Take 0.5 tablets (20 mg total) by mouth 2 (two) times daily. 01/27/20   Hosie Poisson, MD     Family History  Problem Relation Age of Onset  . Diabetes Other   . Esophageal cancer Father   . Colon cancer Neg Hx   . Rectal cancer Neg Hx   . Stomach cancer Neg Hx     Social History   Socioeconomic History  . Marital status: Married    Spouse name: Not on file  . Number of children: 2  . Years of education: Not on file  . Highest education level: Not on file  Occupational History  . Not on file  Tobacco Use  . Smoking status: Never Smoker  . Smokeless tobacco: Never Used  Substance and Sexual Activity  . Alcohol use: Never  . Drug use: Never  . Sexual activity: Not on file  Other Topics Concern  . Not on file  Social History Narrative  . Not on file   Social Determinants of Health    Financial Resource Strain:   . Difficulty of Paying Living Expenses:   Food Insecurity:   . Worried About Charity fundraiser in the Last Year:   . Arboriculturist in the Last Year:   Transportation Needs:   . Film/video editor (Medical):   Marland Kitchen Lack of Transportation (Non-Medical):   Physical Activity:   . Days of Exercise per Week:   . Minutes of Exercise per Session:   Stress:   . Feeling of Stress :   Social Connections:   . Frequency of Communication with Friends and Family:   . Frequency of Social Gatherings with Friends and Family:   . Attends Religious Services:   . Active Member of Clubs or Organizations:   . Attends Archivist Meetings:   Marland Kitchen Marital Status:      Review of Systems: A 12 point ROS discussed and pertinent positives are indicated in the HPI above.  All other systems are negative.  Review of Systems  Constitutional: Negative for fatigue and fever.  Respiratory: Negative for cough and shortness of breath.  Cardiovascular: Negative for chest pain.  Gastrointestinal: Positive for abdominal distention. Negative for abdominal pain, constipation, diarrhea, nausea and vomiting.  Genitourinary: Negative for dysuria.  Musculoskeletal: Negative for back pain.  Psychiatric/Behavioral: Negative for behavioral problems and confusion.    Vital Signs: BP (!) 146/91   Pulse 84   Temp 98.6 F (37 C) (Oral)   Resp 18   Ht 6' 1"  (1.854 m)   Wt 230 lb (104.3 kg)   SpO2 97%   BMI 30.34 kg/m   Physical Exam Vitals and nursing note reviewed.  Constitutional:      General: He is not in acute distress.    Appearance: Normal appearance. He is not ill-appearing.  HENT:     Mouth/Throat:     Mouth: Mucous membranes are moist.     Pharynx: Oropharynx is clear.  Cardiovascular:     Rate and Rhythm: Normal rate and regular rhythm.  Pulmonary:     Effort: Pulmonary effort is normal. No respiratory distress.     Breath sounds: Normal breath sounds.   Abdominal:     General: There is distension.     Tenderness: There is no abdominal tenderness.  Skin:    General: Skin is warm and dry.  Neurological:     General: No focal deficit present.     Mental Status: He is alert and oriented to person, place, and time.  Psychiatric:        Mood and Affect: Mood normal.        Behavior: Behavior normal.        Thought Content: Thought content normal.        Judgment: Judgment normal.      MD Evaluation Airway: WNL Heart: WNL Abdomen: WNL Chest/ Lungs: WNL ASA  Classification: 3 Mallampati/Airway Score: Two   Imaging: IR Radiologist Eval & Mgmt  Result Date: 02/27/2020 Please refer to notes tab for details about interventional procedure. (Op Note)  IR Paracentesis  Result Date: 02/28/2020 INDICATION: Patient with history of cirrhosis, recurrent ascites. Request is made for diagnostic and therapeutic paracentesis. EXAM: ULTRASOUND GUIDED DIAGNOSTIC AND THERAPEUTIC PARACENTESIS MEDICATIONS: 10 mL 1% lidocaine COMPLICATIONS: None immediate. PROCEDURE: Informed written consent was obtained from the patient after a discussion of the risks, benefits and alternatives to treatment. A timeout was performed prior to the initiation of the procedure. Initial ultrasound scanning demonstrates a large amount of ascites within the right lower abdominal quadrant. The right lower abdomen was prepped and draped in the usual sterile fashion. 1% lidocaine was used for local anesthesia. Following this, a 19 gauge, 7-cm, Yueh catheter was introduced. An ultrasound image was saved for documentation purposes. The paracentesis was performed. The catheter was removed and a dressing was applied. The patient tolerated the procedure well without immediate post procedural complication. Patient received post-procedure intravenous albumin; see nursing notes for details. FINDINGS: A total of approximately 6.0 liters of yellow fluid was removed. Samples were sent to the  laboratory as requested by the clinical team. IMPRESSION: Successful ultrasound-guided diagnostic and therapeutic paracentesis yielding 6.0 liters of peritoneal fluid. Read by: Brynda Greathouse PA-C Electronically Signed   By: Markus Daft M.D.   On: 02/28/2020 10:15   IR Paracentesis  Result Date: 02/16/2020 INDICATION: Patient with history of abdominal distension, recurrent ascites. Request is made for diagnostic and therapeutic paracentesis. EXAM: ULTRASOUND GUIDED DIAGNOSTIC AND THERAPEUTIC PARACENTESIS MEDICATIONS: 10 mL 1% lidocaine COMPLICATIONS: None immediate. PROCEDURE: Informed written consent was obtained from the patient after a discussion of the risks,  benefits and alternatives to treatment. A timeout was performed prior to the initiation of the procedure. Initial ultrasound scanning demonstrates a large amount of ascites within the right lower abdominal quadrant. The right lower abdomen was prepped and draped in the usual sterile fashion. 1% lidocaine was used for local anesthesia. Following this, a 19 gauge, 7-cm, Yueh catheter was introduced. An ultrasound image was saved for documentation purposes. The paracentesis was performed. The catheter was removed and a dressing was applied. The patient tolerated the procedure well without immediate post procedural complication. Patient received post-procedure intravenous albumin; see nursing notes for details. FINDINGS: A total of approximately 6.0 liters of yellow, cloudy fluid was removed. Samples were sent to the laboratory as requested by the clinical team. IMPRESSION: Successful ultrasound-guided diagnostic and therapeutic paracentesis yielding 6.0 liters of peritoneal fluid. Read by: Brynda Greathouse PA-C Electronically Signed   By: Lucrezia Europe M.D.   On: 02/16/2020 17:50    Labs:  CBC: Recent Labs    01/27/20 1100 01/31/20 1339 02/08/20 1132 03/07/20 1155  WBC 4.9 4.5 3.8* 7.2  HGB 8.6* 8.6 Repeated and verified X2.* 8.1* 8.7*  HCT 27.2*  26.3* 26.2* 29.1*  PLT 105* 138.0* 115* 155    COAGS: Recent Labs    10/02/19 1503 10/13/19 1411 01/23/20 1938 02/08/20 1132  INR 1.3* 1.3* 1.5* 1.3*  APTT  --   --  43*  --     BMP: Recent Labs    01/26/20 0437 01/26/20 0437 01/27/20 1100 01/31/20 1339 02/08/20 1132 03/07/20 1155  NA 134*   < > 133* 135 138 136  K 4.6   < > 4.8 5.3 No hemolysis seen* 4.9 3.6  CL 107   < > 103 105 106 100  CO2 20*   < > 22 25 23 22   GLUCOSE 165*   < > 191* 256* 123* 92  BUN 20   < > 24* 38* 32* 23*  CALCIUM 8.4*   < > 8.7* 8.6 8.9 8.7*  CREATININE 0.98   < > 0.94 0.98 1.08 1.04  GFRNONAA >60  --  >60  --  >60 >60  GFRAA >60  --  >60  --  >60 >60   < > = values in this interval not displayed.    LIVER FUNCTION TESTS: Recent Labs    01/23/20 2008 01/27/20 1100 02/08/20 1132 03/07/20 1155  BILITOT 0.6 1.0 0.7 1.4*  AST 25 28 27  41  ALT 23 27 22 27   ALKPHOS 55 108 93 70  PROT 7.0 7.2 7.4 8.1  ALBUMIN 3.7 3.7 3.6 3.2*    TUMOR MARKERS: No results for input(s): AFPTM, CEA, CA199, CHROMGRNA in the last 8760 hours.  Assessment and Plan: Patient with past medical history of NASH cirrhosis, cariceal bleeding s/p banding presents with complaint of recurrent ascites.  He has been evaluated in conjunction with GI, Dr. Bryan Lemma, for TIPS procedure. Case reviewed by Dr. Laurence Ferrari who has met with patient in consultation approves patient for procedure.  Patient presents today in their usual state of health.  He has been NPO and is not currently on blood thinners.  MELDNa 13  He understands he will likely be admitted for observation overnight.   Risks and benefits of TIPS, BRTO and/or additional variceal embolization were discussed with the patient and/or the patient's family including, but not limited to, infection, bleeding, damage to adjacent structures, worsening hepatic and/or cardiac function, worsening and/or the development of altered mental status/encephalopathy, non-target  embolization and  death.   This interventional procedure involves the use of X-rays and because of the nature of the planned procedure, it is possible that we will have prolonged use of X-ray fluoroscopy.  Potential radiation risks to you include (but are not limited to) the following: - A slightly elevated risk for cancer  several years later in life. This risk is typically less than 0.5% percent. This risk is low in comparison to the normal incidence of human cancer, which is 33% for women and 50% for men according to the [employer]. - Radiation induced injury can include skin redness, resembling a rash, tissue breakdown / ulcers and hair loss (which can be temporary or permanent).   The likelihood of either of these occurring depends on the difficulty of the procedure and whether you are sensitive to radiation due to previous procedures, disease, or genetic conditions.   IF your procedure requires a prolonged use of radiation, you will be notified and given written instructions for further action.  It is your responsibility to monitor the irradiated area for the 2 weeks following the procedure and to notify your physician if you are concerned that you have suffered a radiation induced injury.    All of the patient's questions were answered, patient is agreeable to proceed.  Consent signed and in chart.  Thank you for this interesting consult.  I greatly enjoyed meeting Torrence Hammack and look forward to participating in their care.  A copy of this report was sent to the requesting provider on this date.  Electronically Signed: Docia Barrier, PA 03/12/2020, 8:10 AM   I spent a total of    25 Minutes in face to face in clinical consultation, greater than 50% of which was counseling/coordinating care for recurrent ascites, cirrhosis.

## 2020-03-12 NOTE — Transfer of Care (Signed)
Immediate Anesthesia Transfer of Care Note  Patient: Martin Strong  Procedure(s) Performed: TIPS (N/A )  Patient Location: PACU  Anesthesia Type:General  Level of Consciousness: awake and patient cooperative  Airway & Oxygen Therapy: Patient Spontanous Breathing and Patient connected to face mask oxygen  Post-op Assessment: Report given to RN and Post -op Vital signs reviewed and stable  Post vital signs: Reviewed and stable  Last Vitals:  Vitals Value Taken Time  BP 113/70 03/12/20 1153  Temp    Pulse 91 03/12/20 1154  Resp 13 03/12/20 1154  SpO2 92 % 03/12/20 1154  Vitals shown include unvalidated device data.  Last Pain:  Vitals:   03/12/20 0730  PainSc: 0-No pain      Patients Stated Pain Goal: 2 (99/68/95 7022)  Complications: No apparent anesthesia complications

## 2020-03-13 ENCOUNTER — Other Ambulatory Visit: Payer: Self-pay | Admitting: Physician Assistant

## 2020-03-13 ENCOUNTER — Encounter: Payer: Self-pay | Admitting: *Deleted

## 2020-03-13 DIAGNOSIS — K7460 Unspecified cirrhosis of liver: Secondary | ICD-10-CM | POA: Diagnosis not present

## 2020-03-13 LAB — COMPREHENSIVE METABOLIC PANEL
ALT: 26 U/L (ref 0–44)
AST: 37 U/L (ref 15–41)
Albumin: 2.9 g/dL — ABNORMAL LOW (ref 3.5–5.0)
Alkaline Phosphatase: 50 U/L (ref 38–126)
Anion gap: 9 (ref 5–15)
BUN: 18 mg/dL (ref 6–20)
CO2: 27 mmol/L (ref 22–32)
Calcium: 8.3 mg/dL — ABNORMAL LOW (ref 8.9–10.3)
Chloride: 101 mmol/L (ref 98–111)
Creatinine, Ser: 1.04 mg/dL (ref 0.61–1.24)
GFR calc Af Amer: >60 mL/min (ref >60)
GFR calc non Af Amer: >60 mL/min (ref >60)
Glucose, Bld: 188 mg/dL — ABNORMAL HIGH (ref 70–99)
Potassium: 3.4 mmol/L — ABNORMAL LOW (ref 3.5–5.1)
Sodium: 137 mmol/L (ref 135–145)
Total Bilirubin: 0.9 mg/dL (ref 0.3–1.2)
Total Protein: 5.9 g/dL — ABNORMAL LOW (ref 6.5–8.1)

## 2020-03-13 LAB — CBC
HCT: 22.4 % — ABNORMAL LOW (ref 39.0–52.0)
Hemoglobin: 7.1 g/dL — ABNORMAL LOW (ref 13.0–17.0)
MCH: 29 pg (ref 26.0–34.0)
MCHC: 31.7 g/dL (ref 30.0–36.0)
MCV: 91.4 fL (ref 80.0–100.0)
Platelets: 106 10*3/uL — ABNORMAL LOW (ref 150–400)
RBC: 2.45 MIL/uL — ABNORMAL LOW (ref 4.22–5.81)
RDW: 14.6 % (ref 11.5–15.5)
WBC: 4.7 10*3/uL (ref 4.0–10.5)
nRBC: 0 % (ref 0.0–0.2)

## 2020-03-13 LAB — PROTIME-INR
INR: 1.6 — ABNORMAL HIGH (ref 0.8–1.2)
Prothrombin Time: 18.6 seconds — ABNORMAL HIGH (ref 11.4–15.2)

## 2020-03-13 MED ORDER — LACTULOSE 10 GM/15ML PO SOLN: 20.0000 g | Freq: Three times a day (TID) | ORAL | 0 refills | Status: AC

## 2020-03-13 NOTE — Anesthesia Postprocedure Evaluation (Signed)
Anesthesia Post Note  Patient: Martin Strong  Procedure(s) Performed: TIPS (N/A )     Patient location during evaluation: PACU Anesthesia Type: General Level of consciousness: awake and alert Pain management: pain level controlled Vital Signs Assessment: post-procedure vital signs reviewed and stable Respiratory status: spontaneous breathing, nonlabored ventilation, respiratory function stable and patient connected to nasal cannula oxygen Cardiovascular status: blood pressure returned to baseline and stable Postop Assessment: no apparent nausea or vomiting Anesthetic complications: no    Last Vitals:  Vitals:   03/13/20 1012 03/13/20 1013  BP: (!) 104/59 (!) 104/59  Pulse: 83 82  Resp:  16  Temp:  36.9 C  SpO2:  100%    Last Pain:  Vitals:   03/13/20 1013  TempSrc: Oral  PainSc:                  Tiajuana Amass

## 2020-03-13 NOTE — Discharge Summary (Addendum)
Patient ID: Martin Strong MRN: [id number] DOB/AGE: [id number] y.o.  Admit date: 03/12/2020 Discharge date: 03/13/2020  Supervising Physician: Aletta Edouard  Patient Status: [facility] - In-pt  Admission Diagnoses: Cirrhosis with recurrent large volume ascites  Discharge Diagnoses:  Active Problems:   Cirrhosis of liver without ascites [facility])   Discharged Condition: good  Hospital Course:  Martin Strong is a 54 y.o. male with cirrhosis nonalcoholic steatohepatitis.  He was initially diagnosed in February 2017.  He had bleeding esophageal varices in October 2019 which was successfully treated by endoscopic banding.  He started to develop large-volume ascites in October 2020 requiring large-volume paracentesis on a bi-weekly basis since that time.   He was seen by Dr. Laurence Ferrari for TIPS consult 02/27/20 and was determined to be a good candidate for the procedure.  He underwent successful TIPS yesterday by Dr. Laurence Ferrari.  He has done well overnight. He is tolerating a diet. He is up ambulating in his room. He has had a bowel movement this morning.  He is ready for discharge.  MELD today is 12.  Significant Diagnostic Studies:  Lab Results  Component Value Date   WBC 4.7 03/13/2020   HGB 7.1 (L) 03/13/2020   HCT 22.4 (L) 03/13/2020   MCV 91.4 03/13/2020   PLT 106 (L) 03/13/2020   Lab Results  Component Value Date   CREATININE 1.04 03/13/2020   BUN 18 03/13/2020   NA 137 03/13/2020   K 3.4 (L) 03/13/2020   CL 101 03/13/2020   CO2 27 03/13/2020   Lab Results  Component Value Date   ALT 26 03/13/2020   AST 37 03/13/2020   ALKPHOS 50 03/13/2020   BILITOT 0.9 03/13/2020   Lab Results  Component Value Date   INR 1.6 (H) 03/13/2020   INR 1.4 (H) 03/12/2020   INR 1.3 (H) 02/08/2020     Treatments:  CLINICAL DATA:  54 year old male with non alcoholic steatohepatitis cirrhosis complicated by recurrent large volume ascites. He has been diuretic  insensitive and has also suffered a recent episode of acute kidney injury. Therefore, he presents for placement of an elective tips.  EXAM: TIPS creation  MEDICATIONS: As antibiotic prophylaxis, 2 g Rocephin was ordered pre-procedure and administered intravenously within one hour of incision.  ANESTHESIA/SEDATION: General - as administered by the Anesthesia department  CONTRAST:  80 mL Omnipaque 300  FLUOROSCOPY TIME:  Fluoroscopy Time: 13 minutes 12 seconds (419 mGy).  COMPLICATIONS: None immediate.  PROCEDURE: Informed written consent was obtained from the patient after a thorough discussion of the procedural risks, benefits and alternatives. All questions were addressed. Maximal Sterile Barrier Technique was utilized including caps, mask, sterile gowns, sterile gloves, sterile drape, hand hygiene and skin antiseptic. A timeout was performed prior to the initiation of the procedure.  Ultrasound was used to interrogate the liver. Under real-time ultrasound guidance, a branch of the portal vein was punctured with a 21 gauge Accustick needle. A microwire was advanced into the main portal vein. The 3 [place] inner portion of the Accustick sheath was then advanced into the portal vein. Transhepatic portal venogram was then performed. The microwire was positioned at the confluence of the left and right hepatic veins.  The right internal jugular vein was interrogated with ultrasound and found to be widely patent. An image was obtained and stored for the medical record. Local anesthesia was attained by infiltration with 1% lidocaine. A small dermatotomy was made. Under real-time sonographic guidance, the vessel was punctured with  a 21 gauge micropuncture needle. Using standard technique, the initial micro needle was exchanged over a 0.018 micro wire for a transitional 4 [place] micro sheath. The micro sheath was then exchanged over a 0.035 wire for a 10 French fascial  dilator and the skin tract was dilated.  A tips sheath was then advanced over the wire and into the right atrium. Using an MPA catheter, the middle hepatic vein was catheterized. A hepatic venogram was performed confirming that this is indeed the middle hepatic vein. Tips sheath was advanced to the middle hepatic vein. The colo pinto needle was introduced. Using AP and lateral localization techniques, the right main portal vein was punctured using a single pass. A glidewire was advanced into the main portal vein. The colo pinto needle was removed. A 5 French glide catheter was advanced over the [employer]. The Glidewire was then exchanged for an Amplatz wire. A marker pigtail catheter was advanced into the splenic vein. Portal venography was performed. Small varices are present arising from the posterior gastric vein. And 8 cm covered via tore stent graft was selected. The Amplatz wire was reintroduced in the pigtail catheter removed. The transhepatic tract was pre dilated to 6 mm utilizing a 6 x 40 mm mustang balloon.  The tips sheath was advanced through the liver and into the main portal vein. The via tore stent was then positioned and deployed. The via tore stent was post dilated to 8 mm using an 8 x 40 mm mustang balloon. The pigtail catheter was reintroduced over the Amplatz wire. Portal venography demonstrates a widely patent middle hepatic to right portal tips. No evidence of extravasation. The varices arising from the posterior gastric vein are no longer visualized.  The sheath was removed. Hemostasis was attained by manual pressure. The transhepatic 3 French sheath was also removed. There was no evidence of bleeding.  IMPRESSION: Successful creation of a middle hepatic to right portal venous tips which was post dilated to 8 mm.  Patient will be admitted for overnight observation. Outpatient follow-up with a TIPS ultrasound in 1 month.  Signed,  Criselda Peaches, MD, [facility]  [facility]   Electronically Signed   By: Jacqulynn Cadet M.D.   On: 03/12/2020 20:43  Discharge Exam: Blood pressure (!) 102/56, pulse 90, temperature 98 F (36.7 C), temperature source Oral, resp. rate 16, height 6' 1"  (1.854 m), weight 104.3 kg, SpO2 95 %. Alert, NAD Ambulating in room Heart RRR No respiratory distress Right jugular and right abdomen sites clean, no bleeding. Abdomen distended  Disposition: Discharge disposition: 01-Home or Self Care      IR office will call patient with appointment date and time to be seein in about 4-6 weeks.  Discharge Instructions    Call MD for:  difficulty breathing, headache or visual disturbances   Complete by: As directed    Call MD for:  persistant nausea and vomiting   Complete by: As directed    Call MD for:  redness, tenderness, or signs of infection (pain, swelling, redness, odor or green/yellow discharge around incision site)   Complete by: As directed    Call MD for:  severe uncontrolled pain   Complete by: As directed    Call MD for:  temperature >100.4   Complete by: As directed    Diet - low sodium heart healthy   Complete by: As directed    Discharge wound care:   Complete by: As directed  Ok to remove dressings tomorrow morning. If the one on the neck comes off, just simply place a bandaid. Ok to shower starting tomorrow morning.   Increase activity slowly   Complete by: As directed         Electronically Signed: Murrell Redden, PA-C 03/13/2020, 9:36 AM     I have spent Less Than 30 Minutes discharging [place].

## 2020-03-19 ENCOUNTER — Other Ambulatory Visit: Payer: Self-pay

## 2020-03-19 ENCOUNTER — Other Ambulatory Visit: Payer: Self-pay | Admitting: Physician Assistant

## 2020-03-19 DIAGNOSIS — Z95828 Presence of other vascular implants and grafts: Secondary | ICD-10-CM

## 2020-03-19 DIAGNOSIS — K7460 Unspecified cirrhosis of liver: Secondary | ICD-10-CM

## 2020-03-19 DIAGNOSIS — R188 Other ascites: Secondary | ICD-10-CM

## 2020-03-26 ENCOUNTER — Ambulatory Visit: Payer: BC Managed Care – PPO

## 2020-03-28 ENCOUNTER — Telehealth: Payer: Self-pay | Admitting: Gastroenterology

## 2020-03-28 DIAGNOSIS — K7030 Alcoholic cirrhosis of liver without ascites: Secondary | ICD-10-CM

## 2020-03-28 NOTE — Telephone Encounter (Signed)
Please review and advise -pt had TIPS on 03/13/2020 ? Thanks Bre

## 2020-03-28 NOTE — Telephone Encounter (Signed)
Pt requested to schedule a paracentesis.

## 2020-03-29 ENCOUNTER — Other Ambulatory Visit: Payer: Self-pay

## 2020-03-29 DIAGNOSIS — K7030 Alcoholic cirrhosis of liver without ascites: Secondary | ICD-10-CM

## 2020-03-29 NOTE — Telephone Encounter (Signed)
In that case, please schedule for paracentesis while awaiting appt with Dr.McCullough.

## 2020-03-29 NOTE — Addendum Note (Signed)
Addended by: Mohammed Kindle on: 03/29/2020 04:12 PM   Modules accepted: Orders, SmartSet

## 2020-03-29 NOTE — Telephone Encounter (Signed)
We should see fluid accumulating like this after TIPS. Please coordinate to get him back in with Dr. Laurence Ferrari at IR for expedited eval. If clinically significant ascites, may need TIPS interrogation then paracentesis as appropriate.

## 2020-03-29 NOTE — Telephone Encounter (Signed)
Called and spoke with Judithe Modest, TIPS coordinator at [phone], she reports the patient is scheduled on 04/09/2020 at 10:00 am for a TIPS US/follow up; Dr. Frances Nickels cannot see patient any sooner as he is not available until 04/09/2020;   Please advise of next step in leu of this information

## 2020-03-29 NOTE — Telephone Encounter (Signed)
Verbal order received from Dr. Bryan Lemma for patient to receive 25 gm of Albumin, to have no more than 2 liters removed and fluid to be sent for cell count; orders have been placed in Epic;  Patient has been scheduled on 04/04/2020 at 10:00 am and is to arrive at 9:45 am at [facility] for paracentesis;   Left message for patient to call back to the office;

## 2020-04-02 ENCOUNTER — Telehealth: Payer: Self-pay | Admitting: Gastroenterology

## 2020-04-02 NOTE — Telephone Encounter (Signed)
Please advise 

## 2020-04-02 NOTE — Telephone Encounter (Signed)
Pt is scheduled for paracentesis this coming Thursday 4/22 but states that his left leg is very swollen. He would like some advise on what to do in the meantime.

## 2020-04-03 NOTE — Telephone Encounter (Signed)
I have spoke to patient and explained Dr. Vivia Ewing recommendations. Patient voiced understanding.

## 2020-04-03 NOTE — Telephone Encounter (Signed)
If he is having unilateral swelling in one leg, given his clinical history, I recommend heading to the ER or Urgent Care for expedited evaluation for either infection or DVT, with ultrasound and additional lab eval as appropriate.

## 2020-04-04 ENCOUNTER — Telehealth: Payer: Self-pay

## 2020-04-04 ENCOUNTER — Ambulatory Visit (HOSPITAL_COMMUNITY)
Admission: RE | Admit: 2020-04-04 | Discharge: 2020-04-04 | Disposition: A | Discharge status: Home / Self Care | Payer: BC Managed Care – PPO | Source: Ambulatory Visit | Attending: Gastroenterology | Admitting: Gastroenterology

## 2020-04-04 ENCOUNTER — Other Ambulatory Visit: Payer: Self-pay

## 2020-04-04 DIAGNOSIS — K7031 Alcoholic cirrhosis of liver with ascites: Secondary | ICD-10-CM | POA: Insufficient documentation

## 2020-04-04 DIAGNOSIS — K7030 Alcoholic cirrhosis of liver without ascites: Secondary | ICD-10-CM

## 2020-04-04 HISTORY — PX: IR PARACENTESIS: IMG2679

## 2020-04-04 LAB — BODY FLUID CELL COUNT WITH DIFFERENTIAL
Eos, Fluid: 1 %
Lymphs, Fluid: 44 %
Monocyte-Macrophage-Serous Fluid: 50 % (ref 50–90)
Neutrophil Count, Fluid: 5 % (ref 0–25)
Total Nucleated Cell Count, Fluid: 101 cu mm (ref 0–1000)

## 2020-04-04 IMAGING — US IR PARACENTESIS
1 series · 4 of 4 positions shown · non-contrast
Comparison: none

INDICATION: Patient with history of alcoholic cirrhosis, prior TIPS, recurrent
ascites; request received for diagnostic and therapeutic
paracentesis up to 3 liters.

[Series 1: ir (id) (id)/(id)/(id) ir · 4 of 4 slices shown]
[im 1/4]
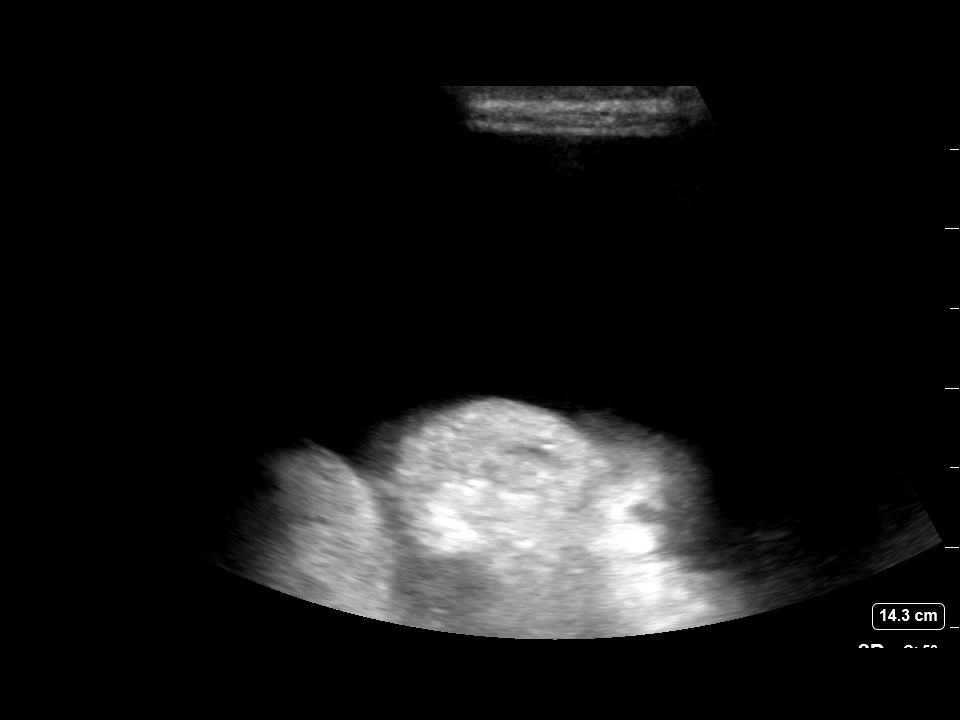
[im 2/4]
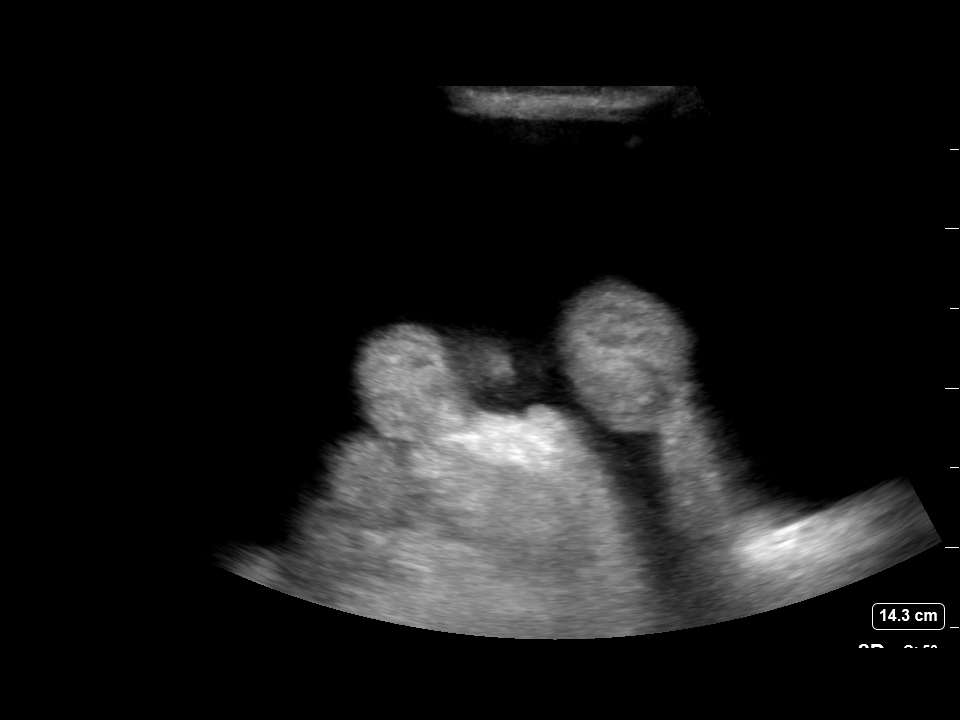
[im 3/4]
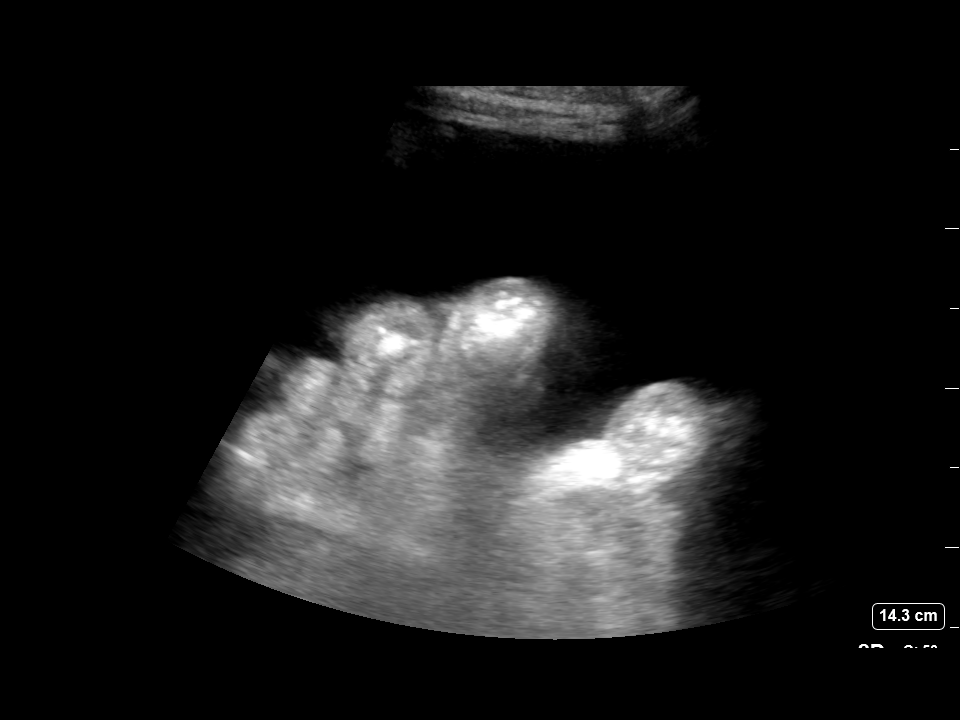
[im 4/4]
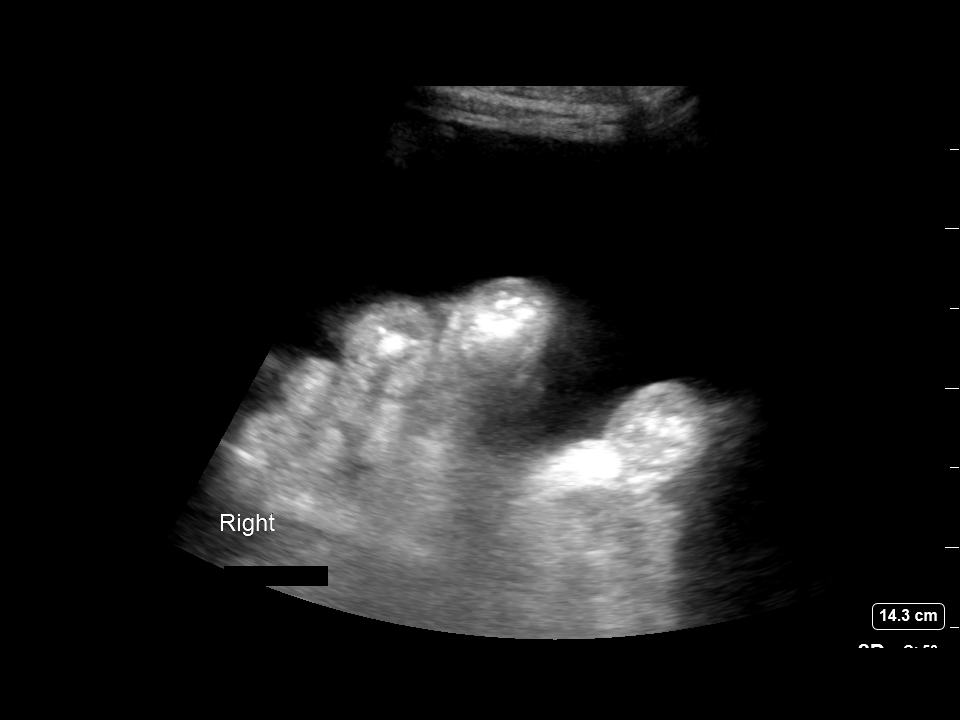

[4 of 4 positions shown; findings below may reference images not displayed]

EXAM:
ULTRASOUND GUIDED DIAGNOSTIC AND THERAPEUTIC PARACENTESIS

MEDICATIONS:
None

COMPLICATIONS:
None immediate.

PROCEDURE:
Informed written consent was obtained from the patient after a
discussion of the risks, benefits and alternatives to treatment. A
timeout was performed prior to the initiation of the procedure.

Initial ultrasound scanning demonstrates a large amount of ascites
within the right lower abdominal quadrant. The right lower abdomen
was prepped and draped in the usual sterile fashion. 1% lidocaine
was used for local anesthesia.

Following this, a 19 gauge, 10-cm, Yueh catheter was introduced. An
ultrasound image was saved for documentation purposes. The
paracentesis was performed. The catheter was removed and a dressing
was applied. The patient tolerated the procedure well without
immediate post procedural complication.
FINDINGS: A total of approximately 3.2 liters of turbid, light yellow fluid
was removed. Samples were sent to the laboratory as requested by the
clinical team.
IMPRESSION: Successful ultrasound-guided diagnostic and therapeutic paracentesis
yielding 3.2 liters of peritoneal fluid.

## 2020-04-04 MED ORDER — LIDOCAINE HCL 1 % IJ SOLN
INTRAMUSCULAR | Status: AC
  Filled 2020-04-04: qty 20

## 2020-04-04 MED ORDER — LIDOCAINE HCL 1 % IJ SOLN
INTRAMUSCULAR | Status: DC | PRN
  Administered 2020-04-04: 10 mL

## 2020-04-04 NOTE — Telephone Encounter (Signed)
Spoke with Lennette Bihari who is a PA at [facility] assisting with patients paracentesis this morning. He took off 2 liters of fluid and questioned if more can be removed. Received verbal order form Dr Bryan Lemma to remove 1 more liter to equal 3 liters total. Patient has an appointment to follow up with Dr Orlinda Blalock next week.

## 2020-04-04 NOTE — Procedures (Signed)
Ultrasound-guided diagnostic and therapeutic paracentesis performed yielding 3.2 liters (maximum ordered) of turbid, light yellow fluid. No immediate complications.  A portion of the fluid was submitted to the lab for preordered studies. EBL < 1 cc.

## 2020-04-05 ENCOUNTER — Telehealth: Payer: Self-pay | Admitting: Gastroenterology

## 2020-04-05 LAB — COMPLETE METABOLIC PANEL WITH GFR
AG Ratio: 0.8 (calc) — ABNORMAL LOW (ref 1.0–2.5)
ALT: 29 U/L (ref 9–46)
AST: 42 U/L — ABNORMAL HIGH (ref 10–35)
Albumin: 3 g/dL — ABNORMAL LOW (ref 3.6–5.1)
Alkaline phosphatase (APISO): 75 U/L (ref 35–144)
BUN/Creatinine Ratio: 19 (calc) (ref 6–22)
BUN: 13 mg/dL (ref 7–25)
CO2: 25 mmol/L (ref 20–32)
Calcium: 8.5 mg/dL — ABNORMAL LOW (ref 8.6–10.3)
Chloride: 109 mmol/L (ref 98–110)
Creat: 0.68 mg/dL — ABNORMAL LOW (ref 0.70–1.33)
GFR, Est African American: 127 mL/min/{1.73_m2} (ref >=60)
GFR, Est Non African American: 110 mL/min/{1.73_m2} (ref >=60)
Globulin: 3.9 g/dL (calc) — ABNORMAL HIGH (ref 1.9–3.7)
Glucose, Bld: 90 mg/dL (ref 65–99)
Potassium: 3.6 mmol/L (ref 3.5–5.3)
Sodium: 143 mmol/L (ref 135–146)
Total Bilirubin: 1.3 mg/dL — ABNORMAL HIGH (ref 0.2–1.2)
Total Protein: 6.9 g/dL (ref 6.1–8.1)

## 2020-04-05 LAB — CBC
HCT: 29.1 % — ABNORMAL LOW (ref 38.5–50.0)
Hemoglobin: 9.4 g/dL — ABNORMAL LOW (ref 13.2–17.1)
MCH: 28.2 pg (ref 27.0–33.0)
MCHC: 32.3 g/dL (ref 32.0–36.0)
MCV: 87.4 fL (ref 80.0–100.0)
MPV: 10.9 fL (ref 7.5–12.5)
Platelets: 135 10*3/uL — ABNORMAL LOW (ref 140–400)
RBC: 3.33 10*6/uL — ABNORMAL LOW (ref 4.20–5.80)
RDW: 17.4 % — ABNORMAL HIGH (ref 11.0–15.0)
WBC: 6.1 10*3/uL (ref 3.8–10.8)

## 2020-04-05 LAB — PROTIME-INR
INR: 1.2 — ABNORMAL HIGH
Prothrombin Time: 12.5 s — ABNORMAL HIGH (ref 9.0–11.5)

## 2020-04-05 LAB — PATHOLOGIST SMEAR REVIEW

## 2020-04-05 NOTE — Telephone Encounter (Signed)
Follow-up call after paracentesis yesterday.  3.2 L removed with improvement in abdominal swelling.  Also with resolution of lower extremity edema after paracentesis.  Per discussion with IR, certainly seems like more fluid was still in the abdomen, but limited amount removed due to previous renal injury.  Has follow-up appointment scheduled with Dr. Laurence Ferrari next week.  Has also restarted Lasix 40 mg/day by Nephrologist, with follow-up in the Nephrology clinic next month.  Can follow-up with me and also with Martin Strong in the [facility] after his appoint with IR.  All questions answered and appreciate callback.

## 2020-04-09 ENCOUNTER — Encounter: Payer: Self-pay | Admitting: *Deleted

## 2020-04-09 ENCOUNTER — Ambulatory Visit
Admission: RE | Admit: 2020-04-09 | Discharge: 2020-04-09 | Disposition: A | Discharge status: Home / Self Care | Payer: BC Managed Care – PPO | Source: Ambulatory Visit | Attending: Physician Assistant | Admitting: Physician Assistant

## 2020-04-09 ENCOUNTER — Telehealth (HOSPITAL_COMMUNITY): Payer: Self-pay

## 2020-04-09 ENCOUNTER — Other Ambulatory Visit (HOSPITAL_COMMUNITY): Payer: Self-pay | Admitting: Interventional Radiology

## 2020-04-09 DIAGNOSIS — R188 Other ascites: Secondary | ICD-10-CM

## 2020-04-09 DIAGNOSIS — K7460 Unspecified cirrhosis of liver: Secondary | ICD-10-CM

## 2020-04-09 DIAGNOSIS — K7031 Alcoholic cirrhosis of liver with ascites: Secondary | ICD-10-CM

## 2020-04-09 HISTORY — PX: IR RADIOLOGIST EVAL & MGMT: IMG5224

## 2020-04-09 IMAGING — US US ART/VEN ABD/PELV/SCROTUM DOPPLER COMPLETE
1 series · 13 of 25 positions shown · non-contrast
Comparison: Tips creation [DATE]

CLINICAL DATA: 53-year-old male with NASH cirrhosis complicated by
recurrent large volume ascites which has been diuretic resistant. He
underwent creation of an elective tips on [DATE] and presents
today for initial follow-up evaluation.

EXAM:
DUPLEX ULTRASOUND OF LIVER AND TIPS SHUNT
TECHNIQUE: Color and duplex Doppler ultrasound was performed to evaluate the
hepatic in-flow and out-flow vessels.

[Series 1: us art/ven abd/pelv/scrotum doppler complete · 0.20mm/px · 13 of 50 slices shown]
[im 1/50]
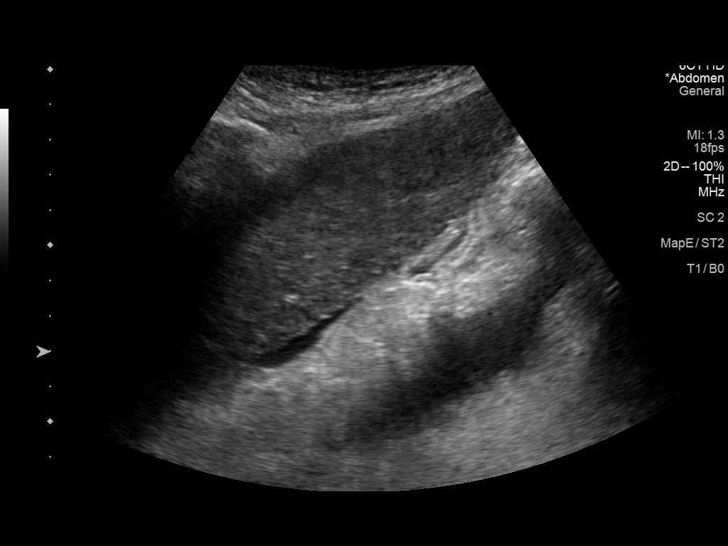
[im 5/50]
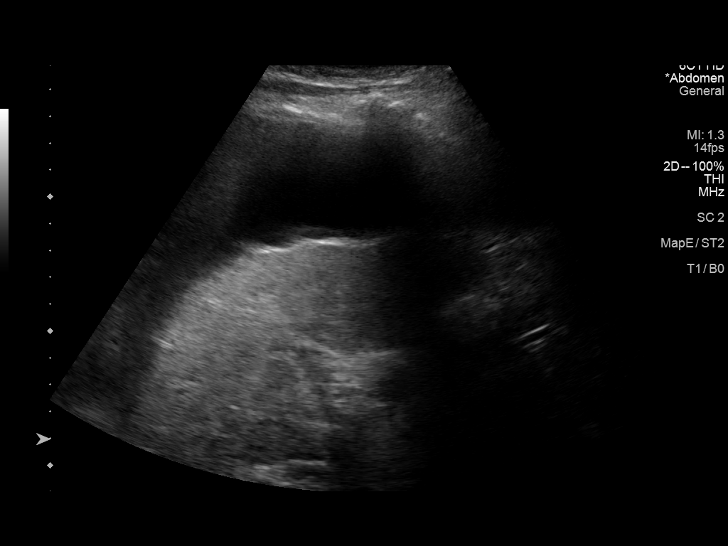
[im 9/50]
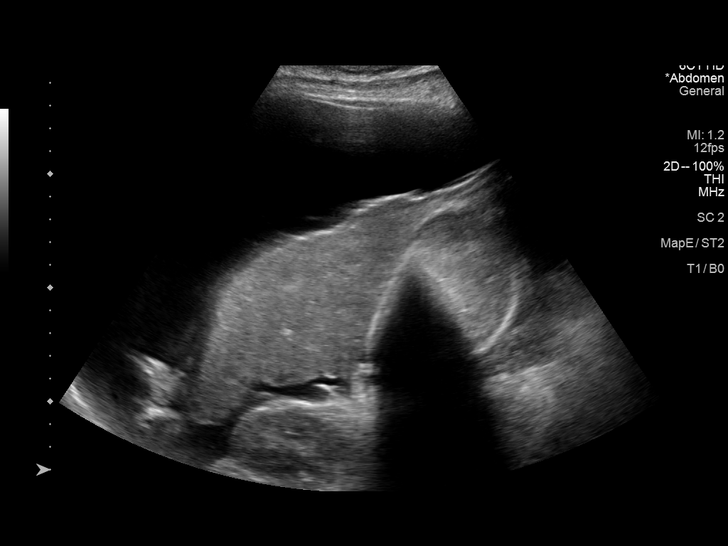
[im 13/50]
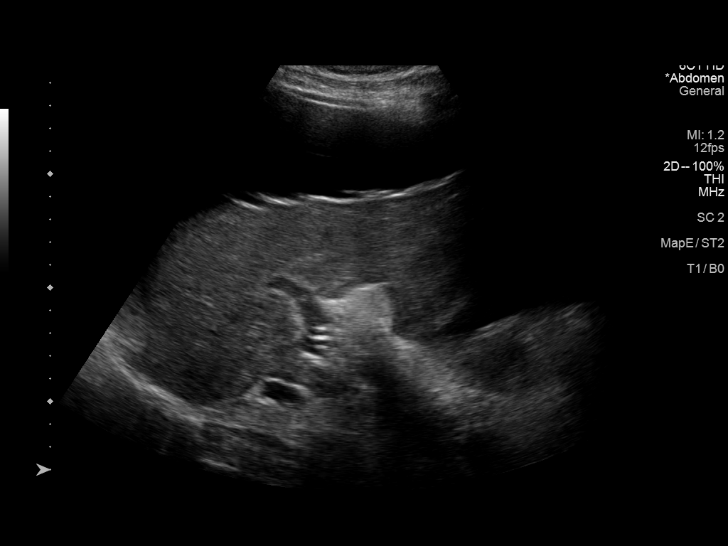
[im 17/50]
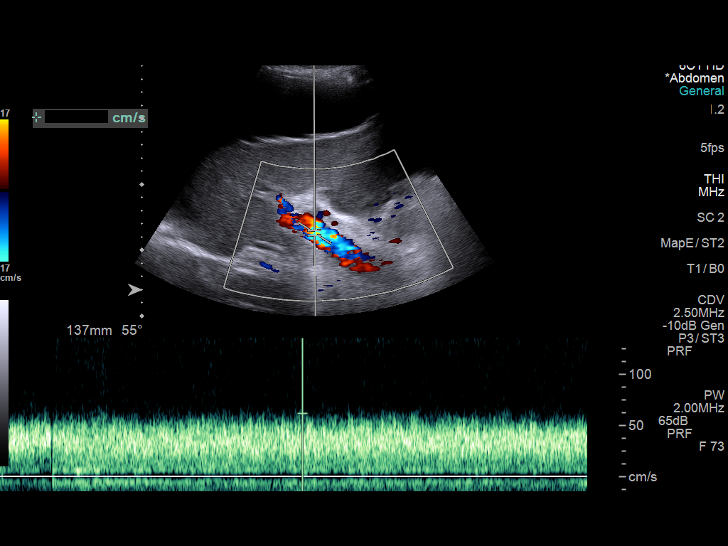
[im 21/50]
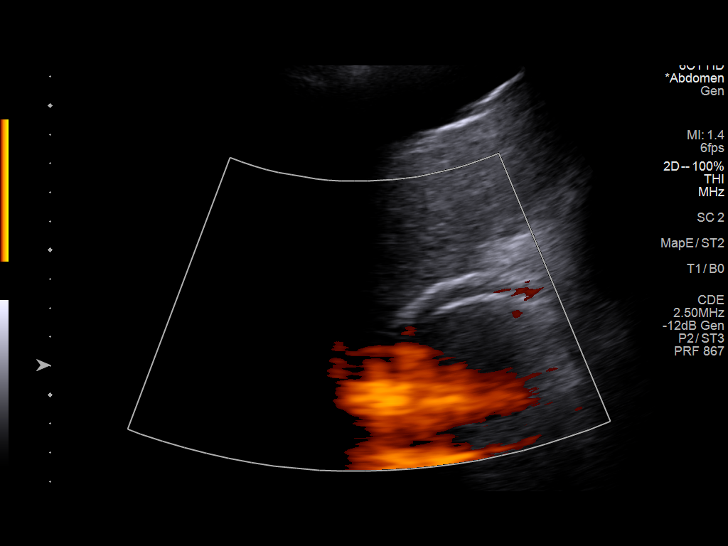
[im 25/50]
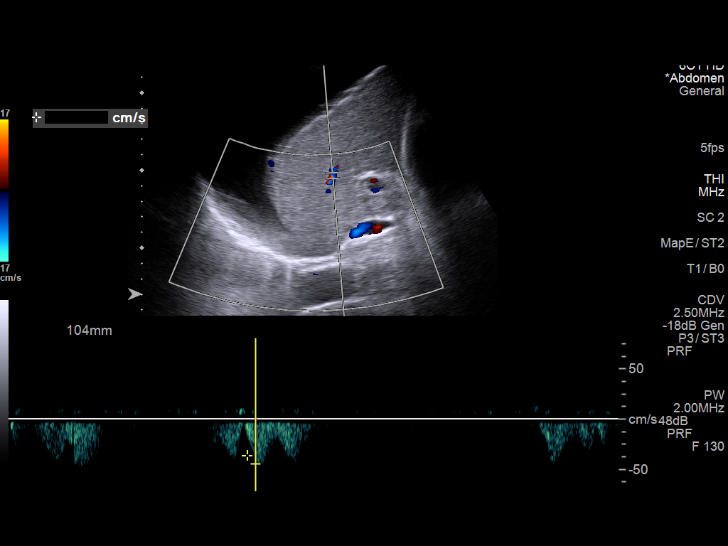
[im 29/50]
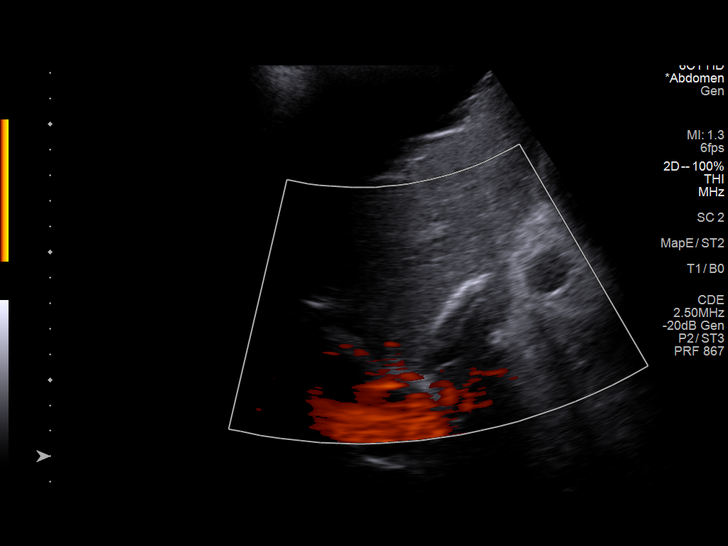
[im 33/50]
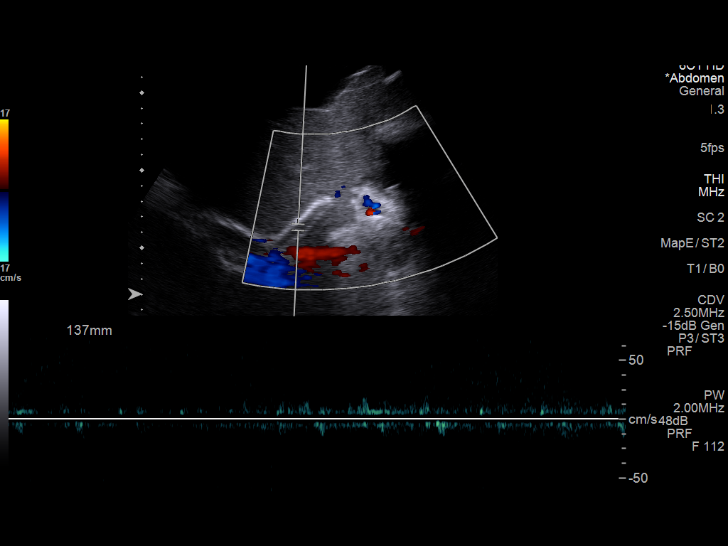
[im 37/50]
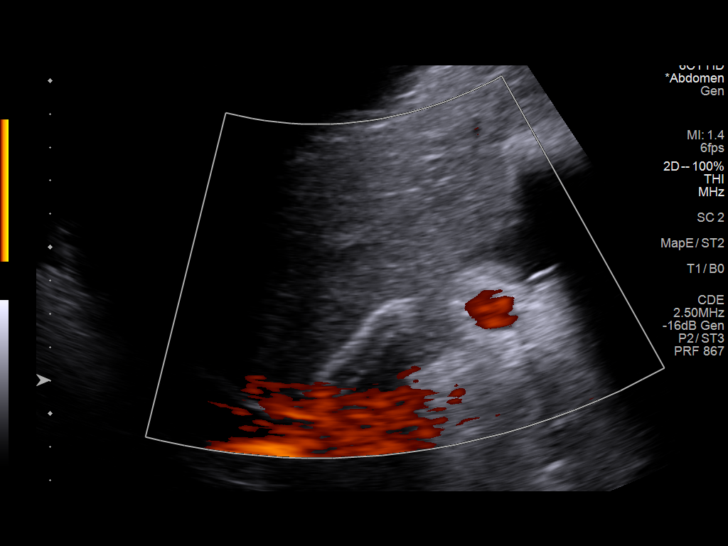
[im 41/50]
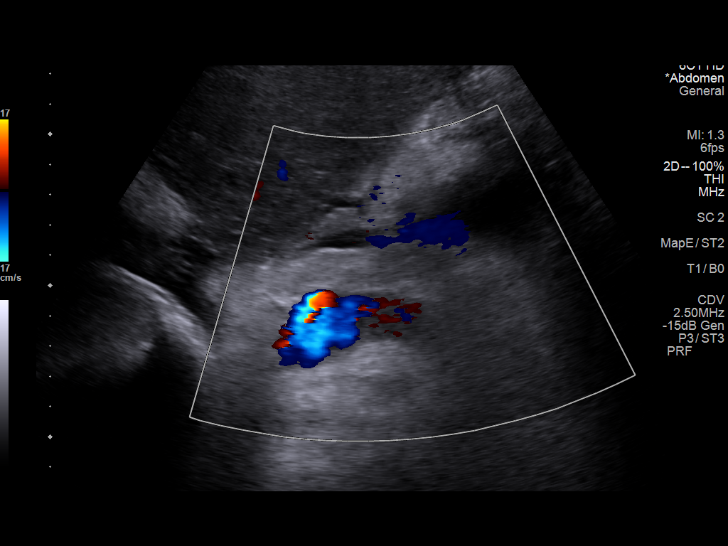
[im 45/50]
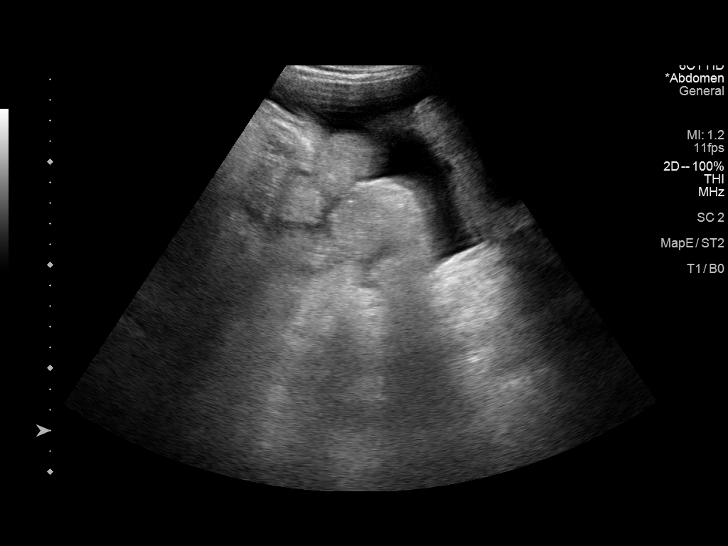
[im 50/50]
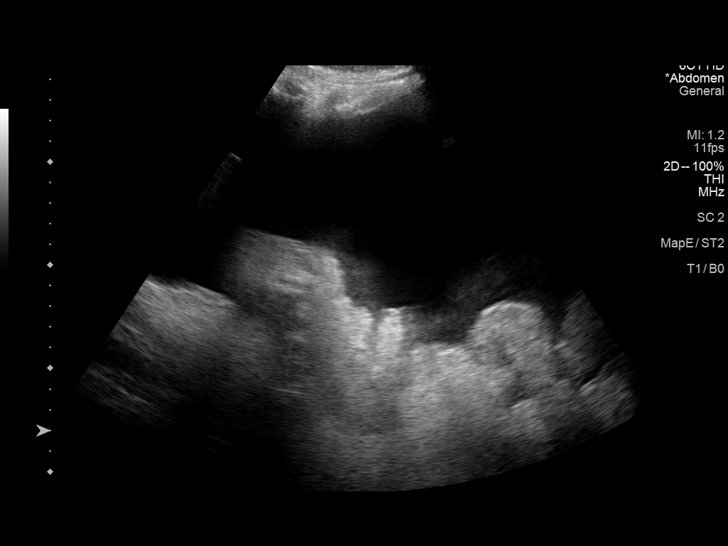

[13 of 25 positions shown; findings below may reference images not displayed]

FINDINGS: Portal Vein Velocities

Main:  73 cm/sec

Right: Not visualized

Left:  27 cm/sec

TIPS Stent Velocities

Proximal: No flow

Mid: No flow

Distal: No flow

IVC: Present and patent with normal respiratory phasicity.

Hepatic Vein Velocities

Right:  45 cm/sec

Mid: Not visualized

Left: Not visualized

Splenic Vein: 34 centimeters/second

Superior Mesenteric Vein: Not visualized

Hepatic Artery: 137 centimeters/second

Ascites: Large volume ascites

Varices: None visualized

Other findings: Small left pleural effusion. The gallbladder is
distended and contains numerous echogenic foci consistent with
cholelithiasis.
IMPRESSION: 1. No flow is visualized within the TIPS stent. Findings are
concerning for occlusion versus very low flow state.
2. Recurrent large volume ascites.
3. Small left pleural effusion.
4. Cholelithiasis.

## 2020-04-09 NOTE — Progress Notes (Signed)
Chief Complaint: Patient was seen in follow-up today for TIPS follow-up at the request of Ardis Rowan  Referring Physician(s): Dr. Gerrit Heck  History of Present Illness: Martin Strong is a 54 y.o. male nonalcoholic steatohepatitis related cirrhosis initially diagnosed in February 8786 and complicated by bleeding esophageal varices in October 2019 which was successfully treated by endoscopic banding and propranolol for dual prophylaxis. Additionally, patient began to develop decompensation and large-volume ascites in October 2020 requiring large-volume paracentesis with removal of 5 L of ascites and initiation of oral diuretic therapy.  He subsequently required additional paracentesis on 10/24/2019 with removal of 7 L of ascites, and again on 11/16/2019 with removal of 7 L of ascites.  He was initially referred to me in January to evaluate for possible TIPS creation.  At that time, we proceeded with standard work-up including echocardiography.  Unfortunately, in February he developed spontaneous bacterial peritonitis and acute kidney injury.  His diuretics were subsequently discontinued. He has since recovered and is on ciprofloxacin 750 mg weekly for SBP prophylaxis.  He underwent elective TIPS creation on 03/12/2020.  Unfortunately, he did require paracentesis again on 04/04/2020.  He remains fatigued which has been his baseline for the last several months.  He denies abdominal pain, nausea, vomiting or new systemic symptoms.  Past Medical History:  Diagnosis Date  . Anemia   . Arthritis   . Cirrhosis ([place])   . Diabetes mellitus without complication ([place])   . Dyspnea   . Fatty liver   . GERD (gastroesophageal reflux disease)   . GI bleed   . Hyperlipidemia   . Hypertension     Past Surgical History:  Procedure Laterality Date  . BIOPSY  02/14/2019   Procedure: BIOPSY;  Surgeon: Lavena Bullion, DO;  Location: [facility];  Service: Gastroenterology;;  .  BIOPSY  07/22/2019   Procedure: BIOPSY;  Surgeon: Lavena Bullion, DO;  Location: [facility];  Service: Gastroenterology;;  . ESOPHAGEAL BANDING  10/07/2018   Procedure: ESOPHAGEAL BANDING;  Surgeon: Lavena Bullion, DO;  Location: [facility];  Service: Gastroenterology;;  . ESOPHAGEAL BANDING N/A 01/03/2019   Procedure: ESOPHAGEAL BANDING;  Surgeon: Lavena Bullion, DO;  Location: [facility];  Service: Gastroenterology;  Laterality: N/A;  . ESOPHAGEAL BANDING  02/14/2019   Procedure: ESOPHAGEAL BANDING;  Surgeon: Lavena Bullion, DO;  Location: [facility];  Service: Gastroenterology;;  . esophageal bands    . ESOPHAGOGASTRODUODENOSCOPY Left 07/22/2019   Procedure: ESOPHAGOGASTRODUODENOSCOPY (EGD) WITH POSSIBLE BANDING;  Surgeon: Lavena Bullion, DO;  Location: [facility];  Service: Gastroenterology;  Laterality: Left;  . ESOPHAGOGASTRODUODENOSCOPY (EGD) WITH PROPOFOL N/A 10/07/2018   Procedure: ESOPHAGOGASTRODUODENOSCOPY (EGD) WITH PROPOFOL;  Surgeon: Lavena Bullion, DO;  Location: [facility];  Service: Gastroenterology;  Laterality: N/A;  . ESOPHAGOGASTRODUODENOSCOPY (EGD) WITH PROPOFOL N/A 01/03/2019   Procedure: ESOPHAGOGASTRODUODENOSCOPY (EGD) WITH PROPOFOL;  Surgeon: Lavena Bullion, DO;  [facility];  Service: Gastroenterology;  Laterality: N/A;  . ESOPHAGOGASTRODUODENOSCOPY (EGD) WITH PROPOFOL N/A 02/14/2019   Procedure: ESOPHAGOGASTRODUODENOSCOPY (EGD) WITH PROPOFOL;  Surgeon: Lavena Bullion, DO;  [facility];  Service: Gastroenterology;  Laterality: N/A;  . IR PARACENTESIS  10/05/2019  . IR PARACENTESIS  10/24/2019  . IR PARACENTESIS  11/16/2019  . IR PARACENTESIS  12/25/2019  . IR PARACENTESIS  01/05/2020  . IR PARACENTESIS  01/23/2020  . IR PARACENTESIS  02/08/2020  . IR PARACENTESIS  02/16/2020  . IR PARACENTESIS  02/28/2020  . IR PARACENTESIS  03/12/2020  .  IR PARACENTESIS  04/04/2020  . IR RADIOLOGIST EVAL & MGMT  12/19/2019  . IR  RADIOLOGIST EVAL & MGMT  02/27/2020  . IR TIPS  03/12/2020  . RADIOLOGY WITH ANESTHESIA N/A 03/12/2020   Procedure: TIPS;  Surgeon: Jacqulynn Cadet, MD;  Location: [facility];  Service: Radiology;  Laterality: N/A;    Allergies: Nadolol  Medications: Prior to Admission medications   Medication Sig Start Date End Date Taking? Authorizing Provider  cholecalciferol (VITAMIN D) 1000 units tablet Take 1,000 Units by mouth 2 (two) times daily.    [provider]  ciprofloxacin (CIPRO) 500 MG tablet Take Cipro 750 mg weekly Patient taking differently: Take 500 mg by mouth every Monday.  02/26/20   Cirigliano, Vito V, DO  dapagliflozin propanediol (FARXIGA) 10 MG TABS tablet Take 10 mg by mouth at bedtime.    [provider]  feeding supplement, ENSURE ENLIVE, (ENSURE ENLIVE) LIQD Take 237 mLs by mouth 2 (two) times daily between meals. 01/28/20   Hosie Poisson, MD  ferrous sulfate (KP FERROUS SULFATE) 325 (65 FE) MG tablet Take 1 tablet (325 mg total) by mouth daily with breakfast. Patient taking differently: Take 325 mg by mouth 2 (two) times daily.  05/30/19   Cirigliano, Vito V, DO  furosemide (LASIX) 20 MG tablet Take 1 tablet (20 mg total) by mouth daily. Patient not taking: Reported on 03/05/2020 12/27/19   Cirigliano, Vito V, DO  furosemide (LASIX) 40 MG tablet Take 40 mg by mouth daily.    [provider]  glipiZIDE (GLUCOTROL) 10 MG tablet Take 10 mg by mouth daily before breakfast.    [provider]  lactulose (CHRONULAC) 10 GM/15ML solution Take 30 mLs (20 g total) by mouth 3 (three) times daily. 03/13/20   Ardis Rowan, PA-C  metFORMIN (GLUCOPHAGE) 1000 MG tablet Take 1,000 mg by mouth 2 (two) times daily with a meal.    [provider]  Multiple Vitamin (MULTIVITAMIN WITH MINERALS) TABS tablet Take 1 tablet by mouth at bedtime.    [provider]  propranolol (INDERAL) 40 MG tablet Take 0.5 tablets (20 mg total) by mouth 2 (two) times  daily. 01/27/20   Hosie Poisson, MD     Family History  Problem Relation Age of Onset  . Diabetes Other   . Esophageal cancer Father   . Colon cancer Neg Hx   . Rectal cancer Neg Hx   . Stomach cancer Neg Hx     Social History   Socioeconomic History  . Marital status: Married    Spouse name: Not on file  . Number of children: 2  . Years of education: Not on file  . Highest education level: Not on file  Occupational History  . Not on file  Tobacco Use  . Smoking status: Never Smoker  . Smokeless tobacco: Never Used  Substance and Sexual Activity  . Alcohol use: Not Currently  . Drug use: Never  . Sexual activity: Not on file  Other Topics Concern  . Not on file  Social History Narrative  . Not on file   Social Determinants of Health   Financial Resource Strain:   . Difficulty of Paying Living Expenses:   Food Insecurity:   . Worried About Charity fundraiser in the Last Year:   . Arboriculturist in the Last Year:   Transportation Needs:   . Film/video editor (Medical):   Marland Kitchen Lack of Transportation (Non-Medical):   Physical Activity:   .  Days of Exercise per Week:   . Minutes of Exercise per Session:   Stress:   . Feeling of Stress :   Social Connections:   . Frequency of Communication with Friends and Family:   . Frequency of Social Gatherings with Friends and Family:   . Attends Religious Services:   . Active Member of Clubs or Organizations:   . Attends Archivist Meetings:   Marland Kitchen Marital Status:     Review of Systems: A 12 point ROS discussed and pertinent positives are indicated in the HPI above.  All other systems are negative.  Review of Systems  Vital Signs: There were no vitals taken for this visit.  Physical Exam Constitutional:      General: He is not in acute distress.    Appearance: Normal appearance.  HENT:     Head: Normocephalic and atraumatic.  Eyes:     General: No scleral icterus. Cardiovascular:     Rate and Rhythm:  Normal rate.  Pulmonary:     Effort: Pulmonary effort is normal.  Abdominal:     General: There is distension.     Tenderness: There is no abdominal tenderness.  Skin:    General: Skin is warm and dry.  Neurological:     Mental Status: He is alert and oriented to person, place, and time.  Psychiatric:        Mood and Affect: Mood normal.        Behavior: Behavior normal.      Imaging: IR Tips  Result Date: 03/12/2020 CLINICAL DATA:  54 year old male with non alcoholic steatohepatitis cirrhosis complicated by recurrent large volume ascites. He has been diuretic insensitive and has also suffered a recent episode of acute kidney injury. Therefore, he presents for placement of an elective tips. EXAM: TIPS creation MEDICATIONS: As antibiotic prophylaxis, 2 g Rocephin was ordered pre-procedure and administered intravenously within one hour of incision. ANESTHESIA/SEDATION: General - as administered by the Anesthesia department CONTRAST:  80 mL Omnipaque 300 FLUOROSCOPY TIME:  Fluoroscopy Time: 13 minutes 12 seconds (419 mGy). COMPLICATIONS: None immediate. PROCEDURE: Informed written consent was obtained from the patient after a thorough discussion of the procedural risks, benefits and alternatives. All questions were addressed. Maximal Sterile Barrier Technique was utilized including caps, mask, sterile gowns, sterile gloves, sterile drape, hand hygiene and skin antiseptic. A timeout was performed prior to the initiation of the procedure. Ultrasound was used to interrogate the liver. Under real-time ultrasound guidance, a branch of the portal vein was punctured with a 21 gauge Accustick needle. A microwire was advanced into the main portal vein. The 3 [place] inner portion of the Accustick sheath was then advanced into the portal vein. Transhepatic portal venogram was then performed. The microwire was positioned at the confluence of the left and right hepatic veins. The right internal jugular vein was  interrogated with ultrasound and found to be widely patent. An image was obtained and stored for the medical record. Local anesthesia was attained by infiltration with 1% lidocaine. A small dermatotomy was made. Under real-time sonographic guidance, the vessel was punctured with a 21 gauge micropuncture needle. Using standard technique, the initial micro needle was exchanged over a 0.018 micro wire for a transitional 4 [place] micro sheath. The micro sheath was then exchanged over a 0.035 wire for a 10 French fascial dilator and the skin tract was dilated. A tips sheath was then advanced over the wire and into the right atrium. Using an MPA catheter, the middle  hepatic vein was catheterized. A hepatic venogram was performed confirming that this is indeed the middle hepatic vein. Tips sheath was advanced to the middle hepatic vein. The colo pinto needle was introduced. Using AP and lateral localization techniques, the right main portal vein was punctured using a single pass. A glidewire was advanced into the main portal vein. The colo pinto needle was removed. A 5 French glide catheter was advanced over the [employer]. The Glidewire was then exchanged for an Amplatz wire. A marker pigtail catheter was advanced into the splenic vein. Portal venography was performed. Small varices are present arising from the posterior gastric vein. And 8 cm covered via tore stent graft was selected. The Amplatz wire was reintroduced in the pigtail catheter removed. The transhepatic tract was pre dilated to 6 mm utilizing a 6 x 40 mm mustang balloon. The tips sheath was advanced through the liver and into the main portal vein. The via tore stent was then positioned and deployed. The via tore stent was post dilated to 8 mm using an 8 x 40 mm mustang balloon. The pigtail catheter was reintroduced over the Amplatz wire. Portal venography demonstrates a widely patent middle hepatic to right portal tips. No evidence of extravasation. The  varices arising from the posterior gastric vein are no longer visualized. The sheath was removed. Hemostasis was attained by manual pressure. The transhepatic 3 French sheath was also removed. There was no evidence of bleeding. IMPRESSION: Successful creation of a middle hepatic to right portal venous tips which was post dilated to 8 mm. Patient will be admitted for overnight observation. Outpatient follow-up with a TIPS ultrasound in 1 month. Signed, Criselda Peaches, MD, [facility] Radiology Electronically Signed   By: Jacqulynn Cadet M.D.   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 He underwent creation of an elective tips on 03/12/2020 and presents today for initial follow-up evaluation. EXAM: DUPLEX ULTRASOUND OF LIVER AND TIPS SHUNT TECHNIQUE: Color and duplex Doppler ultrasound was performed to evaluate the hepatic in-flow and out-flow vessels. COMPARISON:  Tips creation 03/12/2020 FINDINGS: Portal Vein Velocities Main:  73 cm/sec Right: Not visualized Left:  27 cm/sec TIPS Stent Velocities Proximal: No flow Mid: No flow Distal: No flow IVC: Present and patent with normal respiratory phasicity. Hepatic Vein Velocities Right:  45 cm/sec Mid: Not visualized Left: Not visualized Splenic Vein: 34 centimeters/second Superior Mesenteric Vein: Not visualized Hepatic Artery: 137 centimeters/second Ascites: Large volume ascites Varices: None visualized Other findings: Small left pleural effusion. The gallbladder is distended and contains numerous echogenic foci consistent with cholelithiasis. IMPRESSION: 1. No flow is visualized within the TIPS stent. Findings are concerning for occlusion versus very low flow state. 2. Recurrent large volume ascites. 3. Small left pleural  effusion. 4. Cholelithiasis. Signed, Criselda Peaches, MD, [facility] Radiology Electronically Signed   By: Jacqulynn Cadet M.D.   On: 04/09/2020 11:05   IR Paracentesis  Result Date: 04/04/2020 INDICATION: Patient with history of alcoholic cirrhosis, prior TIPS, recurrent ascites; request received for diagnostic and therapeutic paracentesis up to 3 liters. EXAM: ULTRASOUND GUIDED DIAGNOSTIC AND THERAPEUTIC PARACENTESIS MEDICATIONS: None COMPLICATIONS: None immediate. PROCEDURE: Informed written consent was obtained from the patient after a discussion of the risks, benefits and alternatives to treatment. A timeout was performed prior to  the initiation of the procedure. Initial ultrasound scanning demonstrates a large amount of ascites within the right lower abdominal quadrant. The right lower abdomen was prepped and draped in the usual sterile fashion. 1% lidocaine was used for local anesthesia. Following this, a 19 gauge, 10-cm, Yueh catheter was introduced. An ultrasound image was saved for documentation purposes. The paracentesis was performed. The catheter was removed and a dressing was applied. The patient tolerated the procedure well without immediate post procedural complication. FINDINGS: A total of approximately 3.2 liters of turbid, light yellow fluid was removed. Samples were sent to the laboratory as requested by the clinical team. IMPRESSION: Successful ultrasound-guided diagnostic and therapeutic paracentesis yielding 3.2 liters of peritoneal fluid. Read by: Rowe Robert, PA-C Electronically Signed   By: Corrie Mckusick D.O.   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 He presents for paracentesis and elective TIPS. EXAM: ULTRASOUND GUIDED  PARACENTESIS MEDICATIONS: None. COMPLICATIONS: None immediate. PROCEDURE: Informed  written consent was obtained from the patient after a discussion of the risks, benefits and alternatives to treatment. A timeout was performed prior to the initiation of the procedure. Initial ultrasound scanning demonstrates a large amount of ascites within the right lower abdominal quadrant. The right lower abdomen was prepped and draped in the usual sterile fashion. 1% lidocaine with epinephrine was used for local anesthesia. Following this, a 6 Fr Safe-T-Centesis catheter was introduced. An ultrasound image was saved for documentation purposes. The paracentesis was performed. The catheter was removed and a dressing was applied. The patient tolerated the procedure well without immediate post procedural complication. FINDINGS: A total of approximately 15.5 L of ascitic fluid was removed. IMPRESSION: Successful ultrasound-guided paracentesis yielding 15.5 liters of peritoneal fluid. Electronically Signed   By: Jacqulynn Cadet M.D.   On: 03/12/2020 20:29    Labs:  CBC: Recent Labs    03/07/20 1155 03/12/20 0730 03/13/20 0143 04/04/20 1109  WBC 7.2 5.7 4.7 6.1  HGB 8.7* 8.4* 7.1* 9.4*  HCT 29.1* 27.4* 22.4* 29.1*  PLT 155 166 106* 135*    COAGS: Recent Labs    01/23/20 1938 01/23/20 1938 02/08/20 1132 03/12/20 0730 03/13/20 0143 04/04/20 1109  INR 1.5*   < > 1.3* 1.4* 1.6* 1.2*  APTT 43*  --   --   --   --   --    < > = values in this interval not displayed.    BMP: Recent Labs    03/07/20 1155 03/12/20 0730 03/13/20 0143 04/04/20 1109  NA 136 138 137 143  K 3.6 3.4* 3.4* 3.6  CL 100 98 101 109  CO2 22 27 27 25   GLUCOSE 92 120* 188* 90  BUN 23* 18 18 13   CALCIUM 8.7* 8.9 8.3* 8.5*  CREATININE 1.04 1.04 1.04 0.68*  GFRNONAA >60 >60 >60 110  GFRAA >60 >60 >60 127    LIVER FUNCTION TESTS: Recent Labs    02/08/20 1132 02/08/20 1132 03/07/20 1155 03/12/20 0730 03/13/20 0143 04/04/20 1109  BILITOT 0.7   < > 1.4* 1.2 0.9 1.3*  AST 27   < > 41 36 37 42*  ALT 22    < > 27 29 26 29   ALKPHOS 93  --  70 69 50  --   PROT 7.4   < > 8.1 7.7 5.9* 6.9  ALBUMIN 3.6  --  3.2* 3.2* 2.9*  --    < > =  values in this interval not displayed.    TUMOR MARKERS: No results for input(s): AFPTM, CEA, CA199, CHROMGRNA in the last 8760 hours.  Assessment and Plan:  It appears that Martin Strong is recently placed TIPS stent may be occluded.  He is continuing to have rapid reaccumulation of his highly symptomatic ascites.  1.)  Schedule as soon as possible for TIPS revision under moderate anesthesia.  We will likely perform paracentesis at the same time.   Electronically Signed: Jacqulynn Cadet 04/09/2020, 11:16 AM   I spent a total of  15 Minutes in face to face in clinical consultation, greater than 50% of which was counseling/coordinating care for cirrhosis, ascites.

## 2020-04-09 NOTE — Telephone Encounter (Signed)
Called to schedule TIPS revision, no answer, left vm. AW

## 2020-04-10 ENCOUNTER — Other Ambulatory Visit: Payer: Self-pay | Admitting: Radiology

## 2020-04-10 ENCOUNTER — Other Ambulatory Visit: Payer: Self-pay | Admitting: Physician Assistant

## 2020-04-11 ENCOUNTER — Other Ambulatory Visit (HOSPITAL_COMMUNITY): Payer: Self-pay | Admitting: Interventional Radiology

## 2020-04-11 ENCOUNTER — Ambulatory Visit (HOSPITAL_COMMUNITY)
Admission: RE | Admit: 2020-04-11 | Discharge: 2020-04-11 | Disposition: A | Discharge status: Home / Self Care | Payer: BC Managed Care – PPO | Source: Ambulatory Visit | Attending: Interventional Radiology | Admitting: Interventional Radiology

## 2020-04-11 ENCOUNTER — Other Ambulatory Visit: Payer: Self-pay

## 2020-04-11 DIAGNOSIS — Z79899 Other long term (current) drug therapy: Secondary | ICD-10-CM | POA: Diagnosis not present

## 2020-04-11 DIAGNOSIS — E785 Hyperlipidemia, unspecified: Secondary | ICD-10-CM | POA: Insufficient documentation

## 2020-04-11 DIAGNOSIS — Z7984 Long term (current) use of oral hypoglycemic drugs: Secondary | ICD-10-CM | POA: Diagnosis not present

## 2020-04-11 DIAGNOSIS — I10 Essential (primary) hypertension: Secondary | ICD-10-CM | POA: Insufficient documentation

## 2020-04-11 DIAGNOSIS — K7031 Alcoholic cirrhosis of liver with ascites: Secondary | ICD-10-CM

## 2020-04-11 DIAGNOSIS — K219 Gastro-esophageal reflux disease without esophagitis: Secondary | ICD-10-CM | POA: Insufficient documentation

## 2020-04-11 DIAGNOSIS — K7581 Nonalcoholic steatohepatitis (NASH): Secondary | ICD-10-CM | POA: Diagnosis not present

## 2020-04-11 DIAGNOSIS — E119 Type 2 diabetes mellitus without complications: Secondary | ICD-10-CM | POA: Diagnosis not present

## 2020-04-11 DIAGNOSIS — D649 Anemia, unspecified: Secondary | ICD-10-CM | POA: Insufficient documentation

## 2020-04-11 HISTORY — PX: IR TIPS REVISION MOD SED: IMG2296

## 2020-04-11 HISTORY — PX: IR PARACENTESIS: IMG2679

## 2020-04-11 LAB — COMPREHENSIVE METABOLIC PANEL
ALT: 36 U/L (ref 0–44)
AST: 44 U/L — ABNORMAL HIGH (ref 15–41)
Albumin: 2.7 g/dL — ABNORMAL LOW (ref 3.5–5.0)
Alkaline Phosphatase: 73 U/L (ref 38–126)
Anion gap: 11 (ref 5–15)
BUN: 11 mg/dL (ref 6–20)
CO2: 26 mmol/L (ref 22–32)
Calcium: 8.5 mg/dL — ABNORMAL LOW (ref 8.9–10.3)
Chloride: 104 mmol/L (ref 98–111)
Creatinine, Ser: 0.88 mg/dL (ref 0.61–1.24)
GFR calc Af Amer: >60 mL/min (ref >60)
GFR calc non Af Amer: >60 mL/min (ref >60)
Glucose, Bld: 147 mg/dL — ABNORMAL HIGH (ref 70–99)
Potassium: 3.3 mmol/L — ABNORMAL LOW (ref 3.5–5.1)
Sodium: 141 mmol/L (ref 135–145)
Total Bilirubin: 1.3 mg/dL — ABNORMAL HIGH (ref 0.3–1.2)
Total Protein: 7.1 g/dL (ref 6.5–8.1)

## 2020-04-11 LAB — CBC WITH DIFFERENTIAL/PLATELET
Abs Immature Granulocytes: 0.02 10*3/uL (ref 0.00–0.07)
Basophils Absolute: 0.1 10*3/uL (ref 0.0–0.1)
Basophils Relative: 1 %
Eosinophils Absolute: 0.3 10*3/uL (ref 0.0–0.5)
Eosinophils Relative: 5 %
HCT: 30.1 % — ABNORMAL LOW (ref 39.0–52.0)
Hemoglobin: 9.5 g/dL — ABNORMAL LOW (ref 13.0–17.0)
Immature Granulocytes: 0 %
Lymphocytes Relative: 13 %
Lymphs Abs: 0.8 10*3/uL (ref 0.7–4.0)
MCH: 28.5 pg (ref 26.0–34.0)
MCHC: 31.6 g/dL (ref 30.0–36.0)
MCV: 90.4 fL (ref 80.0–100.0)
Monocytes Absolute: 0.6 10*3/uL (ref 0.1–1.0)
Monocytes Relative: 10 %
Neutro Abs: 4.5 10*3/uL (ref 1.7–7.7)
Neutrophils Relative %: 71 %
Platelets: 154 10*3/uL (ref 150–400)
RBC: 3.33 MIL/uL — ABNORMAL LOW (ref 4.22–5.81)
RDW: 17.8 % — ABNORMAL HIGH (ref 11.5–15.5)
WBC: 6.3 10*3/uL (ref 4.0–10.5)
nRBC: 0 % (ref 0.0–0.2)

## 2020-04-11 LAB — PROTIME-INR
INR: 1.3 — ABNORMAL HIGH (ref 0.8–1.2)
Prothrombin Time: 16 seconds — ABNORMAL HIGH (ref 11.4–15.2)

## 2020-04-11 LAB — GLUCOSE, CAPILLARY
Glucose-Capillary: 128 mg/dL — ABNORMAL HIGH (ref 70–99)
Glucose-Capillary: 91 mg/dL (ref 70–99)

## 2020-04-11 IMAGING — US IR PARACENTESIS
9 of 11 series · 14 of 24 positions shown · non-contrast
Comparison: none

INDICATION: Patient with history of non-alcoholic steatohepatitis related
cirrhosis with ascites. IR asked to perform therapeutic paracentesis
prior to scheduled TIPS revision today in IR.

[Series 1: body 4 care · 2 of 14 slices shown (1 of 6)]
[im 1/14]
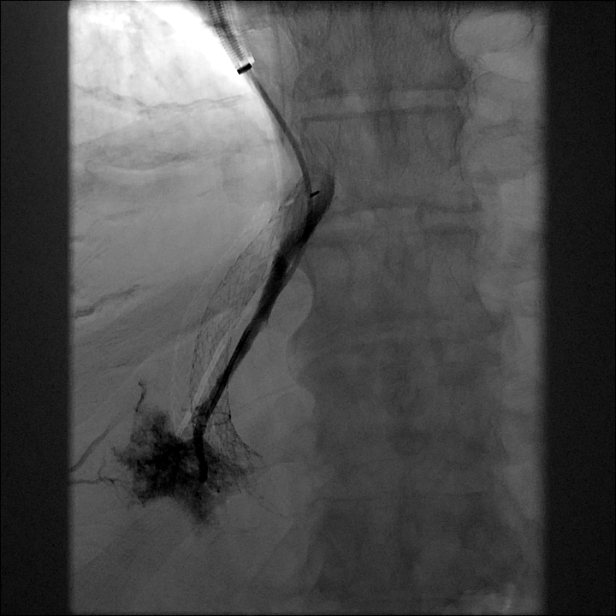
[im 14/14]
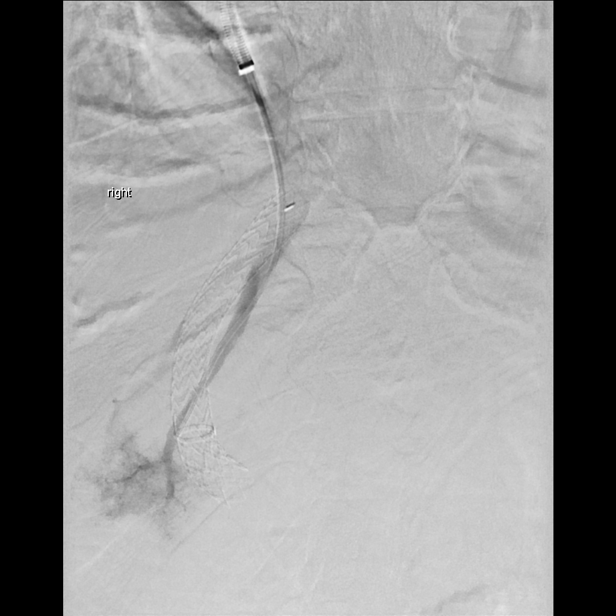

[Series 2: body 4 care · 2 of 14 slices shown (2 of 6)]
[im 1/14]
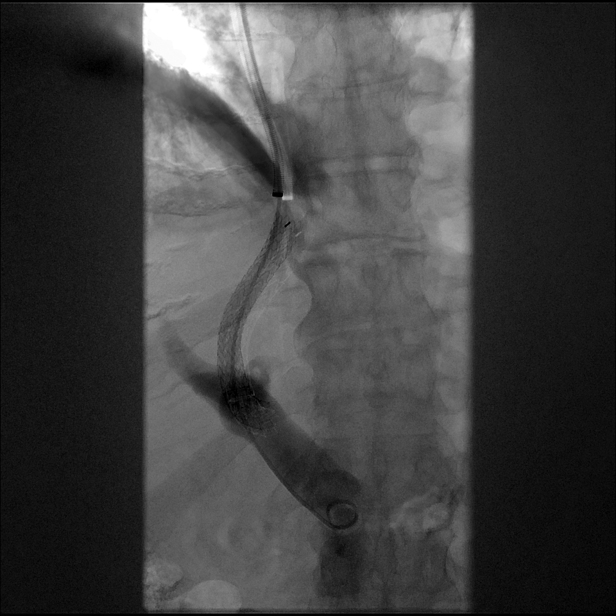
[im 14/14]
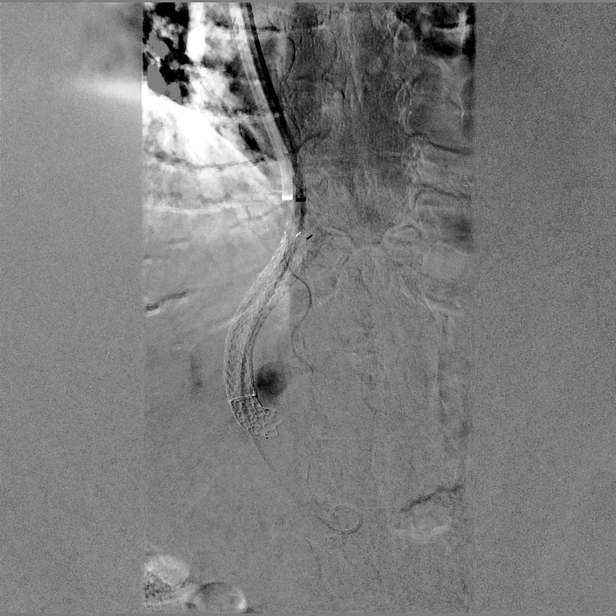

[Series 3: body 4 care · 2 of 14 slices shown (3 of 6)]
[im 1/14]
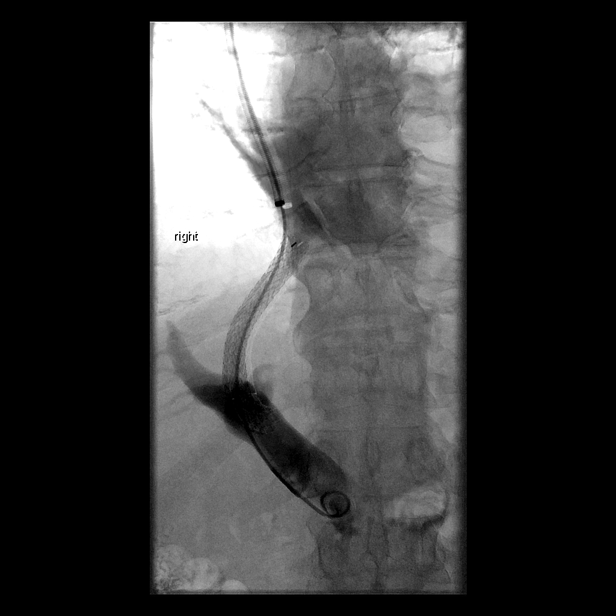
[im 14/14]
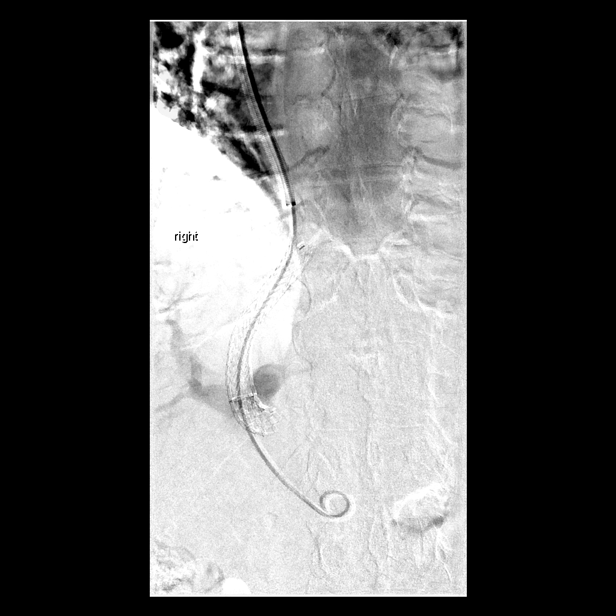

[Series 5: fl (-) angio · 1 of 1 slices shown (1 of 3)]
[im 1/1]
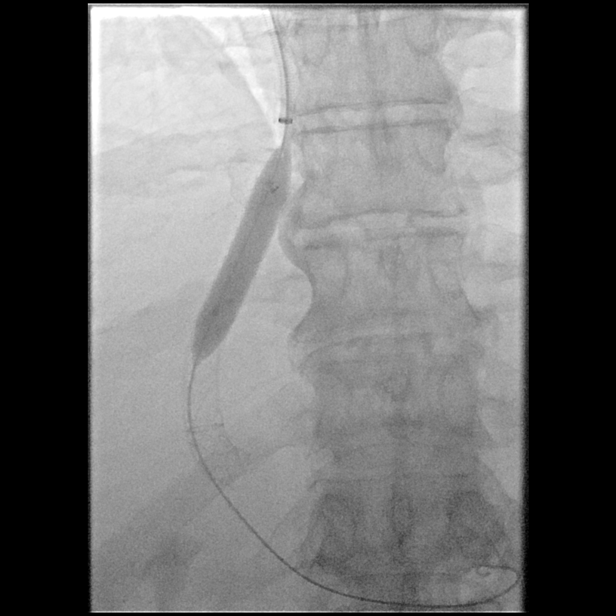

[Series 7: body 4 care · 2 of 16 slices shown (4 of 6)]
[im 1/16]
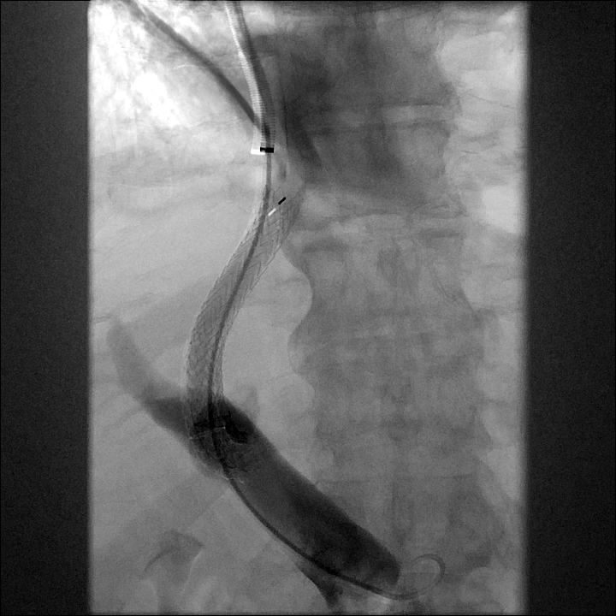
[im 11/16]
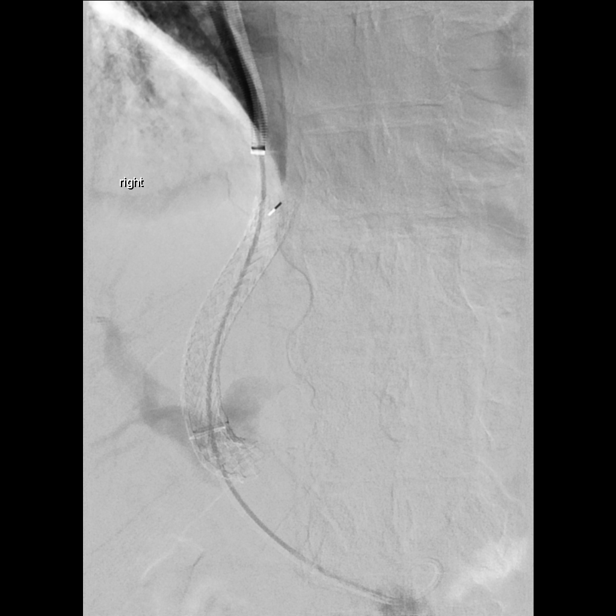

[Series 8: body 4 care · 1 of 8 slices shown (5 of 6)]
[im 1/8]
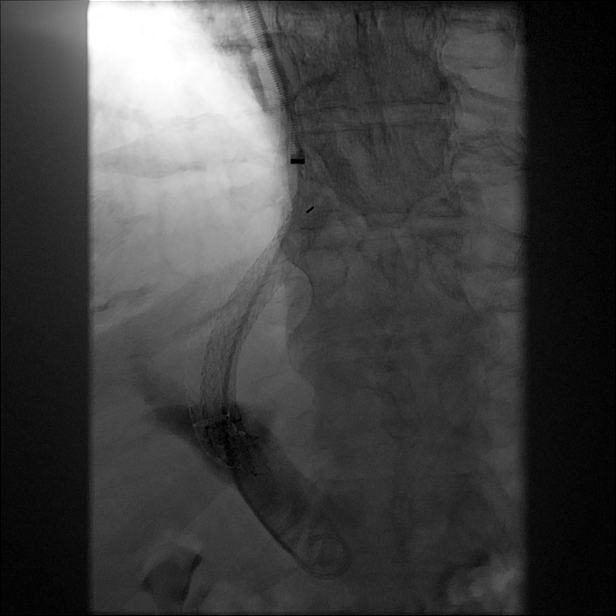

[Series 10: fl (-) angio · 1 of 1 slices shown (2 of 3)]
[im 1/1]
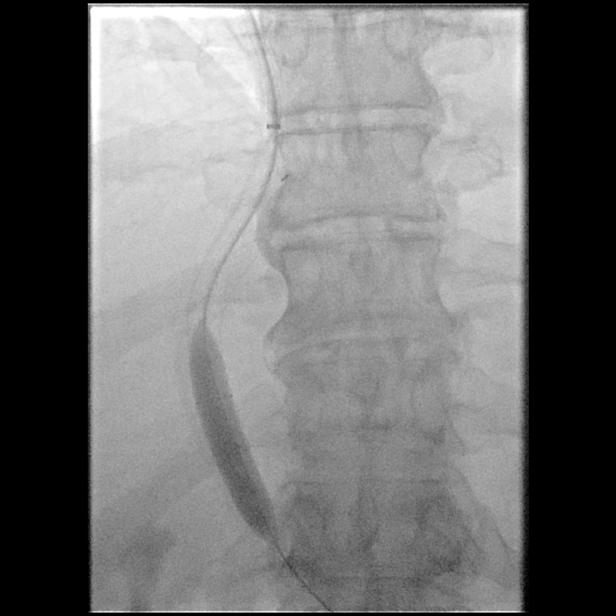

[Series 11: fl (-) angio · 1 of 1 slices shown (3 of 3)]
[im 1/1]
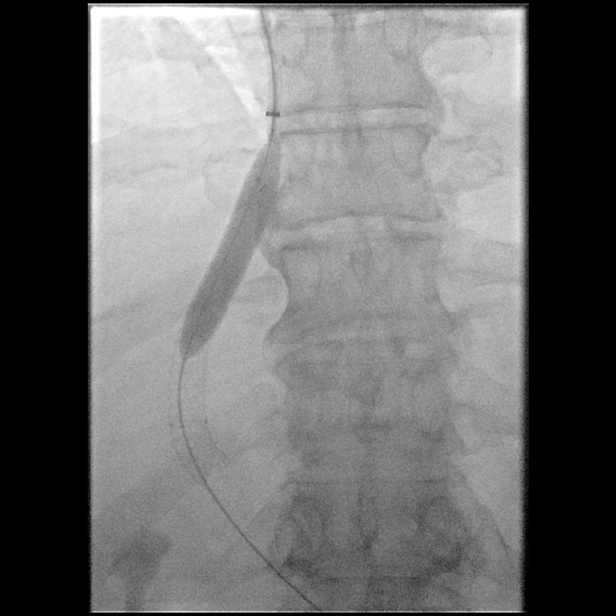

[Series 12: body 4 care · 2 of 14 slices shown (6 of 6)]
[im 5/14]
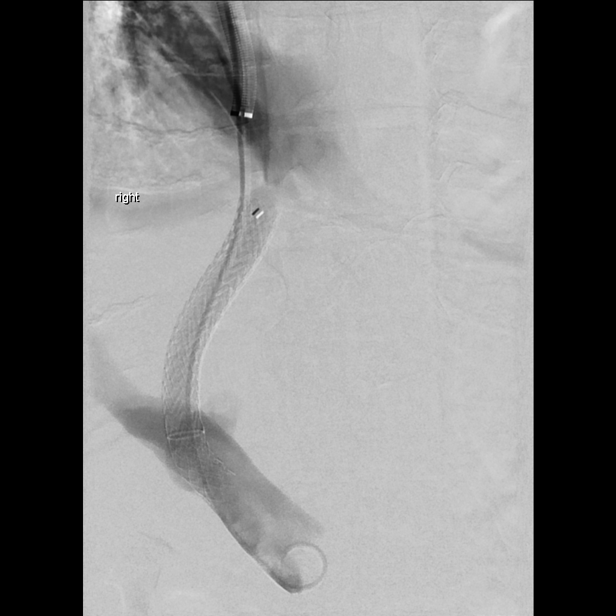
[im 14/14]
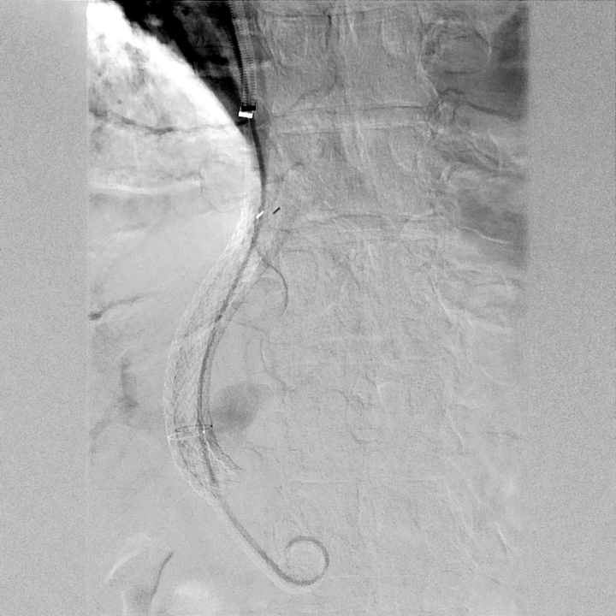

[14 of 24 positions shown; findings below may reference images not displayed]

EXAM:
ULTRASOUND GUIDED left PARACENTESIS

MEDICATIONS:
1% lidocaine, 10 ml

COMPLICATIONS:
None immediate.

PROCEDURE:
Informed written consent was obtained from the patient after a
discussion of the risks, benefits and alternatives to treatment. A
timeout was performed prior to the initiation of the procedure.

Initial ultrasound scanning demonstrates a large amount of ascites
within the left lower abdominal quadrant. The left lower abdomen was
prepped and draped in the usual sterile fashion. 1% lidocaine was
used for local anesthesia.

Following this, a 19 gauge, 7-cm, Yueh catheter was introduced. An
ultrasound image was saved for documentation purposes. The
paracentesis was performed. The catheter was removed and a dressing
was applied. The patient tolerated the procedure well without
immediate post procedural complication.
FINDINGS: A total of approximately 4 of hazy yellow fluid was removed.
IMPRESSION: Successful ultrasound-guided paracentesis yielding 4 liters of
peritoneal fluid.

Read by: HMA, NP

## 2020-04-11 IMAGING — US IR TIPS REVISION
1 series · 1 of 1 positions shown · non-contrast
Comparison: none

CLINICAL DATA: 53-year-old male with a history of cryptogenic
cirrhosis complicated by recurrent large volume ascites.

[Series 1: ir tips revision · 1 of 1 slices shown]
[im 1/1]
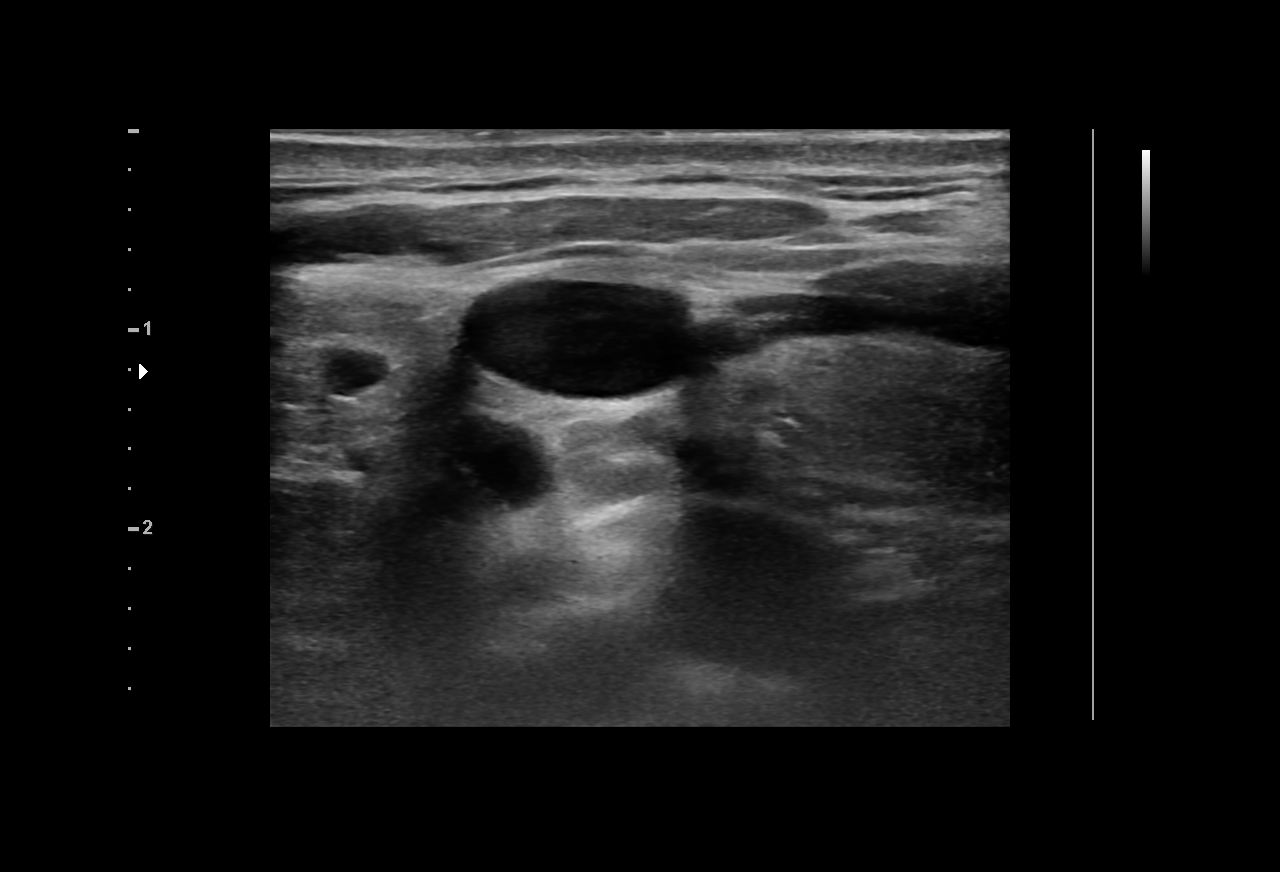

[1 of 1 positions shown; findings below may reference images not displayed]

EXAM:
Tips revision

MEDICATIONS:
2 g Ancef

ANESTHESIA/SEDATION:
1 mg Versed 50 mcg fentanyl administered intravenously.

Moderate Sedation Time:  44 minutes

The patient was continuously monitored during the procedure by the
interventional radiology nurse under my direct supervision.

CONTRAST:  70 mL Isovue 300

FLUOROSCOPY TIME:  Fluoroscopy Time: 8 minutes 18 seconds (586 mGy).

COMPLICATIONS:
None immediate.

PROCEDURE:
Informed written consent was obtained from the patient after a
thorough discussion of the procedural risks, benefits and
alternatives. All questions were addressed. Maximal Sterile Barrier
Technique was utilized including caps, mask, sterile gowns, sterile
gloves, sterile drape, hand hygiene and skin antiseptic. A timeout
was performed prior to the initiation of the procedure.

The right internal jugular vein was interrogated with ultrasound and
found to be widely patent. An image was obtained and stored for the
medical record. Local anesthesia was attained by infiltration with
1% lidocaine. A small dermatotomy was made. Under real-time
sonographic guidance, the vessel was punctured with a 21 gauge
micropuncture needle. Using standard technique, the initial micro
needle was exchanged over a 0.018 micro wire for a transitional 4
French micro sheath. The micro sheath was then exchanged over a
0.035 wire for a 5 French vascular sheath.

A Bentson wire was carefully advanced through the right atrium and
into the inferior vena cava. The 5 French sheath was removed. The
skin tract was dilated to 11 French. A 10 French tips sheath was
then advanced over the wire into the inferior vena cava. An MPA
catheter was advanced over the Bentson wire. The tips sheath was
pulled back into the right atrium. The MPA catheter was carefully
pulled back and used to select the middle hepatic vein adjacent to
the tips stent. A rose in wire was placed in the middle hepatic
vein. The tips sheath was then advanced and torqued until it was
directed toward the tips stent.

The MPA catheter was reintroduced through the tips sheath adjacent
to the rose in wire and used to successfully catheterize the tips
stent graft. The Bentson wire was exchanged for a J wire which was
then advanced through the stent graft and into the portal vein. The
J wire was utilized to ensure that the wire did not pass through the
interstices of the uncovered portion of the Viatorr. A pigtail
catheter was advanced in the main portal vein. Portal venography was
performed. The stent graft appears to be patent. The graft is not
occluded as was suspected on the recent duplex ultrasound. However,
flow does appear to be slow.

The pigtail catheter was removed over an Amplatz wire. A 10 x 40 mm
mustang balloon was introduced over the wire and the stent graft was
angioplasty to 10 mm. The balloon was inflated to full effacement.

The balloon was then exchanged for the pigtail catheter in
additional portal venography was performed. The tips is widely
patent with excellent flow. Portal venous pressures were measured
and found to be quite high at 25 mm Hg.

The catheter was removed over a wire. The tips sheath was removed.
Hemostasis was attained by manual pressure.
IMPRESSION: 1. Successful revision of TIPS to 10 mm. The Viatorr stent graft is
widely patent.
2. Persistent severe portal hypertension despite widely patent and
well positioned TIPS. If patient continues to develop recurrent
large volume ascites, consideration could be given for parallel TIPS
placement.

Patient will be admitted to the [REDACTED] for overnight
observation. Outpatient follow-up with a TIPS ultrasound in 1 month.

## 2020-04-11 MED ORDER — CEFAZOLIN SODIUM-DEXTROSE 2-4 GM/100ML-% IV SOLN
INTRAVENOUS | Status: AC
  Filled 2020-04-11: qty 100

## 2020-04-11 MED ORDER — IOHEXOL 300 MG/ML  SOLN
100.0000 mL | Freq: Once | INTRAMUSCULAR | Status: AC | PRN
  Administered 2020-04-11: 100 mL via INTRAVENOUS

## 2020-04-11 MED ORDER — SODIUM CHLORIDE 0.9 % IV SOLN: INTRAVENOUS | Status: DC

## 2020-04-11 MED ORDER — IOHEXOL 300 MG/ML  SOLN
100.0000 mL | Freq: Once | INTRAMUSCULAR | Status: AC | PRN
  Administered 2020-04-11: 20 mL via INTRAVENOUS

## 2020-04-11 MED ORDER — LIDOCAINE HCL (PF) 1 % IJ SOLN
INTRAMUSCULAR | Status: DC | PRN
  Administered 2020-04-11: 10 mL

## 2020-04-11 MED ORDER — LIDOCAINE HCL 1 % IJ SOLN
INTRAMUSCULAR | Status: AC
  Filled 2020-04-11: qty 20

## 2020-04-11 MED ORDER — FENTANYL CITRATE (PF) 100 MCG/2ML IJ SOLN
INTRAMUSCULAR | Status: AC
  Filled 2020-04-11: qty 2

## 2020-04-11 MED ORDER — LIDOCAINE HCL 1 % IJ SOLN
INTRAMUSCULAR | Status: AC | PRN
  Administered 2020-04-11: 10 mL

## 2020-04-11 MED ORDER — CEFAZOLIN SODIUM-DEXTROSE 2-4 GM/100ML-% IV SOLN
2.0000 g | INTRAVENOUS | Status: AC
  Administered 2020-04-11: 2 g via INTRAVENOUS

## 2020-04-11 MED ORDER — FENTANYL CITRATE (PF) 100 MCG/2ML IJ SOLN
INTRAMUSCULAR | Status: AC | PRN
  Administered 2020-04-11: 50 ug via INTRAVENOUS

## 2020-04-11 MED ORDER — MIDAZOLAM HCL 2 MG/2ML IJ SOLN
INTRAMUSCULAR | Status: AC
  Filled 2020-04-11: qty 2

## 2020-04-11 MED ORDER — MIDAZOLAM HCL 2 MG/2ML IJ SOLN
INTRAMUSCULAR | Status: AC | PRN
  Administered 2020-04-11: 1 mg via INTRAVENOUS

## 2020-04-11 NOTE — Sedation Documentation (Signed)
Gave report to L. Tour manager.  85mg fent and 154mversed left for sedation, handed off to L. PaTour manager Pt care transferred to L. PaJerline PainN

## 2020-04-11 NOTE — Progress Notes (Signed)
On arrival from radiology NSS infusing at 50cc/hr

## 2020-04-11 NOTE — H&P (Signed)
Chief Complaint: Patient was seen in consultation today for TIPS revision and paracentesis  Referring Physician(s): Gerrit Heck  Supervising Physician: Jacqulynn Cadet  Patient Status: [facility] - Out-pt  History of Present Illness: Martin Strong is a 54 y.o. male with a past medical history significant for arthritis, HLD. 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 Laurence Ferrari. The procedure was performed without complication and he was discharged to home the following day. He underwent a 3.0 L paracentesis on 4/22 and a duplex US of liver/TIPS shunt which showed no flow within the TIPS stent concerning for occlusion vs very low flow state. He presents today for a TIPS revision and paracentesis.  Mr. [name] reports some abdominal pain due to distention as well as significant fatigue which is about the same as usual. He also reports significant bilateral lower extremity edema. He denies fevers, chills, n/v. He has been taking diuretics as prescribed. He states understanding of the requested procedure and wishes to proceed as planned.   Past Medical History:  Diagnosis Date  . Anemia   . Arthritis   . Cirrhosis ([place])   . Diabetes mellitus without complication ([place])   . Dyspnea   . Fatty liver   . GERD (gastroesophageal reflux disease)   . GI bleed   . Hyperlipidemia   . Hypertension     Past Surgical History:  Procedure Laterality Date  . BIOPSY  02/14/2019   Procedure: BIOPSY;  Surgeon: Lavena Bullion, DO;  Location: [facility];  Service: Gastroenterology;;  . BIOPSY  07/22/2019   Procedure: BIOPSY;  Surgeon: Lavena Bullion, DO;  Location: [facility];  Service: Gastroenterology;;  . ESOPHAGEAL BANDING  10/07/2018   Procedure: ESOPHAGEAL BANDING;  Surgeon: Lavena Bullion, DO;  Location: [place];  Service: Gastroenterology;;  . ESOPHAGEAL  BANDING N/A 01/03/2019   Procedure: ESOPHAGEAL BANDING;  Surgeon: Lavena Bullion, DO;  Location: [facility];  Service: Gastroenterology;  Laterality: N/A;  . ESOPHAGEAL BANDING  02/14/2019   Procedure: ESOPHAGEAL BANDING;  Surgeon: Lavena Bullion, DO;  Location: [facility];  Service: Gastroenterology;;  . esophageal bands    . ESOPHAGOGASTRODUODENOSCOPY Left 07/22/2019   Procedure: ESOPHAGOGASTRODUODENOSCOPY (EGD) WITH POSSIBLE BANDING;  Surgeon: Lavena Bullion, DO;  Location: [facility];  Service: Gastroenterology;  Laterality: Left;  . ESOPHAGOGASTRODUODENOSCOPY (EGD) WITH PROPOFOL N/A 10/07/2018   Procedure: ESOPHAGOGASTRODUODENOSCOPY (EGD) WITH PROPOFOL;  Surgeon: Lavena Bullion, DO;  Location: [place];  Service: Gastroenterology;  Laterality: N/A;  . ESOPHAGOGASTRODUODENOSCOPY (EGD) WITH PROPOFOL N/A 01/03/2019   Procedure: ESOPHAGOGASTRODUODENOSCOPY (EGD) WITH PROPOFOL;  Surgeon: Lavena Bullion, DO;  Location: [facility];  Service: Gastroenterology;  Laterality: N/A;  . ESOPHAGOGASTRODUODENOSCOPY (EGD) WITH PROPOFOL N/A 02/14/2019   Procedure: ESOPHAGOGASTRODUODENOSCOPY (EGD) WITH PROPOFOL;  Surgeon: Lavena Bullion, DO;  Location: [facility];  Service: Gastroenterology;  Laterality: N/A;  . IR PARACENTESIS  10/05/2019  . IR PARACENTESIS  10/24/2019  . IR PARACENTESIS  11/16/2019  . IR PARACENTESIS  12/25/2019  . IR PARACENTESIS  01/05/2020  . IR PARACENTESIS  01/23/2020  . IR PARACENTESIS  02/08/2020  . IR PARACENTESIS  02/16/2020  . IR PARACENTESIS  02/28/2020  . IR PARACENTESIS  03/12/2020  . IR PARACENTESIS  04/04/2020  . IR RADIOLOGIST EVAL & MGMT  12/19/2019  . IR RADIOLOGIST EVAL & MGMT  02/27/2020  . IR RADIOLOGIST EVAL & MGMT  04/09/2020  . IR TIPS  03/12/2020  .  RADIOLOGY WITH ANESTHESIA N/A 03/12/2020   Procedure: TIPS;  Surgeon: Jacqulynn Cadet, MD;  Location: [place];  Service: Radiology;  Laterality: N/A;    Allergies: Nadolol  Medications: Prior  to Admission medications   Medication Sig Start Date End Date Taking? Authorizing Provider  CALCIUM PO Take 750 mg by mouth daily.   Yes [provider]  cholecalciferol (VITAMIN D) 1000 units tablet Take 1,000 Units by mouth 2 (two) times daily.   Yes [provider]  ciprofloxacin (CIPRO) 500 MG tablet Take Cipro 750 mg weekly Patient taking differently: Take 500 mg by mouth every Monday.  02/26/20  Yes Cirigliano, Vito V, DO  dapagliflozin propanediol (FARXIGA) 10 MG TABS tablet Take 10 mg by mouth at bedtime.   Yes [provider]  feeding supplement, ENSURE ENLIVE, (ENSURE ENLIVE) LIQD Take 237 mLs by mouth 2 (two) times daily between meals. 01/28/20  Yes Hosie Poisson, MD  ferrous sulfate (KP FERROUS SULFATE) 325 (65 FE) MG tablet Take 1 tablet (325 mg total) by mouth daily with breakfast. Patient taking differently: Take 325 mg by mouth 2 (two) times daily.  05/30/19  Yes Cirigliano, Vito V, DO  furosemide (LASIX) 40 MG tablet Take 40 mg by mouth daily.   Yes [provider]  glipiZIDE (GLUCOTROL XL) 10 MG 24 hr tablet Take 10 mg by mouth 2 (two) times daily.   Yes [provider]  lactulose (CHRONULAC) 10 GM/15ML solution Take 30 mLs (20 g total) by mouth 3 (three) times daily. 03/13/20  Yes Ardis Rowan, PA-C  metFORMIN (GLUCOPHAGE) 1000 MG tablet Take 1,000 mg by mouth 2 (two) times daily with a meal.   Yes [provider]  Multiple Vitamin (MULTIVITAMIN WITH MINERALS) TABS tablet Take 1 tablet by mouth at bedtime.   Yes [provider]  potassium chloride (KLOR-CON) 10 MEQ tablet Take 10 mEq by mouth daily.   Yes [provider]  propranolol (INDERAL) 40 MG tablet Take 0.5 tablets (20 mg total) by mouth 2 (two) times daily. Patient taking differently: Take 40 mg by mouth 2 (two) times daily.  01/27/20  Yes Hosie Poisson, MD  furosemide (LASIX) 20 MG tablet Take 1 tablet (20 mg total) by mouth daily. Patient not  taking: Reported on 03/05/2020 12/27/19   Lavena Bullion, DO     Family History  Problem Relation Age of Onset  . Diabetes Other   . Esophageal cancer Father   . Colon cancer Neg Hx   . Rectal cancer Neg Hx   . Stomach cancer Neg Hx     Social History   Socioeconomic History  . Marital status: Married    Spouse name: Not on file  . Number of children: 2  . Years of education: Not on file  . Highest education level: Not on file  Occupational History  . Not on file  Tobacco Use  . Smoking status: Never Smoker  . Smokeless tobacco: Never Used  Substance and Sexual Activity  . Alcohol use: Not Currently  . Drug use: Never  . Sexual activity: Not on file  Other Topics Concern  . Not on file  Social History Narrative  . Not on file   Social Determinants of Health   Financial Resource Strain:   . Difficulty of Paying Living Expenses:   Food Insecurity:   . Worried About Charity fundraiser in the Last Year:   . Arboriculturist in the Last Year:   [employer]  Needs:   . Lack of Transportation (Medical):   Marland Kitchen Lack of Transportation (Non-Medical):   Physical Activity:   . Days of Exercise per Week:   . Minutes of Exercise per Session:   Stress:   . Feeling of Stress :   Social Connections:   . Frequency of Communication with Friends and Family:   . Frequency of Social Gatherings with Friends and Family:   . Attends Religious Services:   . Active Member of Clubs or Organizations:   . Attends Archivist Meetings:   Marland Kitchen Marital Status:      Review of Systems: A 12 point ROS discussed and pertinent positives are indicated in the HPI above.  All other systems are negative.  Review of Systems  Constitutional: Positive for fatigue. Negative for chills and fever.  Respiratory: Negative for cough and shortness of breath.   Cardiovascular: Positive for leg swelling. Negative for chest pain.  Gastrointestinal: Positive for abdominal distention and abdominal  pain. Negative for diarrhea, nausea and vomiting.  Musculoskeletal: Negative for back pain.  Skin: Negative for wound.  Neurological: Negative for dizziness and headaches.    Vital Signs: BP (!) 135/92   Pulse 95   Temp 98.2 F (36.8 C) (Skin)   Resp 18   Ht 6' 1"  (1.854 m)   Wt 230 lb (104.3 kg)   SpO2 98%   BMI 30.34 kg/m   Physical Exam Vitals reviewed.  Constitutional:      General: He is not in acute distress. HENT:     Head: Normocephalic.     Mouth/Throat:     Mouth: Mucous membranes are moist.     Pharynx: Oropharynx is clear. No oropharyngeal exudate or posterior oropharyngeal erythema.  Eyes:     General: No scleral icterus. Cardiovascular:     Rate and Rhythm: Normal rate and regular rhythm.  Pulmonary:     Effort: Pulmonary effort is normal.     Breath sounds: Normal breath sounds.  Abdominal:     General: There is distension.     Palpations: Abdomen is soft.     Tenderness: There is no abdominal tenderness.  Skin:    General: Skin is warm and dry.     Coloration: Skin is not jaundiced.  Neurological:     Mental Status: He is alert and oriented to person, place, and time.  Psychiatric:        Mood and Affect: Mood normal.        Behavior: Behavior normal.        Thought Content: Thought content normal.        Judgment: Judgment normal.      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 He underwent creation of an elective tips on 03/12/2020 and presents today for initial follow-up evaluation. EXAM: DUPLEX ULTRASOUND OF LIVER AND TIPS SHUNT TECHNIQUE: Color and duplex Doppler ultrasound was performed to evaluate the hepatic in-flow and out-flow vessels.  COMPARISON:  Tips creation 03/12/2020 FINDINGS: Portal Vein Velocities Main:  73 cm/sec Right: Not visualized Left:  27 cm/sec TIPS Stent Velocities Proximal: No flow Mid: No flow Distal: No flow IVC: Present and patent with normal respiratory phasicity. Hepatic Vein Velocities Right:  45 cm/sec Mid: Not visualized Left: Not  visualized Splenic Vein: 34 centimeters/second Superior Mesenteric Vein: Not visualized Hepatic Artery: 137 centimeters/second Ascites: Large volume ascites Varices: None visualized Other findings: Small left pleural effusion. The gallbladder is distended and contains numerous echogenic foci consistent with cholelithiasis. IMPRESSION: 1. No flow is visualized within the TIPS stent. Findings are concerning for occlusion versus very low flow state. 2. Recurrent large volume ascites. 3. Small left pleural effusion. 4. Cholelithiasis. Signed, Criselda Peaches, MD, [facility] Radiology Electronically Signed   By: Jacqulynn Cadet M.D.   On: 04/09/2020 11:05   IR Radiologist Eval & Mgmt  Result Date: 04/09/2020 Please refer to notes tab for details about interventional procedure. (Op Note)  IR Paracentesis  Result Date: 04/04/2020 INDICATION: Patient with history of alcoholic cirrhosis, prior TIPS, recurrent ascites; request received for diagnostic and therapeutic paracentesis up to 3 liters. EXAM: ULTRASOUND GUIDED DIAGNOSTIC AND THERAPEUTIC PARACENTESIS MEDICATIONS: None COMPLICATIONS: None immediate. PROCEDURE: Informed written consent was obtained from the patient after a discussion of the risks, benefits and alternatives to treatment. A timeout was performed prior to the initiation of the procedure. Initial ultrasound scanning demonstrates a large amount of ascites within the right lower abdominal quadrant. The right lower abdomen was prepped and draped in the usual sterile fashion. 1% lidocaine was used for local anesthesia.  Following this, a 19 gauge, 10-cm, Yueh catheter was introduced. An ultrasound image was saved for documentation purposes. The paracentesis was performed. The catheter was removed and a dressing was applied. The patient tolerated the procedure well without immediate post procedural complication. FINDINGS: A total of approximately 3.2 liters of turbid, light yellow fluid was removed. Samples were sent to the laboratory as requested by the clinical team. IMPRESSION: Successful ultrasound-guided diagnostic and therapeutic paracentesis yielding 3.2 liters of peritoneal fluid. Read by: Rowe Robert, PA-C Electronically Signed   By: Corrie Mckusick D.O.   On: 04/04/2020 11:30    Labs:  CBC: Recent Labs    03/07/20 1155 03/12/20 0730 03/13/20 0143 04/04/20 1109  WBC 7.2 5.7 4.7 6.1  HGB 8.7* 8.4* 7.1* 9.4*  HCT 29.1* 27.4* 22.4* 29.1*  PLT 155 166 106* 135*    COAGS: Recent Labs    01/23/20 1938 01/23/20 1938 02/08/20 1132 03/12/20 0730 03/13/20 0143 04/04/20 1109  INR 1.5*   < > 1.3* 1.4* 1.6* 1.2*  APTT 43*  --   --   --   --   --    < > = values in this interval not displayed.    BMP: Recent Labs    03/07/20 1155 03/12/20 0730 03/13/20 0143 04/04/20 1109  NA 136 138 137 143  K 3.6 3.4* 3.4* 3.6  CL 100 98 101 109  CO2 22 27 27 25   GLUCOSE 92 120* 188* 90  BUN 23* 18 18 13   CALCIUM 8.7* 8.9 8.3* 8.5*  CREATININE 1.04 1.04 1.04 0.68*  GFRNONAA >60 >60 >60 110  GFRAA >60 >60 >60 127    LIVER FUNCTION TESTS: Recent Labs    02/08/20 1132 02/08/20 1132 03/07/20 1155 03/12/20 0730 03/13/20 0143 04/04/20 1109  BILITOT 0.7   < > 1.4* 1.2 0.9 1.3*  AST 27   < > 41 36 37 42*  ALT 22   < > 27 29 26 29   ALKPHOS 93  --  70 69 50  --   PROT 7.4   < > 8.1 7.7 5.9* 6.9  ALBUMIN 3.6  --  3.2* 3.2* 2.9*  --    < > =  values in this interval not displayed.    TUMOR MARKERS: No results for input(s): AFPTM, CEA, CA199, CHROMGRNA in the last 8760 hours.  Assessment and  Plan:  54 y/o M with history of NASH cirrhosis with recurrent ascites s/p TIPS placement on 03/12/20 who was found to have no flow within the TIPS stent concerning for thrombosis vs low flow who presents today for TIPS revision and paracentesis.  Risks and benefits of TIPS revision were discussed with the patient and/or the patient's family including, but not limited to, infection, bleeding, damage to adjacent structures, worsening hepatic and/or cardiac function, worsening and/or the development of altered mental status/encephalopathy, non-target embolization and death.   This interventional procedure involves the use of X-rays and because of the nature of the planned procedure, it is possible that we will have prolonged use of X-ray fluoroscopy.  Potential radiation risks to you include (but are not limited to) the following: - A slightly elevated risk for cancer  several years later in life. This risk is typically less than 0.5% percent. This risk is low in comparison to the normal incidence of human cancer, which is 33% for women and 50% for men according to the [place]. - Radiation induced injury can include skin redness, resembling a rash, tissue breakdown / ulcers and hair loss (which can be temporary or permanent).   The likelihood of either of these occurring depends on the difficulty of the procedure and whether you are sensitive to radiation due to previous procedures, disease, or genetic conditions.   IF your procedure requires a prolonged use of radiation, you will be notified and given written instructions for further action.  It is your responsibility to monitor the irradiated area for the 2 weeks following the procedure and to notify your physician if you are concerned that you have suffered a radiation induced injury.    All of the patient's questions were answered, patient is agreeable to proceed.  Consent signed and in chart.  Thank you for this interesting  consult.  I greatly enjoyed meeting Cha Gomillion and look forward to participating in their care.  A copy of this report was sent to the requesting provider on this date.  Electronically Signed: Joaquim Nam, PA-C 04/11/2020, 1:18 PM   I spent a total of  25 Minutes in face to face in clinical consultation, greater than 50% of which was counseling/coordinating care for TIPS revision/paracentesis.

## 2020-04-11 NOTE — Procedures (Signed)
Interventional Radiology Procedure Note  Procedure: TIPS evaluated and expanded to 10 mm  Complications: None  Estimated Blood Loss: None  Recommendations: - Bedrest x 2 hrs - DC home - F/U in clinic   Signed,  Criselda Peaches, MD

## 2020-04-11 NOTE — Procedures (Signed)
PROCEDURE SUMMARY:  Successful US guided paracentesis from left abdomen.  Yielded 4L of yellow hazy fluid.  No immediate complications.  Pt tolerated well.    EBL < 67m  JTheresa Duty NP 04/11/2020 3:36 PM

## 2020-04-11 NOTE — Discharge Instructions (Addendum)
No Metformin/Glucophage for 2 days    Call [phone] if any problems,questions, or concerns, call if any bleeding, drainage, fever, pain, swelling, or redness at right neck site   Moderate Conscious Sedation, Adult, Care After These instructions provide you with information about caring for yourself after your procedure. Your health care provider may also give you more specific instructions. Your treatment has been planned according to current medical practices, but problems sometimes occur. Call your health care provider if you have any problems or questions after your procedure. What can I expect after the procedure? After your procedure, it is common:  To feel sleepy for several hours.  To feel clumsy and have poor balance for several hours.  To have poor judgment for several hours.  To vomit if you eat too soon. Follow these instructions at home: For at least 24 hours after the procedure:   Do not: ? Participate in activities where you could fall or become injured. ? Drive. ? Use heavy machinery. ? Drink alcohol. ? Take sleeping pills or medicines that cause drowsiness. ? Make important decisions or sign legal documents. ? Take care of children on your own.  Rest. Eating and drinking  Follow the diet recommended by your health care provider.  If you vomit: ? Drink water, juice, or soup when you can drink without vomiting. ? Make sure you have little or no nausea before eating solid foods. General instructions  Have a responsible adult stay with you until you are awake and alert.  Take over-the-counter and prescription medicines only as told by your health care provider.  If you smoke, do not smoke without supervision.  Keep all follow-up visits as told by your health care provider. This is important. Contact a health care provider if:  You keep feeling nauseous or you keep vomiting.  You feel light-headed.  You develop a rash.  You have a fever. Get  help right away if:  You have trouble breathing. This information is not intended to replace advice given to you by your health care provider. Make sure you discuss any questions you have with your health care provider. Document Revised: 11/12/2017 Document Reviewed: 03/21/2016 Elsevier Patient Education  2020 [employer].

## 2020-04-11 NOTE — Progress Notes (Signed)
Per Dr Laurence Ferrari client needs to hold metformin for 2 days

## 2020-04-17 ENCOUNTER — Ambulatory Visit: Payer: BC Managed Care – PPO | Admitting: Physical Therapy

## 2020-04-23 ENCOUNTER — Other Ambulatory Visit: Payer: Self-pay

## 2020-04-23 ENCOUNTER — Encounter: Payer: Self-pay | Admitting: Gastroenterology

## 2020-04-23 ENCOUNTER — Ambulatory Visit (INDEPENDENT_AMBULATORY_CARE_PROVIDER_SITE_OTHER): Payer: BC Managed Care – PPO | Admitting: Gastroenterology

## 2020-04-23 VITALS — BP 132/68 | HR 93 | Temp 98.2°F | Ht 73.0 in | Wt 246.2 lb

## 2020-04-23 DIAGNOSIS — K766 Portal hypertension: Secondary | ICD-10-CM | POA: Diagnosis not present

## 2020-04-23 DIAGNOSIS — K7460 Unspecified cirrhosis of liver: Secondary | ICD-10-CM | POA: Diagnosis not present

## 2020-04-23 DIAGNOSIS — Z8619 Personal history of other infectious and parasitic diseases: Secondary | ICD-10-CM

## 2020-04-23 DIAGNOSIS — R188 Other ascites: Secondary | ICD-10-CM

## 2020-04-23 DIAGNOSIS — K7581 Nonalcoholic steatohepatitis (NASH): Secondary | ICD-10-CM | POA: Diagnosis not present

## 2020-04-23 DIAGNOSIS — M6284 Sarcopenia: Secondary | ICD-10-CM

## 2020-04-23 DIAGNOSIS — I8500 Esophageal varices without bleeding: Secondary | ICD-10-CM

## 2020-04-23 DIAGNOSIS — K3189 Other diseases of stomach and duodenum: Secondary | ICD-10-CM

## 2020-04-23 DIAGNOSIS — K219 Gastro-esophageal reflux disease without esophagitis: Secondary | ICD-10-CM

## 2020-04-23 NOTE — Patient Instructions (Signed)
If you are age 53 or older, your body mass index should be between 23-30. Your Body mass index is 32.49 kg/m. If this is out of the aforementioned range listed, please consider follow up with your Primary Care Provider.  If you are age 36 or younger, your body mass index should be between 19-25. Your Body mass index is 32.49 kg/m. If this is out of the aformentioned range listed, please consider follow up with your Primary Care Provider.   You have been scheduled for an abdominal paracentesis at [facility] radiology (1st floor of hospital) on 04/25/20 at [place]. Please arrive at least 15 minutes prior to your appointment time for registration. Should you need to reschedule this appointment for any reason, please call our office at [phone].  Please go to the lab at [facility] ([place] You will need to go to level "B", you do not need an appointment for this. Hours available are 7:30 am - 4:30 pm.   You have been referred to a Nutrition you will be contacted with thiis appointment.   You will be contacted with a follow up with Dr. Laurence Ferrari and [facility].   It was a pleasure to see you today!  Vito Cirigliano, D.O.

## 2020-04-23 NOTE — Progress Notes (Signed)
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 [facility] [place]. Cirrhosis complicated by esophageal varices with bleeding requiring multiple EGDs with banding in the past along with propranolol for dual prophylaxis.Developed large volume ascites in 09/2019. Diuretic intolerant, and has required serial large-volume paracentesisand low-sodium diet with low-dose diuretics. Otherwise no history ofhepatic encephalopathy.Admitted with SBP and AKI in 01/2020; diuretics have since been discontinued.  Also follows with [facility]. Evaluated by Dr. Laurence Ferrari in IR for possible TIPSin 12/2018.  Cirrhosis Hx: - Etiology: NASH - Diagnosed 2017 - Liver GT:XMIW - Complications: EV with admission for bleeding 09/2018 treated with EGD with EVL, large volume ascitesserial LVP starting 09/2019, SBP 01/2020 -HepaticRx: None currently -Previous hepatic Rx: Propranolol (dual prophy)and PPI discontinued due to SBP.  History of diuretic intolerance.  Follows with Nephrology-restarted Lasix 40 mg/day 03/2020.   - [place] Screening:MRI 11/2019: Ascites, no HCC. AFP <0.9. -03/13/2020: TIPS procedure -04/11/2020: TIPS revision -Annual lab check: Completed 05/2019-mild iron deficiency. Takes ferrous sulfate -HAV and HBV vaccine series started 05/2019 - MELD:11(fluctuates 11-14) - Child Pugh: B(7pts)  Endoscopic history: -EGD x3 in 2017 in [place], [place] for esophageal varices with banding -EGD (09/2018, Dr. Bryan Lemma): Grade 2 esophageal varices with stigmata of recent bleeding requiring banding x2 with complete eradication, portal hypertensive gastropathy without bleeding. -  EGD (12/2018, Dr. Bryan Lemma): Grade II esophageal varices, 6 bands placed with deflation. Normal Z line at 46 cm, severe portal HTN gastropathy with contact oozing, small GOV1, normal duodenum. H pylori serologies negative -EGD (02/2019, Dr. Bryan Lemma): Grade 2 esophageal varices, 4 bands placed with complete eradication.Portal hypertensive gastropathy/2 adenopathy -EGD (07/2019, Dr. Bryan Lemma): Done for dysphagia. grade 1 esophageal varices, multiple post variceal banding scars in the lower third with 1 area of luminal deformity, likely consistent with prior deep banding site. The lumen was mildly narrowed, but easily traversed and no additional endoscopic dilation performed. Mild esophagitis at the GEJ, portal hypertensive gastropathy, peptic antritis/duodenitis. Papilla prominent.  HPI:     Patient is a 54 y.o. male presenting to the Gastroenterology Clinic for follow-up.  Last seen by me on 02/26/2020.  Since then, patent TIPS on 03/13/2020.  However, has had reaccumulation of ascites requiring repeat paracentesis on 04/02/2020 (3.2 L)  IR evaluation and duplex ultrasound of TIPS shunt on 04/09/2020 indicated no flow visualized within TIPS stent concerning for occlusion vs very low flow state with recurrent large volume ascites and small left pleural effusion.  Repeat paracentesis (4 L removed) and performed TIPS revision to 10 mm 04/11/2020.  Of note, persistent severe portal hypertension despite widely patent and well-positioned TIPS noted postoperatively.  If recurrent ascites, recommended evaluation for parallel TIPS placement.  Plan for outpatient follow-up and TIPS ultrasound in 1 month.  Separately, was seen by Nephrology last month and restarted Lasix 40 mg/day. Has f/u this week.  Stable renal function as below.  Today, he states he has continued to have ascites accumulating since TIPS revision on 04/11/20. Fatigue, decreased muscle strength.    04/11/2020: BUN/creatinine 11/0.8, albumin  2.7, T bili 1.3, NA 141, PLT 154, INR 1.3    Review of systems:     No chest pain, no SOB, no fevers, no urinary sx  Past Medical History:  Diagnosis Date  . Anemia   . Arthritis   . Cirrhosis ([place])   . Diabetes mellitus without complication ([place])   . Dyspnea   . Fatty liver   . GERD (gastroesophageal reflux disease)   . GI bleed   . Hyperlipidemia   . Hypertension     Patient's surgical history, family medical history, social history, medications and allergies were all reviewed in Epic    Current Outpatient Medications  Medication Sig Dispense Refill  . CALCIUM PO Take 750 mg by mouth daily.    . cholecalciferol (VITAMIN D) 1000 units tablet Take 1,000 Units by mouth 2 (two) times daily.    . ciprofloxacin (CIPRO) 500 MG tablet Take Cipro 750 mg weekly (Patient taking differently: Take 500 mg by mouth every Monday. ) 8 tablet 3  . dapagliflozin propanediol (FARXIGA) 10 MG TABS tablet Take 10 mg by mouth at bedtime.    . feeding supplement, ENSURE ENLIVE, (ENSURE ENLIVE) LIQD Take 237 mLs by mouth 2 (two) times daily between meals. 237 mL 12  . ferrous sulfate (KP FERROUS SULFATE) 325 (65 FE) MG tablet Take 1 tablet (325 mg total) by mouth daily with breakfast. (Patient taking differently: Take 325 mg by mouth 2 (two) times daily. ) 30 tablet 3  . furosemide (LASIX) 40 MG tablet Take 40 mg by mouth daily.    Marland Kitchen glipiZIDE (GLUCOTROL XL) 10 MG 24 hr tablet Take 10 mg by mouth 2 (two) times daily.    Marland Kitchen lactulose (CHRONULAC) 10 GM/15ML solution Take 30 mLs (20 g total) by mouth 3 (three) times daily. 236 mL 0  . metFORMIN (GLUCOPHAGE) 1000 MG tablet Take 1,000 mg by mouth 2 (two) times daily with a meal.    . Multiple Vitamin (MULTIVITAMIN WITH MINERALS) TABS tablet Take 1 tablet by mouth at bedtime.    . potassium chloride (KLOR-CON) 10 MEQ tablet Take 10 mEq by mouth daily.    . propranolol (INDERAL) 40 MG tablet Take 0.5 tablets (20 mg total) by mouth 2 (two) times daily. (Patient  taking differently: Take 40 mg by mouth 2 (two) times daily. ) 60 tablet 0   No current facility-administered medications for this visit.    Physical Exam:     BP 132/68   Pulse 93   Temp 98.2 F (36.8 C)   Ht 6' 1"  (1.854 m)   Wt 246 lb 4 oz (111.7 kg)   BMI 32.49 kg/m   GENERAL:  Pleasant male in NAD PSYCH: : Cooperative, normal affect ABDOMEN: Distended abdomen with fluid wave.  Nontender.   Musculoskeletal: Temporal muscle wasting, loss of muscle tone NEURO: Alert and oriented x 3, no focal neurologic deficits   IMPRESSION and PLAN:    1)NASHCirrhosis 2) Ascites 3) SBP 01/2020 4) Esophageal varices 5) Portal hypertensive gastropathy 6) Sarcopenia 7) Failed TIPS procedure  History ofNASH cirrhosis with decompensation as manifested by bleeding esophageal varices, large volume ascites, and SBP.No history of  HE.   Underwent TIPS 03/13/2020, with subsequent ultrasound demonstrated no flow, so underwent TIPS revision on 04/11/20.  However, he continues to accumulate fluid, so high clinical concern for ongoing TIPS malfunction.  - Cipro 750 mg weekly for SBP prophylaxis - Schedule repeat paracentesis.  Not to exceed 6 L removed.  Give albumin 25% 12.5 g for every 2 L removed -Will call to get follow-up appointment with Dr. Laurence Ferrari in IR tomorrow to discuss TIPS reevaluation.  Per previous notes, may have to consider  parallel TIPS placement -Resume Lasix 40 mg/day per Nephrology.  Has appointment with nephrology later this week -PPI discontinued due to SBP -Resume low-sodium diet -UTD on vaccine series and labs -Previously had headaches after discontinuing propranolol -Previously placed PT referral to assist in strengthening exercises, but he canceled feeling to weak to attend. Encouraged to reconsider -Nutrition referral discussed modifications to include branched chain amino acids for treatment of sarcopenia -We again discussed muscle wasting (sarcopenia) at  length today as related to underlying cirrhosis.  This can be an independent predictor of mortality and cirrhotics.  Unfortunately, his MELD score (11) is low compared with typically-transplanted patients.   -I discussed his case with [facility] at [facility] hepatology today -Follow-up with [facility] Locks at [place] as previously planned (he apparently canceled his last appointment) -Could therapy possible benefit for evaluation at [place] for living donor transplant? -Check zinc with next set of labs  8) GERD -No breakthrough reflux symptoms since stopped PPI -On-demand antacids or H2 RA as needed   RTC in 4 weeks or sooner as needed  I spent 40 minutes of time, including in depth chart review, independent review of results as outlined above, communicating results with the patient directly, face-to-face time with the patient, coordinating care, ordering studies and medications as appropriate, and documentation.            L[place] ,DO, FACG 04/23/2020, 9:20 AM

## 2020-04-24 ENCOUNTER — Ambulatory Visit (HOSPITAL_COMMUNITY)
Admission: RE | Admit: 2020-04-24 | Discharge: 2020-04-24 | Disposition: A | Discharge status: Home / Self Care | Payer: BC Managed Care – PPO | Source: Ambulatory Visit | Attending: Gastroenterology | Admitting: Gastroenterology

## 2020-04-24 ENCOUNTER — Other Ambulatory Visit: Payer: Self-pay | Admitting: Interventional Radiology

## 2020-04-24 DIAGNOSIS — R188 Other ascites: Secondary | ICD-10-CM | POA: Diagnosis not present

## 2020-04-24 DIAGNOSIS — K7581 Nonalcoholic steatohepatitis (NASH): Secondary | ICD-10-CM

## 2020-04-24 DIAGNOSIS — K7460 Unspecified cirrhosis of liver: Secondary | ICD-10-CM

## 2020-04-24 HISTORY — PX: IR PARACENTESIS: IMG2679

## 2020-04-24 LAB — BODY FLUID CELL COUNT WITH DIFFERENTIAL
Eos, Fluid: 0 %
Lymphs, Fluid: 73 %
Monocyte-Macrophage-Serous Fluid: 25 % — ABNORMAL LOW (ref 50–90)
Neutrophil Count, Fluid: 2 % (ref 0–25)
Total Nucleated Cell Count, Fluid: 113 cu mm (ref 0–1000)

## 2020-04-24 IMAGING — US IR PARACENTESIS
1 series · 3 of 3 positions shown · non-contrast
Comparison: none

INDICATION: Recurrent ascites

[Series 1: ir (id) (id)/(id)/(id) ir · 3 of 3 slices shown]
[im 1/3]
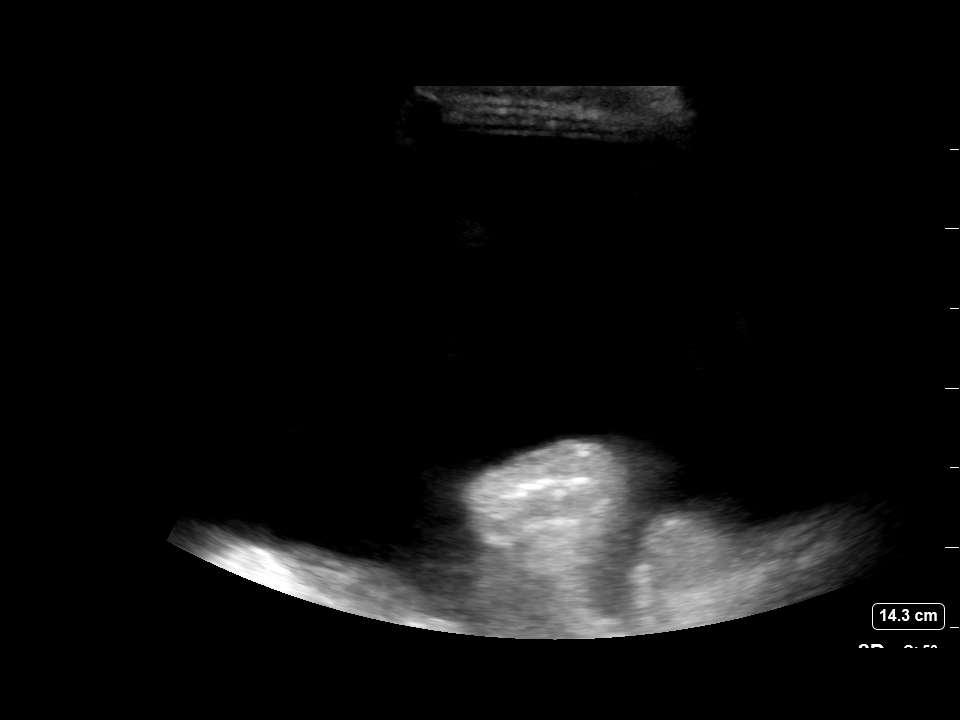
[im 2/3]
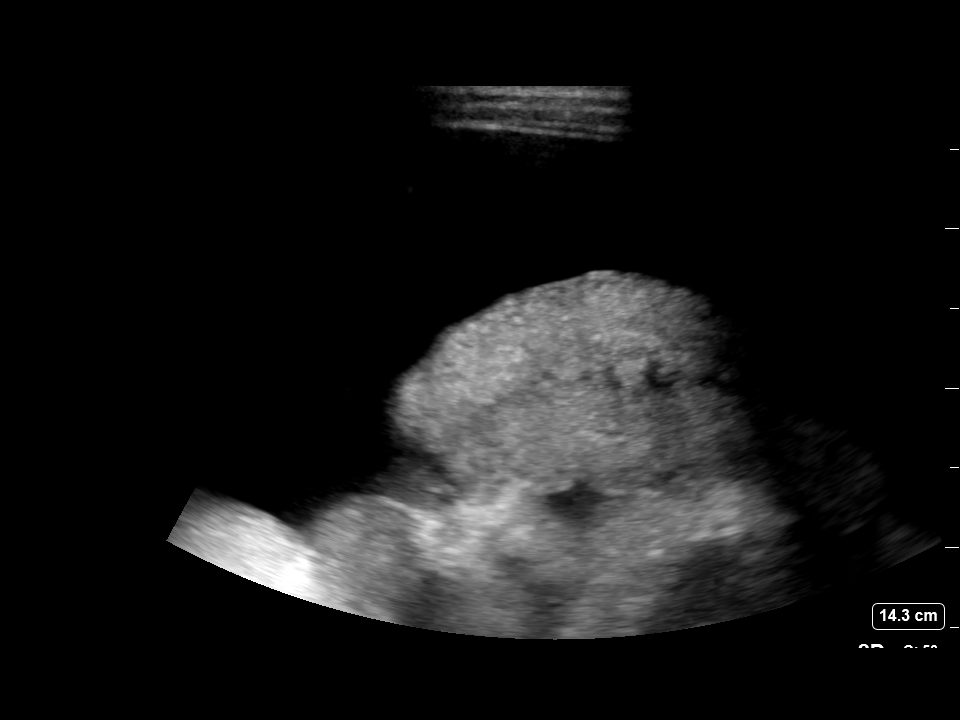
[im 3/3]
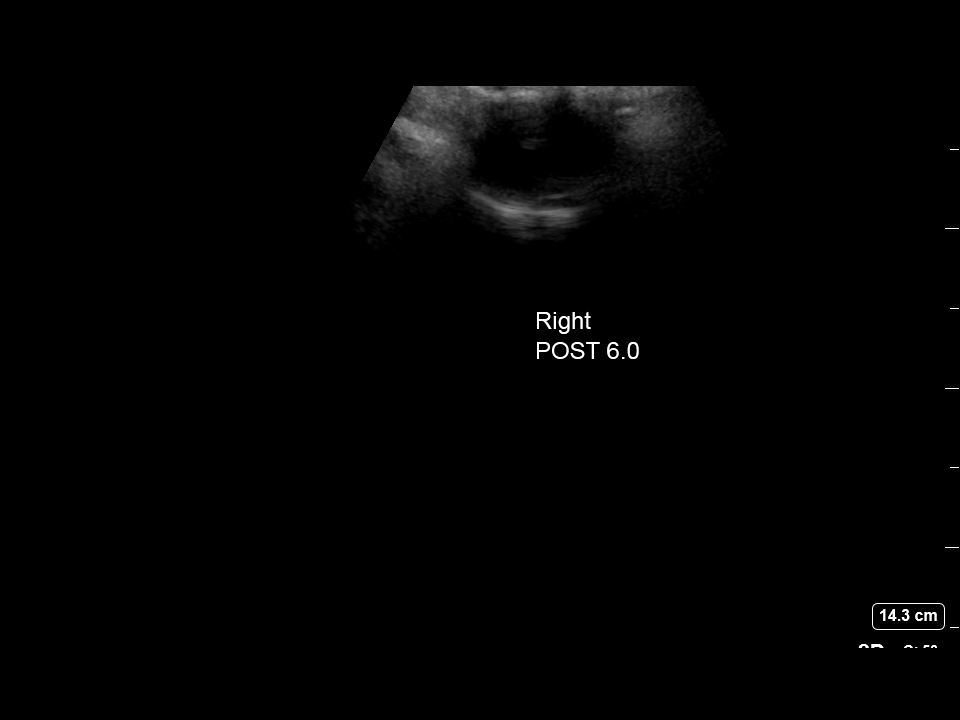

[3 of 3 positions shown; findings below may reference images not displayed]

EXAM:
ULTRASOUND GUIDED RLQ PARACENTESIS

MEDICATIONS:
10 cc 1% lidocaine

COMPLICATIONS:
None immediate.

PROCEDURE:
Informed written consent was obtained from the patient after a
discussion of the risks, benefits and alternatives to treatment. A
timeout was performed prior to the initiation of the procedure.

Initial ultrasound scanning demonstrates a large amount of ascites
within the right lower abdominal quadrant. The right lower abdomen
was prepped and draped in the usual sterile fashion. 1% lidocaine
was used for local anesthesia.

Following this, a 19 G Yueh catheter was introduced. An ultrasound
image was saved for documentation purposes. The paracentesis was
performed. The catheter was removed and a dressing was applied. The
patient tolerated the procedure well without immediate post
procedural complication.
Patient received post-procedure intravenous albumin; see nursing
notes for details.
FINDINGS: A total of approximately 6 liters of cloudy yellow fluid was
removed. Samples were sent to the laboratory as requested by the
clinical team.
IMPRESSION: Successful ultrasound-guided paracentesis yielding 6 liters of
peritoneal fluid.

Read by

CANAZ AZ

## 2020-04-24 MED ORDER — LIDOCAINE HCL 1 % IJ SOLN
INTRAMUSCULAR | Status: AC
  Filled 2020-04-24: qty 20

## 2020-04-24 NOTE — Procedures (Signed)
   US guided RLQ paracentesis  6 L maximum per MD 6 L obtained  Sent for labs per MD  Tolerated well  EBL: less than 1 cc

## 2020-04-25 ENCOUNTER — Ambulatory Visit (HOSPITAL_COMMUNITY): Payer: BC Managed Care – PPO

## 2020-04-25 LAB — PATHOLOGIST SMEAR REVIEW

## 2020-04-26 ENCOUNTER — Other Ambulatory Visit (INDEPENDENT_AMBULATORY_CARE_PROVIDER_SITE_OTHER): Payer: BC Managed Care – PPO

## 2020-04-26 DIAGNOSIS — K7581 Nonalcoholic steatohepatitis (NASH): Secondary | ICD-10-CM

## 2020-04-26 DIAGNOSIS — K7460 Unspecified cirrhosis of liver: Secondary | ICD-10-CM | POA: Diagnosis not present

## 2020-04-26 DIAGNOSIS — R188 Other ascites: Secondary | ICD-10-CM | POA: Diagnosis not present

## 2020-04-26 LAB — CBC WITH DIFFERENTIAL/PLATELET
Basophils Absolute: 0.1 10*3/uL (ref 0.0–0.1)
Basophils Relative: 1.2 % (ref 0.0–3.0)
Eosinophils Absolute: 0.4 10*3/uL (ref 0.0–0.7)
Eosinophils Relative: 4.8 % (ref 0.0–5.0)
HCT: 31.9 % — ABNORMAL LOW (ref 39.0–52.0)
Hemoglobin: 11 g/dL — ABNORMAL LOW (ref 13.0–17.0)
Lymphocytes Relative: 9.6 % — ABNORMAL LOW (ref 12.0–46.0)
Lymphs Abs: 0.8 10*3/uL (ref 0.7–4.0)
MCHC: 34.5 g/dL (ref 30.0–36.0)
MCV: 86 fl (ref 78.0–100.0)
Monocytes Absolute: 0.9 10*3/uL (ref 0.1–1.0)
Monocytes Relative: 10.1 % (ref 3.0–12.0)
Neutro Abs: 6.3 10*3/uL (ref 1.4–7.7)
Neutrophils Relative %: 74.3 % (ref 43.0–77.0)
Platelets: 160 10*3/uL (ref 150.0–400.0)
RBC: 3.71 Mil/uL — ABNORMAL LOW (ref 4.22–5.81)
RDW: 21 % — ABNORMAL HIGH (ref 11.5–15.5)
WBC: 8.5 10*3/uL (ref 4.0–10.5)

## 2020-04-26 LAB — BASIC METABOLIC PANEL
BUN: 11 mg/dL (ref 6–23)
CO2: 30 mEq/L (ref 19–32)
Calcium: 8.1 mg/dL — ABNORMAL LOW (ref 8.4–10.5)
Chloride: 102 mEq/L (ref 96–112)
Creatinine, Ser: 0.69 mg/dL (ref 0.40–1.50)
GFR: 120.23 mL/min (ref >60.00)
Glucose, Bld: 186 mg/dL — ABNORMAL HIGH (ref 70–99)
Potassium: 3.3 mEq/L — ABNORMAL LOW (ref 3.5–5.1)
Sodium: 138 mEq/L (ref 135–145)

## 2020-04-26 NOTE — Addendum Note (Signed)
Addended by: Trenda Moots on: 3/70/2301 10:43 AM   Modules accepted: Orders

## 2020-04-26 NOTE — Addendum Note (Signed)
Addended by: Trenda Moots on: 4/46/9507 10:43 AM   Modules accepted: Orders

## 2020-04-29 LAB — ZINC: Zinc: 37 ug/dL — ABNORMAL LOW (ref 60–130)

## 2020-05-01 ENCOUNTER — Other Ambulatory Visit: Payer: Self-pay | Admitting: Gastroenterology

## 2020-05-01 DIAGNOSIS — E60 Dietary zinc deficiency: Secondary | ICD-10-CM

## 2020-05-02 ENCOUNTER — Other Ambulatory Visit: Payer: Self-pay | Admitting: Gastroenterology

## 2020-05-02 DIAGNOSIS — R188 Other ascites: Secondary | ICD-10-CM

## 2020-05-02 DIAGNOSIS — K7030 Alcoholic cirrhosis of liver without ascites: Secondary | ICD-10-CM

## 2020-05-03 ENCOUNTER — Other Ambulatory Visit: Payer: Self-pay

## 2020-05-03 ENCOUNTER — Ambulatory Visit (HOSPITAL_COMMUNITY)
Admission: RE | Admit: 2020-05-03 | Discharge: 2020-05-03 | Disposition: A | Discharge status: Home / Self Care | Payer: BC Managed Care – PPO | Source: Ambulatory Visit | Attending: Gastroenterology | Admitting: Gastroenterology

## 2020-05-03 DIAGNOSIS — K7031 Alcoholic cirrhosis of liver with ascites: Secondary | ICD-10-CM | POA: Diagnosis present

## 2020-05-03 DIAGNOSIS — K7030 Alcoholic cirrhosis of liver without ascites: Secondary | ICD-10-CM

## 2020-05-03 DIAGNOSIS — R188 Other ascites: Secondary | ICD-10-CM

## 2020-05-03 HISTORY — PX: IR PARACENTESIS: IMG2679

## 2020-05-03 IMAGING — US IR PARACENTESIS
1 series · 7 of 7 positions shown · non-contrast
Comparison: none

INDICATION: Patient with history of NASH cirrhosis and recurrent ascites.
Request is made for diagnostic and therapeutic paracentesis up to 8

[Series 1: ir (id) (id)/(id)/(id) ir · 7 of 7 slices shown]
[im 1/7]
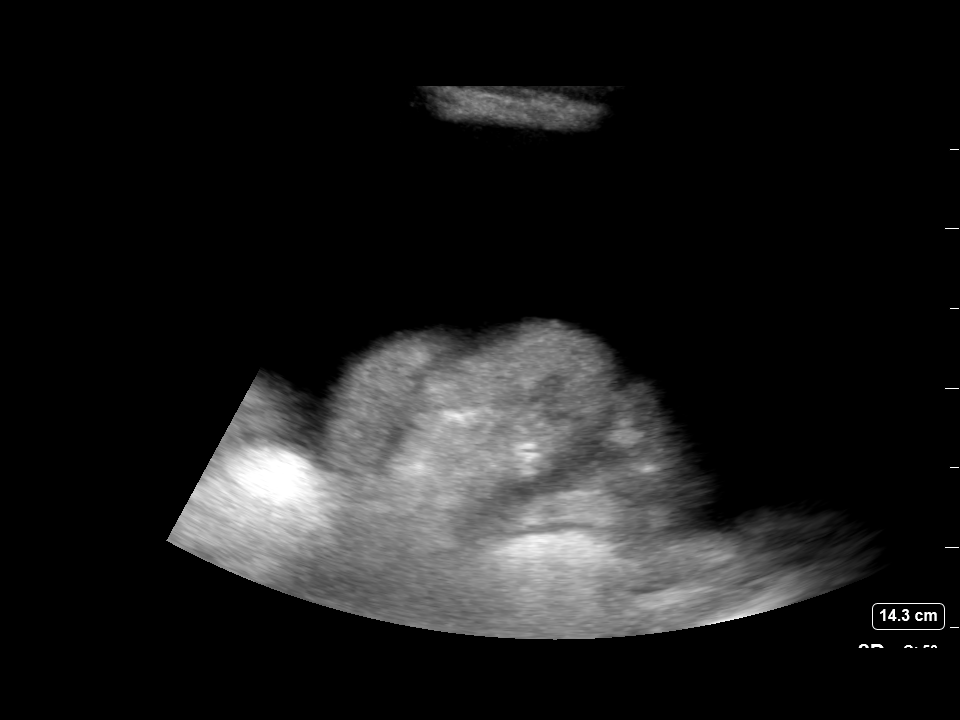
[im 2/7]
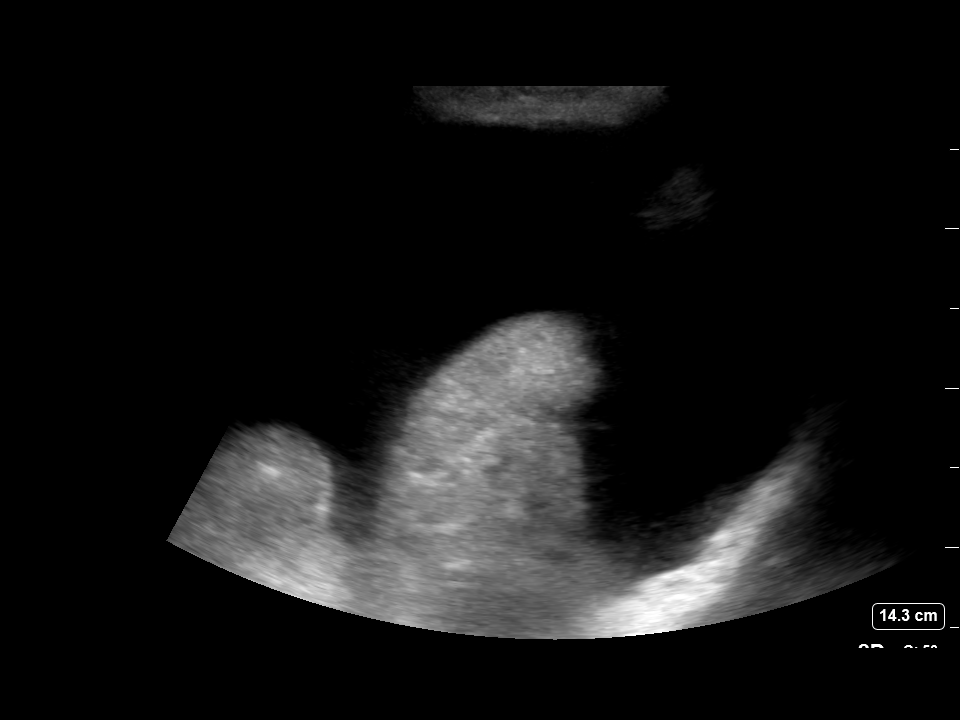
[im 3/7]
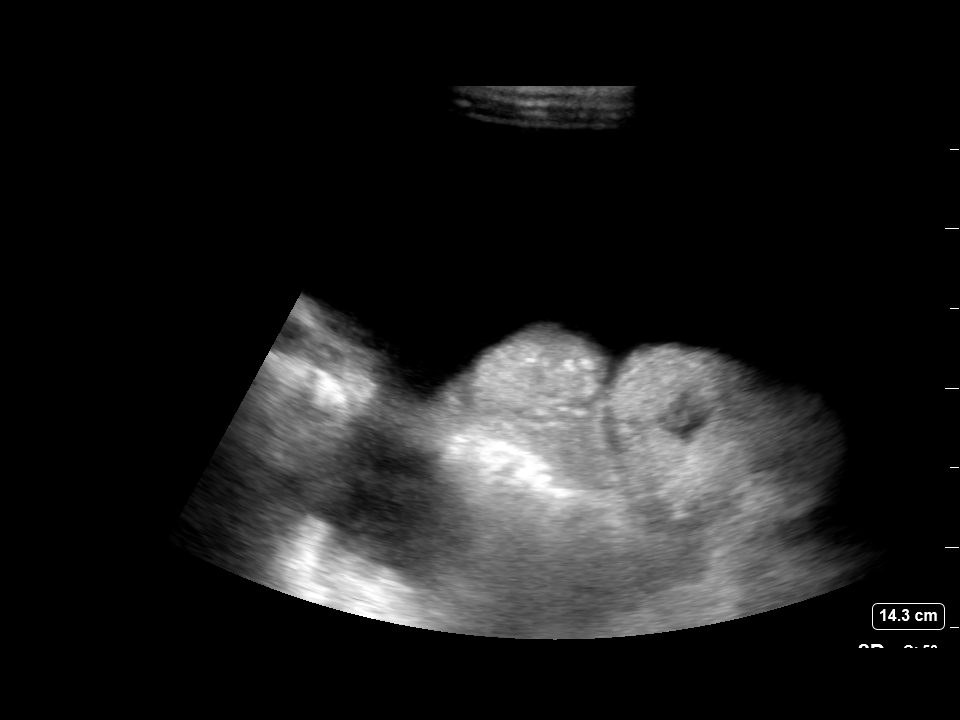
[im 4/7]
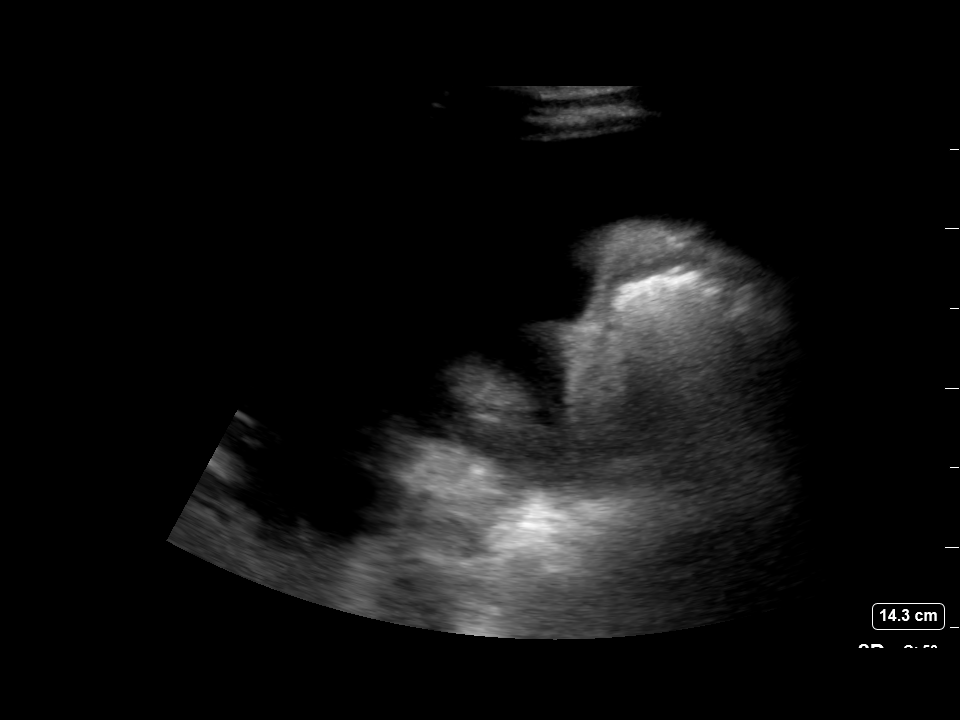
[im 5/7]
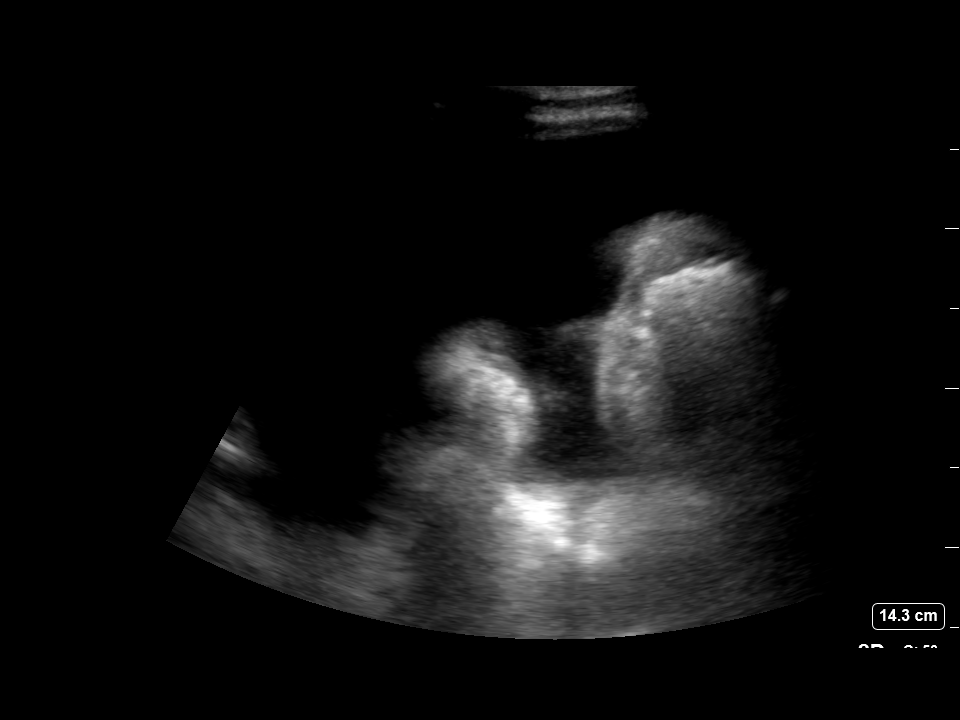
[im 6/7]
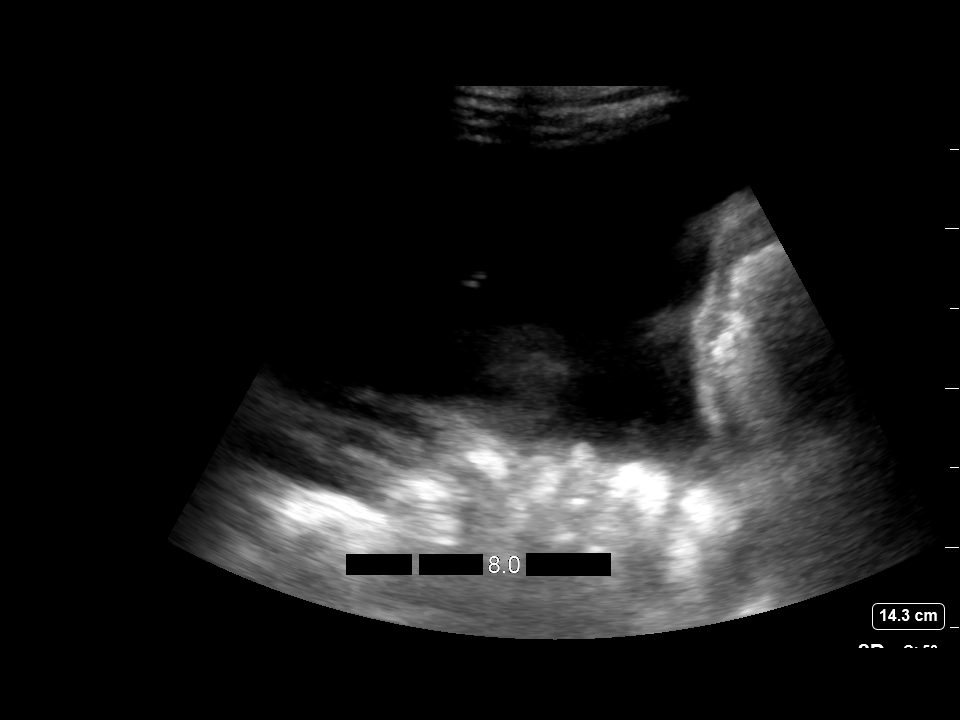
[im 7/7]
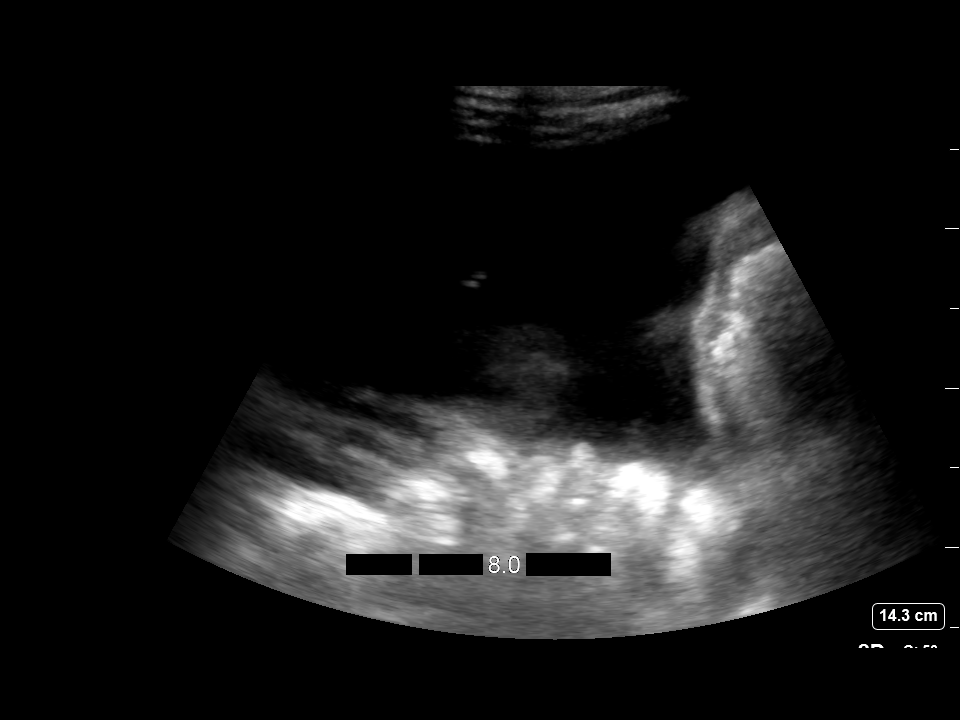

[7 of 7 positions shown; findings below may reference images not displayed]

EXAM:
ULTRASOUND GUIDED DIAGNOSTIC AND THERAPEUTIC PARACENTESIS

MEDICATIONS:
10 mL 1% lidocaine

COMPLICATIONS:
None immediate.

PROCEDURE:
Informed written consent was obtained from the patient after a
discussion of the risks, benefits and alternatives to treatment. A
timeout was performed prior to the initiation of the procedure.

Initial ultrasound scanning demonstrates a large amount of ascites
within the right lower abdominal quadrant. The right lower abdomen
was prepped and draped in the usual sterile fashion. 1% lidocaine
was used for local anesthesia.

Following this, a 19 gauge, 7-cm, Yueh catheter was introduced. An
ultrasound image was saved for documentation purposes. The
paracentesis was performed. The catheter was removed and a dressing
was applied. The patient tolerated the procedure well without
immediate post procedural complication.
Patient received post-procedure intravenous albumin; see nursing
notes for details.
FINDINGS: A total of approximately 8.0 L of hazy yellow fluid was removed.
Samples were sent to the laboratory as requested by the clinical
team.
IMPRESSION: Successful ultrasound-guided paracentesis yielding 8.0 L of
peritoneal fluid.

## 2020-05-03 MED ORDER — LIDOCAINE HCL 1 % IJ SOLN
INTRAMUSCULAR | Status: AC
  Filled 2020-05-03: qty 20

## 2020-05-03 MED ORDER — ALBUMIN HUMAN 25 % IV SOLN
50.0000 g | Freq: Once | INTRAVENOUS | Status: AC
  Administered 2020-05-03: 50 g via INTRAVENOUS

## 2020-05-03 MED ORDER — ALBUMIN HUMAN 25 % IV SOLN
INTRAVENOUS | Status: AC
  Filled 2020-05-03: qty 200

## 2020-05-03 MED ORDER — LIDOCAINE HCL 1 % IJ SOLN
INTRAMUSCULAR | Status: DC | PRN
  Administered 2020-05-03: 10 mL

## 2020-05-03 NOTE — Procedures (Signed)
PROCEDURE SUMMARY:  Successful image-guided paracentesis from the right lateral abdomen.  Yielded 8.0 liters of hazy yellow fluid.  No immediate complications.  EBL < 2 mL. Patient tolerated well.   Specimen was sent for labs.  Please see imaging section of Epic for full dictation.   Claris Pong Tonyia Marschall PA-C 05/03/2020 9:13 AM

## 2020-05-06 ENCOUNTER — Ambulatory Visit (HOSPITAL_COMMUNITY): Payer: BC Managed Care – PPO

## 2020-05-06 LAB — BODY FLUID CELL COUNT WITH DIFFERENTIAL
Eos, Fluid: 0 %
Lymphs, Fluid: 97 %
Monocyte-Macrophage-Serous Fluid: 5 % — ABNORMAL LOW (ref 50–90)
Neutrophil Count, Fluid: 3 % (ref 0–25)
Total Nucleated Cell Count, Fluid: 89 cu mm (ref 0–1000)

## 2020-05-06 LAB — PATHOLOGIST SMEAR REVIEW

## 2020-05-09 ENCOUNTER — Ambulatory Visit
Admission: RE | Admit: 2020-05-09 | Discharge: 2020-05-09 | Disposition: A | Discharge status: Home / Self Care | Payer: BC Managed Care – PPO | Source: Ambulatory Visit | Attending: Interventional Radiology | Admitting: Interventional Radiology

## 2020-05-09 ENCOUNTER — Ambulatory Visit: Payer: BC Managed Care – PPO | Admitting: Dietician

## 2020-05-09 ENCOUNTER — Encounter: Payer: Self-pay | Admitting: *Deleted

## 2020-05-09 DIAGNOSIS — K7581 Nonalcoholic steatohepatitis (NASH): Secondary | ICD-10-CM

## 2020-05-09 DIAGNOSIS — R188 Other ascites: Secondary | ICD-10-CM

## 2020-05-09 DIAGNOSIS — K7460 Unspecified cirrhosis of liver: Secondary | ICD-10-CM

## 2020-05-09 HISTORY — PX: IR RADIOLOGIST EVAL & MGMT: IMG5224

## 2020-05-09 IMAGING — US US ART/VEN ABD/PELV/SCROTUM DOPPLER COMPLETE
1 series · 13 of 25 positions shown · non-contrast
Comparison: Prior tips duplex ultrasound [DATE]

CLINICAL DATA: 53-year-old male with a history of NASH cirrhosis
and recurrent large volume ascites. He underwent tips creation on
[DATE] and tips revision on [DATE].

EXAM:
DUPLEX ULTRASOUND OF LIVER AND TIPS SHUNT
TECHNIQUE: Color and duplex Doppler ultrasound was performed to evaluate the
hepatic in-flow and out-flow vessels.

[Series 1: us art/ven abd/pelv/scrotum doppler complete · 0.23mm/px · 13 of 68 slices shown]
[im 1/68]
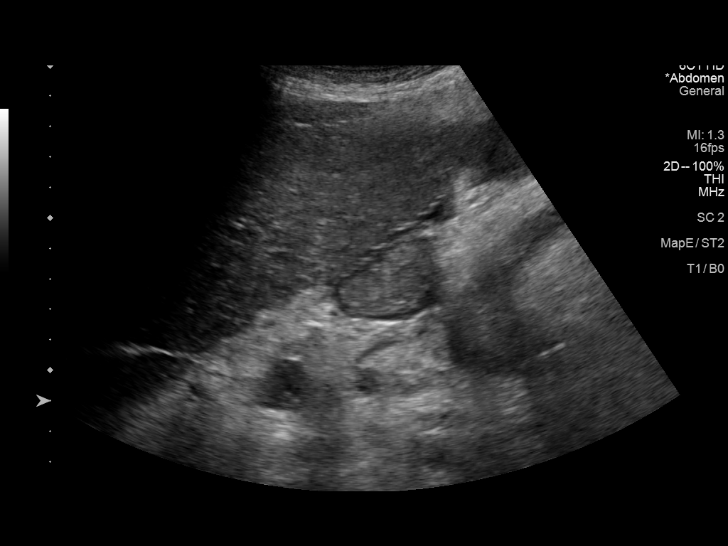
[im 6/68]
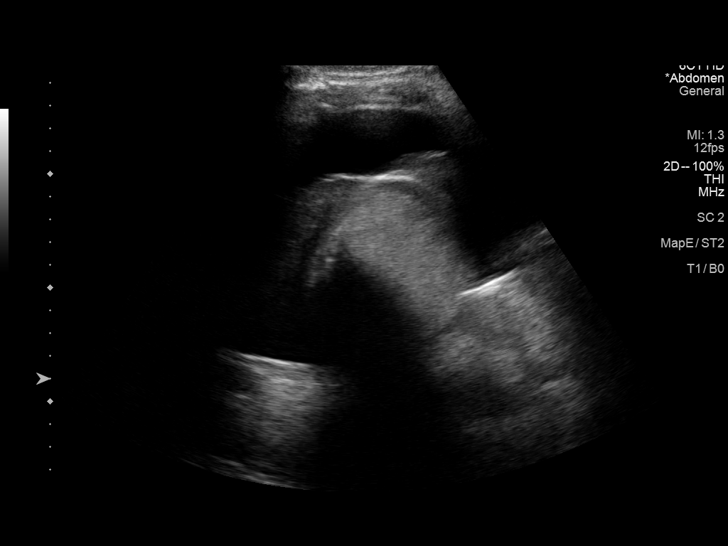
[im 12/68]
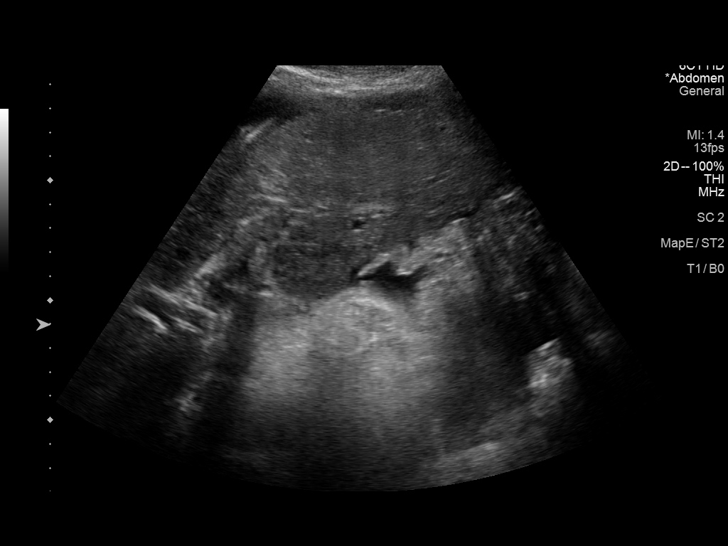
[im 17/68]
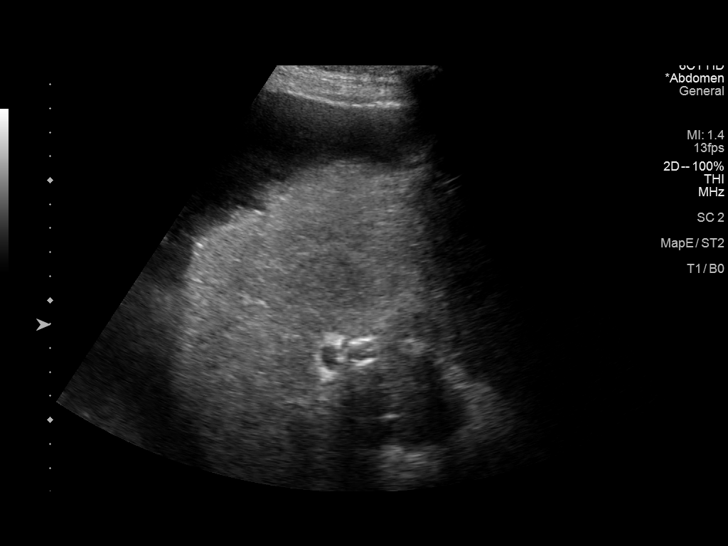
[im 23/68]
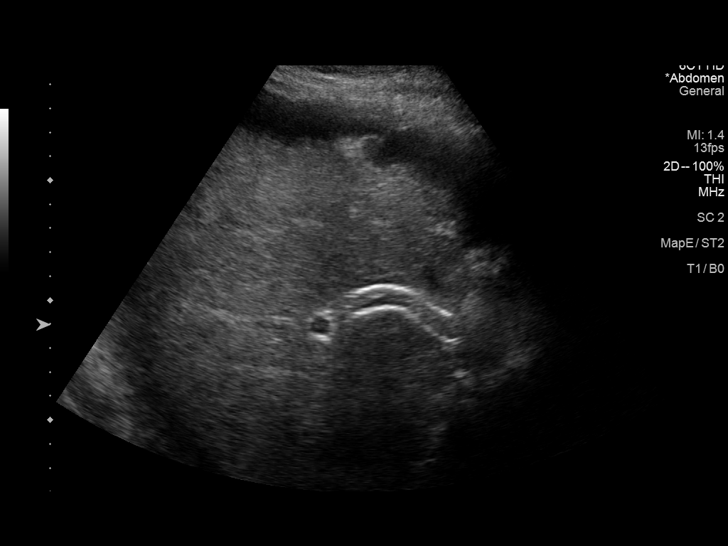
[im 28/68]
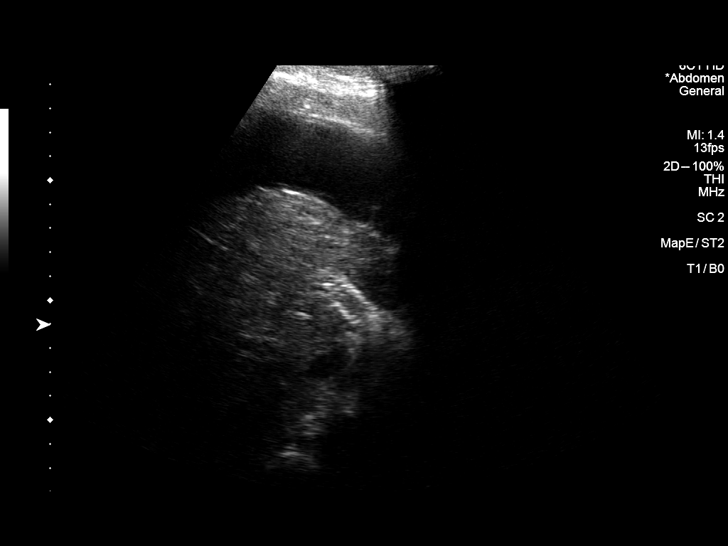
[im 34/68]
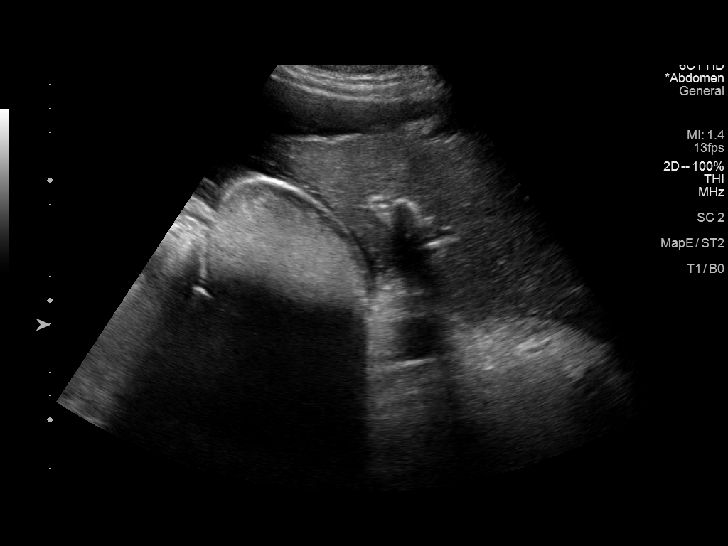
[im 40/68]
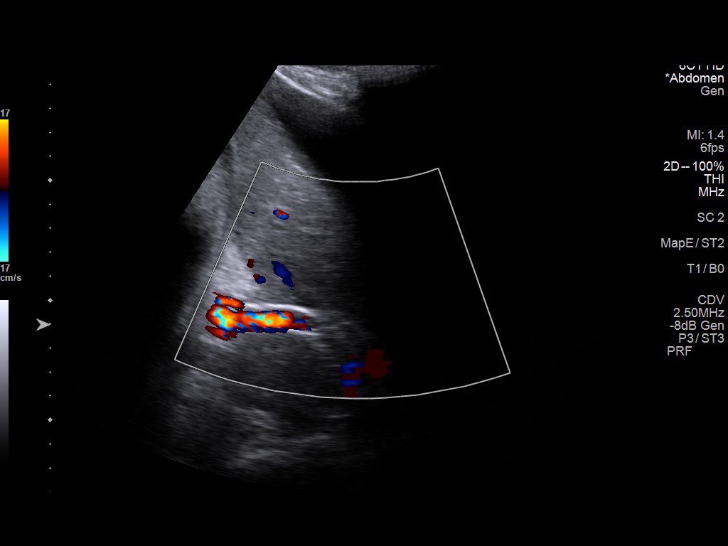
[im 45/68]
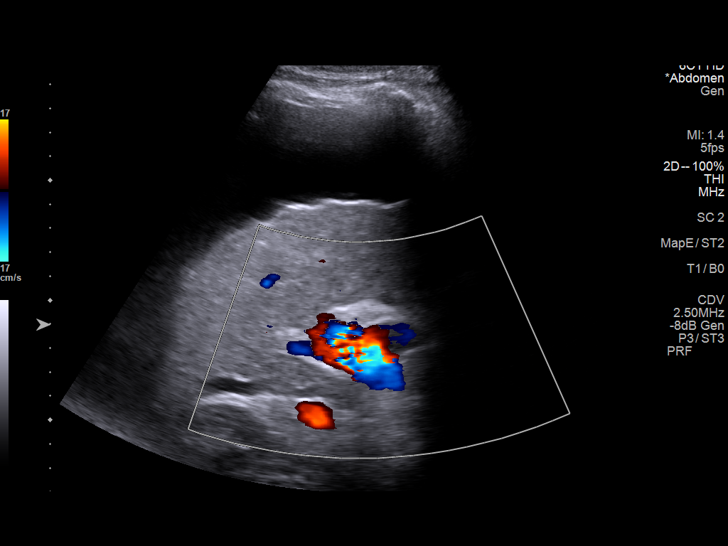
[im 51/68]
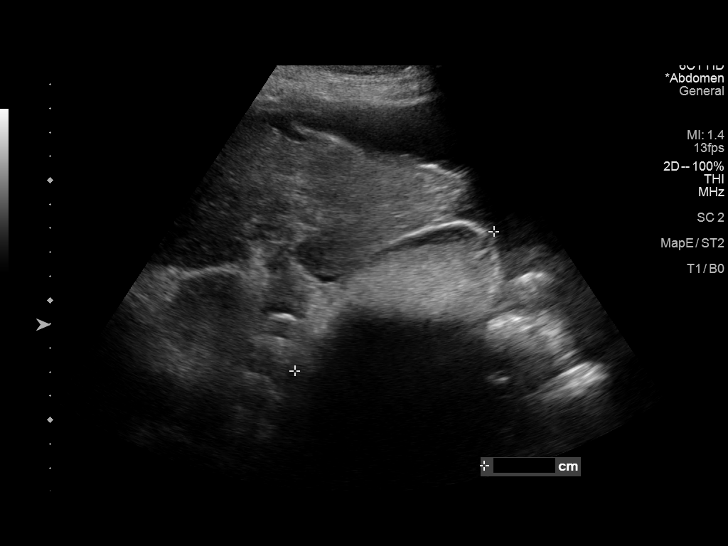
[im 56/68]
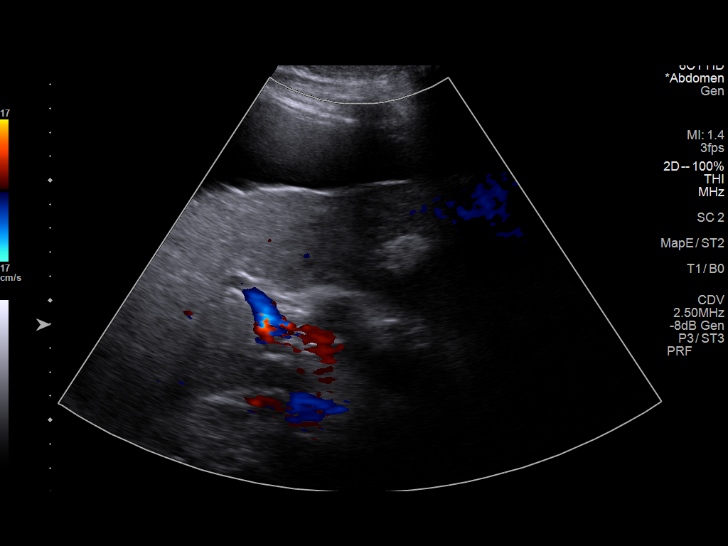
[im 62/68]
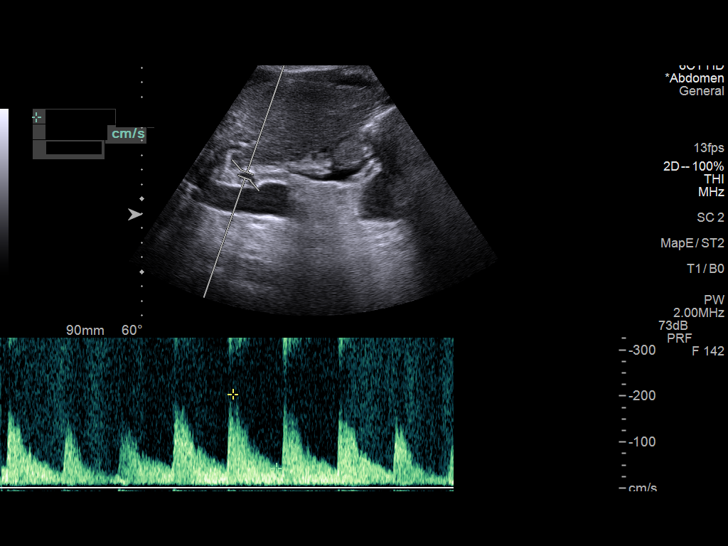
[im 68/68]
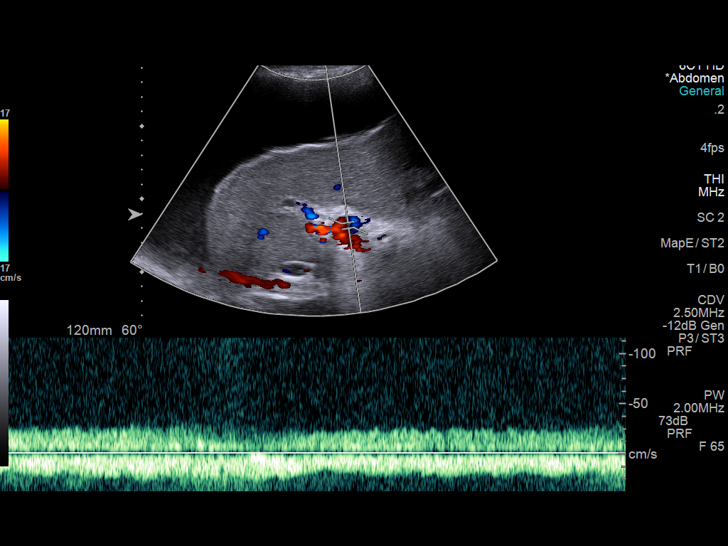

[13 of 25 positions shown; findings below may reference images not displayed]

FINDINGS: Portal Vein Velocities

Main:  89 cm/sec

Right:  54 cm/sec

Left:  63 cm/sec

TIPS Stent Velocities

Proximal:  269 cm/sec

Mid:  323

Distal:  350 cm/sec

IVC: Present and patent with normal respiratory phasicity.

Hepatic Vein Velocities

Right: Not visualized

Mid:  76 cm/sec

Left: Not visualized

Splenic Vein: 83 cm per second

Superior Mesenteric Vein: 28 cm per second

Hepatic Artery: 158 centimeters/second

Ascites: Present/Absent

Varices: Present/Absent

Other findings: Moderate volume ascites. Small, heterogeneous and
nodular liver consistent with known cirrhosis. The gallbladder is
distended and filled with internal sludge and/or small stones.
IMPRESSION: 1. Patent tips with relatively high velocities throughout.
2. Moderate ascites.
3. Hepatic cirrhosis.

## 2020-05-09 NOTE — Progress Notes (Signed)
Interventional radiology progress note   Chief Complaint: Patient was seen in follow-up today at the request of [place].    Referring Physician(s): Dr. Gerrit Heck  History of Present Illness: Martin Strong is a 54 y.o. male with nonalcoholic steatohepatitis related cirrhosis initially diagnosed in February 1517 and complicated by bleeding esophageal varices in October 2019 which was successfully treated by endoscopic banding and propranolol for dual prophylaxis. Additionally, patient began to develop decompensation and large-volume ascites in October 2020 requiring large-volume paracentesis with removal of 5 L of ascites and initiation of oral diuretic therapy.  He subsequently required additional paracentesis on 10/24/2019 with removal of 7 L of ascites, and again on 11/16/2019 with removal of 7 L of ascites.He was initially referred to me in January to evaluate for possible TIPS creation. At that time, we proceeded with standard work-up including echocardiography. Unfortunately, in February he developed spontaneous bacterial peritonitis and acute kidney injury. His diuretics were subsequently discontinued. He has since recovered and is on ciprofloxacin 750 mg weekly for SBP prophylaxis.  He underwent elective TIPS creation on 03/12/2020. There was concern that there was no flow within the TIPS stent on his initial follow-up TIPS duplex ultrasound performed on 04/09/2020.  Therefore, he underwent TIPS revision on 04/11/2020.  Fortunately, the TIPS stent was patent.  However, at that time it was increased to 10 mm in diameter with fairly minimal change in his underlying portal pressure which remained elevated at greater than 12 mmHg.  Today, Martin Strong presents for follow-up evaluation.  He continues to require large-volume paracentesis.  On 04/24/2020 he had 6 L removed, and on 05/03/2020 he had 8 L removed.  Overall, he feels similar compared to before having the TIPS  procedure done.  It is difficult for him to tell if the fluid is accumulating any less quickly.  He described to me that he does have intermittent episodes of hand tremors, particularly when he is holding something like a plate of food.  The tremors happen perhaps 1 or 2 days/week.  He does not have any tremors when not holding something.  He denies episodes of confusion or loss of balance.  He is compliant with his lactulose and having multiple soft stools daily.  Additionally, he reports that his Lasix dose was recently increased to 80 mg.  He denies abdominal pain, fever, chills or loss of appetite.  Past Medical History:  Diagnosis Date  . Anemia   . Arthritis   . Cirrhosis ([place])   . Diabetes mellitus without complication ([place])   . Dyspnea   . Fatty liver   . GERD (gastroesophageal reflux disease)   . GI bleed   . Hyperlipidemia   . Hypertension     Past Surgical History:  Procedure Laterality Date  . BIOPSY  02/14/2019   Procedure: BIOPSY;  Surgeon: Lavena Bullion, DO;  Location: [facility];  Service: Gastroenterology;;  . BIOPSY  07/22/2019   Procedure: BIOPSY;  Surgeon: Lavena Bullion, DO;  Location: [facility];  Service: Gastroenterology;;  . ESOPHAGEAL BANDING  10/07/2018   Procedure: ESOPHAGEAL BANDING;  Surgeon: Lavena Bullion, DO;  Location: [place];  Service: Gastroenterology;;  . ESOPHAGEAL BANDING N/A 01/03/2019   Procedure: ESOPHAGEAL BANDING;  Surgeon: Lavena Bullion, DO;  Location: [facility];  Service: Gastroenterology;  Laterality: N/A;  . ESOPHAGEAL BANDING  02/14/2019   Procedure: ESOPHAGEAL BANDING;  Surgeon: Lavena Bullion, DO;  Location: [facility];  Service: Gastroenterology;;  . esophageal bands    .  ESOPHAGOGASTRODUODENOSCOPY Left 07/22/2019   Procedure: ESOPHAGOGASTRODUODENOSCOPY (EGD) WITH POSSIBLE BANDING;  Surgeon: Lavena Bullion, DO;  Location: [facility];  Service: Gastroenterology;  Laterality: Left;  .  ESOPHAGOGASTRODUODENOSCOPY (EGD) WITH PROPOFOL N/A 10/07/2018   Procedure: ESOPHAGOGASTRODUODENOSCOPY (EGD) WITH PROPOFOL;  Surgeon: Lavena Bullion, DO;  Location: [place];  Service: Gastroenterology;  Laterality: N/A;  . ESOPHAGOGASTRODUODENOSCOPY (EGD) WITH PROPOFOL N/A 01/03/2019   Procedure: ESOPHAGOGASTRODUODENOSCOPY (EGD) WITH PROPOFOL;  Surgeon: Lavena Bullion, DO;  [facility];  Service: Gastroenterology;  Laterality: N/A;  . ESOPHAGOGASTRODUODENOSCOPY (EGD) WITH PROPOFOL N/A 02/14/2019   Procedure: ESOPHAGOGASTRODUODENOSCOPY (EGD) WITH PROPOFOL;  Surgeon: Lavena Bullion, DO;  [facility];  Service: Gastroenterology;  Laterality: N/A;  . IR PARACENTESIS  10/05/2019  . IR PARACENTESIS  10/24/2019  . IR PARACENTESIS  11/16/2019  . IR PARACENTESIS  12/25/2019  . IR PARACENTESIS  01/05/2020  . IR PARACENTESIS  01/23/2020  . IR PARACENTESIS  02/08/2020  . IR PARACENTESIS  02/16/2020  . IR PARACENTESIS  02/28/2020  . IR PARACENTESIS  03/12/2020  . IR PARACENTESIS  04/04/2020  . IR PARACENTESIS  04/11/2020  . IR PARACENTESIS  04/24/2020  . IR PARACENTESIS  05/03/2020  . IR RADIOLOGIST EVAL & MGMT  12/19/2019  . IR RADIOLOGIST EVAL & MGMT  02/27/2020  . IR RADIOLOGIST EVAL & MGMT  04/09/2020  . IR TIPS  03/12/2020  . IR TIPS REVISION MOD SED  04/11/2020  . RADIOLOGY WITH ANESTHESIA N/A 03/12/2020   Procedure: TIPS;  Surgeon: Jacqulynn Cadet, MD;  Location: [place];  Service: Radiology;  Laterality: N/A;    Allergies: Nadolol  Medications: Prior to Admission medications   Medication Sig Start Date End Date Taking? Authorizing Provider  CALCIUM PO Take 750 mg by mouth daily.    [provider]  cholecalciferol (VITAMIN D) 1000 units tablet Take 1,000 Units by mouth 2 (two) times daily.    [provider]  ciprofloxacin (CIPRO) 500 MG tablet Take Cipro 750 mg weekly Patient taking differently: Take 500 mg by mouth every Monday.  02/26/20    Cirigliano, Vito V, DO  dapagliflozin propanediol (FARXIGA) 10 MG TABS tablet Take 10 mg by mouth at bedtime.    [provider]  feeding supplement, ENSURE ENLIVE, (ENSURE ENLIVE) LIQD Take 237 mLs by mouth 2 (two) times daily between meals. 01/28/20   Hosie Poisson, MD  ferrous sulfate (KP FERROUS SULFATE) 325 (65 FE) MG tablet Take 1 tablet (325 mg total) by mouth daily with breakfast. Patient taking differently: Take 325 mg by mouth 2 (two) times daily.  05/30/19   Cirigliano, Vito V, DO  furosemide (LASIX) 40 MG tablet Take 40 mg by mouth daily.    [provider]  glipiZIDE (GLUCOTROL XL) 10 MG 24 hr tablet Take 10 mg by mouth 2 (two) times daily.    [provider]  lactulose (CHRONULAC) 10 GM/15ML solution Take 30 mLs (20 g total) by mouth 3 (three) times daily. 03/13/20   Ardis Rowan, PA-C  metFORMIN (GLUCOPHAGE) 1000 MG tablet Take 1,000 mg by mouth 2 (two) times daily with a meal.    [provider]  Multiple Vitamin (MULTIVITAMIN WITH MINERALS) TABS tablet Take 1 tablet by mouth at bedtime.    [provider]  potassium chloride (KLOR-CON) 10 MEQ tablet Take 10 mEq by mouth daily.    [provider]  propranolol (INDERAL) 40 MG tablet Take 0.5 tablets (20 mg total) by mouth 2 (two) times daily. Patient  taking differently: Take 40 mg by mouth 2 (two) times daily.  01/27/20   Hosie Poisson, MD     Family History  Problem Relation Age of Onset  . Diabetes Other   . Esophageal cancer Father   . Colon cancer Neg Hx   . Rectal cancer Neg Hx   . Stomach cancer Neg Hx     Social History   Socioeconomic History  . Marital status: Married    Spouse name: Not on file  . Number of children: 2  . Years of education: Not on file  . Highest education level: Not on file  Occupational History  . Not on file  Tobacco Use  . Smoking status: Never Smoker  . Smokeless tobacco: Never Used  Substance and Sexual Activity  . Alcohol  use: Not Currently  . Drug use: Never  . Sexual activity: Not on file  Other Topics Concern  . Not on file  Social History Narrative  . Not on file   Social Determinants of Health   Financial Resource Strain:   . Difficulty of Paying Living Expenses:   Food Insecurity:   . Worried About Charity fundraiser in the Last Year:   . Arboriculturist in the Last Year:   Transportation Needs:   . Film/video editor (Medical):   Marland Kitchen Lack of Transportation (Non-Medical):   Physical Activity:   . Days of Exercise per Week:   . Minutes of Exercise per Session:   Stress:   . Feeling of Stress :   Social Connections:   . Frequency of Communication with Friends and Family:   . Frequency of Social Gatherings with Friends and Family:   . Attends Religious Services:   . Active Member of Clubs or Organizations:   . Attends Archivist Meetings:   Marland Kitchen Marital Status:      Review of Systems  Review of Systems: A 12 point ROS discussed and pertinent positives are indicated in the HPI above.  All other systems are negative.  Physical Exam Constitutional:      Appearance: Normal appearance.  HENT:     Head: Normocephalic and atraumatic.  Eyes:     General: No scleral icterus. Cardiovascular:     Rate and Rhythm: Normal rate.  Pulmonary:     Effort: Pulmonary effort is normal.  Abdominal:     General: There is distension.     Tenderness: There is no abdominal tenderness.  Skin:    General: Skin is warm and dry.     Coloration: Skin is not jaundiced.  Neurological:     Mental Status: He is alert and oriented to person, place, and time.     Comments: Negative for asterixis  Psychiatric:        Mood and Affect: Mood normal.        Behavior: Behavior normal.     Vital Signs: There were no vitals taken for this visit.  Imaging: IR TIPS REVISION MOD SED  Result Date: 04/12/2020 CLINICAL DATA:  54 year old male with a history of cryptogenic cirrhosis complicated by  recurrent large volume ascites. EXAM: Tips revision MEDICATIONS: 2 g Ancef ANESTHESIA/SEDATION: 1 mg Versed 50 mcg fentanyl administered intravenously. Moderate Sedation Time:  44 minutes The patient was continuously monitored during the procedure by the interventional radiology nurse under my direct supervision. CONTRAST:  70 mL Isovue 300 FLUOROSCOPY TIME:  Fluoroscopy Time: 8 minutes 18 seconds (586 mGy). COMPLICATIONS: None immediate. PROCEDURE: Informed written consent  was obtained from the patient after a thorough discussion of the procedural risks, benefits and alternatives. All questions were addressed. Maximal Sterile Barrier Technique was utilized including caps, mask, sterile gowns, sterile gloves, sterile drape, hand hygiene and skin antiseptic. A timeout was performed prior to the initiation of the procedure. The right internal jugular vein was interrogated with ultrasound and found to be widely patent. An image was obtained and stored for the medical record. Local anesthesia was attained by infiltration with 1% lidocaine. A small dermatotomy was made. Under real-time sonographic guidance, the vessel was punctured with a 21 gauge micropuncture needle. Using standard technique, the initial micro needle was exchanged over a 0.018 micro wire for a transitional 4 [place] micro sheath. The micro sheath was then exchanged over a 0.035 wire for a 5 French vascular sheath. A Bentson wire was carefully advanced through the right atrium and into the inferior vena cava. The 5 French sheath was removed. The skin tract was dilated to 11 [place]. A 10 French tips sheath was then advanced over the wire into the inferior vena cava. An MPA catheter was advanced over the Bentson wire. The tips sheath was pulled back into the right atrium. The MPA catheter was carefully pulled back and used to select the middle hepatic vein adjacent to the tips stent. A rose in wire was placed in the middle hepatic vein. The tips sheath was  then advanced and torqued until it was directed toward the tips stent. The MPA catheter was reintroduced through the tips sheath adjacent to the rose in wire and used to successfully catheterize the tips stent graft. The Bentson wire was exchanged for a J wire which was then advanced through the stent graft and into the portal vein. The J wire was utilized to ensure that the wire did not pass through the interstices of the uncovered portion of the Viatorr. A pigtail catheter was advanced in the main portal vein. Portal venography was performed. The stent graft appears to be patent. The graft is not occluded as was suspected on the recent duplex ultrasound. However, flow does appear to be slow. The pigtail catheter was removed over an Amplatz wire. A 10 x 40 mm mustang balloon was introduced over the wire and the stent graft was angioplasty to 10 mm. The balloon was inflated to full effacement. The balloon was then exchanged for the pigtail catheter in additional portal venography was performed. The tips is widely patent with excellent flow. Portal venous pressures were measured and found to be quite high at 25 mm Hg. The catheter was removed over a wire. The tips sheath was removed. Hemostasis was attained by manual pressure. IMPRESSION: 1. Successful revision of TIPS to 10 mm. The Viatorr stent graft is widely patent. 2. Persistent severe portal hypertension despite widely patent and well positioned TIPS. If patient continues to develop recurrent large volume ascites, consideration could be given for parallel TIPS placement. Patient will be admitted to the hospitalist service for overnight observation. Outpatient follow-up with a TIPS ultrasound in 1 month. Signed, Criselda Peaches, MD, [facility] Radiology Electronically Signed   By: Jacqulynn Cadet M.D.   On: 04/12/2020 02:45   US ABDOMINAL PELVIC ART/VENT FLOW DOPPLER  Result Date:  05/09/2020 CLINICAL DATA:  54 year old male with a history of NASH cirrhosis and recurrent large volume ascites. He underwent tips creation on 03/12/2020 and tips revision on 04/11/2020. EXAM: DUPLEX ULTRASOUND OF LIVER AND TIPS SHUNT TECHNIQUE: Color and  duplex Doppler ultrasound was performed to evaluate the hepatic in-flow and out-flow vessels. COMPARISON:  Prior tips duplex ultrasound 04/09/2020 FINDINGS: Portal Vein Velocities Main:  89 cm/sec Right:  54 cm/sec Left:  63 cm/sec TIPS Stent Velocities Proximal:  269 cm/sec Mid:  323 Distal:  350 cm/sec IVC: Present and patent with normal respiratory phasicity. Hepatic Vein Velocities Right: Not visualized Mid:  76 cm/sec Left: Not visualized Splenic Vein: 83 cm per second Superior Mesenteric Vein: 28 cm per second Hepatic Artery: 158 centimeters/second Ascites: Present/Absent Varices: Present/Absent Other findings: Moderate volume ascites. Small, heterogeneous and nodular liver consistent with known cirrhosis. The gallbladder is distended and filled with internal sludge and/or small stones. IMPRESSION: 1. Patent tips with relatively high velocities throughout. 2. Moderate ascites. 3. Hepatic cirrhosis. Signed, Criselda Peaches, MD, [facility] Radiology Electronically Signed   By: Jacqulynn Cadet M.D.   On: 05/09/2020 11:04   IR Paracentesis  Result Date: 05/03/2020 INDICATION: Patient with history of NASH cirrhosis and recurrent ascites. Request is made for diagnostic and therapeutic paracentesis up to 8 L. EXAM: ULTRASOUND GUIDED DIAGNOSTIC AND THERAPEUTIC PARACENTESIS MEDICATIONS: 10 mL 1% lidocaine COMPLICATIONS: None immediate. PROCEDURE: Informed written consent was obtained from the patient after a discussion of the risks, benefits and alternatives to treatment. A timeout was performed prior to the initiation of the procedure. Initial ultrasound scanning demonstrates a large amount of ascites  within the right lower abdominal quadrant. The right lower abdomen was prepped and draped in the usual sterile fashion. 1% lidocaine was used for local anesthesia. Following this, a 19 gauge, 7-cm, Yueh catheter was introduced. An ultrasound image was saved for documentation purposes. The paracentesis was performed. The catheter was removed and a dressing was applied. The patient tolerated the procedure well without immediate post procedural complication. Patient received post-procedure intravenous albumin; see nursing notes for details. FINDINGS: A total of approximately 8.0 L of hazy yellow fluid was removed. Samples were sent to the laboratory as requested by the clinical team. IMPRESSION: Successful ultrasound-guided paracentesis yielding 8.0 L of peritoneal fluid. Read by: Earley Abide, PA-C Electronically Signed   By: Corrie Mckusick D.O.   On: 05/03/2020 10:04   IR Paracentesis  Result Date: 04/24/2020 INDICATION: Recurrent ascites EXAM: ULTRASOUND GUIDED RLQ PARACENTESIS MEDICATIONS: 10 cc 1% lidocaine COMPLICATIONS: None immediate. PROCEDURE: Informed written consent was obtained from the patient after a discussion of the risks, benefits and alternatives to treatment. A timeout was performed prior to the initiation of the procedure. Initial ultrasound scanning demonstrates a large amount of ascites within the right lower abdominal quadrant. The right lower abdomen was prepped and draped in the usual sterile fashion. 1% lidocaine was used for local anesthesia. Following this, a 10 G Yueh catheter was introduced. An ultrasound image was saved for documentation purposes. The paracentesis was performed. The catheter was removed and a dressing was applied. The patient tolerated the procedure well without immediate post procedural complication. Patient received post-procedure intravenous albumin; see nursing notes for details. FINDINGS: A total of approximately 6 liters of cloudy yellow fluid was removed.  Samples were sent to the laboratory as requested by the clinical team. IMPRESSION: Successful ultrasound-guided paracentesis yielding 6 liters of peritoneal fluid. Read by Lavonia Drafts [facility] Electronically Signed   By: Lucrezia Europe M.D.   On: 04/24/2020 15:55   IR Paracentesis  Result Date: 04/11/2020 INDICATION: Patient with history of non-alcoholic steatohepatitis related cirrhosis with ascites. IR asked to perform therapeutic paracentesis  prior to scheduled TIPS revision today in IR. EXAM: ULTRASOUND GUIDED left PARACENTESIS MEDICATIONS: 1% lidocaine, 10 ml COMPLICATIONS: None immediate. PROCEDURE: Informed written consent was obtained from the patient after a discussion of the risks, benefits and alternatives to treatment. A timeout was performed prior to the initiation of the procedure. Initial ultrasound scanning demonstrates a large amount of ascites within the left lower abdominal quadrant. The left lower abdomen was prepped and draped in the usual sterile fashion. 1% lidocaine was used for local anesthesia. Following this, a 19 gauge, 7-cm, Yueh catheter was introduced. An ultrasound image was saved for documentation purposes. The paracentesis was performed. The catheter was removed and a dressing was applied. The patient tolerated the procedure well without immediate post procedural complication. FINDINGS: A total of approximately 4 of hazy yellow fluid was removed. IMPRESSION: Successful ultrasound-guided paracentesis yielding 4 liters of peritoneal fluid. Read by: Soyla Dryer, NP Electronically Signed   By: Jacqulynn Cadet M.D.   On: 04/11/2020 17:15    Labs:  CBC: Recent Labs    03/13/20 0143 04/04/20 1109 04/11/20 1258 04/26/20 1043  WBC 4.7 6.1 6.3 8.5  HGB 7.1* 9.4* 9.5* 11.0*  HCT 22.4* 29.1* 30.1* 31.9*  PLT 106* 135* 154 160.0    COAGS: Recent Labs    01/23/20 1938 02/08/20 1132 03/12/20 0730 03/13/20 0143 04/04/20 1109 04/11/20 1258  INR 1.5*   < > 1.4* 1.6*  1.2* 1.3*  APTT 43*  --   --   --   --   --    < > = values in this interval not displayed.    BMP: Recent Labs    03/12/20 0730 03/12/20 0730 03/13/20 0143 04/04/20 1109 04/11/20 1258 04/26/20 1043  NA 138   < > 137 143 141 138  K 3.4*   < > 3.4* 3.6 3.3* 3.3*  CL 98   < > 101 109 104 102  CO2 27   < > 27 25 26 30   GLUCOSE 120*   < > 188* 90 147* 186*  BUN 18   < > 18 13 11 11   CALCIUM 8.9   < > 8.3* 8.5* 8.5* 8.1*  CREATININE 1.04   < > 1.04 0.68* 0.88 0.69  GFRNONAA >60  --  >60 110 >60  --   GFRAA >60  --  >60 127 >60  --    < > = values in this interval not displayed.    LIVER FUNCTION TESTS: Recent Labs    03/07/20 1155 03/07/20 1155 03/12/20 0730 03/13/20 0143 04/04/20 1109 04/11/20 1258  BILITOT 1.4*   < > 1.2 0.9 1.3* 1.3*  AST 41   < > 36 37 42* 44*  ALT 27   < > 29 26 29  36  ALKPHOS 70  --  69 50  --  73  PROT 8.1   < > 7.7 5.9* 6.9 7.1  ALBUMIN 3.2*  --  3.2* 2.9*  --  2.7*   < > = values in this interval not displayed.    TUMOR MARKERS: No results for input(s): AFPTM, CEA, CA199, CHROMGRNA in the last 8760 hours.  Assessment and Plan:  54 year old male with Martin Strong cirrhosis complicated by refractory large-volume ascites which has been resistant to both diuretics and initial TIPS placement with dilation of the stent 8 mm.  TIPS revision was performed on 04/11/2020 with enlargement of the TIPS to 10 mm.  The TIPS was patent and functioning well, however the measured direct portal  venous pressure remained elevated at greater than 12 mmHg.  Patient continues to require large-volume paracentesis every 9-13 days.  There is a relatively small subset of patients that do not benefit from TIPS creation.  A second, parallel TIPS could be considered, although this is typically reserved for persistent variceal bleeding rather than for control of ascites as the risk for liver decompensation is elevated.  At this time, we will likely have to take a watchful waiting  approach and hope that he will begin to accumulate ascites at a slower pace and require less frequent paracenteses over time.  1.)  Continue diuretics and low-sodium diet.  Follow-up with GI. 2.)  I suspect his tremors may be secondary to muscular weakness and wasting in the setting of chronic hypoalbuminemia and recurrent large-volume paracentesis. 3.)  Continue paracenteses as needed. 4.)  Return to IR clinic with repeat TIPS duplex ultrasound and liver labs in 3 months.  Thank you for this interesting consult.  I greatly enjoyed meeting Martin Strong and look forward to participating in their care.  A copy of this report was sent to the requesting provider on this date.  Electronically Signed: Jacqulynn Cadet 05/09/2020, 2:29 PM   I spent a total of  15 Minutes in remote  clinical consultation, greater than 50% of which was counseling/coordinating care for cirrhosis complicated by ascites.

## 2020-05-12 ENCOUNTER — Telehealth: Payer: Self-pay | Admitting: Gastroenterology

## 2020-05-12 NOTE — Telephone Encounter (Signed)
Patient's daughter called with concern that patient is not getting up out of bed and only had a protein shake today.  Denied any significant fevers or chills.  No progressive abdominal pain per report.  However this is not the patient on a daily basis.  She reports he is having bowel movements and taking his lactulose.  He is status post TIPS creation in April.  Certainly he is at risk for portosystemic encephalopathy and/your SBP or other infectious etiologies.  He has diabetes as well and his blood sugar should be checked at home.  If she cannot wake up her father and he is back to his normal self I recommend that he be brought into the hospital and if she cannot bring him then EMS should bring him in for further evaluation by the emergency department team. Patient's daughter agrees with this plan of action. I will update and sent a message to Dr. Bryan Lemma. I anticipate that they will be coming to [place] long per her report but we will see what happens.  Justice Britain, MD [facility] Office # [phone]

## 2020-05-13 ENCOUNTER — Inpatient Hospital Stay (HOSPITAL_COMMUNITY)
Admission: EM | Admit: 2020-05-13 | Discharge: 2020-05-16 | DRG: 442 | Disposition: A | Discharge status: Home / Self Care | Payer: BC Managed Care – PPO | Attending: Internal Medicine | Admitting: Internal Medicine

## 2020-05-13 ENCOUNTER — Encounter (HOSPITAL_COMMUNITY): Payer: Self-pay

## 2020-05-13 ENCOUNTER — Other Ambulatory Visit: Payer: Self-pay

## 2020-05-13 DIAGNOSIS — E876 Hypokalemia: Secondary | ICD-10-CM

## 2020-05-13 DIAGNOSIS — K7581 Nonalcoholic steatohepatitis (NASH): Secondary | ICD-10-CM | POA: Diagnosis present

## 2020-05-13 DIAGNOSIS — E669 Obesity, unspecified: Secondary | ICD-10-CM | POA: Diagnosis present

## 2020-05-13 DIAGNOSIS — Z833 Family history of diabetes mellitus: Secondary | ICD-10-CM | POA: Diagnosis not present

## 2020-05-13 DIAGNOSIS — R601 Generalized edema: Secondary | ICD-10-CM | POA: Diagnosis not present

## 2020-05-13 DIAGNOSIS — K7200 Acute and subacute hepatic failure without coma: Principal | ICD-10-CM | POA: Diagnosis present

## 2020-05-13 DIAGNOSIS — D649 Anemia, unspecified: Secondary | ICD-10-CM | POA: Diagnosis present

## 2020-05-13 DIAGNOSIS — R140 Abdominal distension (gaseous): Secondary | ICD-10-CM | POA: Diagnosis present

## 2020-05-13 DIAGNOSIS — E8770 Fluid overload, unspecified: Secondary | ICD-10-CM | POA: Diagnosis present

## 2020-05-13 DIAGNOSIS — Z20822 Contact with and (suspected) exposure to covid-19: Secondary | ICD-10-CM | POA: Diagnosis present

## 2020-05-13 DIAGNOSIS — G9340 Encephalopathy, unspecified: Secondary | ICD-10-CM | POA: Diagnosis not present

## 2020-05-13 DIAGNOSIS — D696 Thrombocytopenia, unspecified: Secondary | ICD-10-CM | POA: Diagnosis present

## 2020-05-13 DIAGNOSIS — K7290 Hepatic failure, unspecified without coma: Secondary | ICD-10-CM | POA: Diagnosis present

## 2020-05-13 DIAGNOSIS — K7460 Unspecified cirrhosis of liver: Secondary | ICD-10-CM | POA: Diagnosis present

## 2020-05-13 DIAGNOSIS — K7682 Hepatic encephalopathy: Secondary | ICD-10-CM | POA: Diagnosis present

## 2020-05-13 DIAGNOSIS — E119 Type 2 diabetes mellitus without complications: Secondary | ICD-10-CM | POA: Diagnosis present

## 2020-05-13 DIAGNOSIS — Z6831 Body mass index (BMI) 31.0-31.9, adult: Secondary | ICD-10-CM | POA: Diagnosis not present

## 2020-05-13 DIAGNOSIS — I10 Essential (primary) hypertension: Secondary | ICD-10-CM | POA: Diagnosis present

## 2020-05-13 DIAGNOSIS — R188 Other ascites: Secondary | ICD-10-CM | POA: Diagnosis present

## 2020-05-13 DIAGNOSIS — D689 Coagulation defect, unspecified: Secondary | ICD-10-CM | POA: Diagnosis present

## 2020-05-13 DIAGNOSIS — Z95828 Presence of other vascular implants and grafts: Secondary | ICD-10-CM | POA: Diagnosis not present

## 2020-05-13 DIAGNOSIS — R5381 Other malaise: Secondary | ICD-10-CM | POA: Diagnosis present

## 2020-05-13 DIAGNOSIS — Z79899 Other long term (current) drug therapy: Secondary | ICD-10-CM

## 2020-05-13 DIAGNOSIS — K219 Gastro-esophageal reflux disease without esophagitis: Secondary | ICD-10-CM | POA: Diagnosis present

## 2020-05-13 DIAGNOSIS — E785 Hyperlipidemia, unspecified: Secondary | ICD-10-CM | POA: Diagnosis present

## 2020-05-13 LAB — CBC WITH DIFFERENTIAL/PLATELET
Abs Immature Granulocytes: 0.03 10*3/uL (ref 0.00–0.07)
Basophils Absolute: 0.1 10*3/uL (ref 0.0–0.1)
Basophils Relative: 1 %
Eosinophils Absolute: 0.3 10*3/uL (ref 0.0–0.5)
Eosinophils Relative: 4 %
HCT: 36.1 % — ABNORMAL LOW (ref 39.0–52.0)
Hemoglobin: 11.7 g/dL — ABNORMAL LOW (ref 13.0–17.0)
Immature Granulocytes: 0 %
Lymphocytes Relative: 12 %
Lymphs Abs: 1 10*3/uL (ref 0.7–4.0)
MCH: 29.5 pg (ref 26.0–34.0)
MCHC: 32.4 g/dL (ref 30.0–36.0)
MCV: 90.9 fL (ref 80.0–100.0)
Monocytes Absolute: 0.9 10*3/uL (ref 0.1–1.0)
Monocytes Relative: 11 %
Neutro Abs: 5.5 10*3/uL (ref 1.7–7.7)
Neutrophils Relative %: 72 %
Platelets: 187 10*3/uL (ref 150–400)
RBC: 3.97 MIL/uL — ABNORMAL LOW (ref 4.22–5.81)
RDW: 19.5 % — ABNORMAL HIGH (ref 11.5–15.5)
WBC: 7.7 10*3/uL (ref 4.0–10.5)
nRBC: 0 % (ref 0.0–0.2)

## 2020-05-13 LAB — URINALYSIS, ROUTINE W REFLEX MICROSCOPIC
Bacteria, UA: NONE SEEN
Bilirubin Urine: NEGATIVE
Glucose, UA: >=500 mg/dL — AB
Hgb urine dipstick: NEGATIVE
Ketones, ur: NEGATIVE mg/dL
Leukocytes,Ua: NEGATIVE
Nitrite: NEGATIVE
Protein, ur: NEGATIVE mg/dL
Specific Gravity, Urine: 1.025 (ref 1.005–1.030)
pH: 5 (ref 5.0–8.0)

## 2020-05-13 LAB — COMPREHENSIVE METABOLIC PANEL
ALT: 39 U/L (ref 0–44)
AST: 46 U/L — ABNORMAL HIGH (ref 15–41)
Albumin: 2.7 g/dL — ABNORMAL LOW (ref 3.5–5.0)
Alkaline Phosphatase: 86 U/L (ref 38–126)
Anion gap: 12 (ref 5–15)
BUN: 15 mg/dL (ref 6–20)
CO2: 28 mmol/L (ref 22–32)
Calcium: 8.9 mg/dL (ref 8.9–10.3)
Chloride: 101 mmol/L (ref 98–111)
Creatinine, Ser: 0.78 mg/dL (ref 0.61–1.24)
GFR calc Af Amer: >60 mL/min (ref >60)
GFR calc non Af Amer: >60 mL/min (ref >60)
Glucose, Bld: 197 mg/dL — ABNORMAL HIGH (ref 70–99)
Potassium: 3.4 mmol/L — ABNORMAL LOW (ref 3.5–5.1)
Sodium: 141 mmol/L (ref 135–145)
Total Bilirubin: 1.6 mg/dL — ABNORMAL HIGH (ref 0.3–1.2)
Total Protein: 7.5 g/dL (ref 6.5–8.1)

## 2020-05-13 LAB — CBG MONITORING, ED
Glucose-Capillary: 180 mg/dL — ABNORMAL HIGH (ref 70–99)
Glucose-Capillary: 165 mg/dL — ABNORMAL HIGH (ref 70–99)

## 2020-05-13 LAB — AMMONIA: Ammonia: 126 umol/L — ABNORMAL HIGH (ref 9–35)

## 2020-05-13 LAB — GLUCOSE, CAPILLARY
Glucose-Capillary: 153 mg/dL — ABNORMAL HIGH (ref 70–99)
Glucose-Capillary: 121 mg/dL — ABNORMAL HIGH (ref 70–99)

## 2020-05-13 LAB — SARS CORONAVIRUS 2 BY RT PCR (HOSPITAL ORDER, PERFORMED IN ~~LOC~~ HOSPITAL LAB): SARS Coronavirus 2: NEGATIVE

## 2020-05-13 LAB — PROTIME-INR
INR: 1.3 — ABNORMAL HIGH (ref 0.8–1.2)
Prothrombin Time: 16 seconds — ABNORMAL HIGH (ref 11.4–15.2)

## 2020-05-13 LAB — ETHANOL: Alcohol, Ethyl (B): <10 mg/dL (ref <10)

## 2020-05-13 LAB — MAGNESIUM: Magnesium: 2.7 mg/dL — ABNORMAL HIGH (ref 1.7–2.4)

## 2020-05-13 MED ORDER — POTASSIUM CHLORIDE CRYS ER 20 MEQ PO TBCR
20.0000 meq | EXTENDED_RELEASE_TABLET | Freq: Every day | ORAL | Status: DC
  Administered 2020-05-13: 20 meq via ORAL
  Filled 2020-05-13: qty 1

## 2020-05-13 MED ORDER — PANTOPRAZOLE SODIUM 40 MG PO TBEC
40.0000 mg | DELAYED_RELEASE_TABLET | Freq: Every day | ORAL | Status: DC
  Administered 2020-05-13 – 2020-05-16 (×4): 40 mg via ORAL
  Filled 2020-05-13 (×4): qty 1

## 2020-05-13 MED ORDER — ALBUMIN HUMAN 5 % IV SOLN
25.0000 g | Freq: Once | INTRAVENOUS | Status: DC | PRN
  Filled 2020-05-13: qty 500

## 2020-05-13 MED ORDER — LACTULOSE 10 GM/15ML PO SOLN
30.0000 g | ORAL | Status: AC
  Administered 2020-05-13 (×3): 30 g via ORAL
  Filled 2020-05-13 (×4): qty 45

## 2020-05-13 MED ORDER — FUROSEMIDE 40 MG PO TABS
40.0000 mg | ORAL_TABLET | Freq: Two times a day (BID) | ORAL | Status: DC
  Administered 2020-05-13 – 2020-05-14 (×4): 40 mg via ORAL
  Filled 2020-05-13 (×3): qty 1
  Filled 2020-05-13: qty 2

## 2020-05-13 MED ORDER — SODIUM CHLORIDE 0.9% FLUSH
3.0000 mL | Freq: Once | INTRAVENOUS | Status: AC
  Administered 2020-05-13: 3 mL via INTRAVENOUS

## 2020-05-13 MED ORDER — FERROUS SULFATE 325 (65 FE) MG PO TABS
325.0000 mg | ORAL_TABLET | Freq: Every day | ORAL | Status: DC
  Administered 2020-05-13 – 2020-05-16 (×4): 325 mg via ORAL
  Filled 2020-05-13 (×4): qty 1

## 2020-05-13 MED ORDER — FUROSEMIDE 20 MG PO TABS: 40.0000 mg | ORAL_TABLET | Freq: Two times a day (BID) | ORAL | Status: DC

## 2020-05-13 MED ORDER — ALBUMIN HUMAN 25 % IV SOLN
50.0000 g | Freq: Once | INTRAVENOUS | Status: AC
  Administered 2020-05-13: 50 g via INTRAVENOUS
  Filled 2020-05-13: qty 200

## 2020-05-13 MED ORDER — PROPRANOLOL HCL 20 MG PO TABS
20.0000 mg | ORAL_TABLET | Freq: Two times a day (BID) | ORAL | Status: DC
  Administered 2020-05-13 – 2020-05-16 (×6): 20 mg via ORAL
  Filled 2020-05-13 (×7): qty 1

## 2020-05-13 MED ORDER — ONDANSETRON HCL 4 MG/2ML IJ SOLN: 4.0000 mg | Freq: Four times a day (QID) | INTRAMUSCULAR | Status: DC | PRN

## 2020-05-13 MED ORDER — LACTULOSE 10 GM/15ML PO SOLN
20.0000 g | Freq: Three times a day (TID) | ORAL | Status: DC
  Administered 2020-05-13: 20 g via ORAL
  Filled 2020-05-13 (×3): qty 30

## 2020-05-13 MED ORDER — POTASSIUM CHLORIDE 10 MEQ/100ML IV SOLN
10.0000 meq | INTRAVENOUS | Status: AC
  Administered 2020-05-13 (×4): 10 meq via INTRAVENOUS
  Filled 2020-05-13 (×4): qty 100

## 2020-05-13 MED ORDER — ONDANSETRON HCL 4 MG PO TABS: 4.0000 mg | ORAL_TABLET | Freq: Four times a day (QID) | ORAL | Status: DC | PRN

## 2020-05-13 NOTE — ED Provider Notes (Signed)
[facility] EMERGENCY DEPARTMENT Provider Note   CSN: [id number] Arrival date & time: 05/13/20  1540     History Chief Complaint  Patient presents with  . Altered Mental Status    Martin Strong is a 54 y.o. male with past medical history of NASH liver cirrhosis, diabetes, hypertension, esophageal varices, presenting the emergency department with altered mental status since last night.  Per EMS report, patient was urinating all over the floor throughout night last night and is only oriented to self. He is also reported to have poor PO intake. Per chart review, pt requires frequent large volume paracentesis,  last paracentesis was 05/03/2020 per chart review. Pt denies abdominal pain or current complaints, however history is limited 2/t AMS.  When asked about the episode yesterday urinating of the house, he states "that is what they tell me."  He is oriented to person and town.  The history is limited by the condition of the patient.       Past Medical History:  Diagnosis Date  . Anemia   . Arthritis   . Cirrhosis ([place])   . Diabetes mellitus without complication ([place])   . Dyspnea   . Fatty liver   . GERD (gastroesophageal reflux disease)   . GI bleed   . Hyperlipidemia   . Hypertension     Patient Active Problem List   Diagnosis Date Noted  . 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 Cirrhosis of liver without ascites ([place]) 03/12/2020  . SBP (spontaneous bacterial peritonitis) ([place]) 01/24/2020  . Esophageal varices ([place]) 07/21/2019  . Dysphagia 07/21/2019  . GERD (gastroesophageal reflux disease) 07/21/2019  . Gastritis and gastroduodenitis   . Varices of esophagus determined by endoscopy ([place])   . Portal hypertensive gastropathy ([place])   . Acute upper GI bleed 10/06/2018  . Cirrhosis ([place]) 10/06/2018  . Hypertension 10/06/2018  . Diabetes mellitus without complication ([place]) 08/67/6195    Past Surgical History:  Procedure Laterality Date   . BIOPSY  02/14/2019   Procedure: BIOPSY;  Surgeon: Lavena Bullion, DO;  Location: [facility];  Service: Gastroenterology;;  . BIOPSY  07/22/2019   Procedure: BIOPSY;  Surgeon: Lavena Bullion, DO;  Location: [facility];  Service: Gastroenterology;;  . ESOPHAGEAL BANDING  10/07/2018   Procedure: ESOPHAGEAL BANDING;  Surgeon: Lavena Bullion, DO;  Location: [place];  Service: Gastroenterology;;  . ESOPHAGEAL BANDING N/A 01/03/2019   Procedure: ESOPHAGEAL BANDING;  Surgeon: Lavena Bullion, DO;  Location: [facility];  Service: Gastroenterology;  Laterality: N/A;  . ESOPHAGEAL BANDING  02/14/2019   Procedure: ESOPHAGEAL BANDING;  Surgeon: Lavena Bullion, DO;  Location: [facility];  Service: Gastroenterology;;  . esophageal bands    . ESOPHAGOGASTRODUODENOSCOPY Left 07/22/2019   Procedure: ESOPHAGOGASTRODUODENOSCOPY (EGD) WITH POSSIBLE BANDING;  Surgeon: Lavena Bullion, DO;  Location: [facility];  Service: Gastroenterology;  Laterality: Left;  . ESOPHAGOGASTRODUODENOSCOPY (EGD) WITH PROPOFOL N/A 10/07/2018   Procedure: ESOPHAGOGASTRODUODENOSCOPY (EGD) WITH PROPOFOL;  Surgeon: Lavena Bullion, DO;  Location: [place];  Service: Gastroenterology;  Laterality: N/A;  . ESOPHAGOGASTRODUODENOSCOPY (EGD) WITH PROPOFOL N/A 01/03/2019   Procedure: ESOPHAGOGASTRODUODENOSCOPY (EGD) WITH PROPOFOL;  Surgeon: Lavena Bullion, DO;  Location: [facility];  Service: Gastroenterology;  Laterality: N/A;  . ESOPHAGOGASTRODUODENOSCOPY (EGD) WITH PROPOFOL N/A 02/14/2019   Procedure: ESOPHAGOGASTRODUODENOSCOPY (EGD) WITH PROPOFOL;  Surgeon: Lavena Bullion, DO;  Location: [facility];  Service: Gastroenterology;  Laterality: N/A;  . IR PARACENTESIS  10/05/2019  . IR PARACENTESIS  10/24/2019  .  IR PARACENTESIS  11/16/2019  . IR PARACENTESIS  12/25/2019  . IR PARACENTESIS  01/05/2020  . IR PARACENTESIS  01/23/2020  . IR PARACENTESIS  02/08/2020  . IR PARACENTESIS  02/16/2020  . IR  PARACENTESIS  02/28/2020  . IR PARACENTESIS  03/12/2020  . IR PARACENTESIS  04/04/2020  . IR PARACENTESIS  04/11/2020  . IR PARACENTESIS  04/24/2020  . IR PARACENTESIS  05/03/2020  . IR RADIOLOGIST EVAL & MGMT  12/19/2019  . IR RADIOLOGIST EVAL & MGMT  02/27/2020  . IR RADIOLOGIST EVAL & MGMT  04/09/2020  . IR RADIOLOGIST EVAL & MGMT  05/09/2020  . IR TIPS  03/12/2020  . IR TIPS REVISION MOD SED  04/11/2020  . RADIOLOGY WITH ANESTHESIA N/A 03/12/2020   Procedure: TIPS;  Surgeon: Jacqulynn Cadet, MD;  Location: [place];  Service: Radiology;  Laterality: N/A;       Family History  Problem Relation Age of Onset  . Diabetes Other   . Esophageal cancer Father   . Colon cancer Neg Hx   . Rectal cancer Neg Hx   . Stomach cancer Neg Hx     Social History   Tobacco Use  . Smoking status: Never Smoker  . Smokeless tobacco: Never Used  Substance Use Topics  . Alcohol use: Not Currently  . Drug use: Never    Home Medications Prior to Admission medications   Medication Sig Start Date End Date Taking? Authorizing Provider  cholecalciferol (VITAMIN D) 1000 units tablet Take 1,000 Units by mouth daily.    Yes [provider]  ciprofloxacin (CIPRO) 500 MG tablet Take Cipro 750 mg weekly Patient taking differently: Take 750 mg by mouth once a week. On Mondays 02/26/20  Yes Cirigliano, Vito V, DO  dapagliflozin propanediol (FARXIGA) 10 MG TABS tablet Take 10 mg by mouth at bedtime.   Yes [provider]  ferrous sulfate (KP FERROUS SULFATE) 325 (65 FE) MG tablet Take 1 tablet (325 mg total) by mouth daily with breakfast. Patient taking differently: Take 325 mg by mouth daily.  05/30/19  Yes Cirigliano, Vito V, DO  furosemide (LASIX) 40 MG tablet Take 40 mg by mouth 2 (two) times daily.    Yes [provider]  glipiZIDE (GLUCOTROL XL) 10 MG 24 hr tablet Take 10 mg by mouth 2 (two) times daily.   Yes [provider]  lactulose (CHRONULAC) 10 GM/15ML solution Take 30 mLs  (20 g total) by mouth 3 (three) times daily. 03/13/20  Yes Ardis Rowan, PA-C  levOCARNitine (CARNITOR) 330 MG tablet Take 330 mg by mouth 3 (three) times daily.   Yes [provider]  metFORMIN (GLUCOPHAGE) 1000 MG tablet Take 1,000 mg by mouth 2 (two) times daily with a meal.   Yes [provider]  Multiple Vitamin (MULTIVITAMIN WITH MINERALS) TABS tablet Take 1 tablet by mouth at bedtime.   Yes [provider]  pantoprazole (PROTONIX) 40 MG tablet Take 40 mg by mouth daily.   Yes [provider]  potassium chloride (KLOR-CON) 10 MEQ tablet Take 20 mEq by mouth daily.    Yes [provider]  propranolol (INDERAL) 40 MG tablet Take 0.5 tablets (20 mg total) by mouth 2 (two) times daily. Patient taking differently: Take 40 mg by mouth 2 (two) times daily.  01/27/20  Yes Hosie Poisson, MD  Zinc 50 MG TABS Take 50 mg by mouth daily.   Yes [provider]  feeding supplement, ENSURE ENLIVE, (ENSURE ENLIVE) LIQD Take  237 mLs by mouth 2 (two) times daily between meals. Patient not taking: Reported on 05/13/2020 01/28/20   Hosie Poisson, MD    Allergies    Nadolol  Review of Systems   Review of Systems  Unable to perform ROS: Mental status change    Physical Exam Updated Vital Signs BP 122/83   Pulse 98   Temp 99.4 F (37.4 C) (Oral)   Resp 11   Ht 6' 1"  (1.854 m)   Wt 111.6 kg   SpO2 97%   BMI 32.46 kg/m   Physical Exam Vitals and nursing note reviewed.  Constitutional:      Appearance: He is well-developed.  HENT:     Head: Normocephalic and atraumatic.  Eyes:     Conjunctiva/sclera: Conjunctivae normal.  Cardiovascular:     Rate and Rhythm: Normal rate and regular rhythm.  Pulmonary:     Effort: Pulmonary effort is normal. No respiratory distress.     Breath sounds: Normal breath sounds.  Abdominal:     Palpations: Abdomen is soft.     Comments: Abdomen is markedly distended.  No tenderness is present.    Musculoskeletal:     Comments: Significant bilateral lower extremity edema is present.  Skin:    General: Skin is warm.  Neurological:     Mental Status: He is alert.     Comments: Patient is alert and oriented to person and town.  He states the month is the 12th month of the year.  He states he is at [facility].  Psychiatric:        Behavior: Behavior normal.     ED Results / Procedures / Treatments   Labs (all labs ordered are listed, but only abnormal results are displayed) Labs Reviewed  COMPREHENSIVE METABOLIC PANEL - Abnormal; Notable for the following components:      Result Value   Potassium 3.4 (*)    Glucose, Bld 197 (*)    Albumin 2.7 (*)    AST 46 (*)    Total Bilirubin 1.6 (*)    All other components within normal limits  AMMONIA - Abnormal; Notable for the following components:   Ammonia 126 (*)    All other components within normal limits  CBC WITH DIFFERENTIAL/PLATELET - Abnormal; Notable for the following components:   RBC 3.97 (*)    Hemoglobin 11.7 (*)    HCT 36.1 (*)    RDW 19.5 (*)    All other components within normal limits  PROTIME-INR - Abnormal; Notable for the following components:   Prothrombin Time 16.0 (*)    INR 1.3 (*)    All other components within normal limits  MAGNESIUM - Abnormal; Notable for the following components:   Magnesium 2.7 (*)    All other components within normal limits  CBG MONITORING, ED - Abnormal; Notable for the following components:   Glucose-Capillary 180 (*)    All other components within normal limits  CBG MONITORING, ED - Abnormal; Notable for the following components:   Glucose-Capillary 165 (*)    All other components within normal limits  SARS CORONAVIRUS 2 BY RT PCR (HOSPITAL ORDER, [place] [facility])  URINE CULTURE  ETHANOL  URINALYSIS, ROUTINE W REFLEX MICROSCOPIC    EKG None  Radiology No results found.  Procedures Procedures (including critical care  time)  Medications Ordered in ED Medications  ondansetron (ZOFRAN) tablet 4 mg (has no administration in time range)    Or  ondansetron (ZOFRAN) injection 4  mg (has no administration in time range)  potassium chloride 10 mEq in 100 mL IVPB (10 mEq Intravenous New Bag/Given 05/13/20 1439)  propranolol (INDERAL) tablet 20 mg (20 mg Oral Given 05/13/20 1350)  pantoprazole (PROTONIX) EC tablet 40 mg (40 mg Oral Given 05/13/20 1241)  ferrous sulfate tablet 325 mg (325 mg Oral Given 05/13/20 1236)  potassium chloride SA (KLOR-CON) CR tablet 20 mEq (20 mEq Oral Given 05/13/20 1237)  furosemide (LASIX) tablet 40 mg (40 mg Oral Given 05/13/20 1235)  lactulose (CHRONULAC) 10 GM/15ML solution 30 g (30 g Oral Given 05/13/20 1353)  sodium chloride flush (NS) 0.9 % injection 3 mL (3 mLs Intravenous Given 05/13/20 1243)  albumin human 25 % solution 50 g (50 g Intravenous New Bag/Given 05/13/20 1430)    ED Course  I have reviewed the triage vital signs and the nursing notes.  Pertinent labs & imaging results that were available during my care of the patient were reviewed by me and considered in my medical decision making (see chart for details).    MDM Rules/Calculators/A&P                      Pt with history of NASH liver cirrhosis, presenting to the ED via EMS with altered mental status since yesterday.  Family reports patient was urinating all over the house with poor p.o. intake and is only oriented to self.  He requires frequent large-volume paracentesis outpatient, last paracentesis was 05/03/2020.  He has no complaints today, is alert and oriented to person and town.  He is in no apparent respiratory distress.  Afebrile.  Abdomen is markedly distended though nontender. Low suspicion for SBP.  [place] level is elevated to 126.  Albumin 2.7.  CBC is otherwise unremarkable.  Patient will need admission for encephalopathy with altered mental status.  Hospitalist accepting admission.  The patient appears  reasonably stabilized for admission considering the current resources, flow, and capabilities available in the ED at this time, and I doubt any other [facility] requiring further screening and/or treatment in the ED prior to admission.  Final Clinical Impression(s) / ED Diagnoses Final diagnoses:  Encephalopathy acute    Rx / DC Orders ED Discharge Orders    None       Kishon Garriga, [place] N, PA-C 05/13/20 1506    Pattricia Boss, MD 05/15/20 1205

## 2020-05-13 NOTE — Consult Note (Signed)
Consultation  Referring Provider:  Dr. Benny Lennert    Primary Care Physician:  [name], NP Primary Gastroenterologist: Dr. Bryan Lemma        Reason for Consultation:  Hepatic Encephalopathy            HPI:   Martin Strong is a 54 y.o. male with a past medical history of Martin Strong cirrhosis diagnosed in February 2017 with history of variceal bleeding and others listed below who underwent TIPS creation on 03/12/2020 and TIPS revision on 04/11/2020, who presented to the ER today with hepatic encephalopathy.    Patient is disoriented today at time of interview. Today he tells me that he is "in the hospital" but not sure where.  Tells me he has been taking his medicines and having 1 bowel movement a day.  Denies any abdominal pain.  Does not offer any information by himself but seems somewhat willing and able to answer most questions.   Denies fever or chills.  ER course: Ammonia 126, CBC within patient's normal limits, CMP with minimally decreased potassium at 3.4  GI history: 05/09/2020 ultrasound of the abdomen/pelvis to evaluate hepatic inflow and outflow vessels: Patent TIPS with relatively high velocities throughout, moderate ascites, hepatic cirrhosis  04/23/2020 office visit with Dr. Bryan Lemma: At that time had lower extremity swelling and ascites, this is diuretic intolerant and he had required serial large-volume paracentesis and low-sodium diet with low-dose diuretics, also follows with Timmothy Sours very sick at the [facility] hepatology clinic he was valued by Dr. Sanda Linger IR for possible TIPS in 12/2018  History from that office visit note: Cirrhosis Hx: - Etiology: NASH - Diagnosed 2017 - Liver ZY:SAYT - Complications: EV with admission for bleeding 09/2018 treated with EGD with EVL, large volume ascitesserial LVP starting 09/2019, SBP 01/2020 -HepaticRx: None currently -Previous hepatic Rx: Propranolol (dual prophy)and PPI discontinued due to SBP.  History of diuretic intolerance.   Follows with Nephrology-restarted Lasix 40 mg/day 03/2020.   - [facility]:MRI 11/2019: Ascites, no HCC. AFP <0.9. -03/13/2020: TIPS procedure -04/11/2020: TIPS revision -Annual lab check: Completed 05/2019-mild iron deficiency. Takes ferrous sulfate -HAV and HBV vaccine series started 05/2019 - MELD:11(fluctuates 11-14) - Child Pugh: B(7pts)  Endoscopic history: -EGD x3 in 2017 in [place], [place] for esophageal varices with banding -EGD (09/2018, Dr. Bryan Lemma): Grade 2 esophageal varices with stigmata of recent bleeding requiring banding x2 with complete eradication, portal hypertensive gastropathy without bleeding. - EGD (12/2018, Dr. Bryan Lemma): Grade II esophageal varices, 6 bands placed with deflation. Normal Z line at 46 cm, severe portal HTN gastropathy with contact oozing, small GOV1, normal duodenum. H pylori serologies negative -EGD (02/2019, Dr. Bryan Lemma): Grade 2 esophageal varices, 4 bands placed with complete eradication.Portal hypertensive gastropathy/2 adenopathy -EGD (07/2019, Dr. Bryan Lemma): Done for dysphagia. grade 1 esophageal varices, multiple post variceal banding scars in the lower third with 1 area of luminal deformity, likely consistent with prior deep banding site. The lumen was mildly narrowed, but easily traversed and no additional endoscopic dilation performed. Mild esophagitis at the GEJ, portal hypertensive gastropathy, peptic antritis/duodenitis. Papilla prominent.  Past Medical History:  Diagnosis Date  . Anemia   . Arthritis   . Cirrhosis ([place])   . Diabetes mellitus without complication ([place])   . Dyspnea   . Fatty liver   . GERD (gastroesophageal reflux disease)   . GI bleed   . Hyperlipidemia   . Hypertension     Past Surgical History:  Procedure Laterality Date  . BIOPSY  02/14/2019  Procedure: BIOPSY;  Surgeon: Lavena Bullion, DO;  Location: [facility];  Service: Gastroenterology;;  . BIOPSY  07/22/2019   Procedure: BIOPSY;   Surgeon: Lavena Bullion, DO;  Location: [facility];  Service: Gastroenterology;;  . ESOPHAGEAL BANDING  10/07/2018   Procedure: ESOPHAGEAL BANDING;  Surgeon: Lavena Bullion, DO;  Location: [facility];  Service: Gastroenterology;;  . ESOPHAGEAL BANDING N/A 01/03/2019   Procedure: ESOPHAGEAL BANDING;  Surgeon: Lavena Bullion, DO;  Location: [facility];  Service: Gastroenterology;  Laterality: N/A;  . ESOPHAGEAL BANDING  02/14/2019   Procedure: ESOPHAGEAL BANDING;  Surgeon: Lavena Bullion, DO;  Location: [facility];  Service: Gastroenterology;;  . esophageal bands    . ESOPHAGOGASTRODUODENOSCOPY Left 07/22/2019   Procedure: ESOPHAGOGASTRODUODENOSCOPY (EGD) WITH POSSIBLE BANDING;  Surgeon: Lavena Bullion, DO;  Location: [facility];  Service: Gastroenterology;  Laterality: Left;  . ESOPHAGOGASTRODUODENOSCOPY (EGD) WITH PROPOFOL N/A 10/07/2018   Procedure: ESOPHAGOGASTRODUODENOSCOPY (EGD) WITH PROPOFOL;  Surgeon: Lavena Bullion, DO;  Location: [facility];  Service: Gastroenterology;  Laterality: N/A;  . ESOPHAGOGASTRODUODENOSCOPY (EGD) WITH PROPOFOL N/A 01/03/2019   Procedure: ESOPHAGOGASTRODUODENOSCOPY (EGD) WITH PROPOFOL;  Surgeon: Lavena Bullion, DO;  Location: [facility];  Service: Gastroenterology;  Laterality: N/A;  . ESOPHAGOGASTRODUODENOSCOPY (EGD) WITH PROPOFOL N/A 02/14/2019   Procedure: ESOPHAGOGASTRODUODENOSCOPY (EGD) WITH PROPOFOL;  Surgeon: Lavena Bullion, DO;  Location: [facility];  Service: Gastroenterology;  Laterality: N/A;  . IR PARACENTESIS  10/05/2019  . IR PARACENTESIS  10/24/2019  . IR PARACENTESIS  11/16/2019  . IR PARACENTESIS  12/25/2019  . IR PARACENTESIS  01/05/2020  . IR PARACENTESIS  01/23/2020  . IR PARACENTESIS  02/08/2020  . IR PARACENTESIS  02/16/2020  . IR PARACENTESIS  02/28/2020  . IR PARACENTESIS  03/12/2020  . IR PARACENTESIS  04/04/2020  . IR PARACENTESIS  04/11/2020  . IR PARACENTESIS  04/24/2020  . IR PARACENTESIS  05/03/2020    . IR RADIOLOGIST EVAL & MGMT  12/19/2019  . IR RADIOLOGIST EVAL & MGMT  02/27/2020  . IR RADIOLOGIST EVAL & MGMT  04/09/2020  . IR RADIOLOGIST EVAL & MGMT  05/09/2020  . IR TIPS  03/12/2020  . IR TIPS REVISION MOD SED  04/11/2020  . RADIOLOGY WITH ANESTHESIA N/A 03/12/2020   Procedure: TIPS;  Surgeon: Jacqulynn Cadet, MD;  Location: [facility];  Service: Radiology;  Laterality: N/A;    Family History  Problem Relation Age of Onset  . Diabetes Other   . Esophageal cancer Father   . Colon cancer Neg Hx   . Rectal cancer Neg Hx   . Stomach cancer Neg Hx     Social History   Tobacco Use  . Smoking status: Never Smoker  . Smokeless tobacco: Never Used  Substance Use Topics  . Alcohol use: Not Currently  . Drug use: Never    Prior to Admission medications   Medication Sig Start Date End Date Taking? Authorizing Provider  cholecalciferol (VITAMIN D) 1000 units tablet Take 1,000 Units by mouth daily.    Yes [provider]  ciprofloxacin (CIPRO) 500 MG tablet Take Cipro 750 mg weekly Patient taking differently: Take 750 mg by mouth once a week. On Mondays 02/26/20  Yes Cirigliano, Vito V, DO  dapagliflozin propanediol (FARXIGA) 10 MG TABS tablet Take 10 mg by mouth at bedtime.   Yes [provider]  ferrous sulfate (KP FERROUS SULFATE) 325 (65 FE) MG tablet Take 1 tablet (325 mg total) by mouth daily with breakfast. Patient taking differently: Take 325  mg by mouth daily.  05/30/19  Yes Cirigliano, Vito V, DO  furosemide (LASIX) 40 MG tablet Take 40 mg by mouth 2 (two) times daily.    Yes [provider]  glipiZIDE (GLUCOTROL XL) 10 MG 24 hr tablet Take 10 mg by mouth 2 (two) times daily.   Yes [provider]  lactulose (CHRONULAC) 10 GM/15ML solution Take 30 mLs (20 g total) by mouth 3 (three) times daily. 03/13/20  Yes Ardis Rowan, PA-C  levOCARNitine (CARNITOR) 330 MG tablet Take 330 mg by mouth 3 (three) times daily.   Yes [provider]   metFORMIN (GLUCOPHAGE) 1000 MG tablet Take 1,000 mg by mouth 2 (two) times daily with a meal.   Yes [provider]  Multiple Vitamin (MULTIVITAMIN WITH MINERALS) TABS tablet Take 1 tablet by mouth at bedtime.   Yes [provider]  pantoprazole (PROTONIX) 40 MG tablet Take 40 mg by mouth daily.   Yes [provider]  potassium chloride (KLOR-CON) 10 MEQ tablet Take 20 mEq by mouth daily.    Yes [provider]  propranolol (INDERAL) 40 MG tablet Take 0.5 tablets (20 mg total) by mouth 2 (two) times daily. Patient taking differently: Take 40 mg by mouth 2 (two) times daily.  01/27/20  Yes Hosie Poisson, MD  Zinc 50 MG TABS Take 50 mg by mouth daily.   Yes [provider]  feeding supplement, ENSURE ENLIVE, (ENSURE ENLIVE) LIQD Take 237 mLs by mouth 2 (two) times daily between meals. Patient not taking: Reported on 05/13/2020 01/28/20   Hosie Poisson, MD    Current Facility-Administered Medications  Medication Dose Route Frequency Provider Last Rate Last Admin  . albumin human 5 % solution 25 g  25 g Intravenous Once PRN Swayze, Ava, DO      . ferrous sulfate tablet 325 mg  325 mg Oral Daily Swayze, Ava, DO   325 mg at 05/13/20 1236  . furosemide (LASIX) tablet 40 mg  40 mg Oral BID Swayze, Ava, DO   40 mg at 05/13/20 1235  . lactulose (CHRONULAC) 10 GM/15ML solution 20 g  20 g Oral TID Swayze, Ava, DO      . ondansetron (ZOFRAN) tablet 4 mg  4 mg Oral Q6H PRN Swayze, Ava, DO       Or  . ondansetron (ZOFRAN) injection 4 mg  4 mg Intravenous Q6H PRN Swayze, Ava, DO      . pantoprazole (PROTONIX) EC tablet 40 mg  40 mg Oral Daily Swayze, Ava, DO      . potassium chloride 10 mEq in 100 mL IVPB  10 mEq Intravenous Q1 Hr x 4 Swayze, Ava, DO 100 mL/hr at 05/13/20 1234 10 mEq at 05/13/20 1234  . potassium chloride SA (KLOR-CON) CR tablet 20 mEq  20 mEq Oral Daily Swayze, Ava, DO   20 mEq at 05/13/20 1237  . propranolol (INDERAL) tablet 20 mg  20 mg Oral BID  Swayze, Ava, DO      . sodium chloride flush (NS) 0.9 % injection 3 mL  3 mL Intravenous Once Pattricia Boss, MD       Current Outpatient Medications  Medication Sig Dispense Refill  . cholecalciferol (VITAMIN D) 1000 units tablet Take 1,000 Units by mouth daily.     . ciprofloxacin (CIPRO) 500 MG tablet Take Cipro 750 mg weekly (Patient taking differently: Take 750 mg by mouth once a week. On Mondays) 8 tablet 3  . dapagliflozin propanediol (FARXIGA) 10 MG  TABS tablet Take 10 mg by mouth at bedtime.    . ferrous sulfate (KP FERROUS SULFATE) 325 (65 FE) MG tablet Take 1 tablet (325 mg total) by mouth daily with breakfast. (Patient taking differently: Take 325 mg by mouth daily. ) 30 tablet 3  . furosemide (LASIX) 40 MG tablet Take 40 mg by mouth 2 (two) times daily.     Marland Kitchen glipiZIDE (GLUCOTROL XL) 10 MG 24 hr tablet Take 10 mg by mouth 2 (two) times daily.    Marland Kitchen lactulose (CHRONULAC) 10 GM/15ML solution Take 30 mLs (20 g total) by mouth 3 (three) times daily. 236 mL 0  . levOCARNitine (CARNITOR) 330 MG tablet Take 330 mg by mouth 3 (three) times daily.    . metFORMIN (GLUCOPHAGE) 1000 MG tablet Take 1,000 mg by mouth 2 (two) times daily with a meal.    . Multiple Vitamin (MULTIVITAMIN WITH MINERALS) TABS tablet Take 1 tablet by mouth at bedtime.    . pantoprazole (PROTONIX) 40 MG tablet Take 40 mg by mouth daily.    . potassium chloride (KLOR-CON) 10 MEQ tablet Take 20 mEq by mouth daily.     . propranolol (INDERAL) 40 MG tablet Take 0.5 tablets (20 mg total) by mouth 2 (two) times daily. (Patient taking differently: Take 40 mg by mouth 2 (two) times daily. ) 60 tablet 0  . Zinc 50 MG TABS Take 50 mg by mouth daily.    . feeding supplement, ENSURE ENLIVE, (ENSURE ENLIVE) LIQD Take 237 mLs by mouth 2 (two) times daily between meals. (Patient not taking: Reported on 05/13/2020) 237 mL 12    Allergies as of 05/13/2020 - Review Complete 05/13/2020  Allergen Reaction Noted  . Nadolol Itching and  Nausea Only 10/06/2018     Review of Systems:    Constitutional: No weight loss, fever or chills Skin: No rash  Cardiovascular: No chest pain Respiratory: No SOB Gastrointestinal: See HPI and otherwise negative Genitourinary: No dysuria  Neurological: No dizziness or syncope Musculoskeletal: No new muscle or joint pain Hematologic: No bleeding  Psychiatric: No history of depression or anxiety    Physical Exam:  Vital signs in last 24 hours: Temp:  [99.4 F (37.4 C)] 99.4 F (37.4 C) (05/31 0918) Pulse Rate:  [91-93] 93 (05/31 1215) Resp:  [11-14] 11 (05/31 1215) BP: (104-125)/(64-74) 125/74 (05/31 1215) SpO2:  [97 %] 97 % (05/31 1215) Weight:  [111.6 kg] 111.6 kg (05/31 0855)   General:   Pleasant Caucasian male appears to be in NAD, Well developed, Well nourished, alert and cooperative Head:  Normocephalic and atraumatic. Eyes:   PEERL, EOMI. No icterus. Conjunctiva pink. Ears:  Normal auditory acuity. Neck:  Supple Throat: Oral cavity and pharynx without inflammation, swelling or lesion.  Lungs: Respirations even and unlabored. Lungs clear to auscultation bilaterally.   No wheezes, crackles, or rhonchi.  Heart: Normal S1, S2. No MRG. Regular rate and rhythm. No peripheral edema, cyanosis or pallor.  Abdomen:  Tense, Marked distension, nontender. No rebound or guarding. Normal bowel sounds. No appreciable masses or hepatomegaly. Rectal:  Not performed.  Msk:  Symmetrical without gross deformities. Peripheral pulses intact.  Extremities: Marked BLE pitting edema to level of the knee Neurologic:  Alert. +asterixis Skin:   Dry and intact without significant lesions or rashes. Psychiatric: Oriented to person and "hospital" but not exact hospital or day,    LAB RESULTS: Recent Labs    05/13/20 0900  WBC 7.7  HGB 11.7*  HCT 36.1*  PLT  187   BMET Recent Labs    05/13/20 0900  NA 141  K 3.4*  CL 101  CO2 28  GLUCOSE 197*  BUN 15  CREATININE 0.78  CALCIUM 8.9     LFT Recent Labs    05/13/20 0900  PROT 7.5  ALBUMIN 2.7*  AST 46*  ALT 39  ALKPHOS 86  BILITOT 1.6*   PT/INR Recent Labs    05/13/20 1140  LABPROT 16.0*  INR 1.3*    Impression / Plan:   Impression: 1.  Decompensated NASH cirrhosis: With history of varices and now presenting with hepatic encephalopathy status post TIPS 3/30 with revision on 04/11/2020 and ascites 2.  Hepatic encephalopathy 3.  Ascites  Plan: 1.  Added Lactulose 30 g PO every 2 hours x3 doses. 2.  Patient will need to be started on Rifaximin 3.  Agree with therapeutic paracentesis, no greater than 6 L.  Ordered 50 g of albumin to be given as well.  Cytology, cell count and differential, protein level etc. have been ordered by the hospitalist team. 4.  Patient to remain n.p.o. for now 5.  Please await any further recommendations from Dr. Rush Landmark later today.  Thank you for your kind consultation, we will continue to follow.  [facility]  05/13/2020, 12:39 PM

## 2020-05-13 NOTE — ED Triage Notes (Signed)
Per [facility] pt from home, per family pt is not his normal, last paracentesis 04/25/2020. PCP contacted last night due to abd distended, getting up urinating in the floor. Alert to person only. Pt refused transport initially. [place]unt in his abd for paracentesis. BLE edema >right side   BP 128/70 HR 94 RR 16 98% RA   CBG 257

## 2020-05-13 NOTE — H&P (Signed)
Martin Strong is an 54 y.o. male.   Chief Complaint: Decreased level of consciousness and abdominal distention. 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 The patient had 8L of ascitic fluid removed off of his abdomen on 04/26/2020. Yesterday the patient's wife discussed the patient with Dr. Rush Landmark due to the patient's altered mental status and abdominal distention. She brought the patient to the ED due to decreasing level of consciousness.  In the ED the patient is found to be clinically encephalopathic with a massive ascites, and anasarca. Ammonia level is 126. Potassium is 3.4. Glucose is elevated at 197, and albumin is diminished at 2.6. INR is 1.3. ETOH was less than 10.  [facility] have been consulted to admit the patient for further evaluation and treatment.  Past Medical History:  Diagnosis Date  . Anemia   . Arthritis   . Cirrhosis ([place])   . Diabetes mellitus without complication ([place])   . Dyspnea   . Fatty liver   . GERD (gastroesophageal reflux disease)   . GI bleed   . Hyperlipidemia   . Hypertension     Past Surgical History:  Procedure Laterality Date  . BIOPSY  02/14/2019   Procedure: BIOPSY;  Surgeon: Lavena Bullion, DO;  Location: [facility];  Service: Gastroenterology;;  . BIOPSY  07/22/2019   Procedure: BIOPSY;  Surgeon: Lavena Bullion, DO;  Location: [facility];  Service: Gastroenterology;;  . ESOPHAGEAL BANDING  10/07/2018   Procedure: ESOPHAGEAL BANDING;  Surgeon: Lavena Bullion, DO;  Location: [place];  Service: Gastroenterology;;  . ESOPHAGEAL BANDING N/A 01/03/2019   Procedure: ESOPHAGEAL BANDING;  Surgeon: Lavena Bullion, DO;  Location: [facility];  Service: Gastroenterology;  Laterality: N/A;  . ESOPHAGEAL BANDING  02/14/2019   Procedure: ESOPHAGEAL BANDING;  Surgeon: Lavena Bullion, DO;  Location: [facility];  Service: Gastroenterology;;  . esophageal bands    . ESOPHAGOGASTRODUODENOSCOPY Left 07/22/2019   Procedure: ESOPHAGOGASTRODUODENOSCOPY (EGD) WITH POSSIBLE BANDING;  Surgeon: Lavena Bullion, DO;  Location[facility];  Service: Gastroenterology;  Laterality: Left;  . ESOPHAGOGASTRODUODENOSCOPY (EGD) WITH PROPOFOL N/A 10/07/2018   Procedure: ESOPHAGOGASTRODUODENOSCOPY (EGD) WITH PROPOFOL;  Surgeon: Lavena Bullion, DO;  Location: [place];  Service: Gastroenterology;  Laterality: N/A;  . ESOPHAGOGASTRODUODENOSCOPY (EGD) WITH PROPOFOL N/A 01/03/2019   Procedure: ESOPHAGOGASTRODUODENOSCOPY (EGD) WITH PROPOFOL;  Surgeon: Lavena Bullion, DO;  [facility];  Service: Gastroenterology;  Laterality: N/A;  . ESOPHAGOGASTRODUODENOSCOPY (EGD) WITH PROPOFOL N/A 02/14/2019   Procedure: ESOPHAGOGASTRODUODENOSCOPY (EGD) WITH PROPOFOL;  Surgeon: Lavena Bullion, DO;  [facility];  Service: Gastroenterology;  Laterality: N/A;  . IR PARACENTESIS  10/05/2019  . IR PARACENTESIS  10/24/2019  . IR PARACENTESIS  11/16/2019  . IR PARACENTESIS  12/25/2019  . IR PARACENTESIS  01/05/2020  . IR PARACENTESIS  01/23/2020  . IR PARACENTESIS  02/08/2020  . IR PARACENTESIS  02/16/2020  . IR PARACENTESIS  02/28/2020  . IR PARACENTESIS  03/12/2020  . IR PARACENTESIS  04/04/2020  . IR PARACENTESIS  04/11/2020  . IR PARACENTESIS  04/24/2020  . IR PARACENTESIS  05/03/2020  . IR RADIOLOGIST EVAL & MGMT  12/19/2019  . IR RADIOLOGIST EVAL & MGMT  02/27/2020  . IR RADIOLOGIST EVAL & MGMT  04/09/2020  . IR RADIOLOGIST EVAL & MGMT  05/09/2020  . IR TIPS  03/12/2020  .  IR TIPS REVISION MOD SED  04/11/2020  . RADIOLOGY WITH ANESTHESIA N/A 03/12/2020   Procedure: TIPS;  Surgeon: Jacqulynn Cadet, MD;  Location: [place];  Service: Radiology;  Laterality: N/A;    Family History  Problem Relation Age of Onset  . Diabetes Other   . Esophageal cancer Father   . Colon cancer Neg Hx    . Rectal cancer Neg Hx   . Stomach cancer Neg Hx    Social History:  reports that he has never smoked. He has never used smokeless tobacco. He reports previous alcohol use. He reports that he does not use drugs. (Not in a hospital admission)   Allergies:  Allergies  Allergen Reactions  . Nadolol Itching and Nausea Only    Review of systems not obtained due to patient factors.   General appearance: delirious and no distress Head: Normocephalic, without obvious abnormality, atraumatic Eyes: conjunctivae/corneas clear. PERRL, EOM's intact. Fundi benign. Throat: lips, mucosa, and tongue normal; teeth and gums normal Neck: no adenopathy, no carotid bruit, no JVD, supple, symmetrical, trachea midline and thyroid not enlarged, symmetric, no tenderness/mass/nodules Resp: No increased work of breathing. No wheezes, rales, or rhonchi. No tactile fremitus. Chest wall: no tenderness Cardio: regular rate and rhythm, S1, S2 normal, no murmur, click, rub or gallop GI: soft, non-tender; bowel sounds normal; no masses,  no organomegaly Extremities: edema 4+ pitting edema of lower extremities bilaterally. Pulses: 2+ and symmetric Skin: Skin color, texture, turgor normal. No rashes or lesions Lymph nodes: Cervical, supraclavicular, and axillary nodes normal. Neurologic: Pt is lethargic. He is verbally responsive, but confused. He is moving all extremities.  Results for orders placed or performed during the hospital encounter of 05/13/20 (from the past 48 hour(s))  Comprehensive metabolic panel     Status: Abnormal   Collection Time: 05/13/20  9:00 AM  Result Value Ref Range   Sodium 141 135 - 145 mmol/L   Potassium 3.4 (L) 3.5 - 5.1 mmol/L   Chloride 101 98 - 111 mmol/L   CO2 28 22 - 32 mmol/L   Glucose, Bld 197 (H) 70 - 99 mg/dL    Comment: Glucose reference range applies only to samples taken after fasting for at least 8 hours.   BUN 15 6 - 20 mg/dL   Creatinine, Ser 0.78 0.61 - 1.24 mg/dL    Calcium 8.9 8.9 - 10.3 mg/dL   Total Protein 7.5 6.5 - 8.1 g/dL   Albumin 2.7 (L) 3.5 - 5.0 g/dL   AST 46 (H) 15 - 41 U/L   ALT 39 0 - 44 U/L   Alkaline Phosphatase 86 38 - 126 U/L   Total Bilirubin 1.6 (H) 0.3 - 1.2 mg/dL   GFR calc non Af Amer >60 >60 mL/min   GFR calc Af Amer >60 >60 mL/min   Anion gap 12 5 - 15    Comment: Performed at [facility] [address], [place], [place] [address]  Ethanol     Status: None   Collection Time: 05/13/20  9:00 AM  Result Value Ref Range   Alcohol, Ethyl (B) <10 <10 mg/dL    Comment: (NOTE) Lowest detectable limit for serum alcohol is 10 mg/dL. For medical purposes only. Performed at [facility], [place] [address], [place], [place] [address]   Ammonia     Status: Abnormal   Collection Time: 05/13/20  9:00 AM  Result Value Ref Range   Ammonia 126 (H) 9 - 35 umol/L  Comment: Performed at [facility], [place] [address] [place], [place] [address]  CBC with Differential     Status: Abnormal   Collection Time: 05/13/20  9:00 AM  Result Value Ref Range   WBC 7.7 4.0 - 10.5 K/uL   RBC 3.97 (L) 4.22 - 5.81 MIL/uL   Hemoglobin 11.7 (L) 13.0 - 17.0 g/dL   HCT 36.1 (L) 39.0 - 52.0 %   MCV 90.9 80.0 - 100.0 fL   MCH 29.5 26.0 - 34.0 pg   MCHC 32.4 30.0 - 36.0 g/dL   RDW 19.5 (H) 11.5 - 15.5 %   Platelets 187 150 - 400 K/uL   nRBC 0.0 0.0 - 0.2 %   Neutrophils Relative % 72 %   Neutro Abs 5.5 1.7 - 7.7 K/uL   Lymphocytes Relative 12 %   Lymphs Abs 1.0 0.7 - 4.0 K/uL   Monocytes Relative 11 %   Monocytes Absolute 0.9 0.1 - 1.0 K/uL   Eosinophils Relative 4 %   Eosinophils Absolute 0.3 0.0 - 0.5 K/uL   Basophils Relative 1 %   Basophils Absolute 0.1 0.0 - 0.1 K/uL   Immature Granulocytes 0 %   Abs Immature Granulocytes 0.03 0.00 - 0.07 K/uL    Comment: Performed at [facility] [address] [place], [place] [address]  CBG monitoring, ED     Status: Abnormal   Collection Time: 05/13/20  9:22 AM  Result  Value Ref Range   Glucose-Capillary 180 (H) 70 - 99 mg/dL    Comment: Glucose reference range applies only to samples taken after fasting for at least 8 hours.  Protime-INR     Status: Abnormal   Collection Time: 05/13/20 11:40 AM  Result Value Ref Range   Prothrombin Time 16.0 (H) 11.4 - 15.2 seconds   INR 1.3 (H) 0.8 - 1.2    Comment: (NOTE) INR goal varies based on device and disease states. Performed at [facility], [place] [address] [place], [place] [address]   CBG monitoring, ED     Status: Abnormal   Collection Time: 05/13/20 12:18 PM  Result Value Ref Range   Glucose-Capillary 165 (H) 70 - 99 mg/dL    Comment: Glucose reference range applies only to samples taken after fasting for at least 8 hours.   @[id number] @  Blood pressure 125/74, pulse 93, temperature 99.4 F (37.4 C), temperature source Oral, resp. rate 11, height 6' 1"  (1.854 m), weight 111.6 kg, SpO2 97 %.   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 He reportedly has been taking his lactulose and having BM's. He presents with clinical encephalopathy and an ammonia level of 126. The patient will be admitted to a progressive care unit. He will be continued on his lactulose by mouth if he is able to swallow safely. If not an NGT will be placed. I have discussed this patient with Dr. Rush Landmark. The patient will be started on Rifaximin.  Ascites/anasarca: The patient is s/p TIPS in 03/2020. His last paracentesis was on 04/26/2020. He presents with massive ascites and anasarca. IR has been consulted to perform paracentesis. Chemistry, cultures, and cell count will be performed on the aspirate. The patient will be given albumin on call for the procedure. I have discussed the patient with Dr. Rush Landmark. Although, at times, it is possible for IR to perform a second or "double" TIPS procedure, the patient's encephalopathy argues against the feasibility of this approach. Continue  diuretics.  Cirrhosis: Due to NASH. Pt with volume overload. Continue diuretics, sodium restriction, propranolol, protonix,and lactulose. Rifaximin will be started as per Dr Donneta Romberg recommendation.  DM II: Will follow FSBS with SSI for now. Monitor glucoses. Hold oral antihyperglycemics.   Hypertension: Blood pressures are within normal limits.  Hyperlipidemia: Noted. Pt on no statins.  I have seen and examined this patient myself. I have spent 72 minutes in his evaluation and care.  Jamarian Jacinto 05/13/2020, 12:34 PM

## 2020-05-13 NOTE — ED Notes (Signed)
GI at bedside

## 2020-05-14 ENCOUNTER — Inpatient Hospital Stay (HOSPITAL_COMMUNITY): Payer: BC Managed Care – PPO

## 2020-05-14 HISTORY — PX: IR PARACENTESIS: IMG2679

## 2020-05-14 LAB — LACTATE DEHYDROGENASE, PLEURAL OR PERITONEAL FLUID: LD, Fluid: 73 U/L — ABNORMAL HIGH (ref 3–23)

## 2020-05-14 LAB — COMPREHENSIVE METABOLIC PANEL
ALT: 35 U/L (ref 0–44)
AST: 40 U/L (ref 15–41)
Albumin: 2.9 g/dL — ABNORMAL LOW (ref 3.5–5.0)
Alkaline Phosphatase: 70 U/L (ref 38–126)
Anion gap: 10 (ref 5–15)
BUN: 12 mg/dL (ref 6–20)
CO2: 30 mmol/L (ref 22–32)
Calcium: 8.7 mg/dL — ABNORMAL LOW (ref 8.9–10.3)
Chloride: 102 mmol/L (ref 98–111)
Creatinine, Ser: 0.71 mg/dL (ref 0.61–1.24)
GFR calc Af Amer: >60 mL/min (ref >60)
GFR calc non Af Amer: >60 mL/min (ref >60)
Glucose, Bld: 93 mg/dL (ref 70–99)
Potassium: 2.7 mmol/L — CL (ref 3.5–5.1)
Sodium: 142 mmol/L (ref 135–145)
Total Bilirubin: 2.2 mg/dL — ABNORMAL HIGH (ref 0.3–1.2)
Total Protein: 6.7 g/dL (ref 6.5–8.1)

## 2020-05-14 LAB — GLUCOSE, PLEURAL OR PERITONEAL FLUID: Glucose, Fluid: 121 mg/dL

## 2020-05-14 LAB — BODY FLUID CELL COUNT WITH DIFFERENTIAL
Eos, Fluid: 0 %
Lymphs, Fluid: 81 %
Monocyte-Macrophage-Serous Fluid: 17 % — ABNORMAL LOW (ref 50–90)
Neutrophil Count, Fluid: 2 % (ref 0–25)
Total Nucleated Cell Count, Fluid: 124 cu mm (ref 0–1000)

## 2020-05-14 LAB — BASIC METABOLIC PANEL
Anion gap: 12 (ref 5–15)
BUN: 12 mg/dL (ref 6–20)
CO2: 28 mmol/L (ref 22–32)
Calcium: 8.4 mg/dL — ABNORMAL LOW (ref 8.9–10.3)
Chloride: 102 mmol/L (ref 98–111)
Creatinine, Ser: 0.68 mg/dL (ref 0.61–1.24)
GFR calc Af Amer: >60 mL/min (ref >60)
GFR calc non Af Amer: >60 mL/min (ref >60)
Glucose, Bld: 77 mg/dL (ref 70–99)
Potassium: 3.2 mmol/L — ABNORMAL LOW (ref 3.5–5.1)
Sodium: 142 mmol/L (ref 135–145)

## 2020-05-14 LAB — CBC
HCT: 30.9 % — ABNORMAL LOW (ref 39.0–52.0)
Hemoglobin: 10.2 g/dL — ABNORMAL LOW (ref 13.0–17.0)
MCH: 29.9 pg (ref 26.0–34.0)
MCHC: 33 g/dL (ref 30.0–36.0)
MCV: 90.6 fL (ref 80.0–100.0)
Platelets: 121 10*3/uL — ABNORMAL LOW (ref 150–400)
RBC: 3.41 MIL/uL — ABNORMAL LOW (ref 4.22–5.81)
RDW: 19.3 % — ABNORMAL HIGH (ref 11.5–15.5)
WBC: 5 10*3/uL (ref 4.0–10.5)
nRBC: 0 % (ref 0.0–0.2)

## 2020-05-14 LAB — URINE CULTURE: Culture: NO GROWTH

## 2020-05-14 LAB — GLUCOSE, CAPILLARY
Glucose-Capillary: 79 mg/dL (ref 70–99)
Glucose-Capillary: 75 mg/dL (ref 70–99)
Glucose-Capillary: 77 mg/dL (ref 70–99)
Glucose-Capillary: 156 mg/dL — ABNORMAL HIGH (ref 70–99)
Glucose-Capillary: 311 mg/dL — ABNORMAL HIGH (ref 70–99)

## 2020-05-14 LAB — ALBUMIN, PLEURAL OR PERITONEAL FLUID: Albumin, Fluid: <1 g/dL

## 2020-05-14 LAB — MAGNESIUM: Magnesium: 2 mg/dL (ref 1.7–2.4)

## 2020-05-14 LAB — PROTIME-INR
INR: 1.5 — ABNORMAL HIGH (ref 0.8–1.2)
Prothrombin Time: 17.1 seconds — ABNORMAL HIGH (ref 11.4–15.2)

## 2020-05-14 LAB — HEMOGLOBIN A1C
Hgb A1c MFr Bld: 7 % — ABNORMAL HIGH (ref 4.8–5.6)
Mean Plasma Glucose: 154.2 mg/dL

## 2020-05-14 LAB — GRAM STAIN: Gram Stain: NONE SEEN

## 2020-05-14 LAB — PROTEIN, PLEURAL OR PERITONEAL FLUID: Total protein, fluid: <3 g/dL

## 2020-05-14 IMAGING — US IR PARACENTESIS
1 series · 2 of 2 positions shown · non-contrast
Comparison: none

INDICATION: Patient history of NASH cirrhosis with recurrent ascites presents
for therapeutic and diagnostic paracentesis. Maximum 6 L

[Series 1: (id) · 2 of 2 slices shown]
[im 1/2]
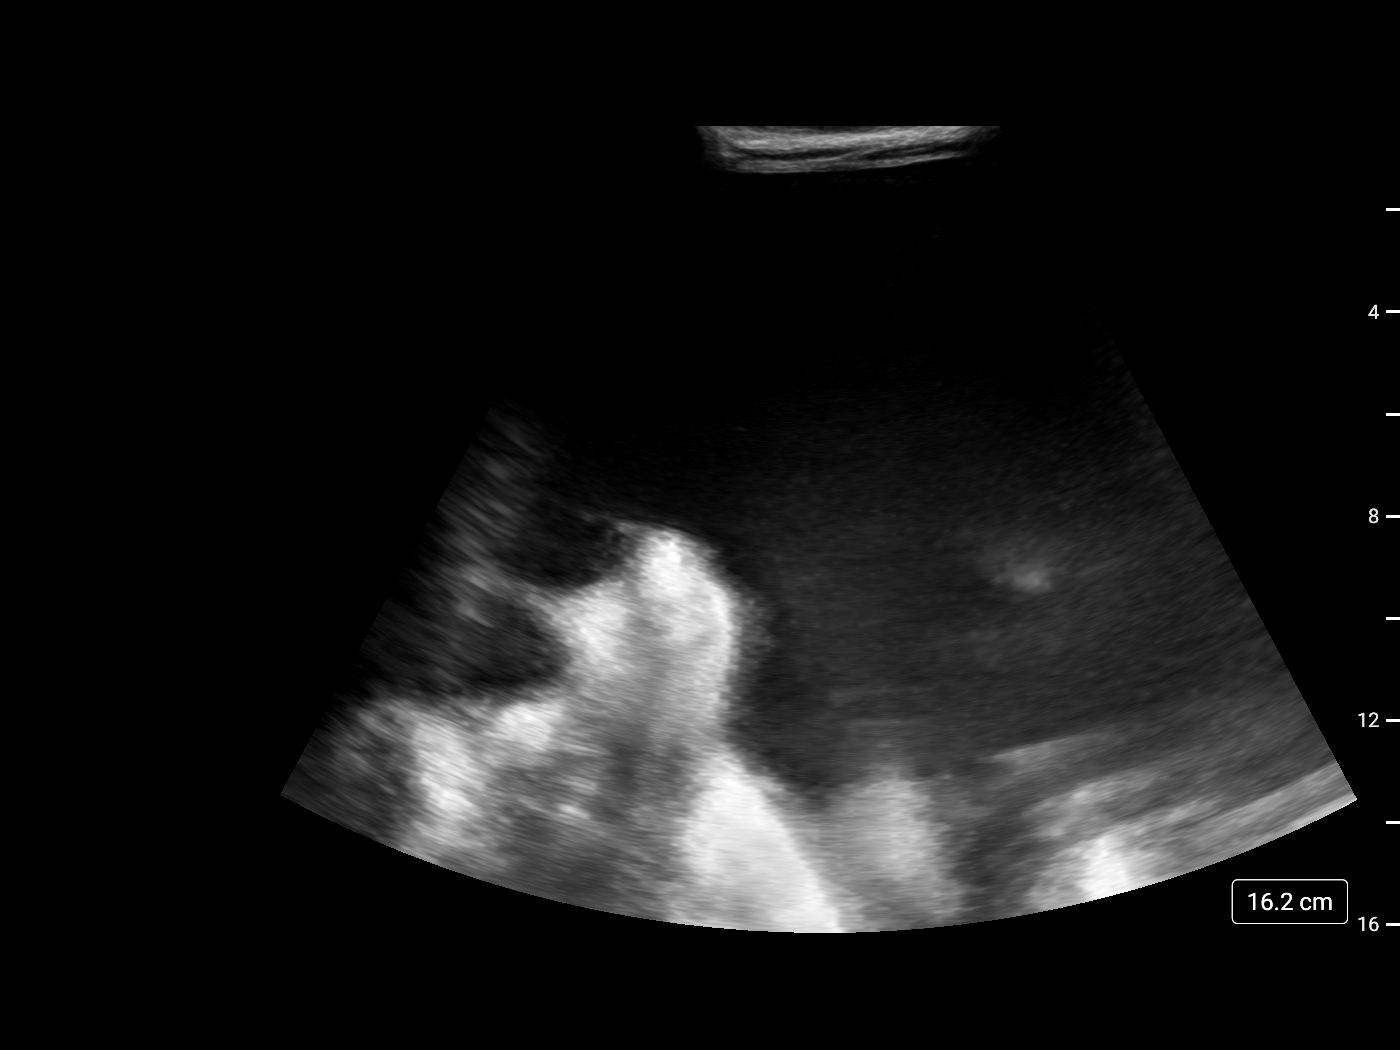
[im 2/2]
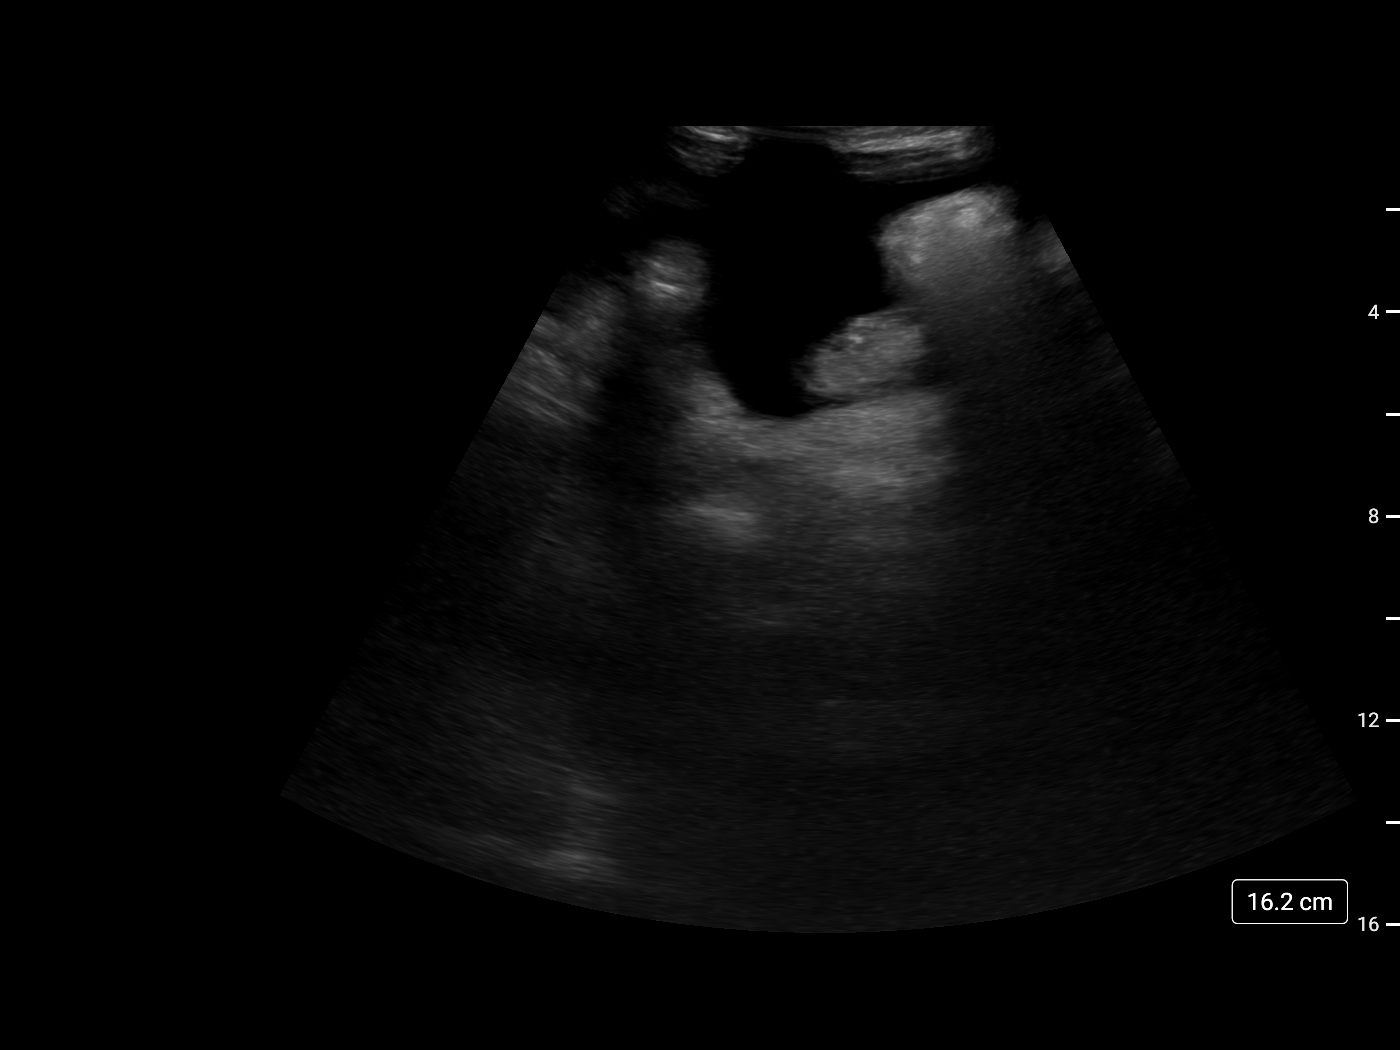

[2 of 2 positions shown; findings below may reference images not displayed]

EXAM:
ULTRASOUND GUIDED THERAPEUTIC AND DIAGNOSTIC PARACENTESIS

MEDICATIONS:
Lidocaine 1% 10 mL

COMPLICATIONS:
None immediate.

PROCEDURE:
Informed written consent was obtained from the patient after a
discussion of the risks, benefits and alternatives to treatment. A
timeout was performed prior to the initiation of the procedure.

Initial ultrasound scanning demonstrates a large amount of ascites
within the right lower abdominal quadrant. The right lower abdomen
was prepped and draped in the usual sterile fashion. 1% lidocaine
was used for local anesthesia.

Following this, a 6 Fr Safe-T-Centesis catheter was introduced. An
ultrasound image was saved for documentation purposes. The
paracentesis was performed. The catheter was removed and a dressing
was applied. The patient tolerated the procedure well without
immediate post procedural complication.
Patient received post-procedure intravenous albumin; see nursing
notes for details.
FINDINGS: A total of approximately 6 L of cloudy yellow fluid was removed.
Samples were sent to the laboratory as requested by the clinical
team.
IMPRESSION: Successful ultrasound-guided therapeutic and diagnostic paracentesis
yielding 6 liters of peritoneal fluid.

Read by: KOSTECKIS, NP

## 2020-05-14 MED ORDER — SODIUM CHLORIDE 0.9 % IV SOLN
1.0000 g | INTRAVENOUS | Status: DC
  Administered 2020-05-14: 1 g via INTRAVENOUS
  Filled 2020-05-14: qty 10

## 2020-05-14 MED ORDER — POTASSIUM CHLORIDE 10 MEQ/100ML IV SOLN
10.0000 meq | INTRAVENOUS | Status: AC
  Administered 2020-05-13 – 2020-05-14 (×5): 10 meq via INTRAVENOUS
  Filled 2020-05-14 (×3): qty 100

## 2020-05-14 MED ORDER — LACTULOSE 10 GM/15ML PO SOLN
20.0000 g | Freq: Two times a day (BID) | ORAL | Status: DC
  Administered 2020-05-14 – 2020-05-15 (×3): 20 g via ORAL
  Filled 2020-05-14 (×3): qty 30

## 2020-05-14 MED ORDER — POTASSIUM CHLORIDE CRYS ER 20 MEQ PO TBCR
40.0000 meq | EXTENDED_RELEASE_TABLET | Freq: Once | ORAL | Status: AC
  Administered 2020-05-14: 40 meq via ORAL
  Filled 2020-05-14: qty 2

## 2020-05-14 MED ORDER — SPIRONOLACTONE 25 MG PO TABS
50.0000 mg | ORAL_TABLET | Freq: Every day | ORAL | Status: DC
  Administered 2020-05-15 – 2020-05-16 (×2): 50 mg via ORAL
  Filled 2020-05-14 (×2): qty 2

## 2020-05-14 MED ORDER — INSULIN ASPART 100 UNIT/ML ~~LOC~~ SOLN
0.0000 [IU] | Freq: Three times a day (TID) | SUBCUTANEOUS | Status: DC
  Administered 2020-05-14: 15 [IU] via SUBCUTANEOUS
  Administered 2020-05-15: 11 [IU] via SUBCUTANEOUS
  Administered 2020-05-15: 7 [IU] via SUBCUTANEOUS

## 2020-05-14 MED ORDER — LIDOCAINE HCL (PF) 1 % IJ SOLN
INTRAMUSCULAR | Status: DC | PRN
  Administered 2020-05-14: 5 mL

## 2020-05-14 MED ORDER — FUROSEMIDE 80 MG PO TABS
80.0000 mg | ORAL_TABLET | Freq: Every day | ORAL | Status: DC
  Administered 2020-05-15 – 2020-05-16 (×2): 80 mg via ORAL
  Filled 2020-05-14 (×2): qty 1

## 2020-05-14 MED ORDER — ALBUMIN HUMAN 25 % IV SOLN
50.0000 g | Freq: Once | INTRAVENOUS | Status: AC
  Administered 2020-05-14: 50 g via INTRAVENOUS
  Filled 2020-05-14: qty 200

## 2020-05-14 MED ORDER — RIFAXIMIN 550 MG PO TABS
550.0000 mg | ORAL_TABLET | Freq: Two times a day (BID) | ORAL | Status: DC
  Administered 2020-05-14 – 2020-05-16 (×5): 550 mg via ORAL
  Filled 2020-05-14 (×5): qty 1

## 2020-05-14 MED ORDER — LIDOCAINE HCL 1 % IJ SOLN
INTRAMUSCULAR | Status: AC
  Filled 2020-05-14: qty 20

## 2020-05-14 NOTE — Evaluation (Signed)
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 The patient had 8L of ascitic fluid removed off of his abdomen on 04/26/2020. Yesterday the patient's wife discussed the patient with Dr. Rush Landmark due to the patient's altered mental status and abdominal distention. She brought the patient to the ED due to decreasing level of consciousness.  Work up showed pt clinically encephalopathic.  Clinical Impression  Pt admitted with/for encephalopathy as complication of his cirrhosis.  Pt still generally supervision to mod I for mobility.  Pt currently limited functionally due to the problems listed below.  (see problems list.)  Pt will benefit from PT to maximize function and safety to be able to get home safely with available assist.     Follow Up Recommendations No PT follow up    Equipment Recommendations  None recommended by PT    Recommendations for Other Services       Precautions / Restrictions Precautions Precautions: Fall      Mobility  Bed Mobility               General bed mobility comments: sitting EOB on arrival  Transfers Overall transfer level: Modified independent                  Ambulation/Gait Ambulation/Gait assistance: Supervision Gait Distance (Feet): 250 Feet Assistive device: None Gait Pattern/deviations: Step-through pattern   Gait velocity interpretation: 1.31 - 2.62 ft/sec, indicative of limited community ambulator General Gait Details: generally steady with episode of deviation with scanning.  Able to speed up on cue, but guarded with insignificant change.  Stairs            Wheelchair Mobility    Modified Rankin (Stroke Patients Only)       Balance  Overall balance assessment: Needs assistance Sitting-balance support: No upper extremity supported Sitting balance-Leahy Scale: Good       Standing balance-Leahy Scale: Fair(to good)                               Pertinent Vitals/Pain Pain Assessment: No/denies pain    Home Living Family/patient expects to be discharged to:: Private residence Living Arrangements: Spouse/significant other Available Help at Discharge: Family;Available 24 hours/day Type of Home: House Home Access: Stairs to enter Entrance Stairs-Rails: Psychiatric nurse of Steps: 2 Home Layout: One level Home Equipment: Cane - single point      Prior Function Level of Independence: Independent with assistive device(s);Independent               Hand Dominance        Extremity/Trunk Assessment   Upper Extremity Assessment Upper Extremity Assessment: Overall WFL for tasks assessed    Lower Extremity Assessment Lower Extremity Assessment: Overall WFL for tasks assessed       Communication   Communication: No difficulties  Cognition Arousal/Alertness: Awake/alert Behavior During Therapy: WFL for tasks assessed/performed Overall Cognitive Status: Within Functional Limits for tasks assessed                                        General Comments  Exercises     Assessment/Plan    PT Assessment Patient needs continued PT services  PT Problem List Decreased strength;Decreased activity tolerance;Decreased balance;Decreased mobility;Cardiopulmonary status limiting activity       PT Treatment Interventions Gait training;Stair training;Functional mobility training;Therapeutic activities;Balance training;Patient/family education    PT Goals (Current goals can be found in the Care Plan section)  Acute Rehab PT Goals Patient Stated Goal: keep this all under control better. PT Goal Formulation: With patient Time For Goal Achievement:  05/21/20 Potential to Achieve Goals: Good    Frequency Min 3X/week   Barriers to discharge        Co-evaluation               AM-PAC PT "6 Clicks" Mobility  Outcome Measure Help needed turning from your back to your side while in a flat bed without using bedrails?: None Help needed moving from lying on your back to sitting on the side of a flat bed without using bedrails?: None Help needed moving to and from a bed to a chair (including a wheelchair)?: None Help needed standing up from a chair using your arms (e.g., wheelchair or bedside chair)?: None Help needed to walk in hospital room?: None Help needed climbing 3-5 steps with a railing? : None 6 Click Score: 24    End of Session   Activity Tolerance: Patient tolerated treatment well;Patient limited by fatigue Patient left: in bed;with call bell/phone within reach;with bed alarm set Nurse Communication: Mobility status PT Visit Diagnosis: Other abnormalities of gait and mobility (R26.89);Difficulty in walking, not elsewhere classified (R26.2)    Time: 9191-6606 PT Time Calculation (min) (ACUTE ONLY): 21 min   Charges:   PT Evaluation $PT Eval Moderate Complexity: 1 Mod          05/14/2020  Ginger Carne., PT Acute Rehabilitation Services [phone]  (pager) [phone]  (office)  [name] 05/14/2020, 5:23 PM

## 2020-05-14 NOTE — Progress Notes (Signed)
CRITICAL VALUE ALERT  Critical Value:  Potassium 2.7  Date & Time Notied:  05/14/2020 at 0444  Provider Notified: C. Fair  Orders Received/Actions taken: started iv potassium.

## 2020-05-14 NOTE — Telephone Encounter (Signed)
Spoke to patients wife this morning who states that patient went to [facility] on 03/13/20 by ambulance. Unable to view his chart at this time. Mrs Bonneau spoke with patient this morning and said he seemed alert and responsive and in good spirits.

## 2020-05-14 NOTE — Progress Notes (Addendum)
Daily Rounding Note  05/14/2020, 11:29 AM  LOS: 1 day   SUBJECTIVE:   Chief complaint: Altered mental status, hepatic encephalopathy.  S/p TIPS.  Ascites. Underwent 6 L paracentesis, no SBP on fluid analysis. Patient says his belly feels better it is less distended and any soreness has resolved. Tells me that at home he has been having about 5 or more loose stools per day. Has consistent edema in the right foot/lower extremity but the swelling in the left foot/lower extremity is new.  Neither is painful.  OBJECTIVE:         Vital signs in last 24 hours:    Temp:  [97.9 F (36.6 C)-99.1 F (37.3 C)] 97.9 F (36.6 C) (06/01 0800) Pulse Rate:  [89-100] 99 (06/01 1034) Resp:  [11-21] 17 (06/01 1030) BP: (101-133)/(61-83) 115/73 (06/01 1034) SpO2:  [95 %-100 %] 96 % (06/01 1030) Weight:  [109.7 kg] 109.7 kg (06/01 0703) Last BM Date: 05/13/20 Filed Weights   05/13/20 0855 05/14/20 0703  Weight: 111.6 kg 109.7 kg   General: Speech is a bit slow but accurate.  He is alert and appropriate. Heart: RRR.  No MRG.  S1, S2 present. Chest: Clear bilaterally without labored breathing or cough Abdomen: Soft, slightly distended.  Umbilical hernia more pronounced with abdominal muscular engagement.  No tenderness.  Bowel sounds active. Extremities: Left greater than right pedal edema.  Slight reticulated erythematous pattern on the top of the feet, not tender. Neuro/Psych: Oriented x3.  Speech is slow but clear.  No asterixis.  Intake/Output from previous day: 05/31 0701 - 06/01 0700 In: 223.3 [IV Piggyback:223.3] Out: 400 [Urine:400]  Intake/Output this shift: No intake/output data recorded.  Lab Results: Recent Labs    05/13/20 0900 05/14/20 0318  WBC 7.7 5.0  HGB 11.7* 10.2*  HCT 36.1* 30.9*  PLT 187 121*   BMET Recent Labs    05/13/20 0900 05/14/20 0318  NA 141 142  K 3.4* 2.7*  CL 101 102  CO2 28 30  GLUCOSE  197* 93  BUN 15 12  CREATININE 0.78 0.71  CALCIUM 8.9 8.7*   LFT Recent Labs    05/13/20 0900 05/14/20 0318  PROT 7.5 6.7  ALBUMIN 2.7* 2.9*  AST 46* 40  ALT 39 35  ALKPHOS 86 70  BILITOT 1.6* 2.2*   PT/INR Recent Labs    05/13/20 1140 05/14/20 0318  LABPROT 16.0* 17.1*  INR 1.3* 1.5*   Hepatitis Panel No results for input(s): HEPBSAG, HCVAB, HEPAIGM, HEPBIGM in the last 72 hours.  Studies/Results: IR Paracentesis  Result Date: 05/14/2020 INDICATION: Patient history of NASH cirrhosis with recurrent ascites presents for therapeutic and diagnostic paracentesis. Maximum 6 L EXAM: ULTRASOUND GUIDED THERAPEUTIC AND DIAGNOSTIC PARACENTESIS MEDICATIONS: Lidocaine 1% 10 mL COMPLICATIONS: None immediate. PROCEDURE: Informed written consent was obtained from the patient after a discussion of the risks, benefits and alternatives to treatment. A timeout was performed prior to the initiation of the procedure. Initial ultrasound scanning demonstrates a large amount of ascites within the right lower abdominal quadrant. The right lower abdomen was prepped and draped in the usual sterile fashion. 1% lidocaine was used for local anesthesia. Following this, a 6 Fr Safe-T-Centesis catheter was introduced. An ultrasound image was saved for documentation purposes. The paracentesis was performed. The catheter was removed and a dressing was applied. The patient tolerated the procedure well without immediate post procedural complication. Patient received post-procedure intravenous albumin; see nursing notes for details.  FINDINGS: A total of approximately 6 L of cloudy yellow fluid was removed. Samples were sent to the laboratory as requested by the clinical team. IMPRESSION: Successful ultrasound-guided therapeutic and diagnostic paracentesis yielding 6 liters of peritoneal fluid. Read by: Rushie Nyhan, NP Electronically Signed   By: Jerilynn Mages.  Shick M.D.   On: 05/14/2020 10:44    ASSESMENT:   *   NASH  cirrhosis, DX 01/2016.  Followed by Dr. Bryan Lemma and Charlaine Dalton at [facility] [place]  *    Variceal bleeds.  TIPS placed 03/12/2020, revised 04/11/2020 05/09/2020 ultrasound TIPS interrogation showed patent TIPS with relatively high velocities throughout, moderate ascites, hepatic cirrhosis. Propranolol 40 bid PTA.   Most recent EGD 07/22/2019 for follow-up of varices.  Findings of grade 1 distal esophageal varices.  Scars at site of previous banding in distal esophagus, a few scars with nonbleeding ulcers.  Esophagitis.  Medium amount of food residue in stomach.  Portal hypertensive gastropathy, nodular gastric antral and duodenal mucosa.  Most recent  *    Hepatic encephalopathy.  Ammonia 126.  Lactulose 20 gm tid PTA. Encephalopathy improved this morning.  *   Ascites.  Diuretic intolerant.  Unfortunately has not improved since TIPS was placed.  6 L paracentesis today (5th since post TIPS 3/30).  Requiring paras 2 to 3 x month before that.   SBP 01/23/2020, takes Cipro 750 mg weekly and no SBP since.    *   Normocytic anemia.  Once daily oral iron PTA.  *   Thrombocytopenia, non-critical.   *   Mild coagulopathy, INR 1.5.     PLAN   *   Added Rifaximin 550 mg po bid. begin 2 g sodium diet. Changed lactulose to 20 g bid.      Azucena Freed  05/14/2020, 11:29 AM Phone [phone]   Attending physician's note   I have taken an interval history, reviewed the chart and examined the patient. I agree with the Advanced Practitioner's note, impression and recommendations.   54 year old male with history of [name] cirrhosis, refractory ascites s/p TIPS with altered mental status, hepatic encephalopathy  MELD 14  Infectious work-up negative.  No SBP.  Refractory ascites and volume overload.  He is tolerating furosemide 40 mg twice daily.  Will switch to furosemide 80 mg daily and add low-dose Aldactone 50 mg daily.  He is hypokalemic, addition of Aldactone may help with that and also  with diuresis if his BUN /creatinine remain stable  Rifaximin 550 mg twice daily Continue lactulose with goal 3-4 bowel movements daily  K. Denzil Magnuson , MD [phone]

## 2020-05-14 NOTE — Procedures (Signed)
Ultrasound-guided diagnostic and therapeutic paracentesis performed yielding 6 liters of cloudy yellow colored fluid.  Fluid was sent to lab for analysis. No immediate complications. EBL is < 2 ml.

## 2020-05-14 NOTE — Progress Notes (Addendum)
PROGRESS NOTE        PATIENT DETAILS Name: Martin Strong Age: 54 y.o. Sex: male Date of Birth: 10-30-1966 Admit Date: 05/13/2020 Admitting Physician Ava Benny Lennert, DO TFT:DDUK, Ronn Melena, NP  Brief Narrative: Patient is a 54 y.o. 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  See below for further details.  Significant events: 5/31>> admit for confusion-secondary to acute hepatic encephalopathy.  Antimicrobial therapy: None  Microbiology data: 6/1>> ascitic fluid culture: Pending 5/31>> urine culture: Pending  Procedures : 6/1>> ultrasound-guided paracentesis  Consults: GI  DVT Prophylaxis : SCD's  Subjective: Much more awake and alert-feels weak.  Assessment/Plan: Hepatic encephalopathy: Improved-awake/alert-but weak.  Continue lactulose-titrate for at least 2-3 BMs a day-now on rifaximin.  Hypokalemia: Likely secondary to GI loss-continue to replete-recheck in a.m.  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  Continue propanolol, and Lasix-not sure why not on Aldactone in the outpatient setting.  Await further recommendations from gastroenterology.  DM-2: CBGs stable-continue to monitor closely-of insulin/oral hypoglycemic agents given borderline low CBGs.  Recent Labs    05/14/20 0327 05/14/20 0749 05/14/20 1145  GLUCAP 79 75 77   HTN: BP controlled-continue Lasix and propanolol.  Deconditioning/debility: Secondary to acute illness-needs PT evaluation to determine safe disposition.  Obesity: Estimated body mass index is 31.91 kg/m as calculated from the following:   Height as of this encounter: 6' 1"  (1.854 m).   Weight as of this encounter: 109.7 kg.    Diet: Diet Order            Diet Heart Room service appropriate? Yes; Fluid consistency: Thin  Diet  effective now               Code Status: Full code   Family Communication: None at bedside-we will update family over the next few days.  Disposition Plan: Status is: Inpatient  Remains inpatient appropriate because:Inpatient level of care appropriate due to severity of illness   Dispo: The patient is from: Home              Anticipated d/c is to: Home              Anticipated d/c date is: 1 day              Patient currently is not medically stable to d/c.  Barriers to Discharge: Resolving hepatic encephalopathy-awaiting ascites fluid culture/sensitivity-weak-awaiting evaluation by rehab services when safe disposition  Antimicrobial agents: Anti-infectives (From admission, onward)   Start     Dose/Rate Route Frequency Ordered Stop   05/14/20 1300  cefTRIAXone (ROCEPHIN) 1 g in sodium chloride 0.9 % 100 mL IVPB     1 g 200 mL/hr over 30 Minutes Intravenous Every 24 hours 05/14/20 1203     05/14/20 1200  rifaximin (XIFAXAN) tablet 550 mg     550 mg Oral 2 times daily 05/14/20 1145         Time spent: 25-minutes-Greater than 50% of this time was spent in counseling, explanation of diagnosis, planning of further management, and coordination of care.  MEDICATIONS: Scheduled Meds: . ferrous sulfate  325 mg Oral Daily  . furosemide  40 mg Oral BID  . lactulose  20 g Oral BID  . lidocaine      . pantoprazole  40 mg Oral Daily  . propranolol  20 mg Oral BID  . rifaximin  550 mg Oral BID   Continuous Infusions: . cefTRIAXone (ROCEPHIN)  IV 1 g (05/14/20 1258)   PRN Meds:.lidocaine (PF), ondansetron **OR** ondansetron (ZOFRAN) IV   PHYSICAL EXAM: Vital signs: Vitals:   05/14/20 0800 05/14/20 1030 05/14/20 1034 05/14/20 1200  BP:   115/73   Pulse:  98 99   Resp:  17    Temp: 97.9 F (36.6 C)   97.6 F (36.4 C)  TempSrc: Oral   Oral  SpO2:  96%    Weight:      Height:       Filed Weights   05/13/20 0855 05/14/20 0703  Weight: 111.6 kg 109.7 kg   Body  mass index is 31.91 kg/m.   Gen Exam:Alert awake-not in any distress HEENT:atraumatic, normocephalic Chest: B/L clear to auscultation anteriorly CVS:S1S2 regular Abdomen:soft non tender, some distention-dullness in flanks Extremities:++ edema Neurology: Non focal Skin: no rash  I have personally reviewed following labs and imaging studies  LABORATORY DATA: CBC: Recent Labs  Lab 05/13/20 0900 05/14/20 0318  WBC 7.7 5.0  NEUTROABS 5.5  --   HGB 11.7* 10.2*  HCT 36.1* 30.9*  MCV 90.9 90.6  PLT 187 121*    Basic Metabolic Panel: Recent Labs  Lab 05/13/20 0900 05/13/20 1200 05/14/20 0318 05/14/20 1023  NA 141  --  142 142  K 3.4*  --  2.7* 3.2*  CL 101  --  102 102  CO2 28  --  30 28  GLUCOSE 197*  --  93 77  BUN 15  --  12 12  CREATININE 0.78  --  0.71 0.68  CALCIUM 8.9  --  8.7* 8.4*  MG  --  2.7* 2.0  --     GFR: Estimated Creatinine Clearance: 140.3 mL/min (by C-G formula based on SCr of 0.68 mg/dL).  Liver Function Tests: Recent Labs  Lab 05/13/20 0900 05/14/20 0318  AST 46* 40  ALT 39 35  ALKPHOS 86 70  BILITOT 1.6* 2.2*  PROT 7.5 6.7  ALBUMIN 2.7* 2.9*   No results for input(s): LIPASE, AMYLASE in the last 168 hours. Recent Labs  Lab 05/13/20 0900  AMMONIA 126*    Coagulation Profile: Recent Labs  Lab 05/13/20 1140 05/14/20 0318  INR 1.3* 1.5*    Cardiac Enzymes: No results for input(s): CKTOTAL, CKMB, CKMBINDEX, TROPONINI in the last 168 hours.  BNP (last 3 results) No results for input(s): PROBNP in the last 8760 hours.  Lipid Profile: No results for input(s): CHOL, HDL, LDLCALC, TRIG, CHOLHDL, LDLDIRECT in the last 72 hours.  Thyroid Function Tests: No results for input(s): TSH, T4TOTAL, FREET4, T3FREE, THYROIDAB in the last 72 hours.  Anemia Panel: No results for input(s): VITAMINB12, FOLATE, FERRITIN, TIBC, IRON, RETICCTPCT in the last 72 hours.  Urine analysis:    Component Value Date/Time   COLORURINE YELLOW  05/13/2020 1443   APPEARANCEUR CLEAR 05/13/2020 1443   LABSPEC 1.025 05/13/2020 1443   PHURINE 5.0 05/13/2020 1443   GLUCOSEU >=500 (A) 05/13/2020 1443   HGBUR NEGATIVE 05/13/2020 1443   BILIRUBINUR NEGATIVE 05/13/2020 1443   KETONESUR NEGATIVE 05/13/2020 1443   PROTEINUR NEGATIVE 05/13/2020 1443   NITRITE NEGATIVE 05/13/2020 1443   LEUKOCYTESUR NEGATIVE 05/13/2020 1443    Sepsis Labs: Lactic Acid, Venous    Component Value Date/Time   LATICACIDVEN 1.0 01/23/2020 2100    MICROBIOLOGY: Recent Results (from the past 240 hour(s))  SARS Coronavirus 2 by RT PCR (hospital order, performed in [facility] hospital lab) Nasopharyngeal Nasopharyngeal Swab     Status: None   Collection Time: 05/13/20 12:17 PM   Specimen: Nasopharyngeal Swab  Result Value Ref Range Status   SARS Coronavirus 2 NEGATIVE NEGATIVE Final    Comment: (NOTE) SARS-CoV-2 target nucleic acids are NOT DETECTED. The SARS-CoV-2 RNA is generally detectable in upper and lower respiratory specimens during the acute phase of infection. The lowest concentration of SARS-CoV-2 viral copies this assay can detect is 250 copies / mL. A negative result does not preclude SARS-CoV-2 infection and should not be used as the sole basis for treatment or other patient management decisions.  A negative result may occur with improper specimen collection / handling, submission of specimen other than nasopharyngeal swab, presence of viral mutation(s) within the areas targeted by this assay, and inadequate number of viral copies (<250 copies / mL). A negative result must be combined with clinical observations, patient history, and epidemiological information. Fact Sheet for Patients:   StrictlyIdeas.no Fact Sheet for Healthcare Providers: BankingDealers.co.za This test is not yet approved or cleared  by the [place] FDA and has been authorized for detection and/or diagnosis of  SARS-CoV-2 by FDA under an Emergency Use Authorization (EUA).  This EUA will remain in effect (meaning this test can be used) for the duration of the COVID-19 declaration under Section 564(b)(1) of the Act, 21 U.S.C. section 360bbb-3(b)(1), unless the authorization is terminated or revoked sooner. Performed at [facility], [place] [address], [place], [place] [address]   Urine culture     Status: None   Collection Time: 05/13/20  2:44 PM   Specimen: Urine, Clean Catch  Result Value Ref Range Status   Specimen Description URINE, CLEAN CATCH  Final   Special Requests NONE  Final   Culture   Final    NO GROWTH Performed at [facility], [place] [address], [place], [place] [address]    Report Status 05/14/2020 FINAL  Final    RADIOLOGY STUDIES/RESULTS: IR Paracentesis  Result Date: 05/14/2020 INDICATION: Patient history of NASH cirrhosis with recurrent ascites presents for therapeutic and diagnostic paracentesis. Maximum 6 L EXAM: ULTRASOUND GUIDED THERAPEUTIC AND DIAGNOSTIC PARACENTESIS MEDICATIONS: Lidocaine 1% 10 mL COMPLICATIONS: None immediate. PROCEDURE: Informed written consent was obtained from the patient after a discussion of the risks, benefits and alternatives to treatment. A timeout was performed prior to the initiation of the procedure. Initial ultrasound scanning demonstrates a large amount of ascites within the right lower abdominal quadrant. The right lower abdomen was prepped and draped in the usual sterile fashion. 1% lidocaine was used for local anesthesia. Following this, a 6 Fr Safe-T-Centesis catheter was introduced. An ultrasound image was saved for documentation purposes. The paracentesis was performed. The catheter was removed and a dressing was applied. The patient tolerated the procedure well without immediate post procedural complication. Patient received post-procedure intravenous albumin; see nursing notes for details. FINDINGS: A total of approximately 6 L of  cloudy yellow fluid was removed. Samples were sent to the laboratory as requested by the clinical team. IMPRESSION: Successful ultrasound-guided therapeutic and diagnostic paracentesis yielding 6 liters of peritoneal fluid. Read by: Rushie Nyhan, NP Electronically Signed   By: Jerilynn Mages.  Shick M.D.   On: 05/14/2020 10:44     LOS: 1 day   Oren Binet, MD  [facility]    To contact the attending provider between 7A-7P or the covering provider during after hours  7P-7A, please log into the web site [email or web address] and access using universal [place] password for that web site. If you do not have the password, please call the hospital operator.  05/14/2020, 1:12 PM

## 2020-05-14 NOTE — Progress Notes (Signed)
Received page from RN about K level. Mag level ordered. Will give 40 mEq KCl IV, patient already has 20 mEq daily ordered. Repeat BMP 1000.  Barrington Ellison, MD [facility]

## 2020-05-15 ENCOUNTER — Telehealth: Payer: Self-pay | Admitting: Gastroenterology

## 2020-05-15 ENCOUNTER — Other Ambulatory Visit: Payer: Self-pay | Admitting: Physician Assistant

## 2020-05-15 DIAGNOSIS — K7581 Nonalcoholic steatohepatitis (NASH): Secondary | ICD-10-CM

## 2020-05-15 DIAGNOSIS — K7460 Unspecified cirrhosis of liver: Secondary | ICD-10-CM

## 2020-05-15 DIAGNOSIS — R188 Other ascites: Secondary | ICD-10-CM

## 2020-05-15 DIAGNOSIS — K7290 Hepatic failure, unspecified without coma: Secondary | ICD-10-CM

## 2020-05-15 DIAGNOSIS — R140 Abdominal distension (gaseous): Secondary | ICD-10-CM

## 2020-05-15 DIAGNOSIS — G9340 Encephalopathy, unspecified: Secondary | ICD-10-CM

## 2020-05-15 LAB — BASIC METABOLIC PANEL
Anion gap: 10 (ref 5–15)
BUN: 13 mg/dL (ref 6–20)
CO2: 28 mmol/L (ref 22–32)
Calcium: 8.2 mg/dL — ABNORMAL LOW (ref 8.9–10.3)
Chloride: 100 mmol/L (ref 98–111)
Creatinine, Ser: 0.9 mg/dL (ref 0.61–1.24)
GFR calc Af Amer: >60 mL/min (ref >60)
GFR calc non Af Amer: >60 mL/min (ref >60)
Glucose, Bld: 288 mg/dL — ABNORMAL HIGH (ref 70–99)
Potassium: 3.5 mmol/L (ref 3.5–5.1)
Sodium: 138 mmol/L (ref 135–145)

## 2020-05-15 LAB — MAGNESIUM: Magnesium: 2 mg/dL (ref 1.7–2.4)

## 2020-05-15 LAB — PATHOLOGIST SMEAR REVIEW

## 2020-05-15 LAB — GLUCOSE, CAPILLARY
Glucose-Capillary: 107 mg/dL — ABNORMAL HIGH (ref 70–99)
Glucose-Capillary: 116 mg/dL — ABNORMAL HIGH (ref 70–99)
Glucose-Capillary: 152 mg/dL — ABNORMAL HIGH (ref 70–99)
Glucose-Capillary: 197 mg/dL — ABNORMAL HIGH (ref 70–99)
Glucose-Capillary: 204 mg/dL — ABNORMAL HIGH (ref 70–99)
Glucose-Capillary: 283 mg/dL — ABNORMAL HIGH (ref 70–99)

## 2020-05-15 LAB — PH, BODY FLUID: pH, Body Fluid: 8

## 2020-05-15 LAB — MRSA PCR SCREENING: MRSA by PCR: POSITIVE — AB

## 2020-05-15 MED ORDER — POTASSIUM CHLORIDE CRYS ER 20 MEQ PO TBCR
40.0000 meq | EXTENDED_RELEASE_TABLET | Freq: Once | ORAL | Status: AC
  Administered 2020-05-15: 40 meq via ORAL
  Filled 2020-05-15: qty 2

## 2020-05-15 MED ORDER — LACTULOSE 10 GM/15ML PO SOLN
20.0000 g | Freq: Three times a day (TID) | ORAL | Status: DC
  Administered 2020-05-15 – 2020-05-16 (×3): 20 g via ORAL
  Filled 2020-05-15 (×3): qty 30

## 2020-05-15 MED ORDER — INSULIN ASPART 100 UNIT/ML ~~LOC~~ SOLN
0.0000 [IU] | Freq: Three times a day (TID) | SUBCUTANEOUS | Status: DC
  Administered 2020-05-15 – 2020-05-16 (×2): 4 [IU] via SUBCUTANEOUS

## 2020-05-15 MED ORDER — MUPIROCIN 2 % EX OINT
1.0000 "application " | TOPICAL_OINTMENT | Freq: Two times a day (BID) | CUTANEOUS | Status: DC
  Administered 2020-05-15 – 2020-05-16 (×3): 1 via NASAL
  Filled 2020-05-15: qty 22

## 2020-05-15 MED ORDER — CHLORHEXIDINE GLUCONATE CLOTH 2 % EX PADS
6.0000 | MEDICATED_PAD | Freq: Every day | CUTANEOUS | Status: DC
  Administered 2020-05-16: 6 via TOPICAL

## 2020-05-15 NOTE — Progress Notes (Addendum)
Daily Rounding Note  05/15/2020, 10:22 AM  LOS: 2 days   SUBJECTIVE:   Chief complaint: Hepatic encephalopathy.  Ascites.  Previous TIPS.   Dr Darnell Level planning to send pt home today.  When seen earlier today the patient apparently was walking in the hallway and fully alert.  OBJECTIVE:         Vital signs in last 24 hours:    Temp:  [97.6 F (36.4 C)-98.9 F (37.2 C)] 97.9 F (36.6 C) (06/02 0808) Pulse Rate:  [83-99] 86 (06/02 0808) Resp:  [16-21] 16 (06/02 0808) BP: (100-115)/(49-73) 100/65 (06/02 0808) SpO2:  [91 %-98 %] 94 % (06/02 0808) Last BM Date: 05/15/20 Filed Weights   05/13/20 0855 05/14/20 0703  Weight: 111.6 kg 109.7 kg   General: Somnolent, somewhat difficult to arouse.  Comfortable. Heart: RRR. Chest: Clear bilaterally Abdomen: Significant distention/protuberance but soft.  Not tender.  Active bowel sounds. Extremities: Lower extremity edema Neuro/Psych: No asterixis but patient is somnolent and has slow psychomotor functioning, speech is slow.  He is not confused.  Seems somewhat encephalopathic  Intake/Output from previous day: 06/01 0701 - 06/02 0700 In: 960 [P.O.:960] Out: 3450 [Urine:3450]  Intake/Output this shift: Total I/O In: 120 [P.O.:120] Out: -   Lab Results: Recent Labs    05/13/20 0900 05/14/20 0318  WBC 7.7 5.0  HGB 11.7* 10.2*  HCT 36.1* 30.9*  PLT 187 121*   BMET Recent Labs    05/13/20 0900 05/14/20 0318 05/14/20 1023  NA 141 142 142  K 3.4* 2.7* 3.2*  CL 101 102 102  CO2 28 30 28   GLUCOSE 197* 93 77  BUN 15 12 12   CREATININE 0.78 0.71 0.68  CALCIUM 8.9 8.7* 8.4*   LFT Recent Labs    05/13/20 0900 05/14/20 0318  PROT 7.5 6.7  ALBUMIN 2.7* 2.9*  AST 46* 40  ALT 39 35  ALKPHOS 86 70  BILITOT 1.6* 2.2*   PT/INR Recent Labs    05/13/20 1140 05/14/20 0318  LABPROT 16.0* 17.1*  INR 1.3* 1.5*   Hepatitis Panel No results for input(s): HEPBSAG,  HCVAB, HEPAIGM, HEPBIGM in the last 72 hours.  Studies/Results: IR Paracentesis  Result Date: 05/14/2020 INDICATION: Patient history of NASH cirrhosis with recurrent ascites presents for therapeutic and diagnostic paracentesis. Maximum 6 L EXAM: ULTRASOUND GUIDED THERAPEUTIC AND DIAGNOSTIC PARACENTESIS MEDICATIONS: Lidocaine 1% 10 mL COMPLICATIONS: None immediate. PROCEDURE: Informed written consent was obtained from the patient after a discussion of the risks, benefits and alternatives to treatment. A timeout was performed prior to the initiation of the procedure. Initial ultrasound scanning demonstrates a large amount of ascites within the right lower abdominal quadrant. The right lower abdomen was prepped and draped in the usual sterile fashion. 1% lidocaine was used for local anesthesia. Following this, a 6 Fr Safe-T-Centesis catheter was introduced. An ultrasound image was saved for documentation purposes. The paracentesis was performed. The catheter was removed and a dressing was applied. The patient tolerated the procedure well without immediate post procedural complication. Patient received post-procedure intravenous albumin; see nursing notes for details. FINDINGS: A total of approximately 6 L of cloudy yellow fluid was removed. Samples were sent to the laboratory as requested by the clinical team. IMPRESSION: Successful ultrasound-guided therapeutic and diagnostic paracentesis yielding 6 liters of peritoneal fluid. Read by: Rushie Nyhan, NP Electronically Signed   By: Jerilynn Mages.  Shick M.D.   On: 05/14/2020 10:44   Scheduled Meds: . ferrous sulfate  325 mg Oral Daily  . furosemide  80 mg Oral Daily  . insulin aspart  0-20 Units Subcutaneous TID PC & HS  . lactulose  20 g Oral BID  . pantoprazole  40 mg Oral Daily  . propranolol  20 mg Oral BID  . rifaximin  550 mg Oral BID  . spironolactone  50 mg Oral Daily   Continuous Infusions: PRN Meds:.lidocaine (PF), ondansetron **OR** ondansetron  (ZOFRAN) IV   ASSESMENT:   *   NASH cirrhosis, DX 01/2016.  Followed by Dr. 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 Propranolol 40 bid PTA.   Most recent EGD 07/22/2019 for follow-up of varices.  Findings of grade 1 distal esophageal varices.  Scars at site of previous banding in distal esophagus, a few scars with nonbleeding ulcers.  Esophagitis.  Medium amount of food residue in stomach.  Portal hypertensive gastropathy, nodular gastric antral and duodenal mucosa.  Most recent  *    Hepatic encephalopathy.  Ammonia 126.  Lactulose 20 gm tid and ~ 5 BM's/day PTA.   Encephalopathy improved before Rifaximin added on 6/1 .  *   Ascites.  Diuretic intolerant.  Unfortunately has not improved since TIPS placed.  6 L paracentesis today (5th since post TIPS 3/30). Requiring paras 2 to 3 x month before that.   SBP 01/23/2020, takes Cipro 750 mg weekly and no SBP since.   Current diuretics are Lasix 80 mg/day, Aldactone 50 mg/day  *   Normocytic anemia.  Once daily oral iron PTA.  *   Thrombocytopenia, non-critical.   *   Mild coagulopathy, INR 1.5.     *   DM.    PLAN   *    Based on his level of somnolence when I just saw him in his room, I am concerned that the encephalopathy is not resolved enough for him to discharge.  Plan to keep him for 1 more day.  Has upcoming follow-up with Dr. Bryan Lemma on 6/24.  Can go to lab for cmet next week, ordered.    *    Put the lactulose dose back up to 3 times daily. Be met in the morning    Azucena Freed  05/15/2020, 10:22 AM Phone [phone]   Attending physician's note   I have taken an interval history, reviewed the chart and examined the patient. I agree with the Advanced Practitioner's note, impression and recommendations.    Will monitor response to diuretic changes Continue Xifaxan and lactulose Patient likely can be discharged home tomorrow  Will need follow-up BMP as outpatient in 5 days and follow-up with Dr. Bryan Lemma in  to 3 weeks as scheduled  K. Denzil Magnuson , MD [phone]

## 2020-05-15 NOTE — Discharge Summary (Addendum)
PATIENT DETAILS Name: Martin Strong Age: 54 y.o. Sex: male Date of Birth: 1966-10-16 MRN: [id number]. Admitting Physician: Karie Kirks, DO POE:UMPN, Ronn Melena, NP  Admit Date: 05/13/2020 Discharge date: 05/16/2020  Recommendations for Outpatient Follow-up:  1. Follow up with PCP in 1-2 weeks 2. Please obtain CMP/CBC in one week 3. Please ensure follow-up with gastroenterology. 4. Follow ascites fluid cultures until final.  Admitted From:  Home  Disposition: [place]: No  Equipment/Devices: None  Discharge Condition: Stable  CODE STATUS: FULL CODE  Diet recommendation:  Diet Order            Diet - low sodium heart healthy        Diet Carb Modified        Diet - low sodium heart healthy        Diet Carb Modified        Diet Carb Modified Fluid consistency: Thin; Room service appropriate? Yes  Diet effective now               Brief Narrative: Patient is a 54 y.o. 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  See below for further details.  Significant events: 5/31>> admit for confusion-secondary to acute hepatic encephalopathy.  Antimicrobial therapy: None  Microbiology data: 6/1>> ascitic fluid culture: no growth 5/31>> urine culture: no growth  Procedures : 6/1>> ultrasound-guided paracentesis  Consults: GI  Brief Hospital Course: Hepatic encephalopathy: Resolved-completely awake and alert-continue lactulose-rifaximin added.  Follow with PCP/primary gastroenterologist for further continued care.  Per [employer] does not cover rifaximin-they would work with the patient to see if they can provide him with resources.  Hypokalemia: Likely secondary to GI loss-repletesd  Anasarca/tense ascites s/p TIPS procedure-history of NASH liver cirrhosis: S/p paracentesis on 6/1-await culture sensitivity-preliminary data not consistent with SBP.    Continue propanolol and  other supportive care.  Follow-up with GI MD as an outpatient.  History of SBP February 2021: On ciprofloxacin weekly for prophylaxis.  History of esophageal varices with bleeding: S/p TIPS-on beta-blocker.  No evidence of GI bleeding during this hospitalization.  DM-2:  Initially CBGs on the lower normal side-however now has started to creep up as is clinical situation has improved-resume usual oral hypoglycemic agents on discharge.    HTN: BP controlled-continue Lasix and propanolol.  Deconditioning/debility: Secondary to acute illness-improved rapidly-Per PT-no outpatient services required.  Obesity: Estimated body mass index is 31.91 kg/m as calculated from the following:   Height as of this encounter: 6' 1"  (1.854 m).   Weight as of this encounter: 109.7 kg.    Discharge Diagnoses:  Active Problems:   Liver cirrhosis secondary to NASH (HCC)   Portosystemic encephalopathy (HCC)   Abdominal distention   Encephalopathy acute   Discharge Instructions:  Activity:  As tolerated  Discharge Instructions    Call MD for:  extreme fatigue   Complete by: As directed    Call MD for:  persistant dizziness or light-headedness   Complete by: As directed    Call MD for:  persistant nausea and vomiting   Complete by: As directed    Diet - low sodium heart healthy   Complete by: As directed    Diet - low sodium heart healthy   Complete by: As directed    Diet Carb Modified   Complete by: As directed    Diet Carb Modified   Complete by: As directed    Discharge instructions   Complete  by: As directed    Follow with Primary MD  [name], NP in 1-2 weeks  Follow with the primary gastroenterologist in 1-2 weeks.  Please get a complete blood count and chemistry panel checked by your Primary MD at your next visit, and again as instructed by your Primary MD.  Get Medicines reviewed and adjusted: Please take all your medications with you for your next visit with your  Primary MD  Laboratory/radiological data: Please request your Primary MD to go over all hospital tests and procedure/radiological results at the follow up, please ask your Primary MD to get all Hospital records sent to his/her office.  In some cases, they will be blood work, cultures and biopsy results pending at the time of your discharge. Please request that your primary care M.D. follows up on these results.  Also Note the following: If you experience worsening of your admission symptoms, develop shortness of breath, life threatening emergency, suicidal or homicidal thoughts you must seek medical attention immediately by calling 911 or calling your MD immediately  if symptoms less severe.  You must read complete instructions/literature along with all the possible adverse reactions/side effects for all the Medicines you take and that have been prescribed to you. Take any new Medicines after you have completely understood and accpet all the possible adverse reactions/side effects.   Do not drive when taking Pain medications or sleeping medications (Benzodaizepines)  Do not take more than prescribed Pain, Sleep and Anxiety Medications. It is not advisable to combine anxiety,sleep and pain medications without talking with your primary care practitioner  Special Instructions: If you have smoked or chewed Tobacco  in the last 2 yrs please stop smoking, stop any regular Alcohol  and or any Recreational drug use.  Wear Seat belts while driving.  Please note: You were cared for by a hospitalist during your hospital stay. Once you are discharged, your primary care physician will handle any further medical issues. Please note that NO REFILLS for any discharge medications will be authorized once you are discharged, as it is imperative that you return to your primary care physician (or establish a relationship with a primary care physician if you do not have one) for your post hospital discharge needs so  that they can reassess your need for medications and monitor your lab values.   Discharge instructions   Complete by: As directed    Follow with Primary MD  [name], NP in 1-2 weeks  Follow with your primary GI MD in 1 week.  Please get a complete blood count and chemistry panel checked by your Primary MD at your next visit, and again as instructed by your Primary MD.  Get Medicines reviewed and adjusted: Please take all your medications with you for your next visit with your Primary MD  Laboratory/radiological data: Please request your Primary MD to go over all hospital tests and procedure/radiological results at the follow up, please ask your Primary MD to get all Hospital records sent to his/her office.  In some cases, they will be blood work, cultures and biopsy results pending at the time of your discharge. Please request that your primary care M.D. follows up on these results.  Also Note the following: If you experience worsening of your admission symptoms, develop shortness of breath, life threatening emergency, suicidal or homicidal thoughts you must seek medical attention immediately by calling 911 or calling your MD immediately  if symptoms less severe.  You must read complete instructions/literature along  with all the possible adverse reactions/side effects for all the Medicines you take and that have been prescribed to you. Take any new Medicines after you have completely understood and accpet all the possible adverse reactions/side effects.   Do not drive when taking Pain medications or sleeping medications (Benzodaizepines)  Do not take more than prescribed Pain, Sleep and Anxiety Medications. It is not advisable to combine anxiety,sleep and pain medications without talking with your primary care practitioner  Special Instructions: If you have smoked or chewed Tobacco  in the last 2 yrs please stop smoking, stop any regular Alcohol  and or any Recreational drug  use.  Wear Seat belts while driving.  Please note: You were cared for by a hospitalist during your hospital stay. Once you are discharged, your primary care physician will handle any further medical issues. Please note that NO REFILLS for any discharge medications will be authorized once you are discharged, as it is imperative that you return to your primary care physician (or establish a relationship with a primary care physician if you do not have one) for your post hospital discharge needs so that they can reassess your need for medications and monitor your lab values.   Increase activity slowly   Complete by: As directed    Increase activity slowly   Complete by: As directed      Allergies as of 05/16/2020      Reactions   Nadolol Itching, Nausea Only      Medication List    TAKE these medications   cholecalciferol 1000 units tablet Commonly known as: VITAMIN D Take 1,000 Units by mouth daily.   ciprofloxacin 500 MG tablet Commonly known as: Cipro Take Cipro 750 mg weekly What changed:   how much to take  how to take this  when to take this  additional instructions   Farxiga 10 MG Tabs tablet Generic drug: dapagliflozin propanediol Take 10 mg by mouth at bedtime.   feeding supplement (ENSURE ENLIVE) Liqd Take 237 mLs by mouth 2 (two) times daily between meals.   ferrous sulfate 325 (65 FE) MG tablet Commonly known as: KP Ferrous Sulfate Take 1 tablet (325 mg total) by mouth daily with breakfast. What changed: when to take this   furosemide 40 MG tablet Commonly known as: LASIX Take 2 tablets (80 mg total) by mouth daily. What changed:   how much to take  when to take this   glipiZIDE 10 MG 24 hr tablet Commonly known as: GLUCOTROL XL Take 10 mg by mouth 2 (two) times daily.   lactulose 10 GM/15ML solution Commonly known as: CHRONULAC Take 30 mLs (20 g total) by mouth 3 (three) times daily.   levOCARNitine 330 MG tablet Commonly known as:  CARNITOR Take 330 mg by mouth 3 (three) times daily.   metFORMIN 1000 MG tablet Commonly known as: GLUCOPHAGE Take 1,000 mg by mouth 2 (two) times daily with a meal.   multivitamin with minerals Tabs tablet Take 1 tablet by mouth at bedtime.   pantoprazole 40 MG tablet Commonly known as: PROTONIX Take 40 mg by mouth daily.   potassium chloride 10 MEQ tablet Commonly known as: KLOR-CON Take 20 mEq by mouth daily.   propranolol 40 MG tablet Commonly known as: INDERAL Take 0.5 tablets (20 mg total) by mouth 2 (two) times daily. What changed: how much to take   rifaximin 550 MG Tabs tablet Commonly known as: XIFAXAN Take 1 tablet (550 mg total) by mouth 2 (two) times  daily.   spironolactone 50 MG tablet Commonly known as: ALDACTONE Take 1 tablet (50 mg total) by mouth daily.   Zinc 50 MG Tabs Take 50 mg by mouth daily.      Follow-up Information    Cirigliano, Vito V, DO Follow up.   Specialty: Gastroenterology Why: keep appt for 6/24 Contact information: [address] [place] [address] [phone]        [facility] Follow up on 05/22/2020.   Why: go to basement lab for blood draw next week, wed or thursday.  do not need to go to MD office that day, just lab.  Contact information: [address] [place] [address]       [name], NP. Schedule an appointment as soon as possible for a visit in 1 week(s).   Contact information: [place] [address] [phone]          Allergies  Allergen Reactions  . Nadolol Itching and Nausea Only      Other Procedures/Studies: US ABDOMINAL PELVIC ART/VENT FLOW DOPPLER  Result Date: 05/09/2020 CLINICAL DATA:  54 year old male with a history of NASH cirrhosis and recurrent large volume ascites. He underwent tips creation on 03/12/2020 and tips revision on 04/11/2020. EXAM: DUPLEX ULTRASOUND OF LIVER AND TIPS SHUNT TECHNIQUE: Color and  duplex Doppler ultrasound was performed to evaluate the hepatic in-flow and out-flow vessels. COMPARISON:  Prior tips duplex ultrasound 04/09/2020 FINDINGS: Portal Vein Velocities Main:  89 cm/sec Right:  54 cm/sec Left:  63 cm/sec TIPS Stent Velocities Proximal:  269 cm/sec Mid:  323 Distal:  350 cm/sec IVC: Present and patent with normal respiratory phasicity. Hepatic Vein Velocities Right: Not visualized Mid:  76 cm/sec Left: Not visualized Splenic Vein: 83 cm per second Superior Mesenteric Vein: 28 cm per second Hepatic Artery: 158 centimeters/second Ascites: Present/Absent Varices: Present/Absent Other findings: Moderate volume ascites. Small, heterogeneous and nodular liver consistent with known cirrhosis. The gallbladder is distended and filled with internal sludge and/or small stones. IMPRESSION: 1. Patent tips with relatively high velocities throughout. 2. Moderate ascites. 3. Hepatic cirrhosis. Signed, Criselda Peaches, MD, [facility] Rad[facility] Electronically Signed   By: Jacqulynn Cadet M.D.   On: 05/09/2020 11:04   IR Radiologist Eval & Mgmt  Result Date: 05/09/2020 Please refer to notes tab for details about interventional procedure. (Op Note)  IR Paracentesis  Result Date: 05/14/2020 INDICATION: Patient history of NASH cirrhosis with recurrent ascites presents for therapeutic and diagnostic paracentesis. Maximum 6 L EXAM: ULTRASOUND GUIDED THERAPEUTIC AND DIAGNOSTIC PARACENTESIS MEDICATIONS: Lidocaine 1% 10 mL COMPLICATIONS: None immediate. PROCEDURE: Informed written consent was obtained from the patient after a discussion of the risks, benefits and alternatives to treatment. A timeout was performed prior to the initiation of the procedure. Initial ultrasound scanning demonstrates a large amount of ascites within the right lower abdominal quadrant. The right lower abdomen was prepped and draped in the usual sterile fashion. 1%  lidocaine was used for local anesthesia. Following this, a 6 Fr Safe-T-Centesis catheter was introduced. An ultrasound image was saved for documentation purposes. The paracentesis was performed. The catheter was removed and a dressing was applied. The patient tolerated the procedure well without immediate post procedural complication. Patient received post-procedure intravenous albumin; see nursing notes for details. FINDINGS: A total of approximately 6 L of cloudy yellow fluid was removed. Samples were sent to the laboratory as requested by the clinical team.  IMPRESSION: Successful ultrasound-guided therapeutic and diagnostic paracentesis yielding 6 liters of peritoneal fluid. Read by: Rushie Nyhan, NP Electronically Signed   By: Jerilynn Mages.  Shick M.D.   On: 05/14/2020 10:44   IR Paracentesis  Result Date: 05/03/2020 INDICATION: Patient with history of NASH cirrhosis and recurrent ascites. Request is made for diagnostic and therapeutic paracentesis up to 8 L. EXAM: ULTRASOUND GUIDED DIAGNOSTIC AND THERAPEUTIC PARACENTESIS MEDICATIONS: 10 mL 1% lidocaine COMPLICATIONS: None immediate. PROCEDURE: Informed written consent was obtained from the patient after a discussion of the risks, benefits and alternatives to treatment. A timeout was performed prior to the initiation of the procedure. Initial ultrasound scanning demonstrates a large amount of ascites within the right lower abdominal quadrant. The right lower abdomen was prepped and draped in the usual sterile fashion. 1% lidocaine was used for local anesthesia. Following this, a 19 gauge, 7-cm, Yueh catheter was introduced. An ultrasound image was saved for documentation purposes. The paracentesis was performed. The catheter was removed and a dressing was applied. The patient tolerated the procedure well without immediate post procedural complication. Patient received post-procedure intravenous albumin; see nursing notes for details. FINDINGS: A total of  approximately 8.0 L of hazy yellow fluid was removed. Samples were sent to the laboratory as requested by the clinical team. IMPRESSION: Successful ultrasound-guided paracentesis yielding 8.0 L of peritoneal fluid. Read by: Earley Abide, PA-C Electronically Signed   By: Corrie Mckusick D.O.   On: 05/03/2020 10:04   IR Paracentesis  Result Date: 04/24/2020 INDICATION: Recurrent ascites EXAM: ULTRASOUND GUIDED RLQ PARACENTESIS MEDICATIONS: 10 cc 1% lidocaine COMPLICATIONS: None immediate. PROCEDURE: Informed written consent was obtained from the patient after a discussion of the risks, benefits and alternatives to treatment. A timeout was performed prior to the initiation of the procedure. Initial ultrasound scanning demonstrates a large amount of ascites within the right lower abdominal quadrant. The right lower abdomen was prepped and draped in the usual sterile fashion. 1% lidocaine was used for local anesthesia. Following this, a 19 G Yueh catheter was introduced. An ultrasound image was saved for documentation purposes. The paracentesis was performed. The catheter was removed and a dressing was applied. The patient tolerated the procedure well without immediate post procedural complication. Patient received post-procedure intravenous albumin; see nursing notes for details. FINDINGS: A total of approximately 6 liters of cloudy yellow fluid was removed. Samples were sent to the laboratory as requested by the clinical team. IMPRESSION: Successful ultrasound-guided paracentesis yielding 6 liters of peritoneal fluid. Read by Lavonia Drafts [facility] Electronically Signed   By: Lucrezia Europe M.D.   On: 04/24/2020 15:55     TODAY-DAY OF DISCHARGE:  Subjective:   Antron Seth today has no headache,no chest abdominal pain,no new weakness tingling or numbness, feels much better wants to go home today.   Objective:   Blood pressure 122/78, pulse 95, temperature 97.8 F (36.6 C), temperature source Oral, resp. rate  18, height 6' 1"  (1.854 m), weight 109.7 kg, SpO2 98 %.  Intake/Output Summary (Last 24 hours) at 05/16/2020 1009 Last data filed at 05/16/2020 0905 Gross per 24 hour  Intake 1190 ml  Output 525 ml  Net 665 ml   Filed Weights   05/13/20 0855 05/14/20 0703  Weight: 111.6 kg 109.7 kg    Exam: Awake Alert, Oriented *3, No new F.N deficits, Normal affect [place].AT,PERRAL Supple Neck,No JVD, No cervical lymphadenopathy appriciated.  Symmetrical Chest wall movement, Good air movement bilaterally, CTAB RRR,No Gallops,Rubs or new Murmurs, No Parasternal Heave +ve  B.Sounds, Abd Soft, Non tender, No organomegaly appriciated, No rebound -guarding or rigidity. No Cyanosis, Clubbing or edema, No new Rash or bruise   PERTINENT RADIOLOGIC STUDIES: No results found.   PERTINENT LAB RESULTS: CBC: Recent Labs    05/14/20 0318  WBC 5.0  HGB 10.2*  HCT 30.9*  PLT 121*   CMET CMP     Component Value Date/Time   NA 137 05/16/2020 0507   K 3.5 05/16/2020 0507   CL 104 05/16/2020 0507   CO2 26 05/16/2020 0507   GLUCOSE 205 (H) 05/16/2020 0507   BUN 13 05/16/2020 0507   CREATININE 0.78 05/16/2020 0507   CREATININE 0.68 (L) 04/04/2020 1109   CALCIUM 8.0 (L) 05/16/2020 0507   PROT 6.7 05/14/2020 0318   ALBUMIN 2.9 (L) 05/14/2020 0318   AST 40 05/14/2020 0318   ALT 35 05/14/2020 0318   ALKPHOS 70 05/14/2020 0318   BILITOT 2.2 (H) 05/14/2020 0318   GFRNONAA >60 05/16/2020 0507   GFRNONAA 110 04/04/2020 1109   GFRAA >60 05/16/2020 0507   GFRAA 127 04/04/2020 1109    GFR Estimated Creatinine Clearance: 140.3 mL/min (by C-G formula based on SCr of 0.78 mg/dL). No results for input(s): LIPASE, AMYLASE in the last 72 hours. No results for input(s): CKTOTAL, CKMB, CKMBINDEX, TROPONINI in the last 72 hours. Invalid input(s): POCBNP No results for input(s): DDIMER in the last 72 hours. Recent Labs    05/14/20 0318  HGBA1C 7.0*   No results for input(s): CHOL, HDL, LDLCALC, TRIG, CHOLHDL,  LDLDIRECT in the last 72 hours. No results for input(s): TSH, T4TOTAL, T3FREE, THYROIDAB in the last 72 hours.  Invalid input(s): FREET3 No results for input(s): VITAMINB12, FOLATE, FERRITIN, TIBC, IRON, RETICCTPCT in the last 72 hours. Coags: Recent Labs    05/13/20 1140 05/14/20 0318  INR 1.3* 1.5*   Microbiology: Recent Results (from the past 240 hour(s))  SARS Coronavirus 2 by RT PCR (hospital order, performed in [facility] hospital lab) Nasopharyngeal Nasopharyngeal Swab     Status: None   Collection Time: 05/13/20 12:17 PM   Specimen: Nasopharyngeal Swab  Result Value Ref Range Status   SARS Coronavirus 2 NEGATIVE NEGATIVE Final    Comment: (NOTE) SARS-CoV-2 target nucleic acids are NOT DETECTED. The SARS-CoV-2 RNA is generally detectable in upper and lower respiratory specimens during the acute phase of infection. The lowest concentration of SARS-CoV-2 viral copies this assay can detect is 250 copies / mL. A negative result does not preclude SARS-CoV-2 infection and should not be used as the sole basis for treatment or other patient management decisions.  A negative result may occur with improper specimen collection / handling, submission of specimen other than nasopharyngeal swab, presence of viral mutation(s) within the areas targeted by this assay, and inadequate number of viral copies (<250 copies / mL). A negative result must be combined with clinical observations, patient history, and epidemiological information. Fact Sheet for Patients:   StrictlyIdeas.no Fact Sheet for Healthcare Providers: BankingDealers.co.za This test is not yet approved or cleared  by the [place] FDA and has been authorized for detection and/or diagnosis of SARS-CoV-2 by FDA under an Emergency Use Authorization (EUA).  This EUA will remain in effect (meaning this test can be used) for the duration of the COVID-19 declaration under Section  564(b)(1) of the Act, 21 U.S.C. section 360bbb-3(b)(1), unless the authorization is terminated or revoked sooner. Performed at [facility], [place] [address], [place], [place] [address]   Urine culture  Status: None   Collection Time: 05/13/20  2:44 PM   Specimen: Urine, Clean Catch  Result Value Ref Range Status   Specimen Description URINE, CLEAN CATCH  Final   Special Requests NONE  Final   Culture   Final    NO GROWTH Performed at [facility], [address] [place], [place] [address]    Report Status 05/14/2020 FINAL  Final  Gram stain     Status: None   Collection Time: 05/14/20  9:55 AM   Specimen: Abdomen; Peritoneal Fluid  Result Value Ref Range Status   Specimen Description PERITONEAL FLUID  Final   Special Requests NONE  Final   Gram Stain   Final    NO WBC SEEN NO ORGANISMS SEEN Performed at [facility], [place] [address], [place], [place] [address]    Report Status 05/14/2020 FINAL  Final  Culture, body fluid-bottle     Status: None (Preliminary result)   Collection Time: 05/14/20  9:55 AM   Specimen: Peritoneal Washings  Result Value Ref Range Status   Specimen Description PERITONEAL FLUID  Final   Special Requests NONE  Final   Culture   Final    NO GROWTH < 24 HOURS Performed at [facility], [place] [address], [place], [place] [address]    Report Status PENDING  Incomplete  MRSA PCR Screening     Status: Abnormal   Collection Time: 05/15/20  9:36 AM   Specimen: Nasopharyngeal  Result Value Ref Range Status   MRSA by PCR POSITIVE (A) NEGATIVE Final    Comment:        The GeneXpert MRSA Assay (FDA approved for NASAL specimens only), is one component of a comprehensive MRSA colonization surveillance program. It is not intended to diagnose MRSA infection nor to guide or monitor treatment for MRSA infections. RESULT CALLED TO, READ BACK BY AND VERIFIED WITH: Jefferson Fuel RN 12:20 05/15/20 ([email or web address]) Performed at [facility] [place], [place] [address] [place],  [address]     FURTHER DISCHARGE INSTRUCTIONS:  Get Medicines reviewed and adjusted: Please take all your medications with you for your next visit with your Primary MD  Laboratory/radiological data: Please request your Primary MD to go over all hospital tests and procedure/radiological results at the ttafollow up, please ask your Primary MD to get all Hospital records sent to his/her office.  In some cases, they will be blood work, cultures and biopsy results pending at the time of your discharge. Please request that your primary care M.D. goes through all the records of your hospital data and follows up on these results.  Also Note the following: If you experience worsening of your admission symptoms, develop shortness of breath, life threatening emergency, suicidal or homicidal thoughts you must seek medical attention immediately by calling 911 or calling your MD immediately  if symptoms less severe.  You must read complete instructions/literature along with all the possible adverse reactions/side effects for all the Medicines you take and that have been prescribed to you. Take any new Medicines after you have completely understood and accpet all the possible adverse reactions/side effects.   Do not drive when taking Pain medications or sleeping medications (Benzodaizepines)  Do not take more than prescribed Pain, Sleep and Anxiety Medications. It is not advisable to combine anxiety,sleep and pain medications without talking with your primary care practitioner  Special Instructions: If you have smoked or chewed Tobacco  in the last 2 yrs please stop  smoking, stop any regular Alcohol  and or any Recreational drug use.  Wear Seat belts while driving.  Please note: You were cared for by a hospitalist during your hospital stay. Once you are discharged, your primary care physician will handle any further medical issues. Please note that NO REFILLS for any  discharge medications will be authorized once you are discharged, as it is imperative that you return to your primary care physician (or establish a relationship with a primary care physician if you do not have one) for your post hospital discharge needs so that they can reassess your need for medications and monitor your lab values.  Total Time spent coordinating discharge including counseling, education and face to face time equals 35 minutes.  SignedOren Binet 05/16/2020 10:09 AM

## 2020-05-15 NOTE — Progress Notes (Signed)
PROGRESS NOTE        PATIENT DETAILS Name: Martin Strong Age: 54 y.o. Sex: male Date of Birth: 24-Dec-1965 Admit Date: 05/13/2020 Admitting Physician Ava Benny Lennert, DO ZOX:WRUE, Ronn Melena, NP  Brief Narrative: Patient is a 54 y.o. 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  See below for further details.  Significant events: 5/31>> admit for confusion-secondary to acute hepatic encephalopathy.  Antimicrobial therapy: None  Microbiology data: 6/1>> ascitic fluid culture: no growth 5/31>> urine culture: no growth  Procedures : 6/1>> ultrasound-guided paracentesis  Consults: GI  DVT Prophylaxis : SCD's  Subjective: Awake and alert when I saw him this morning-but per GI report-patient was sleepy/slightly lethargic when they saw him.  I subsequently saw him this afternoon-he is not as "perked up" as he was this morning-he is somewhat sleepy/somnolent-but still relatively awake and alert.  Assessment/Plan: Hepatic encephalopathy: Improved-but still somewhat somnolent-after discussion with GI- reasonable to keep 1 more day to ensure he does not progress further into full-blown encephalopathy.  Lactulose changed to 3 times daily dosing-continue rifaximin.  We will recheck ammonia levels with a.m. labs.   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  Continue propanolol, and Lasix-not sure why not on Aldactone in the outpatient setting.  Await further recommendations from gastroenterology.  DM-2: CBGs stable-slightly on the higher side-initially all oral agents were held-restarted SSI last evening.  Continue to follow and see how he does over the next few days.  Given lingering encephalopathy/somnolence-he may need to allow some amount of  permissive hyperglycemia.  Recent Labs    05/15/20 0415 05/15/20 0747 05/15/20 1141  GLUCAP 152* 204* 283*   HTN: BP controlled-continue Lasix and propanolol.  Deconditioning/debility: Secondary to acute illness-needs PT evaluation to determine safe disposition.  Obesity: Estimated body mass index is 31.91 kg/m as calculated from the following:   Height as of this encounter: 6' 1"  (1.854 m).   Weight as of this encounter: 109.7 kg.    Diet: Diet Order            Diet Carb Modified Fluid consistency: Thin; Room service appropriate? Yes  Diet effective now               Code Status: Full code   Family Communication: Spouse over the phone on 6/2  Disposition Plan: Status is: Inpatient  Remains inpatient appropriate because:Inpatient level of care appropriate due to severity of illness  Dispo: The patient is from: Home              Anticipated d/c is to: Home              Anticipated d/c date is: 1 day              Patient currently is not medically stable to d/c.  Barriers to Discharge: Lingering encephalopathy-  Antimicrobial agents: Anti-infectives (From admission, onward)   Start     Dose/Rate Route Frequency Ordered Stop   05/14/20 1300  cefTRIAXone (ROCEPHIN) 1 g in sodium chloride 0.9 % 100 mL IVPB  Status:  Discontinued     1 g 200 mL/hr over 30 Minutes Intravenous Every 24 hours 05/14/20 1203 05/14/20 1318   05/14/20 1200  rifaximin (XIFAXAN) tablet 550 mg  550 mg Oral 2 times daily 05/14/20 1145         Time spent: 25-minutes-Greater than 50% of this time was spent in counseling, explanation of diagnosis, planning of further management, and coordination of care.  MEDICATIONS: Scheduled Meds: . [START ON 05/16/2020] Chlorhexidine Gluconate Cloth  6 each Topical Q0600  . ferrous sulfate  325 mg Oral Daily  . furosemide  80 mg Oral Daily  . insulin aspart  0-20 Units Subcutaneous TID PC & HS  . lactulose  20 g Oral TID  . mupirocin ointment  1  application Nasal BID  . pantoprazole  40 mg Oral Daily  . propranolol  20 mg Oral BID  . rifaximin  550 mg Oral BID  . spironolactone  50 mg Oral Daily   Continuous Infusions:  PRN Meds:.lidocaine (PF), ondansetron **OR** ondansetron (ZOFRAN) IV   PHYSICAL EXAM: Vital signs: Vitals:   05/15/20 0000 05/15/20 0400 05/15/20 0808 05/15/20 1200  BP:  (!) 102/49 100/65 122/79  Pulse:  83 86 88  Resp:  (!) 21 16   Temp: 98.9 F (37.2 C) 98.7 F (37.1 C) 97.9 F (36.6 C) 98.8 F (37.1 C)  TempSrc: Oral  Oral Oral  SpO2:  91% 94% 95%  Weight:      Height:       Filed Weights   05/13/20 0855 05/14/20 0703  Weight: 111.6 kg 109.7 kg   Body mass index is 31.91 kg/m.   Gen Exam:Alert awake-not in any distress HEENT:atraumatic, normocephalic Chest: B/L clear to auscultation anteriorly CVS:S1S2 regular Abdomen:soft non tender, non distended Extremities:+ edema Neurology: Non focal Skin: no rash  I have personally reviewed following labs and imaging studies  LABORATORY DATA: CBC: Recent Labs  Lab 05/13/20 0900 05/14/20 0318  WBC 7.7 5.0  NEUTROABS 5.5  --   HGB 11.7* 10.2*  HCT 36.1* 30.9*  MCV 90.9 90.6  PLT 187 121*    Basic Metabolic Panel: Recent Labs  Lab 05/13/20 0900 05/13/20 1200 05/14/20 0318 05/14/20 1023 05/15/20 0918  NA 141  --  142 142 138  K 3.4*  --  2.7* 3.2* 3.5  CL 101  --  102 102 100  CO2 28  --  30 28 28   GLUCOSE 197*  --  93 77 288*  BUN 15  --  12 12 13   CREATININE 0.78  --  0.71 0.68 0.90  CALCIUM 8.9  --  8.7* 8.4* 8.2*  MG  --  2.7* 2.0  --  2.0    GFR: Estimated Creatinine Clearance: 124.7 mL/min (by C-G formula based on SCr of 0.9 mg/dL).  Liver Function Tests: Recent Labs  Lab 05/13/20 0900 05/14/20 0318  AST 46* 40  ALT 39 35  ALKPHOS 86 70  BILITOT 1.6* 2.2*  PROT 7.5 6.7  ALBUMIN 2.7* 2.9*   No results for input(s): LIPASE, AMYLASE in the last 168 hours. Recent Labs  Lab 05/13/20 0900  AMMONIA 126*     Coagulation Profile: Recent Labs  Lab 05/13/20 1140 05/14/20 0318  INR 1.3* 1.5*    Cardiac Enzymes: No results for input(s): CKTOTAL, CKMB, CKMBINDEX, TROPONINI in the last 168 hours.  BNP (last 3 results) No results for input(s): PROBNP in the last 8760 hours.  Lipid Profile: No results for input(s): CHOL, HDL, LDLCALC, TRIG, CHOLHDL, LDLDIRECT in the last 72 hours.  Thyroid Function Tests: No results for input(s): TSH, T4TOTAL, FREET4, T3FREE, THYROIDAB in the last 72 hours.  Anemia Panel: No results  for input(s): VITAMINB12, FOLATE, FERRITIN, TIBC, IRON, RETICCTPCT in the last 72 hours.  Urine analysis:    Component Value Date/Time   COLORURINE YELLOW 05/13/2020 1443   APPEARANCEUR CLEAR 05/13/2020 1443   LABSPEC 1.025 05/13/2020 1443   PHURINE 5.0 05/13/2020 1443   GLUCOSEU >=500 (A) 05/13/2020 1443   HGBUR NEGATIVE 05/13/2020 1443   BILIRUBINUR NEGATIVE 05/13/2020 1443   KETONESUR NEGATIVE 05/13/2020 1443   PROTEINUR NEGATIVE 05/13/2020 1443   NITRITE NEGATIVE 05/13/2020 1443   LEUKOCYTESUR NEGATIVE 05/13/2020 1443    Sepsis Labs: Lactic Acid, Venous    Component Value Date/Time   LATICACIDVEN 1.0 01/23/2020 2100    MICROBIOLOGY: Recent Results (from the past 240 hour(s))  SARS Coronavirus 2 by RT PCR (hospital order, performed in [facility] hospital lab) Nasopharyngeal Nasopharyngeal Swab     Status: None   Collection Time: 05/13/20 12:17 PM   Specimen: Nasopharyngeal Swab  Result Value Ref Range Status   SARS Coronavirus 2 NEGATIVE NEGATIVE Final    Comment: (NOTE) SARS-CoV-2 target nucleic acids are NOT DETECTED. The SARS-CoV-2 RNA is generally detectable in upper and lower respiratory specimens during the acute phase of infection. The lowest concentration of SARS-CoV-2 viral copies this assay can detect is 250 copies / mL. A negative result does not preclude SARS-CoV-2 infection and should not be used as the sole basis for treatment or  other patient management decisions.  A negative result may occur with improper specimen collection / handling, submission of specimen other than nasopharyngeal swab, presence of viral mutation(s) within the areas targeted by this assay, and inadequate number of viral copies (<250 copies / mL). A negative result must be combined with clinical observations, patient history, and epidemiological information. Fact Sheet for Patients:   StrictlyIdeas.no Fact Sheet for Healthcare Providers: BankingDealers.co.za This test is not yet approved or cleared  by the [place] FDA and has been authorized for detection and/or diagnosis of SARS-CoV-2 by FDA under an Emergency Use Authorization (EUA).  This EUA will remain in effect (meaning this test can be used) for the duration of the COVID-19 declaration under Section 564(b)(1) of the Act, 21 U.S.C. section 360bbb-3(b)(1), unless the authorization is terminated or revoked sooner. Performed at [facility], [place] [address], [place], [place] [address]   Urine culture     Status: None   Collection Time: 05/13/20  2:44 PM   Specimen: Urine, Clean Catch  Result Value Ref Range Status   Specimen Description URINE, CLEAN CATCH  Final   Special Requests NONE  Final   Culture   Final    NO GROWTH Performed at [facility], [place] [address], [place], [place] [address]    Report Status 05/14/2020 FINAL  Final  Gram stain     Status: None   Collection Time: 05/14/20  9:55 AM   Specimen: Abdomen; Peritoneal Fluid  Result Value Ref Range Status   Specimen Description PERITONEAL FLUID  Final   Special Requests NONE  Final   Gram Stain   Final    NO WBC SEEN NO ORGANISMS SEEN Performed at [facility], [place] [address] [place], [place] [address]    Report Status 05/14/2020 FINAL  Final  Culture, body fluid-bottle     Status: None (Preliminary result)   Collection Time: 05/14/20  9:55 AM    Specimen: Peritoneal Washings  Result Value Ref Range Status   Specimen Description PERITONEAL FLUID  Final   Special Requests NONE  Final  Culture   Final    NO GROWTH < 24 HOURS Performed at [facility], [place] [address] [place], [place] [address]    Report Status PENDING  Incomplete  MRSA PCR Screening     Status: Abnormal   Collection Time: 05/15/20  9:36 AM   Specimen: Nasopharyngeal  Result Value Ref Range Status   MRSA by PCR POSITIVE (A) NEGATIVE Final    Comment:        The GeneXpert MRSA Assay (FDA approved for NASAL specimens only), is one component of a comprehensive MRSA colonization surveillance program. It is not intended to diagnose MRSA infection nor to guide or monitor treatment for MRSA infections. RESULT CALLED TO, READ BACK BY AND VERIFIED WITH: Jefferson Fuel RN 12:20 05/15/20 ([email or web address]) Performed at [facility], [place] [address] [place], [place] [address]     RADIOLOGY STUDIES/RESULTS: IR Paracentesis  Result Date: 05/14/2020 INDICATION: Patient history of NASH cirrhosis with recurrent ascites presents for therapeutic and diagnostic paracentesis. Maximum 6 L EXAM: ULTRASOUND GUIDED THERAPEUTIC AND DIAGNOSTIC PARACENTESIS MEDICATIONS: Lidocaine 1% 10 mL COMPLICATIONS: None immediate. PROCEDURE: Informed written consent was obtained from the patient after a discussion of the risks, benefits and alternatives to treatment. A timeout was performed prior to the initiation of the procedure. Initial ultrasound scanning demonstrates a large amount of ascites within the right lower abdominal quadrant. The right lower abdomen was prepped and draped in the usual sterile fashion. 1% lidocaine was used for local anesthesia. Following this, a 6 Fr Safe-T-Centesis catheter was introduced. An ultrasound image was saved for documentation purposes. The paracentesis was performed. The catheter was removed and a dressing was applied. The patient tolerated the procedure well  without immediate post procedural complication. Patient received post-procedure intravenous albumin; see nursing notes for details. FINDINGS: A total of approximately 6 L of cloudy yellow fluid was removed. Samples were sent to the laboratory as requested by the clinical team. IMPRESSION: Successful ultrasound-guided therapeutic and diagnostic paracentesis yielding 6 liters of peritoneal fluid. Read by: Rushie Nyhan, NP Electronically Signed   By: Jerilynn Mages.  Shick M.D.   On: 05/14/2020 10:44     LOS: 2 days   Oren Binet, MD  [facility]    To contact the attending provider between 7A-7P or the covering provider during after hours 7P-7A, please log into the web site [email or web address] and access using universal [place] password for that web site. If you do not have the password, please call the hospital operator.  05/15/2020, 3:02 PM

## 2020-05-15 NOTE — Progress Notes (Signed)
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 The patient had 8L of ascitic fluid removed off of his abdomen on 04/26/2020. Yesterday the patient's wife discussed the patient with Dr. Rush Landmark due to the patient's altered mental status and abdominal distention. She brought the patient to the ED due to decreasing level of consciousness.  Work up showed pt clinically encephalopthic.    PT Comments    Pt states he feels weak today, but agreeable to OOB mobility. Pt requires no physical assist to mobilize, but close guard to supervision level for safety. Pt ambulated hallway distance without AD, with slow and steady gait. PT to continue to follow acutely, doing well with mobility but has a limited tolerance for activity.     Follow Up Recommendations  No PT follow up     Equipment Recommendations  None recommended by PT    Recommendations for Other Services       Precautions / Restrictions Precautions Precautions: Fall Restrictions Weight Bearing Restrictions: No    Mobility  Bed Mobility Overal bed mobility: Needs Assistance Bed Mobility: Supine to Sit;Sit to Supine     Supine to sit: Supervision Sit to supine: Supervision   General bed mobility comments: for safety, use of HOB elevation adn bedrails.  Transfers Overall transfer level: Needs assistance Equipment used: None Transfers: Sit to/from Stand Sit to Stand: Supervision         General transfer comment: for safety, increased time to rise and steady.  Ambulation/Gait Ambulation/Gait assistance: Supervision;Min guard Gait Distance (Feet): 170 Feet Assistive device: None Gait Pattern/deviations: Step-through pattern;Decreased  stride length Gait velocity: decr   General Gait Details: Supervision for safety, with occasional close guard for safety with directional changes. Slow and steady gait this day, VSS.   Stairs             Wheelchair Mobility    Modified Rankin (Stroke Patients Only)       Balance Overall balance assessment: Needs assistance Sitting-balance support: No upper extremity supported Sitting balance-Leahy Scale: Good     Standing balance support: No upper extremity supported;During functional activity Standing balance-Leahy Scale: Fair(to good)                              Cognition Arousal/Alertness: Awake/alert Behavior During Therapy: WFL for tasks assessed/performed Overall Cognitive Status: Within Functional Limits for tasks assessed                                        Exercises General Exercises - Lower Extremity Long Arc Quad: AROM;Both;20 reps;Seated    General Comments        Pertinent Vitals/Pain Pain Assessment: Faces Faces Pain Scale: Hurts a little bit Pain Location: back, "from laying in this bed" Pain Descriptors / Indicators: Sore Pain Intervention(s): Limited activity within patient's tolerance;Monitored during session;Repositioned    Home Living                      Prior Function            PT Goals (current goals  can now be found in the care plan section) Acute Rehab PT Goals Patient Stated Goal: keep this all under control better. PT Goal Formulation: With patient Time For Goal Achievement: 05/21/20 Potential to Achieve Goals: Good Progress towards PT goals: Progressing toward goals    Frequency    Min 3X/week      PT Plan Current plan remains appropriate    Co-evaluation              AM-PAC PT "6 Clicks" Mobility   Outcome Measure  Help needed turning from your back to your side while in a flat bed without using bedrails?: None Help needed moving from lying on your back to  sitting on the side of a flat bed without using bedrails?: None Help needed moving to and from a bed to a chair (including a wheelchair)?: None Help needed standing up from a chair using your arms (e.g., wheelchair or bedside chair)?: None Help needed to walk in hospital room?: None Help needed climbing 3-5 steps with a railing? : None 6 Click Score: 24    End of Session Equipment Utilized During Treatment: Gait belt Activity Tolerance: Patient tolerated treatment well;Patient limited by fatigue Patient left: in bed;with call bell/phone within reach;with bed alarm set Nurse Communication: Mobility status PT Visit Diagnosis: Other abnormalities of gait and mobility (R26.89);Difficulty in walking, not elsewhere classified (R26.2)     Time: 8016-5537 PT Time Calculation (min) (ACUTE ONLY): 16 min  Charges:  $Gait Training: 8-22 mins                     Panfilo Ketchum E, PT [place] Pager [phone]  Office [phone]   Roxine Caddy D Elonda Husky 05/15/2020, 4:38 PM

## 2020-05-16 DIAGNOSIS — E119 Type 2 diabetes mellitus without complications: Secondary | ICD-10-CM

## 2020-05-16 LAB — BASIC METABOLIC PANEL
Anion gap: 7 (ref 5–15)
BUN: 13 mg/dL (ref 6–20)
CO2: 26 mmol/L (ref 22–32)
Calcium: 8 mg/dL — ABNORMAL LOW (ref 8.9–10.3)
Chloride: 104 mmol/L (ref 98–111)
Creatinine, Ser: 0.78 mg/dL (ref 0.61–1.24)
GFR calc Af Amer: >60 mL/min (ref >60)
GFR calc non Af Amer: >60 mL/min (ref >60)
Glucose, Bld: 205 mg/dL — ABNORMAL HIGH (ref 70–99)
Potassium: 3.5 mmol/L (ref 3.5–5.1)
Sodium: 137 mmol/L (ref 135–145)

## 2020-05-16 LAB — AMMONIA: Ammonia: 50 umol/L — ABNORMAL HIGH (ref 9–35)

## 2020-05-16 LAB — GLUCOSE, CAPILLARY: Glucose-Capillary: 157 mg/dL — ABNORMAL HIGH (ref 70–99)

## 2020-05-16 MED ORDER — FUROSEMIDE 40 MG PO TABS: 80.0000 mg | ORAL_TABLET | Freq: Every day | ORAL | 0 refills | Status: DC

## 2020-05-16 MED ORDER — SPIRONOLACTONE 50 MG PO TABS: 50.0000 mg | ORAL_TABLET | Freq: Every day | ORAL | 0 refills | Status: DC

## 2020-05-16 MED ORDER — RIFAXIMIN 550 MG PO TABS: 550.0000 mg | ORAL_TABLET | Freq: Two times a day (BID) | ORAL | 0 refills | Status: DC

## 2020-05-16 MED FILL — SPIRONOLACTONE 50 MG TAB: 50 | 30 days supply | Qty: 30 | Fill #0

## 2020-05-16 MED FILL — FUROSEMIDE 40 MG TABLET: 40 | 30 days supply | Qty: 60 | Fill #0

## 2020-05-16 NOTE — Progress Notes (Signed)
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 The patient had 8L of ascitic fluid removed off of his abdomen on 04/26/2020. Yesterday the patient's wife discussed the patient with Dr. Rush Landmark due to the patient's altered mental status and abdominal distention. She brought the patient to the ED due to decreasing level of consciousness.  Work up showed pt clinically encephalopthic.    PT Comments    Pt progressing with mobility. Ready for DC home from PT standpoint.   Follow Up Recommendations  No PT follow up     Equipment Recommendations  None recommended by PT    Recommendations for Other Services       Precautions / Restrictions Precautions Precautions: Fall Restrictions Weight Bearing Restrictions: No    Mobility  Bed Mobility               General bed mobility comments: Pt sitting EOB  Transfers Overall transfer level: Needs assistance Equipment used: None Transfers: Sit to/from Stand Sit to Stand: Supervision         General transfer comment: supervision for lines and incr time to rise  Ambulation/Gait Ambulation/Gait assistance: Supervision Gait Distance (Feet): 200 Feet Assistive device: None Gait Pattern/deviations: Step-through pattern;Decreased stride length Gait velocity: decr Gait velocity interpretation: 1.31 - 2.62 ft/sec, indicative of limited community ambulator General Gait Details: supervision for lines/safety   Stairs             Wheelchair Mobility    Modified Rankin (Stroke Patients Only)       Balance Overall balance assessment: Mild deficits observed, not formally tested                                           Cognition Arousal/Alertness: Awake/alert Behavior During Therapy: WFL for tasks assessed/performed Overall Cognitive Status: Within Functional Limits for tasks assessed                                        Exercises      General Comments General comments (skin integrity, edema, etc.): VSS on RA      Pertinent Vitals/Pain Pain Assessment: No/denies pain    Home Living                      Prior Function            PT Goals (current goals can now be found in the care plan section) Progress towards PT goals: Progressing toward goals    Frequency           PT Plan Current plan remains appropriate    Co-evaluation              AM-PAC PT "6 Clicks" Mobility   Outcome Measure  Help needed turning from your back to your side while in a flat bed without using bedrails?: None Help needed moving from lying on your back to sitting on the side of a flat bed without using bedrails?: None Help needed moving to and from a bed  to a chair (including a wheelchair)?: None Help needed standing up from a chair using your arms (e.g., wheelchair or bedside chair)?: None Help needed to walk in hospital room?: None Help needed climbing 3-5 steps with a railing? : None 6 Click Score: 24    End of Session   Activity Tolerance: Patient tolerated treatment well Patient left: in bed;with call bell/phone within reach(sitting EOb)         Time: 5051-8335 PT Time Calculation (min) (ACUTE ONLY): 10 min  Charges:  $Gait Training: 8-22 mins                     [place] Pager [phone] Office [place] 05/16/2020, 2:34 PM

## 2020-05-16 NOTE — Progress Notes (Signed)
Patient was discharged home by MD order; discharged instructions review and give to patient with care notes. Medication was delivered to patient by TOC ; IV DIC; skin intact; patient will be escorted to the car by nurse tech via wheelchair.

## 2020-05-19 LAB — CULTURE, BODY FLUID W GRAM STAIN -BOTTLE: Culture: NO GROWTH

## 2020-05-22 ENCOUNTER — Other Ambulatory Visit: Payer: Self-pay | Admitting: Gastroenterology

## 2020-05-22 ENCOUNTER — Telehealth: Payer: Self-pay | Admitting: Gastroenterology

## 2020-05-22 MED ORDER — RIFAXIMIN 550 MG PO TABS: 550.0000 mg | ORAL_TABLET | Freq: Two times a day (BID) | ORAL | 0 refills | Status: DC

## 2020-05-22 NOTE — Telephone Encounter (Signed)
Spoke to patient. A work note has been typed up for him to provide to his employer. Xifaxan has been sent to [facility] in [place] per his request.

## 2020-05-27 ENCOUNTER — Encounter: Payer: Self-pay | Admitting: Gastroenterology

## 2020-05-27 NOTE — Telephone Encounter (Signed)
OPENED IN ERROR

## 2020-05-30 ENCOUNTER — Other Ambulatory Visit: Payer: BC Managed Care – PPO

## 2020-05-30 DIAGNOSIS — E60 Dietary zinc deficiency: Secondary | ICD-10-CM

## 2020-05-31 LAB — ZINC: Zinc: 40 ug/dL — ABNORMAL LOW (ref 60–130)

## 2020-06-06 ENCOUNTER — Encounter: Payer: Self-pay | Admitting: Gastroenterology

## 2020-06-06 ENCOUNTER — Ambulatory Visit (INDEPENDENT_AMBULATORY_CARE_PROVIDER_SITE_OTHER): Payer: BC Managed Care – PPO | Admitting: Gastroenterology

## 2020-06-06 VITALS — BP 140/70 | HR 79 | Temp 97.5°F | Ht 72.0 in | Wt 191.0 lb

## 2020-06-06 DIAGNOSIS — R188 Other ascites: Secondary | ICD-10-CM | POA: Diagnosis not present

## 2020-06-06 DIAGNOSIS — E60 Dietary zinc deficiency: Secondary | ICD-10-CM

## 2020-06-06 DIAGNOSIS — K7581 Nonalcoholic steatohepatitis (NASH): Secondary | ICD-10-CM

## 2020-06-06 DIAGNOSIS — I8500 Esophageal varices without bleeding: Secondary | ICD-10-CM

## 2020-06-06 DIAGNOSIS — K7460 Unspecified cirrhosis of liver: Secondary | ICD-10-CM | POA: Diagnosis not present

## 2020-06-06 DIAGNOSIS — K3189 Other diseases of stomach and duodenum: Secondary | ICD-10-CM

## 2020-06-06 DIAGNOSIS — K766 Portal hypertension: Secondary | ICD-10-CM | POA: Diagnosis not present

## 2020-06-06 DIAGNOSIS — M6284 Sarcopenia: Secondary | ICD-10-CM

## 2020-06-06 DIAGNOSIS — Z8619 Personal history of other infectious and parasitic diseases: Secondary | ICD-10-CM

## 2020-06-06 NOTE — Patient Instructions (Signed)
You have been scheduled for an abdominal ultrasound at [facility] Radiology (1st floor of hospital) on 06/19/20 at 1230pm. Please arrive 15 minutes prior to your appointment for registration. Make certain not to have anything to eat or drink 6 hours prior to your appointment. Should you need to reschedule your appointment, please contact radiology at [phone]. This test typically takes about 30 minutes to perform.  It was a pleasure to see you today!  Vito Cirigliano, D.O.

## 2020-06-06 NOTE — Progress Notes (Signed)
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 [facility] [place]. Cirrhosis complicated by esophageal varices with bleeding requiring multiple EGDs with banding in the past along with propranolol for dual prophylaxis.Developed large volume ascites in 09/2019. Previously diuretic intolerant, required serial large-volume paracentesis, TIPS placement with subsequent revision,and low-sodium diet, now back on diuretics.  Admitted 04/2020 with hepatic encephalopathy-start lactulose/rifaximin.Admitted with SBP and AKI in 01/2020. Also follows with [facility].   Cirrhosis Hx: - Etiology: NASH - Diagnosed 2017 - Liver BL:TJQZ - Complications: EV with admission for bleeding 09/2018 treated with EGD with EVL, large volume ascitesserial LVP starting 09/2019, SBP 01/2020, hepatic encephalopathy 5/20 -HepaticRx: Lactulose, rifaximin, Lasix 80 mg/day, Aldactone 25 mg/day -Previous hepatic Rx: Propranolol (dual prophy)and PPI discontinued due to SBP.  History of diuretic intolerance.  Follows with Nephrology-restarted Lasix 40 mg/day 03/2020, Aldactone 05/2020.   - [place] Screening:MRI 11/2019: Ascites, no HCC. AFP <0.9. -03/13/2020: TIPS procedure -04/11/2020: TIPS revision -05/09/2020: Patent TIPS with high velocity flow throughout -Annual lab check: Completed 05/2020 -HAV and HBV vaccine series started 05/2019 - MELD:14(fluctuates 11-14) - Child Pugh: B(9pts)  Endoscopic history: -EGD x3 in 2017 in [place], [place] for esophageal varices with banding -EGD (09/2018, Dr. Bryan Lemma): Grade 2 esophageal varices with stigmata of recent bleeding requiring banding x2  with complete eradication, portal hypertensive gastropathy without bleeding. - EGD (12/2018, Dr. Bryan Lemma): Grade II esophageal varices, 6 bands placed with deflation. Normal Z line at 46 cm, severe portal HTN gastropathy with contact oozing, small GOV1, normal duodenum. H pylori serologies negative -EGD (02/2019, Dr. Bryan Lemma): Grade 2 esophageal varices, 4 bands placed with complete eradication.Portal hypertensive gastropathy/2 adenopathy -EGD (07/2019, Dr. Bryan Lemma): Done for dysphagia. grade 1 esophageal varices, multiple post variceal banding scars in the lower third with 1 area of luminal deformity, likely consistent with prior deep banding site. The lumen was mildly narrowed, but easily traversed and no additional endoscopic dilation performed. Mild esophagitis at the GEJ, portal hypertensive gastropathy, peptic antritis/duodenitis. Papilla prominent.  HPI:     Patient is a 54 y.o. male presenting to the Gastroenterology Clinic for follow-up.  Was last seen by me on 04/23/20.  At that time, was having reaccumulation of ascites despite TIPS revision on 04/11/2020.  Was sent for repeat paracentesis, and has had serial paracentesis since that time on 5/12 (6 L), 5/21 (8 L) and 6/1 (inpatient; 6 L).   Admitted 5/31-6/3 with new hepatic encephalopathy.  Ultrasound on 05/09/2020 with patent TIPS with relatively high velocities throughout, moderate ascites.  Started on lactulose/rifaximin, resumed furosemide  80 mg/day and added Aldactone 50 mg/day.  Inpatient labs on 6/3 with normal BMP.  Today, he states tolerating medications without issue. Having 4-5 soft stools/day. Has f/u with [facility] in August.  Abdomen soft, improved from previous.  No LE edema.  Zinc low at 40, started on zinc repletion.  Review of systems:     No chest pain, no SOB, no fevers, no urinary sx   Past Medical History:  Diagnosis Date  . Anemia   . Arthritis   . Cirrhosis ([place])   . Diabetes mellitus  without complication ([place])   . Dyspnea   . Fatty liver   . GERD (  gastroesophageal reflux disease)   . GI bleed   . Hyperlipidemia   . Hypertension     Patient's surgical history, family medical history, social history, medications and allergies were all reviewed in Epic    Current Outpatient Medications  Medication Sig Dispense Refill  . cholecalciferol (VITAMIN D) 1000 units tablet Take 1,000 Units by mouth daily.     . ciprofloxacin (CIPRO) 500 MG tablet Take Cipro 750 mg weekly (Patient taking differently: Take 750 mg by mouth once a week. On Mondays) 8 tablet 3  . dapagliflozin propanediol (FARXIGA) 10 MG TABS tablet Take 10 mg by mouth at bedtime.    . feeding supplement, ENSURE ENLIVE, (ENSURE ENLIVE) LIQD Take 237 mLs by mouth 2 (two) times daily between meals. 237 mL 12  . ferrous sulfate (KP FERROUS SULFATE) 325 (65 FE) MG tablet Take 1 tablet (325 mg total) by mouth daily with breakfast. (Patient taking differently: Take 325 mg by mouth daily. ) 30 tablet 3  . furosemide (LASIX) 40 MG tablet Take 2 tablets (80 mg total) by mouth daily. 60 tablet 0  . glipiZIDE (GLUCOTROL XL) 10 MG 24 hr tablet Take 10 mg by mouth 2 (two) times daily.    Marland Kitchen lactulose (CHRONULAC) 10 GM/15ML solution Take 30 mLs (20 g total) by mouth 3 (three) times daily. 236 mL 0  . levOCARNitine (CARNITOR) 330 MG tablet Take 330 mg by mouth 3 (three) times daily.    . metFORMIN (GLUCOPHAGE) 1000 MG tablet Take 1,000 mg by mouth 2 (two) times daily with a meal.    . Multiple Vitamin (MULTIVITAMIN WITH MINERALS) TABS tablet Take 1 tablet by mouth at bedtime.    . pantoprazole (PROTONIX) 40 MG tablet Take 40 mg by mouth daily.    . potassium chloride (KLOR-CON) 10 MEQ tablet Take 20 mEq by mouth daily.     . propranolol (INDERAL) 40 MG tablet Take 0.5 tablets (20 mg total) by mouth 2 (two) times daily. (Patient taking differently: Take 40 mg by mouth 2 (two) times daily. ) 60 tablet 0  . spironolactone (ALDACTONE)  50 MG tablet Take 1 tablet (50 mg total) by mouth daily. 30 tablet 0  . Zinc 50 MG TABS Take 50 mg by mouth daily.     No current facility-administered medications for this visit.    Physical Exam:     BP 140/70 (BP Location: Left Arm, Patient Position: Sitting, Cuff Size: Normal)   Pulse 79   Temp (!) 97.5 F (36.4 C)   Ht 6' (1.829 m)   Wt 191 lb (86.6 kg)   BMI 25.90 kg/m   GENERAL:  Pleasant male in NAD PSYCH: : Cooperative, normal affect CARDIAC:  RRR, no murmur heard, no peripheral edema PULM: Normal respiratory effort, lungs CTA bilaterally, no wheezing ABDOMEN:  Soft, distended with slight fluid wave, but much improved from previous.  Nontender.   Musculoskeletal: Temporal wasting, muscle atrophy of the shoulders, arms NEURO: Alert and oriented x 3, no focal neurologic deficits   IMPRESSION and PLAN:    1)NASHCirrhosis- MELD 14 2) Ascites 3) SBP 01/2020 4) Esophageal varices 5) Portal hypertensive gastropathy 6) Sarcopenia 7)  Hepatic encephalopathy 8) Zinc deficiency  History ofNASH cirrhosis with decompensation as manifested by bleeding esophageal varices, large volume ascites, HE, and SBP.Underwent TIPS 03/13/2020, with subsequent ultrasound demonstrated no flow, so underwent TIPS revision on 04/11/20.   - Cipro 750 mg weekly for SBP prophylaxis -Resume Lasix 80 mg and Aldactone 25 mg daily -Continue  lactulose/rifaximin -Continue levocarnitine -Repeat BMP -To call and schedule follow-up in the Nephrology Clinic -PPI discontinued due to SBP -Resume low-sodium diet -UTD on vaccine series and labs -Nutrition referral previously discussed re: modifications to include branched chain amino acids for treatment of sarcopenia -Follow-up with [facility] as  scheduled -Could therapy possible benefit for evaluation at [facility] for living donor transplant? -Resume OTC zinc supplementation -RUQ [place] for [facility] screening -Requesting work  note. Previously completed note for his employer, but recommended obtaining disability paperwork from HR and I would be happy to complete on his behalf -Sees [facility] in Aug. To f/u with me after that appt, or sooner as needed  I spent 45 minutes of time, including in depth chart review, independent review of results as outlined above, communicating results with the patient directly, face-to-face time with the patient, coordinating care, ordering studies and medications as appropriate, and documentation.  [place] ,DO, FACG 06/06/2020, 3:20 PM

## 2020-06-14 ENCOUNTER — Other Ambulatory Visit: Payer: Self-pay | Admitting: Gastroenterology

## 2020-06-19 ENCOUNTER — Encounter (HOSPITAL_BASED_OUTPATIENT_CLINIC_OR_DEPARTMENT_OTHER): Payer: Self-pay

## 2020-06-19 ENCOUNTER — Ambulatory Visit (HOSPITAL_BASED_OUTPATIENT_CLINIC_OR_DEPARTMENT_OTHER)
Admission: RE | Admit: 2020-06-19 | Discharge: 2020-06-19 | Disposition: A | Discharge status: Home / Self Care | Payer: Self-pay | Source: Ambulatory Visit | Attending: Gastroenterology | Admitting: Gastroenterology

## 2020-06-19 ENCOUNTER — Other Ambulatory Visit: Payer: Self-pay

## 2020-06-19 DIAGNOSIS — K7460 Unspecified cirrhosis of liver: Secondary | ICD-10-CM

## 2020-06-19 DIAGNOSIS — R188 Other ascites: Secondary | ICD-10-CM

## 2020-08-14 ENCOUNTER — Encounter: Payer: Self-pay | Admitting: Gastroenterology

## 2020-08-14 ENCOUNTER — Other Ambulatory Visit (INDEPENDENT_AMBULATORY_CARE_PROVIDER_SITE_OTHER): Payer: 59

## 2020-08-14 ENCOUNTER — Ambulatory Visit: Payer: 59 | Admitting: Gastroenterology

## 2020-08-14 VITALS — BP 106/60 | HR 77 | Ht 72.0 in | Wt 194.1 lb

## 2020-08-14 DIAGNOSIS — Z8619 Personal history of other infectious and parasitic diseases: Secondary | ICD-10-CM | POA: Diagnosis not present

## 2020-08-14 DIAGNOSIS — K7581 Nonalcoholic steatohepatitis (NASH): Secondary | ICD-10-CM | POA: Diagnosis not present

## 2020-08-14 DIAGNOSIS — R188 Other ascites: Secondary | ICD-10-CM

## 2020-08-14 DIAGNOSIS — K766 Portal hypertension: Secondary | ICD-10-CM

## 2020-08-14 DIAGNOSIS — E60 Dietary zinc deficiency: Secondary | ICD-10-CM

## 2020-08-14 DIAGNOSIS — K7682 Hepatic encephalopathy: Secondary | ICD-10-CM

## 2020-08-14 DIAGNOSIS — K7460 Unspecified cirrhosis of liver: Secondary | ICD-10-CM

## 2020-08-14 DIAGNOSIS — M6284 Sarcopenia: Secondary | ICD-10-CM

## 2020-08-14 DIAGNOSIS — K7290 Hepatic failure, unspecified without coma: Secondary | ICD-10-CM

## 2020-08-14 DIAGNOSIS — I8500 Esophageal varices without bleeding: Secondary | ICD-10-CM

## 2020-08-14 DIAGNOSIS — K3189 Other diseases of stomach and duodenum: Secondary | ICD-10-CM

## 2020-08-14 LAB — BASIC METABOLIC PANEL
BUN: 21 mg/dL (ref 6–23)
CO2: 28 mEq/L (ref 19–32)
Calcium: 8.9 mg/dL (ref 8.4–10.5)
Chloride: 97 mEq/L (ref 96–112)
Creatinine, Ser: 0.94 mg/dL (ref 0.40–1.50)
GFR: 84.05 mL/min (ref >60.00)
Glucose, Bld: 521 mg/dL (ref 70–99)
Potassium: 3.9 mEq/L (ref 3.5–5.1)
Sodium: 132 mEq/L — ABNORMAL LOW (ref 135–145)

## 2020-08-14 LAB — HEMOGLOBIN A1C: Hgb A1c MFr Bld: 7.9 % — ABNORMAL HIGH (ref 4.6–6.5)

## 2020-08-14 MED ORDER — SPIRONOLACTONE 25 MG PO TABS: 25.0000 mg | ORAL_TABLET | Freq: Every day | ORAL | 5 refills | Status: DC

## 2020-08-14 NOTE — Progress Notes (Signed)
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 [facility] [place]. Cirrhosis complicated by esophageal varices with bleeding requiring multiple EGDs with banding in the past along with propranolol for dual prophylaxis.Developed large volume ascites in 09/2019. Previously diuretic intolerant, required serial large-volume paracentesis, TIPS placement with subsequent revision,and low-sodium diet, now back on diuretics. Admitted with SBP and AKI in 01/2020.  Admitted 04/2020 with hepatic encephalopathy-start lactulose/rifaximin.Follows with [facility].   Cirrhosis Hx: - Etiology: NASH - Diagnosed 2017 - Liver RU:EAVW - Complications: EV with admission for bleeding 09/2018 treated with EGD with EVL, large volume ascitesserial LVP starting 09/2019 TIPS 02/2020 (revision 03/2020), SBP 01/2020, hepatic encephalopathy 04/2020, sarcopenia -HepaticRx: Lactulose, rifaximin, Lasix 80 mg/day, Aldactone 25 mg/day, Cipro 750 mg/week, levocarnitine -Previous hepatic Rx: Propranolol (dual prophy)and PPI discontinued due to SBP.History of diuretic intolerance and AKI. Follows with Nephrology, managing Lasix/Aldactone - [facility] Screening:MRI 11/2019: Ascites, no HCC. AFP <0.9. -03/13/2020: TIPSprocedure -04/11/2020: TIPS revision -05/09/2020: Patent TIPS with high velocity flow throughout -Annual lab check: Completed 05/2020 -HAV and HBV vaccine series started 05/2019 - MELD:14(fluctuates 11-14) - Child Pugh: B(9pts)  Endoscopic history: -EGD x3 in 2017 in [place], [place] for esophageal varices with banding -EGD (09/2018, Dr. Bryan Lemma): Grade 2 esophageal varices with  stigmata of recent bleeding requiring banding x2 with complete eradication, portal hypertensive gastropathy without bleeding. - EGD (12/2018, Dr. Bryan Lemma): Grade II esophageal varices, 6 bands placed with deflation. Normal Z line at 46 cm, severe portal HTN gastropathy with contact oozing, small GOV1, normal duodenum. H pylori serologies negative -EGD (02/2019, Dr. Bryan Lemma): Grade 2 esophageal varices, 4 bands placed with complete eradication.Portal hypertensive gastropathy/2 adenopathy -EGD (07/2019, Dr. Bryan Lemma): Done for dysphagia. grade 1 esophageal varices, multiple post variceal banding scars in the lower third with 1 area of luminal deformity, likely consistent with prior deep banding site. The lumen was mildly narrowed, but easily traversed and no additional endoscopic dilation performed. Mild esophagitis at the GEJ, portal hypertensive gastropathy, peptic antritis/duodenitis. Papilla prominent.  HPI:     Patient is a 54 y.o. male presenting to the Gastroenterology Clinic for follow-up.  Last seen by me on 06/06/2020.  Earlier in June was admitted with new hepatic encephalopathy and moderate ascites despite patent TIPS.  Was started on lactulose/rifaximin, continued furosemide, and added Aldactone.  At the time of the last appointment, was tolerating all medications without issue.  Other than weakness, he had no complaints at that time.  Was started on zinc repletion.  Since last appointment, was seen in [facility] in August.  Today, he states he continues taking medications as prescribed.  Feels like he is in the bathroom all the time with the diuretics and lactulose.  Has had intermittent episodes of mid back that sometimes radiates into stomach with nausea and single episode of non-bloody emesis.  Otherwise, feels like he has had some increased energy since his last appointment.  Has stopped working; reports that he was let go from his job during his recent  hospitalization.  Subsequently had change in insurance, so was unable to go to his Hepatology appointment and ultrasound last month.  It was both rescheduled now as he has new insurance starting today.  No new  labs or imaging since last appointment.  Review of systems:     No chest pain, no SOB, no fevers, no urinary sx   Past Medical History:  Diagnosis Date  . Anemia   . Arthritis   . Cirrhosis ([place])   . Diabetes mellitus without complication ([place])   . Dyspnea   . Fatty liver   . GERD (gastroesophageal reflux disease)   . GI bleed   . Hyperlipidemia   . Hypertension     Patient's surgical history, family medical history, social history, medications and allergies were all reviewed in Epic    Current Outpatient Medications  Medication Sig Dispense Refill  . cholecalciferol (VITAMIN D) 1000 units tablet Take 1,000 Units by mouth daily.     . ciprofloxacin (CIPRO) 500 MG tablet Take Cipro 750 mg weekly (Patient taking differently: Take 750 mg by mouth once a week. On Mondays) 8 tablet 3  . dapagliflozin propanediol (FARXIGA) 10 MG TABS tablet Take 10 mg by mouth at bedtime.    . feeding supplement, ENSURE ENLIVE, (ENSURE ENLIVE) LIQD Take 237 mLs by mouth 2 (two) times daily between meals. 237 mL 12  . ferrous sulfate (KP FERROUS SULFATE) 325 (65 FE) MG tablet Take 1 tablet (325 mg total) by mouth daily with breakfast. (Patient taking differently: Take 325 mg by mouth daily. ) 30 tablet 3  . furosemide (LASIX) 40 MG tablet Take 2 tablets (80 mg total) by mouth daily. 60 tablet 0  . glipiZIDE (GLUCOTROL XL) 10 MG 24 hr tablet Take 10 mg by mouth 2 (two) times daily.    Marland Kitchen lactulose (CHRONULAC) 10 GM/15ML solution Take 30 mLs (20 g total) by mouth 3 (three) times daily. 236 mL 0  . levOCARNitine (CARNITOR) 330 MG tablet Take 330 mg by mouth 3 (three) times daily.    . metFORMIN (GLUCOPHAGE) 1000 MG tablet Take 1,000 mg by mouth 2 (two) times daily with a meal.    . Multiple Vitamin  (MULTIVITAMIN WITH MINERALS) TABS tablet Take 1 tablet by mouth at bedtime.    . pantoprazole (PROTONIX) 40 MG tablet Take 40 mg by mouth daily.    . potassium chloride (KLOR-CON) 10 MEQ tablet Take 20 mEq by mouth daily.     . propranolol (INDERAL) 40 MG tablet Take 0.5 tablets (20 mg total) by mouth 2 (two) times daily. (Patient taking differently: Take 40 mg by mouth 2 (two) times daily. ) 60 tablet 0  . spironolactone (ALDACTONE) 25 MG tablet Take 1 tablet (25 mg total) by mouth daily. 30 tablet 0  . Zinc 50 MG TABS Take 50 mg by mouth daily.    Marland Kitchen spironolactone (ALDACTONE) 25 MG tablet TAKE 1 TABLET BY MOUTH DAILY 90 tablet 3   No current facility-administered medications for this visit.    Physical Exam:     BP 106/60   Pulse 77   Ht 6' (1.829 m)   Wt 194 lb 2 oz (88.1 kg)   BMI 26.33 kg/m   GENERAL:  Pleasant male in NAD PSYCH: : Cooperative, normal affect EENT:  conjunctiva pink, mucous membranes moist, neck supple without masses CARDIAC:  RRR, no murmur heard, no peripheral edema PULM: Normal respiratory effort, lungs CTA bilaterally, no wheezing ABDOMEN:  Nondistended, soft, nontender. No obvious masses, no hepatomegaly,  normal bowel sounds Musculoskeletal: Decreased muscle tone through shoulder girdle, upper extremities.  Temporal wasting NEURO: Alert and oriented x 3, no focal neurologic deficits   IMPRESSION and PLAN:  1)NASHCirrhosis- MELD 14 2) Ascites 3) SBP 01/2020 4) Esophageal varices 5) Portal hypertensive gastropathy 6) Sarcopenia 7)Hepatic encephalopathy 8) Zinc deficiency  History ofNASH cirrhosis with decompensation as manifested by bleeding esophageal varices, large volume ascites, HE, and SBP.Underwent TIPS 03/13/2020, with subsequent ultrasound demonstrated no flow, so underwent TIPS revision on 04/11/20.   - Cipro 750 mg weekly for SBP prophylaxis -Resume Lasix 80 mg and Aldactone 25 mg daily -Placed refill for Aldactone 25 mg/day  today -Check BMP -Continue lactulose/rifaximin -Continue levocarnitine -To call and schedule routine follow-up in the Nephrology Clinic -PPI discontinued due to SBP -Resume low-sodium diet -UTD on vaccine series and labs -Nutrition referral previously discussed re: modifications to include branched chain amino acids for treatment of sarcopenia -Follow-up with [facility] as scheduled next month  -We discussed possible consideration for evaluation at [place] for living donor transplant? -Resume OTC zinc supplementation -RUQ [place] for [facility] screening rescheduled for this month -Check A1c and send result to [facility] Heide Scales, NP) per request  RTC in 3-4 months or sooner as needed  I spent 40 minutes of time, including in depth chart review, independent review of results as outlined above, communicating results with the patient directly, face-to-face time with the patient, coordinating care, ordering studies and medications as appropriate, and documentation.            Lavena Bullion ,DO, FACG 08/14/2020, 8:32 AM

## 2020-08-14 NOTE — Patient Instructions (Addendum)
If you are age 53 or older, your body mass index should be between 23-30. Your Body mass index is 26.33 kg/m. If this is out of the aforementioned range listed, please consider follow up with your Primary Care Provider.  If you are age 47 or younger, your body mass index should be between 19-25. Your Body mass index is 26.33 kg/m. If this is out of the aformentioned range listed, please consider follow up with your Primary Care Provider.   You have been scheduled for an abdominal ultrasound at [facility] (1st floor of hospital) on 08/20/20 at 9 am. Please arrive 15 minutes prior to your appointment for registration. Make certain not to have anything to eat or drink 6 hours prior to your appointment. Should you need to reschedule your appointment, please contact radiology at [phone]. This test typically takes about 30 minutes to perform.  Your provider has requested that you go to the basement level for lab work at [facility]. [place], [place] before leaving today. Press "B" on the elevator. The lab is located at the first door on the left as you exit the elevator.   Follow up with me in three months.  Please call the office for an appointment as the schedule is not available at this time.  It was a pleasure to see you today!  Vito Cirigliano, D.O.

## 2020-08-16 ENCOUNTER — Telehealth: Payer: Self-pay

## 2020-08-16 ENCOUNTER — Other Ambulatory Visit: Payer: Self-pay

## 2020-08-16 DIAGNOSIS — E871 Hypo-osmolality and hyponatremia: Secondary | ICD-10-CM

## 2020-08-16 NOTE — Telephone Encounter (Signed)
Labs faxed to [facility]. - Requesting appointment with Dr. Carolin Sicks (pts nephrologist)

## 2020-08-20 ENCOUNTER — Other Ambulatory Visit: Payer: Self-pay

## 2020-08-20 ENCOUNTER — Ambulatory Visit (HOSPITAL_BASED_OUTPATIENT_CLINIC_OR_DEPARTMENT_OTHER)
Admission: RE | Admit: 2020-08-20 | Discharge: 2020-08-20 | Disposition: A | Discharge status: Home / Self Care | Payer: 59 | Source: Ambulatory Visit | Attending: Gastroenterology | Admitting: Gastroenterology

## 2020-08-20 DIAGNOSIS — K7581 Nonalcoholic steatohepatitis (NASH): Secondary | ICD-10-CM | POA: Insufficient documentation

## 2020-08-20 IMAGING — US US ABDOMEN LIMITED
1 series · 14 of 25 positions shown · non-contrast
Comparison: [DATE]

CLINICAL DATA: NASH, cirrhosis

EXAM:
ULTRASOUND ABDOMEN LIMITED RIGHT UPPER QUADRANT

[Series 1: us abdomen limited · 14 of 29 slices shown]
[im 1/29]
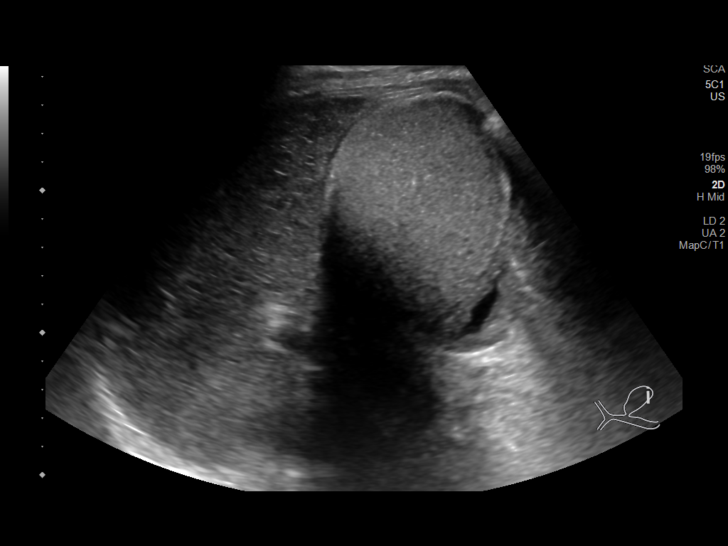
[im 3/29]
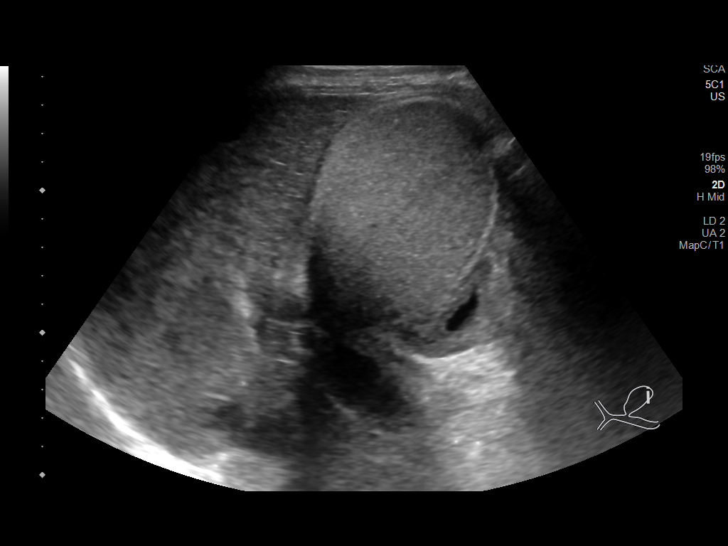
[im 5/29]
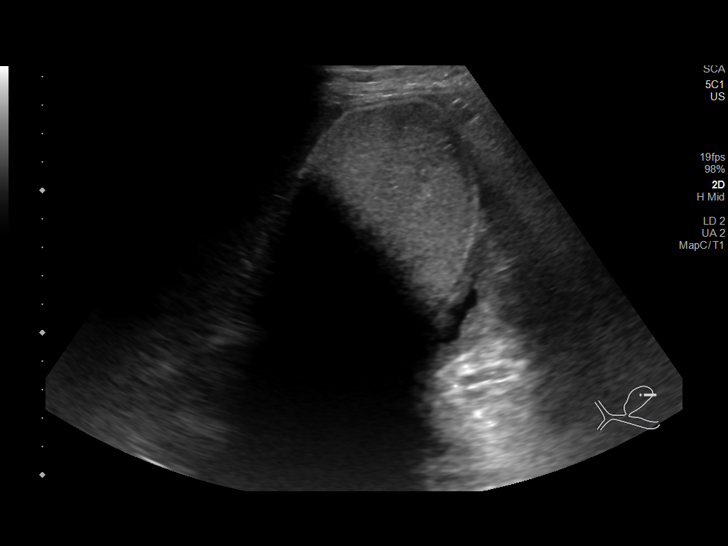
[im 8/29]
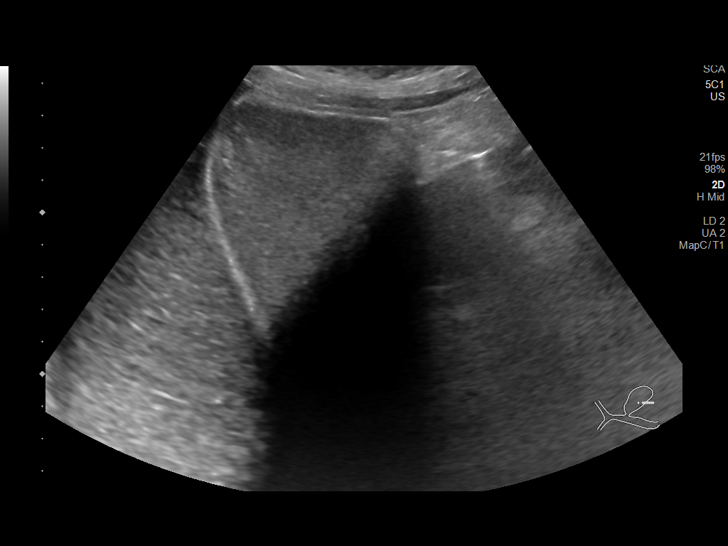
[im 10/29]
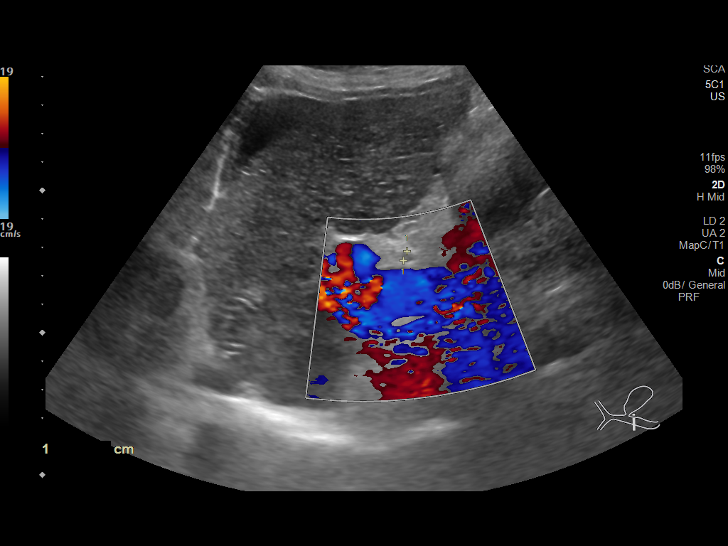
[im 11/29]
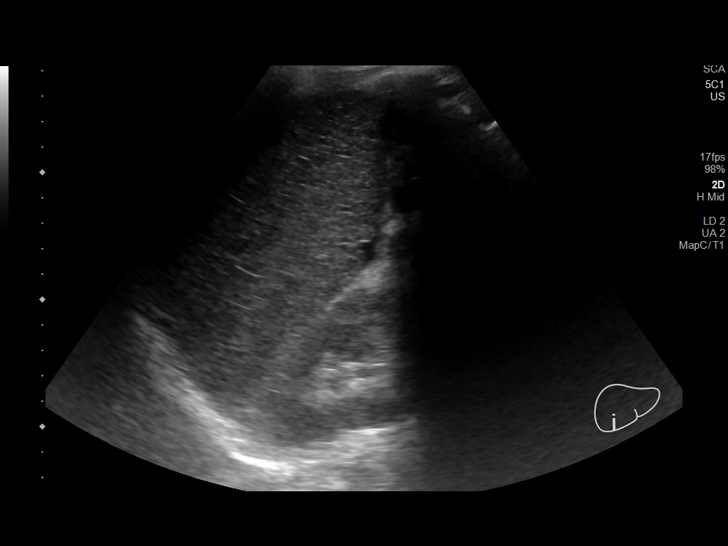
[im 13/29]
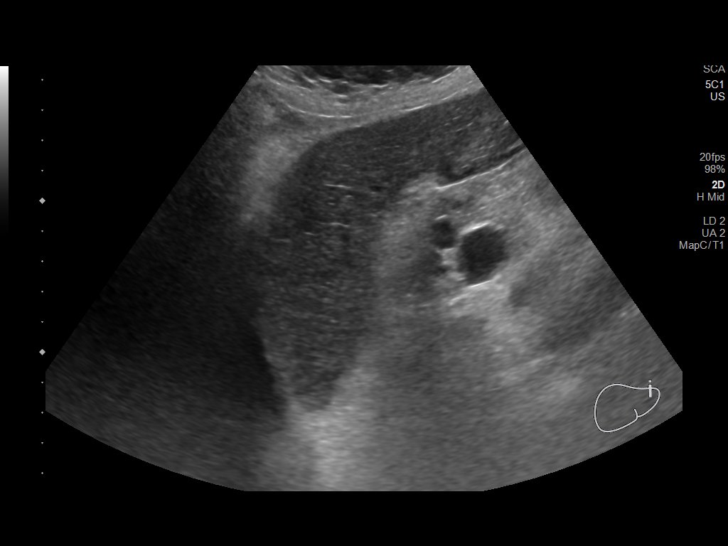
[im 16/29]
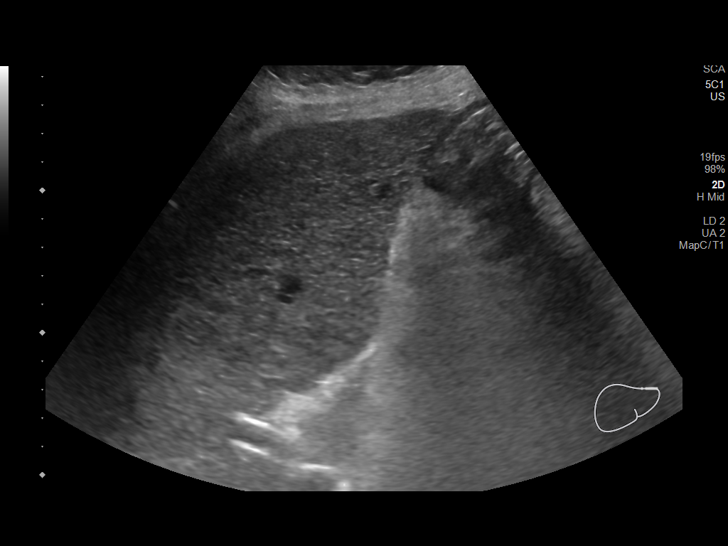
[im 18/29]
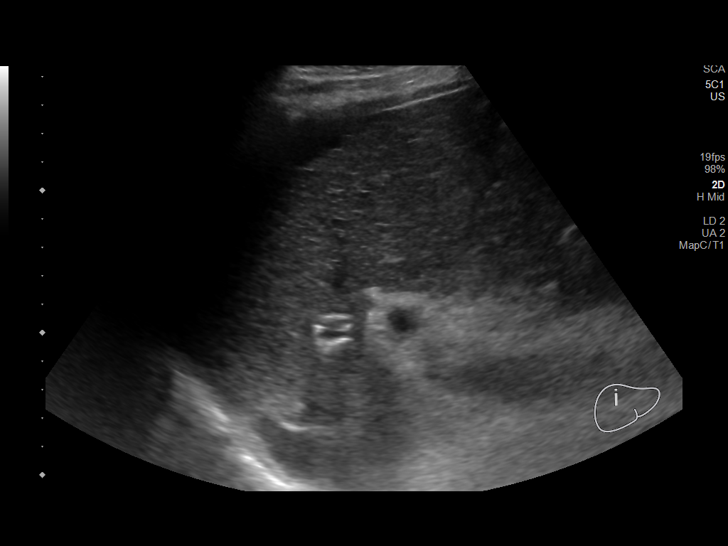
[im 19/29]
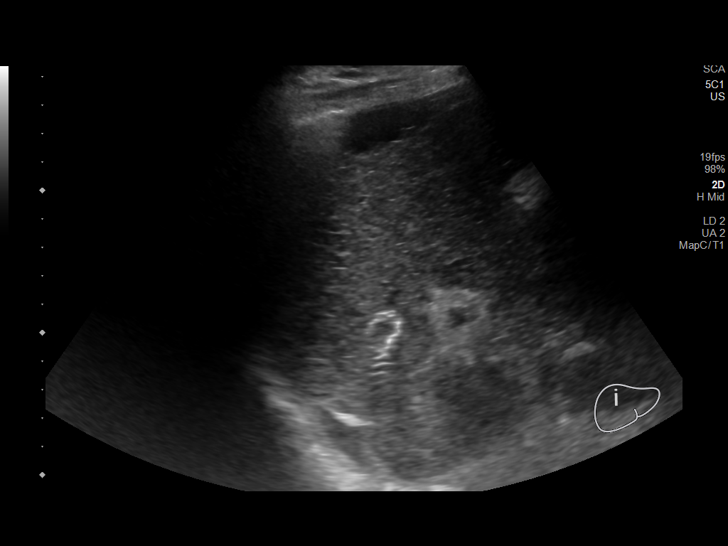
[im 22/29]
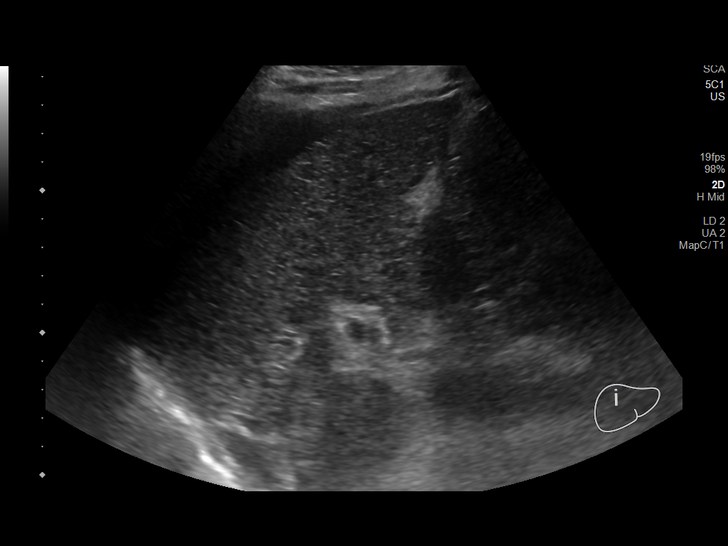
[im 24/29]
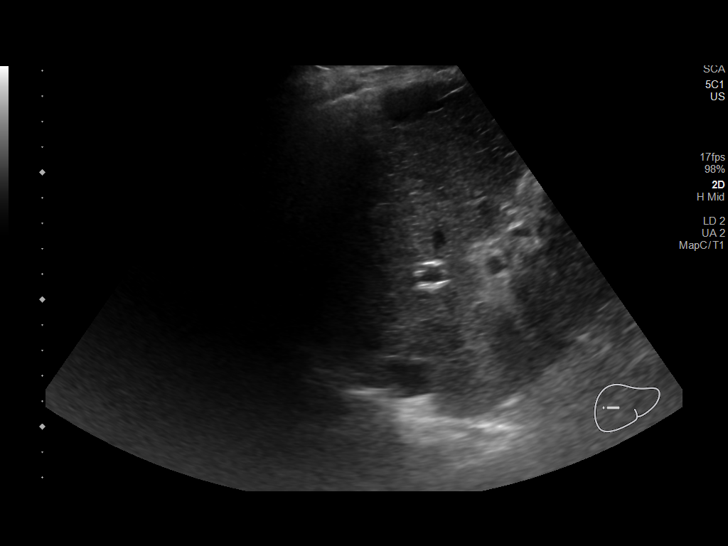
[im 26/29]
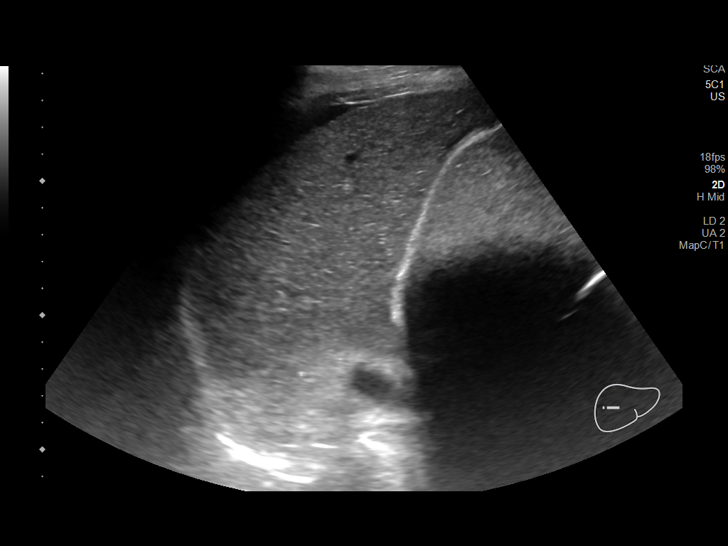
[im 29/29]
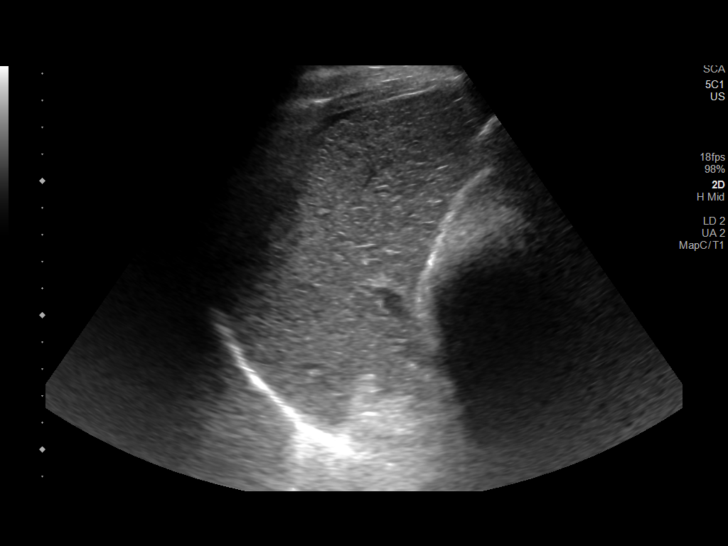

[14 of 25 positions shown; findings below may reference images not displayed]

FINDINGS: Gallbladder:

Echogenic material with shadowing likely reflecting combination of
sludge and small calculi. No sonographic Murphy sign noted by
sonographer.

Common bile duct:

Diameter: 3 mm, normal

Liver:

Heterogeneous and small. No focal lesion identified. Portal vein is
patent on color Doppler imaging with normal direction of blood flow
towards the liver. TIPS is noted.

Other: Small volume ascites.
IMPRESSION: Cirrhosis.  No focal liver lesion identified.

Small volume ascites.

Gallbladder sludge and probable small calculi.

## 2020-09-10 ENCOUNTER — Telehealth: Payer: Self-pay | Admitting: Gastroenterology

## 2020-09-10 NOTE — Telephone Encounter (Signed)
Received Disability forms from [employer] and going to give to [place] for Dr. Bryan Lemma to complete.  Patient has completed the intake info form, medical release form and paid the $29 fee for completing the forms.

## 2020-09-16 NOTE — Telephone Encounter (Signed)
Forms completed by Dr Bryan Lemma.  Given back to [place]. She will contact patient.

## 2020-09-23 ENCOUNTER — Telehealth: Payer: Self-pay | Admitting: Gastroenterology

## 2020-09-23 NOTE — Telephone Encounter (Signed)
Martin Strong, is this something you are working on?

## 2020-09-24 NOTE — Telephone Encounter (Signed)
Faxed completed [place] forms and mailed patient a copy of the forms.

## 2020-09-24 NOTE — Telephone Encounter (Signed)
A message has been left with patient that his FMLA paperwork has been complete and faxed to his employer. He was told that the original copies will be mailed to him. He was asked to call back with any questions or concerns.

## 2020-10-07 ENCOUNTER — Other Ambulatory Visit: Payer: Self-pay | Admitting: Gastroenterology

## 2020-10-09 ENCOUNTER — Telehealth: Payer: Self-pay | Admitting: Gastroenterology

## 2020-10-09 ENCOUNTER — Other Ambulatory Visit: Payer: Self-pay | Admitting: Gastroenterology

## 2020-10-09 MED ORDER — CIPROFLOXACIN HCL 500 MG PO TABS: ORAL_TABLET | ORAL | 3 refills | Status: DC

## 2020-10-09 NOTE — Telephone Encounter (Signed)
Prescription sent to pharmacy. They will contact patient when ready for pick up

## 2020-10-10 ENCOUNTER — Telehealth: Payer: Self-pay | Admitting: Gastroenterology

## 2020-10-10 NOTE — Telephone Encounter (Signed)
Steph from [employer] is requesting a call back from a nurse to clarify the directions on the pt's CIPRO 554m, directions state [id number]

## 2020-10-19 ENCOUNTER — Inpatient Hospital Stay (HOSPITAL_COMMUNITY)
Admission: EM | Admit: 2020-10-19 | Discharge: 2020-10-21 | DRG: 442 | Disposition: A | Discharge status: Home / Self Care | Payer: 59 | Attending: Student in an Organized Health Care Education/Training Program | Admitting: Student in an Organized Health Care Education/Training Program

## 2020-10-19 ENCOUNTER — Encounter (HOSPITAL_COMMUNITY): Payer: Self-pay

## 2020-10-19 ENCOUNTER — Emergency Department (HOSPITAL_COMMUNITY): Payer: 59

## 2020-10-19 DIAGNOSIS — K7200 Acute and subacute hepatic failure without coma: Secondary | ICD-10-CM | POA: Diagnosis not present

## 2020-10-19 DIAGNOSIS — Z781 Physical restraint status: Secondary | ICD-10-CM | POA: Diagnosis not present

## 2020-10-19 DIAGNOSIS — E785 Hyperlipidemia, unspecified: Secondary | ICD-10-CM | POA: Diagnosis present

## 2020-10-19 DIAGNOSIS — Z7984 Long term (current) use of oral hypoglycemic drugs: Secondary | ICD-10-CM | POA: Diagnosis not present

## 2020-10-19 DIAGNOSIS — K219 Gastro-esophageal reflux disease without esophagitis: Secondary | ICD-10-CM | POA: Diagnosis present

## 2020-10-19 DIAGNOSIS — D6959 Other secondary thrombocytopenia: Secondary | ICD-10-CM | POA: Diagnosis present

## 2020-10-19 DIAGNOSIS — Z833 Family history of diabetes mellitus: Secondary | ICD-10-CM | POA: Diagnosis not present

## 2020-10-19 DIAGNOSIS — E876 Hypokalemia: Secondary | ICD-10-CM | POA: Diagnosis not present

## 2020-10-19 DIAGNOSIS — K7581 Nonalcoholic steatohepatitis (NASH): Secondary | ICD-10-CM | POA: Diagnosis not present

## 2020-10-19 DIAGNOSIS — I10 Essential (primary) hypertension: Secondary | ICD-10-CM | POA: Diagnosis present

## 2020-10-19 DIAGNOSIS — R451 Restlessness and agitation: Secondary | ICD-10-CM | POA: Diagnosis present

## 2020-10-19 DIAGNOSIS — K7469 Other cirrhosis of liver: Secondary | ICD-10-CM | POA: Diagnosis not present

## 2020-10-19 DIAGNOSIS — Z888 Allergy status to other drugs, medicaments and biological substances status: Secondary | ICD-10-CM | POA: Diagnosis not present

## 2020-10-19 DIAGNOSIS — Z9114 Patient's other noncompliance with medication regimen: Secondary | ICD-10-CM

## 2020-10-19 DIAGNOSIS — Z20822 Contact with and (suspected) exposure to covid-19: Secondary | ICD-10-CM | POA: Diagnosis present

## 2020-10-19 DIAGNOSIS — E1165 Type 2 diabetes mellitus with hyperglycemia: Secondary | ICD-10-CM | POA: Diagnosis present

## 2020-10-19 DIAGNOSIS — Z79899 Other long term (current) drug therapy: Secondary | ICD-10-CM

## 2020-10-19 DIAGNOSIS — K3189 Other diseases of stomach and duodenum: Secondary | ICD-10-CM | POA: Diagnosis present

## 2020-10-19 DIAGNOSIS — K7460 Unspecified cirrhosis of liver: Secondary | ICD-10-CM | POA: Diagnosis present

## 2020-10-19 DIAGNOSIS — R4182 Altered mental status, unspecified: Secondary | ICD-10-CM | POA: Diagnosis present

## 2020-10-19 DIAGNOSIS — K766 Portal hypertension: Secondary | ICD-10-CM | POA: Diagnosis present

## 2020-10-19 DIAGNOSIS — K7290 Hepatic failure, unspecified without coma: Secondary | ICD-10-CM | POA: Diagnosis not present

## 2020-10-19 DIAGNOSIS — Z792 Long term (current) use of antibiotics: Secondary | ICD-10-CM

## 2020-10-19 DIAGNOSIS — D696 Thrombocytopenia, unspecified: Secondary | ICD-10-CM | POA: Diagnosis not present

## 2020-10-19 DIAGNOSIS — K7682 Hepatic encephalopathy: Secondary | ICD-10-CM

## 2020-10-19 DIAGNOSIS — E860 Dehydration: Secondary | ICD-10-CM | POA: Diagnosis not present

## 2020-10-19 LAB — COMPREHENSIVE METABOLIC PANEL
ALT: 34 U/L (ref 0–44)
AST: 37 U/L (ref 15–41)
Albumin: 3.7 g/dL (ref 3.5–5.0)
Alkaline Phosphatase: 81 U/L (ref 38–126)
Anion gap: 14 (ref 5–15)
BUN: 16 mg/dL (ref 6–20)
CO2: 20 mmol/L — ABNORMAL LOW (ref 22–32)
Calcium: 9.6 mg/dL (ref 8.9–10.3)
Chloride: 105 mmol/L (ref 98–111)
Creatinine, Ser: 0.96 mg/dL (ref 0.61–1.24)
GFR, Estimated: >60 mL/min (ref >60)
Glucose, Bld: 253 mg/dL — ABNORMAL HIGH (ref 70–99)
Potassium: 4.1 mmol/L (ref 3.5–5.1)
Sodium: 139 mmol/L (ref 135–145)
Total Bilirubin: 2 mg/dL — ABNORMAL HIGH (ref 0.3–1.2)
Total Protein: 7.4 g/dL (ref 6.5–8.1)

## 2020-10-19 LAB — CBC WITH DIFFERENTIAL/PLATELET
Abs Immature Granulocytes: 0.02 10*3/uL (ref 0.00–0.07)
Basophils Absolute: 0.1 10*3/uL (ref 0.0–0.1)
Basophils Relative: 1 %
Eosinophils Absolute: 0.3 10*3/uL (ref 0.0–0.5)
Eosinophils Relative: 4 %
HCT: 35.6 % — ABNORMAL LOW (ref 39.0–52.0)
Hemoglobin: 13.2 g/dL (ref 13.0–17.0)
Immature Granulocytes: 0 %
Lymphocytes Relative: 15 %
Lymphs Abs: 1 10*3/uL (ref 0.7–4.0)
MCH: 35.9 pg — ABNORMAL HIGH (ref 26.0–34.0)
MCHC: 37.1 g/dL — ABNORMAL HIGH (ref 30.0–36.0)
MCV: 96.7 fL (ref 80.0–100.0)
Monocytes Absolute: 0.5 10*3/uL (ref 0.1–1.0)
Monocytes Relative: 7 %
Neutro Abs: 4.7 10*3/uL (ref 1.7–7.7)
Neutrophils Relative %: 73 %
Platelets: 114 10*3/uL — ABNORMAL LOW (ref 150–400)
RBC: 3.68 MIL/uL — ABNORMAL LOW (ref 4.22–5.81)
RDW: 14.6 % (ref 11.5–15.5)
WBC: 6.5 10*3/uL (ref 4.0–10.5)
nRBC: 0 % (ref 0.0–0.2)

## 2020-10-19 LAB — LACTIC ACID, PLASMA: Lactic Acid, Venous: 3.6 mmol/L (ref 0.5–1.9)

## 2020-10-19 LAB — URINALYSIS, ROUTINE W REFLEX MICROSCOPIC
Bacteria, UA: NONE SEEN
Bilirubin Urine: NEGATIVE
Glucose, UA: >=500 mg/dL — AB
Hgb urine dipstick: NEGATIVE
Ketones, ur: 5 mg/dL — AB
Leukocytes,Ua: NEGATIVE
Nitrite: NEGATIVE
Protein, ur: NEGATIVE mg/dL
Specific Gravity, Urine: 1.033 — ABNORMAL HIGH (ref 1.005–1.030)
pH: 6 (ref 5.0–8.0)

## 2020-10-19 LAB — RESPIRATORY PANEL BY RT PCR (FLU A&B, COVID)
Influenza A by PCR: NEGATIVE
Influenza B by PCR: NEGATIVE
SARS Coronavirus 2 by RT PCR: NEGATIVE

## 2020-10-19 LAB — GLUCOSE, CAPILLARY
Glucose-Capillary: 180 mg/dL — ABNORMAL HIGH (ref 70–99)
Glucose-Capillary: 118 mg/dL — ABNORMAL HIGH (ref 70–99)

## 2020-10-19 LAB — LIPASE, BLOOD: Lipase: 60 U/L — ABNORMAL HIGH (ref 11–51)

## 2020-10-19 LAB — AMMONIA: Ammonia: 290 umol/L — ABNORMAL HIGH (ref 9–35)

## 2020-10-19 LAB — PROTIME-INR
INR: 1.3 — ABNORMAL HIGH (ref 0.8–1.2)
Prothrombin Time: 15.8 seconds — ABNORMAL HIGH (ref 11.4–15.2)

## 2020-10-19 MED ORDER — SODIUM CHLORIDE 0.9% FLUSH
3.0000 mL | Freq: Two times a day (BID) | INTRAVENOUS | Status: DC
  Administered 2020-10-19 – 2020-10-21 (×3): 3 mL via INTRAVENOUS

## 2020-10-19 MED ORDER — SODIUM CHLORIDE 0.9 % IV BOLUS
1000.0000 mL | Freq: Once | INTRAVENOUS | Status: AC
  Administered 2020-10-19: 1000 mL via INTRAVENOUS

## 2020-10-19 MED ORDER — LACTULOSE ENEMA
300.0000 mL | Freq: Once | ORAL | Status: AC
  Administered 2020-10-19: 300 mL via RECTAL
  Filled 2020-10-19: qty 300

## 2020-10-19 MED ORDER — HALOPERIDOL LACTATE 5 MG/ML IJ SOLN
2.0000 mg | Freq: Once | INTRAMUSCULAR | Status: AC
  Administered 2020-10-19: 2 mg via INTRAVENOUS
  Filled 2020-10-19: qty 1

## 2020-10-19 MED ORDER — INSULIN ASPART 100 UNIT/ML ~~LOC~~ SOLN
0.0000 [IU] | Freq: Three times a day (TID) | SUBCUTANEOUS | Status: DC
  Administered 2020-10-19: 3 [IU] via SUBCUTANEOUS

## 2020-10-19 MED ORDER — SODIUM CHLORIDE 0.9 % IV SOLN
2.0000 g | INTRAVENOUS | Status: DC
  Administered 2020-10-19: 2 g via INTRAVENOUS
  Filled 2020-10-19: qty 20

## 2020-10-19 MED ORDER — ENOXAPARIN SODIUM 40 MG/0.4ML ~~LOC~~ SOLN: 40.0000 mg | SUBCUTANEOUS | Status: DC

## 2020-10-19 MED ORDER — IBUPROFEN 400 MG PO TABS: 400.0000 mg | ORAL_TABLET | Freq: Four times a day (QID) | ORAL | Status: DC | PRN

## 2020-10-19 MED ORDER — ONDANSETRON HCL 4 MG/2ML IJ SOLN
4.0000 mg | Freq: Once | INTRAMUSCULAR | Status: AC
  Administered 2020-10-19: 4 mg via INTRAVENOUS
  Filled 2020-10-19: qty 2

## 2020-10-19 NOTE — H&P (Addendum)
Date: 10/19/2020               Patient Name:  Martin Strong MRN: [id number]  DOB: 04-15-1966 Age / Sex: 54 y.o., male   PCP: [name], NP         Medical Service: Internal Medicine Teaching Service         Attending Physician: Dr. Sid Falcon, MD    First Contact: Dr. Collene Gobble Pager: [phone]  Second Contact: Dr. 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 History obtained through telephone conversation with patient's wife, Martin Strong, as patient's current mental status impairs ability for discussion. Patient has long history of cirrhosis with multiple hospitalizations in the past. Martin Strong reports patient has not needed a paracentesis since TIPS revision 04/11/2020. Mentions he has endorsed right-sided abdominal pain and tremors over the last few months, but no acute change over the last few days. Yesterday patient reportedly experienced vomiting, diarrhea, and generalized weakness. Unknown if blood was present in emesis or stool. This morning, patient was very upset at Martin Strong. She states he kept yelling "no" at her and acting strange. She mentions he tried to get into the bathtub with his clothes on and attempted urination in the laundry room and outside of the house. She notes this behavior is very abnormal for him. She believes she has taken all of his medications, including lactulose and rifaximin, although she cannot say with certainty. Martin Strong denies patient has had recent fevers, falls, head trauma, or chest pain. She does mention he has had some chronic  balance issues and an episode of dyspnea a few nights ago. Given his abnormal behavior she called EMS for ED evaluation.  In the ED patient afebrile, HDS on RA. Patient somnolent but arousable, disoriented. Labs notable for lactic acidosis, hyperammonemia, mildly elevated Tbili.  Received bolus of NS, lactulose enema, and 75m Haldol in ED. IMTS consulted for admission for acute encephalopathy.  Meds:  Current Meds  Medication Sig  . cholecalciferol (VITAMIN D) 1000 units tablet Take 1,000 Units by mouth daily.   . ciprofloxacin (CIPRO) 500 MG tablet Take Cipro 750 mg weekly (Patient taking differently: Take 750 mg by mouth once a week. Take Cipro 750 mg weekly)  . dapagliflozin propanediol (FARXIGA) 10 MG TABS tablet Take 10 mg by mouth at bedtime.  . ferrous sulfate (KP FERROUS SULFATE) 325 (65 FE) MG tablet Take 1 tablet (325 mg total) by mouth daily with breakfast. (Patient taking differently: Take 325 mg by mouth daily. )  . furosemide (LASIX) 40 MG tablet Take 2 tablets (80 mg total) by mouth daily. (Patient taking differently: Take 20 mg by mouth daily. )  . glipiZIDE (GLUCOTROL XL) 10 MG 24 hr tablet Take 10 mg by mouth 2 (two) times daily.  .Marland Kitchenlactulose (CHRONULAC) 10 GM/15ML solution Take 30 mLs (20 g total) by mouth 3 (three) times daily.  .Marland KitchenlevOCARNitine (CARNITOR) 330 MG tablet Take 330 mg by mouth 3 (three) times daily.  . metFORMIN (GLUCOPHAGE) 1000 MG tablet Take 1,000 mg by mouth 2 (two) times daily with  a meal.  . Multiple Vitamin (MULTIVITAMIN WITH MINERALS) TABS tablet Take 1 tablet by mouth at bedtime.  . potassium chloride (KLOR-CON) 10 MEQ tablet Take 20 mEq by mouth daily.   . propranolol (INDERAL) 40 MG tablet Take 0.5 tablets (20 mg total) by mouth 2 (two) times daily. (Patient taking differently: Take 40 mg by mouth 2 (two) times daily. )  . spironolactone (ALDACTONE) 25 MG tablet Take 1 tablet (25 mg total) by mouth daily.  . Zinc 50 MG TABS Take 22 mg by mouth daily.      Allergies: Allergies as of 10/19/2020 - Review Complete 10/19/2020  Allergen Reaction Noted  . Nadolol Itching and Nausea Only 10/06/2018   Past Medical History:  Diagnosis Date  . Anemia   . Arthritis   . Cirrhosis ([place])   . Diabetes mellitus without complication ([place])   . Dyspnea   . Fatty liver   . GERD (gastroesophageal reflux disease)   . GI bleed   . Hyperlipidemia   . Hypertension    Family History:  Family History  Problem Relation Age of Onset  . Diabetes Other   . Esophageal cancer Father   . Colon cancer Neg Hx   . Rectal cancer Neg Hx   . Stomach cancer Neg Hx    Social History:  Patient lives at home with wife and two daughters. Currently on disability, previously was a Administrator. No history of smoking, alcohol, or drug use.  Review of Systems: A complete ROS was negative except as per HPI.   Physical Exam: Blood pressure (!) 119/57, pulse 70, temperature (!) 96.5 F (35.8 C), temperature source Rectal, resp. rate 13, height 6' 1"  (1.854 m), weight 86.2 kg, SpO2 99 %. Physical Exam Constitutional:      General: He is not in acute distress.    Appearance: He is not diaphoretic.  HENT:     Head: Normocephalic and atraumatic.  Eyes:     General: No scleral icterus. Cardiovascular:     Rate and Rhythm: Normal rate and regular rhythm.     Pulses: Normal pulses.     Heart sounds: Normal heart sounds. No murmur heard.   Pulmonary:     Effort: Pulmonary effort is normal.     Breath sounds: Normal breath sounds. No wheezing, rhonchi or rales.  Abdominal:     General: Bowel sounds are normal. There is no distension.     Palpations: Abdomen is soft.     Tenderness: There is no abdominal tenderness.  Musculoskeletal:     Right lower leg: No edema.     Left lower leg: No edema.  Skin:    General: Skin is warm and dry.     Coloration: Skin is not jaundiced.  Neurological:     Mental Status: He is lethargic and disoriented.     Comments: Unable to  assess full neurological exam given mental status.  Psychiatric:        Behavior: Behavior is uncooperative and agitated.    EKG: personally reviewed my interpretation is normal sinus rhythm.  Assessment & Plan by Problem: Mr. Venditto is 54yo male with NASH cirrhosis s/p TIPS and subsequent revision (03/2020), previous variceal bleeds with banding admitted 11/6 for acute encephalopathy, most concerning for hepatic etiology.  Active Problems:   Altered mental status  #Grade II Hepatic Encephalopathy #Lactic acidosis Patient presenting with acute encephalopathy that started this morning. Recently has had right-sided abdominal pain, no fevers, falls, or head trauma. He has  multiple hospitalizations for hepatic encephalopathy, most recently in June. Wife is not certain patient has taken his daily lactulose, rifaximin, and cipro (SBP prophylaxis). On arrival, patient somnolent, but when aroused becomes combative. Afebrile, HDS, sating well on room air. Abdomen is soft, non-tender, no signs of ascites. Lactic acidosis and hyperammonemia present, no leukocytosis. Tbili 2.0, LFT's normal. Renal function normal. Obtaining CT head to rule-out brain injury. No obvious signs of stroke, although difficult to obtain neurological exam due to patient's combativeness. Most likely hepatic encephalopathy, but high risk for septic encephalopathy. Cannot rule-out occlusion of TIPS, but lower likelihood given only mildly elevated Tbili. Patient received lactulose enema in ED, will continue lactulose and rifaximin. Obtaining blood cultures and continuing SBP prophylaxis. Will give fluids and trend lactic. 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 MELD pending INR. Patient has extensive history of complications due to cirrhosis, including variceal bleeds and  hepatic encephalopathy. TIPS procedure completed in March with revision in April due to occlusion of stent. Per wife patient required paracentesis every two weeks prior to revision. Ultrasound 05/09/20 showed patient TIPS with high velocities. Patient is followed by liver transplant team at [facility], has been deemed not a transplant candidate at this time. On arrival patient does not appear volume overloaded, abdomen is not distended and non-tender. Will trend CBC for PLT. Given previous bleeding incidents and low platelets will avoid chemical VTE prophylaxis. Low threshold for GI consult if concern arises for bleeding, TIPS occlusion. Given acute encephalopathy will hold home meds until improved mental status. - Hold home aldactone, levocarnitime, furosemide  #Type 2 Diabetes #Hyperglycemia Last A1c 7.9 in September. Home medications include Farxiga 10, metformin 1g bid, glipizide 10 bid. On arrival glucose 253. Glucosuria, ketonuria present. Unclear on recent po intake, most likely starvation ketosis. Holding home medications, will start SSI. Patient currently NPO given mental status, will re-evaluate regimen once mental status improves. - Hold home meds - SSI - CBG monitoring q4  DIET: NPO IVF: n/a DVT PPX: SCDs BOWEL: Lactulose CODE: FULL  Dispo: Admit patient to Inpatient with expected length of stay greater than 2 midnights.  Signed: Sanjuan Dame, MD 10/19/2020, 11:39 AM  Pager: [phone] After 5pm on weekdays and 1pm on weekends: On Call pager: [phone]

## 2020-10-19 NOTE — ED Notes (Signed)
Pt had large semi-formed BM

## 2020-10-19 NOTE — ED Notes (Signed)
Pt being verbally abusive, pt trying to get OOB and bite [place]. Pt not following verbal redirection. We have to physically restrain pt to keep him in bed.

## 2020-10-19 NOTE — ED Notes (Signed)
Unable to obtain blood cultures before atx, because pt combative and won't stay still enough to get blood from.

## 2020-10-19 NOTE — ED Notes (Signed)
Applied warm blankets to pt. Pt will not tolerate bair hugger.

## 2020-10-19 NOTE — ED Triage Notes (Signed)
Pt arrived via [facility] from home for AMS. Per EMS pt had N/V/D yesterday. Pt woke up today with general malaise, AMS. Pt has a pump to help avoid elevated ammonia level and pump may be clogged per family, because that happened in May. Pt is alert, but confused. Pt is sinus brady on monitor. VSS.

## 2020-10-19 NOTE — ED Notes (Signed)
Patient transported to CT 

## 2020-10-19 NOTE — Progress Notes (Signed)
NEW ADMISSION NOTE New Admission Note:   Arrival Method: Bed Mental Orientation:  Patient lethargic wanting to sleep A&O to self and place Telemetry: Box 8 Assessment: Patient very lethargic, no able to get accurate response Skin: Intact IV: left wrist Pain: denies Tubes: none Safety Measures: patient lethargic and wanting to sleep  [address] [place] Orientation: patient lethargic and wanting to sleep Family: not present  Orders have been reviewed and implemented. Will continue to monitor the patient. Call light has been placed within reach and bed alarm has been activated.   Berneta Levins, RN

## 2020-10-19 NOTE — ED Provider Notes (Addendum)
[facility] EMERGENCY DEPARTMENT Provider Note   CSN: [id number] Arrival date & time: 10/19/20  5465     History Chief Complaint  Patient presents with  . Altered Mental Status    Martin Strong is a 54 y.o. male.  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  Spoke with patient's wife Katharine Look who gives most of the history.  She reports that yesterday he complained of pain in his abdomen and side and was having vomiting and diarrhea.  When he woke up this morning he was not acting himself.  She reports he was all over the place.  He was in the laundry room and then in another room and was very agitated and cursing at her.  She reports the last time he did this his ammonia was too high.  She has not noticed that he had a fever or cough.  He has not appeared to have any trouble breathing.  She reports that he has been complaining of worsening pain in his abdomen for the last 1 to 2 months but he reported that he was just going to follow-up with his doctor in December about this.  Thursday seemed like a normal day for him except that he was tired.  He does continue to take Cipro for SBP prevention.  She denies him having any new swelling in his abdomen or legs.  As far as she knows he has been taking his medications as prescribed.  Patient is not able to give any history today except that he reported he feels nauseated.  Patient spouse did not visualize his vomit yesterday and she is not sure if he had any bloody emesis.  The history is provided by the spouse, the EMS personnel and medical records. The history is limited by the condition of the patient.  Altered Mental Status       Past Medical History:  Diagnosis Date  . Anemia   . Arthritis   . Cirrhosis ([place])   . Diabetes mellitus without complication ([place])   . Dyspnea   . Fatty liver   . GERD (gastroesophageal reflux disease)   . GI bleed   . Hyperlipidemia   . Hypertension     Patient Active Problem List   Diagnosis Date Noted  . Abdominal distention   . Encephalopathy acute   . 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 Liver cirrhosis secondary to NASH ([place]) 03/12/2020  . SBP (spontaneous bacterial peritonitis) ([place]) 01/24/2020  . 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 Dysphagia 07/21/2019  . GERD (gastroesophageal reflux disease) 07/21/2019  . Gastritis and gastroduodenitis   . Varices of esophagus determined by endoscopy ([place])   . Portal hypertensive gastropathy ([place])   . Acute upper GI bleed 10/06/2018  . Cirrhosis ([place]) 10/06/2018  . Hypertension 10/06/2018  . Diabetes mellitus without complication ([place]) 03/54/6568    Past Surgical History:  Procedure Laterality Date  . BIOPSY  02/14/2019   Procedure: BIOPSY;  Surgeon: Lavena Bullion, DO;  Location: [facility] ENDOSCOPY;  Service: Gastroenterology;;  . BIOPSY  07/22/2019   Procedure: BIOPSY;  Surgeon: Lavena Bullion, DO;  Location: [facility];  Service: Gastroenterology;;  . ESOPHAGEAL BANDING  10/07/2018   Procedure: ESOPHAGEAL BANDING;  Surgeon: Lavena Bullion, DO;  Location: [facility] ENDOSCOPY;  Service: Gastroenterology;;  . ESOPHAGEAL BANDING N/A 01/03/2019   Procedure: ESOPHAGEAL BANDING;  Surgeon: Lavena Bullion, DO;  Location: [facility] ENDOSCOPY;  Service: Gastroenterology;  Laterality: N/A;  . ESOPHAGEAL BANDING  02/14/2019   Procedure: ESOPHAGEAL BANDING;  Surgeon: Lavena Bullion, DO;  Location: [facility];  Service: Gastroenterology;;  . esophageal bands    . ESOPHAGOGASTRODUODENOSCOPY Left 07/22/2019   Procedure: ESOPHAGOGASTRODUODENOSCOPY (EGD) WITH POSSIBLE BANDING;  Surgeon: Lavena Bullion, DO;  Location: [facility]  ENDOSCOPY;  Service: Gastroenterology;  Laterality: Left;  . ESOPHAGOGASTRODUODENOSCOPY (EGD) WITH PROPOFOL N/A 10/07/2018   Procedure: ESOPHAGOGASTRODUODENOSCOPY (EGD) WITH PROPOFOL;  Surgeon: Lavena Bullion, DO;  Location: [facility];  Service: Gastroenterology;  Laterality: N/A;  . ESOPHAGOGASTRODUODENOSCOPY (EGD) WITH PROPOFOL N/A 01/03/2019   Procedure: ESOPHAGOGASTRODUODENOSCOPY (EGD) WITH PROPOFOL;  Surgeon: Lavena Bullion, DO;  Location: [facility];  Service: Gastroenterology;  Laterality: N/A;  . ESOPHAGOGASTRODUODENOSCOPY (EGD) WITH PROPOFOL N/A 02/14/2019   Procedure: ESOPHAGOGASTRODUODENOSCOPY (EGD) WITH PROPOFOL;  Surgeon: Lavena Bullion, DO;  Location: [facility];  Service: Gastroenterology;  Laterality: N/A;  . IR PARACENTESIS  10/05/2019  . IR PARACENTESIS  10/24/2019  . IR PARACENTESIS  11/16/2019  . IR PARACENTESIS  12/25/2019  . IR PARACENTESIS  01/05/2020  . IR PARACENTESIS  01/23/2020  . IR PARACENTESIS  02/08/2020  . IR PARACENTESIS  02/16/2020  . IR PARACENTESIS  02/28/2020  . IR PARACENTESIS  03/12/2020  . IR PARACENTESIS  04/04/2020  . IR PARACENTESIS  04/11/2020  . IR PARACENTESIS  04/24/2020  . IR PARACENTESIS  05/03/2020  . IR PARACENTESIS  05/14/2020  . IR RADIOLOGIST EVAL & MGMT  12/19/2019  . IR RADIOLOGIST EVAL & MGMT  02/27/2020  . IR RADIOLOGIST EVAL & MGMT  04/09/2020  . IR RADIOLOGIST EVAL & MGMT  05/09/2020  . IR TIPS  03/12/2020  . IR TIPS REVISION MOD SED  04/11/2020  . RADIOLOGY WITH ANESTHESIA N/A 03/12/2020   Procedure: TIPS;  Surgeon: Jacqulynn Cadet, MD;  Location: [facility];  Service: Radiology;  Laterality: N/A;       Family History  Problem Relation Age of Onset  . Diabetes Other   . Esophageal cancer Father   . Colon cancer Neg Hx   . Rectal cancer Neg Hx   . Stomach cancer Neg Hx     Social History   Tobacco Use  . Smoking status: Never Smoker  . Smokeless tobacco: Never Used  Vaping Use  . Vaping Use: Never used  Substance Use  Topics  . Alcohol use: Not Currently  . Drug use: Never    Home Medications Prior to Admission medications   Medication Sig Start Date End Date Taking? Authorizing Provider  cholecalciferol (VITAMIN D) 1000 units tablet Take 1,000 Units by mouth daily.     [provider]  ciprofloxacin (CIPRO) 500 MG tablet Take Cipro 750 mg weekly 10/09/20   Cirigliano, Vito V, DO  dapagliflozin propanediol (FARXIGA) 10 MG TABS tablet Take 10 mg by mouth at bedtime.    [provider]  feeding supplement, ENSURE ENLIVE, (ENSURE ENLIVE) LIQD Take 237 mLs by mouth 2 (two) times daily between meals. 01/28/20   Hosie Poisson, MD  ferrous sulfate (KP FERROUS SULFATE) 325 (65 FE) MG tablet Take 1 tablet (325 mg total) by mouth daily with breakfast. Patient taking differently: Take 325 mg by mouth daily.  05/30/19   Cirigliano, Vito V, DO  furosemide (LASIX) 40 MG tablet  Take 2 tablets (80 mg total) by mouth daily. 05/16/20   Ghimire, Henreitta Leber, MD  glipiZIDE (GLUCOTROL XL) 10 MG 24 hr tablet Take 10 mg by mouth 2 (two) times daily.    [provider]  lactulose (CHRONULAC) 10 GM/15ML solution Take 30 mLs (20 g total) by mouth 3 (three) times daily. 03/13/20   Ardis Rowan, PA-C  levOCARNitine (CARNITOR) 330 MG tablet Take 330 mg by mouth 3 (three) times daily.    [provider]  metFORMIN (GLUCOPHAGE) 1000 MG tablet Take 1,000 mg by mouth 2 (two) times daily with a meal.    [provider]  Multiple Vitamin (MULTIVITAMIN WITH MINERALS) TABS tablet Take 1 tablet by mouth at bedtime.    [provider]  pantoprazole (PROTONIX) 40 MG tablet Take 40 mg by mouth daily.    [provider]  potassium chloride (KLOR-CON) 10 MEQ tablet Take 20 mEq by mouth daily.     [provider]  propranolol (INDERAL) 40 MG tablet Take 0.5 tablets (20 mg total) by mouth 2 (two) times daily. Patient taking differently: Take 40 mg by mouth 2 (two) times daily.   01/27/20   Hosie Poisson, MD  spironolactone (ALDACTONE) 25 MG tablet Take 1 tablet (25 mg total) by mouth daily. 08/14/20   Cirigliano, Vito V, DO  spironolactone (ALDACTONE) 50 MG tablet Take 1 tablet (50 mg total) by mouth daily. 05/16/20   Ghimire, Henreitta Leber, MD  Zinc 50 MG TABS Take 50 mg by mouth daily.    [provider]    Allergies    Nadolol  Review of Systems   Review of Systems  Unable to perform ROS: Mental status change    Physical Exam Updated Vital Signs BP 135/71   Pulse 61   Resp 14   Ht 6' 1"  (1.854 m)   Wt 86.2 kg   SpO2 100%   BMI 25.07 kg/m   Physical Exam Vitals and nursing note reviewed.  Constitutional:      General: He is awake. He is not in acute distress.    Appearance: He is well-developed. He is ill-appearing.  HENT:     Head: Normocephalic and atraumatic.  Eyes:     Conjunctiva/sclera: Conjunctivae normal.     Pupils: Pupils are equal, round, and reactive to light.  Cardiovascular:     Rate and Rhythm: Normal rate and regular rhythm.     Heart sounds: No murmur heard.   Pulmonary:     Effort: Pulmonary effort is normal. No respiratory distress.     Breath sounds: Normal breath sounds. No wheezing or rales.  Abdominal:     General: There is no distension.     Palpations: Abdomen is soft.     Tenderness: There is no abdominal tenderness. There is no guarding or rebound.     Comments: No reproducible abdominal tenderness at this time  Musculoskeletal:        General: No tenderness. Normal range of motion.     Cervical back: Normal range of motion and neck supple.  Skin:    General: Skin is warm and dry.     Findings: No erythema or rash.  Neurological:     Mental Status: He is lethargic.     Comments: Able to move all extremities however will not participate with exam.  Unable to evaluate for asterixis  Psychiatric:     Comments: Agitated any time patient is stimulated     ED Results /  Procedures / Treatments   Labs (all  labs ordered are listed, but only abnormal results are displayed) Labs Reviewed  CBC WITH DIFFERENTIAL/PLATELET - Abnormal; Notable for the following components:      Result Value   RBC 3.68 (*)    HCT 35.6 (*)    MCH 35.9 (*)    MCHC 37.1 (*)    Platelets 114 (*)    All other components within normal limits  COMPREHENSIVE METABOLIC PANEL - Abnormal; Notable for the following components:   CO2 20 (*)    Glucose, Bld 253 (*)    Total Bilirubin 2.0 (*)    All other components within normal limits  LIPASE, BLOOD - Abnormal; Notable for the following components:   Lipase 60 (*)    All other components within normal limits  LACTIC ACID, PLASMA - Abnormal; Notable for the following components:   Lactic Acid, Venous 3.6 (*)    All other components within normal limits  AMMONIA - Abnormal; Notable for the following components:   Ammonia 290 (*)    All other components within normal limits  RESPIRATORY PANEL BY RT PCR (FLU A&B, COVID)  URINALYSIS, ROUTINE W REFLEX MICROSCOPIC    EKG EKG Interpretation  Date/Time:  Saturday October 19 2020 09:32:34 EDT Ventricular Rate:  71 PR Interval:    QRS Duration: 100 QT Interval:  412 QTC Calculation: 448 R Axis:   36 Text Interpretation: Sinus rhythm No significant change since last tracing Confirmed by Blanchie Dessert [phone]) on 10/19/2020 9:44:58 AM   Radiology No results found.  Procedures Procedures (including critical care time)  Medications Ordered in ED Medications - No data to display  ED Course  I have reviewed the triage vital signs and the nursing notes.  Pertinent labs & imaging results that were available during my care of the patient were reviewed by me and considered in my medical decision making (see chart for details).    MDM Rules/Calculators/A&P                          Patient presenting today with a history of significant liver disease on multiple medications for symptom control as well as chronic Cipro  for SBP prevention who is presenting today with altered mental status.  Wife reports he did have abdominal pain vomiting and diarrhea yesterday.  Patient is not able to give any history at this time.  Unclear if he had any hematemesis yesterday.  Vital signs are within normal limits but patient is lethargic.  He will only occasionally answer questions and is agitated if and slightly combative.  Patient has no significant ascites and no abdominal pain with palpation currently.  He is satting 100% on room air but does indicate that he is nauseated.  Wife reports he has been compliant with his medications.  No evidence of fluid overload at this time.  Concern for hepatic encephalopathy given altered mental status versus stroke or head bleed.  Also concern of possible anemia if patient is having new variceal bleeding.  Labs and imaging are pending.  EKG is unchanged.  11:10 AM Patient is currently lethargic and obtunded.  This is most likely from an ammonia of 290.  Lactate is elevated at 3.6 and lipase minimally elevated at 60, CBC with stable hemoglobin of 13 and CMP with normal renal function.  Patient given a fluid bolus.  We will do lactulose enema.  Patient will need admission.  Vital signs remained stable.  Covid is negative. 3:30 PM Due to patient being agitated, trying to bite, hit, spit and kick he needed to be placed in soft restraints to allow him to get the lactulose enema to improve his ammonia and make him better.  Patient then was removed from restraints within 2 hours once he had a bowel movement and started acting more appropriately.  MDM Number of Diagnoses or Management Options   Amount and/or Complexity of Data Reviewed Clinical lab tests: ordered and reviewed Tests in the medicine section of CPT: ordered and reviewed Decide to obtain previous medical records or to obtain history from someone other than the patient: yes Obtain history from someone other than the patient: yes Review  and summarize past medical records: yes Discuss the patient with other providers: yes Independent visualization of images, tracings, or specimens: yes  Risk of Complications, Morbidity, and/or Mortality Presenting problems: high Diagnostic procedures: moderate Management options: moderate  Patient Progress Patient progress: stable  CRITICAL CARE Performed by: Criselda Starke Total critical care time: 30 minutes Critical care time was exclusive of separately billable procedures and treating other patients. Critical care was necessary to treat or prevent imminent or life-threatening deterioration. Critical care was time spent personally by me on the following activities: development of treatment plan with patient and/or surrogate as well as nursing, discussions with consultants, evaluation of patient's response to treatment, examination of patient, obtaining history from patient or surrogate, ordering and performing treatments and interventions, ordering and review of laboratory studies, ordering and review of radiographic studies, pulse oximetry and re-evaluation of patient's condition.    Final Clinical Impression(s) / ED Diagnoses Final diagnoses:  Altered mental status, unspecified altered mental status type  Hepatic encephalopathy ([place])  Dehydration    Rx / DC Orders ED Discharge Orders    None       Blanchie Dessert, MD 10/19/20 1112    Blanchie Dessert, MD 10/19/20 1531

## 2020-10-19 NOTE — ED Notes (Signed)
Cleaned pt and applied clean bedding and clean brief

## 2020-10-20 ENCOUNTER — Inpatient Hospital Stay (HOSPITAL_COMMUNITY): Payer: 59

## 2020-10-20 DIAGNOSIS — K7581 Nonalcoholic steatohepatitis (NASH): Secondary | ICD-10-CM

## 2020-10-20 DIAGNOSIS — R4182 Altered mental status, unspecified: Secondary | ICD-10-CM

## 2020-10-20 DIAGNOSIS — D696 Thrombocytopenia, unspecified: Secondary | ICD-10-CM

## 2020-10-20 DIAGNOSIS — E876 Hypokalemia: Secondary | ICD-10-CM

## 2020-10-20 DIAGNOSIS — E1165 Type 2 diabetes mellitus with hyperglycemia: Secondary | ICD-10-CM

## 2020-10-20 DIAGNOSIS — E860 Dehydration: Secondary | ICD-10-CM

## 2020-10-20 DIAGNOSIS — K7290 Hepatic failure, unspecified without coma: Secondary | ICD-10-CM

## 2020-10-20 DIAGNOSIS — K7469 Other cirrhosis of liver: Secondary | ICD-10-CM

## 2020-10-20 LAB — COMPREHENSIVE METABOLIC PANEL
ALT: 31 U/L (ref 0–44)
AST: 34 U/L (ref 15–41)
Albumin: 3.1 g/dL — ABNORMAL LOW (ref 3.5–5.0)
Alkaline Phosphatase: 68 U/L (ref 38–126)
Anion gap: 9 (ref 5–15)
BUN: 16 mg/dL (ref 6–20)
CO2: 21 mmol/L — ABNORMAL LOW (ref 22–32)
Calcium: 8.8 mg/dL — ABNORMAL LOW (ref 8.9–10.3)
Chloride: 114 mmol/L — ABNORMAL HIGH (ref 98–111)
Creatinine, Ser: 0.79 mg/dL (ref 0.61–1.24)
GFR, Estimated: >60 mL/min (ref >60)
Glucose, Bld: 84 mg/dL (ref 70–99)
Potassium: 3.3 mmol/L — ABNORMAL LOW (ref 3.5–5.1)
Sodium: 144 mmol/L (ref 135–145)
Total Bilirubin: 2 mg/dL — ABNORMAL HIGH (ref 0.3–1.2)
Total Protein: 6.2 g/dL — ABNORMAL LOW (ref 6.5–8.1)

## 2020-10-20 LAB — PROTIME-INR
INR: 1.4 — ABNORMAL HIGH (ref 0.8–1.2)
Prothrombin Time: 16.2 seconds — ABNORMAL HIGH (ref 11.4–15.2)

## 2020-10-20 LAB — CBC
HCT: 31.2 % — ABNORMAL LOW (ref 39.0–52.0)
Hemoglobin: 11.1 g/dL — ABNORMAL LOW (ref 13.0–17.0)
MCH: 34.5 pg — ABNORMAL HIGH (ref 26.0–34.0)
MCHC: 35.6 g/dL (ref 30.0–36.0)
MCV: 96.9 fL (ref 80.0–100.0)
Platelets: 73 10*3/uL — ABNORMAL LOW (ref 150–400)
RBC: 3.22 MIL/uL — ABNORMAL LOW (ref 4.22–5.81)
RDW: 14.5 % (ref 11.5–15.5)
WBC: 5.2 10*3/uL (ref 4.0–10.5)
nRBC: 0 % (ref 0.0–0.2)

## 2020-10-20 LAB — LACTIC ACID, PLASMA: Lactic Acid, Venous: 1.3 mmol/L (ref 0.5–1.9)

## 2020-10-20 LAB — BASIC METABOLIC PANEL
Anion gap: 9 (ref 5–15)
BUN: 16 mg/dL (ref 6–20)
CO2: 22 mmol/L (ref 22–32)
Calcium: 8.6 mg/dL — ABNORMAL LOW (ref 8.9–10.3)
Chloride: 106 mmol/L (ref 98–111)
Creatinine, Ser: 0.9 mg/dL (ref 0.61–1.24)
GFR, Estimated: >60 mL/min (ref >60)
Glucose, Bld: 253 mg/dL — ABNORMAL HIGH (ref 70–99)
Potassium: 4.2 mmol/L (ref 3.5–5.1)
Sodium: 137 mmol/L (ref 135–145)

## 2020-10-20 LAB — GLUCOSE, CAPILLARY
Glucose-Capillary: 124 mg/dL — ABNORMAL HIGH (ref 70–99)
Glucose-Capillary: 224 mg/dL — ABNORMAL HIGH (ref 70–99)
Glucose-Capillary: 76 mg/dL (ref 70–99)
Glucose-Capillary: 79 mg/dL (ref 70–99)
Glucose-Capillary: 95 mg/dL (ref 70–99)

## 2020-10-20 LAB — APTT: aPTT: 32 seconds (ref 24–36)

## 2020-10-20 IMAGING — CT CT HEAD W/O CM
3 series · 15 of 47 positions shown, 18 images · non-contrast
Comparison: No pertinent prior exams are available for comparison.

CLINICAL DATA: Mental status change, unknown cause.

EXAM:
CT HEAD WITHOUT CONTRAST
TECHNIQUE: Contiguous axial images were obtained from the base of the skull
through the vertex without intravenous contrast.

[Series 3: head 5.0 h30s · axial · 0.49mm/px · z∈[+1366,+1521]mm · 9 of 37 slices shown, 12 images]
[im 3/37  brain]
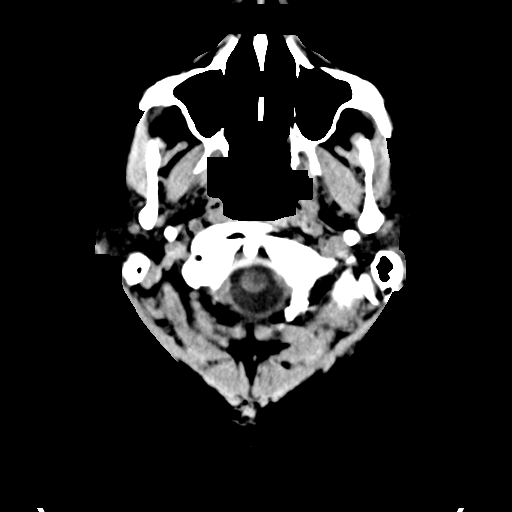
[im 3/37  bone]
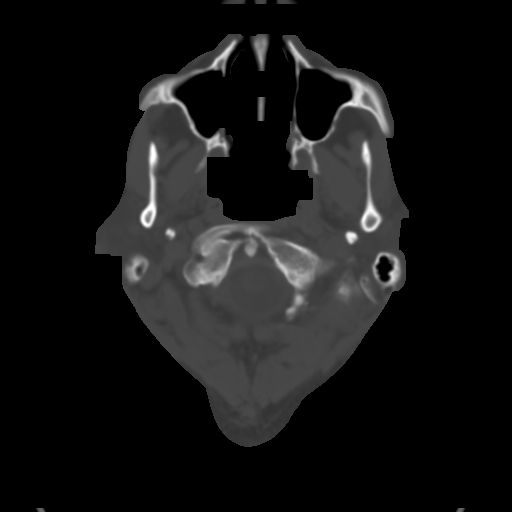
[im 7/37  brain]
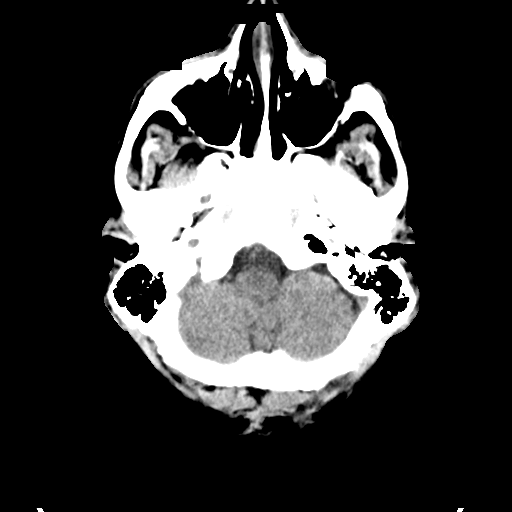
[im 10/37  brain]
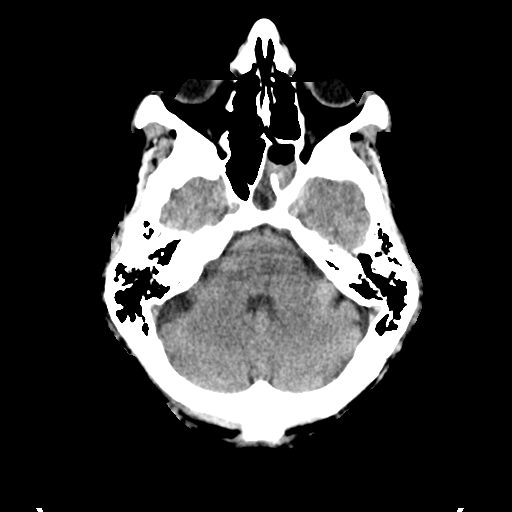
[im 14/37  brain]
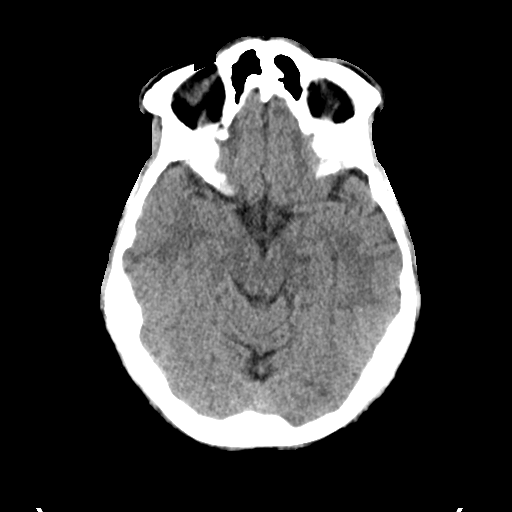
[im 19/37  brain]
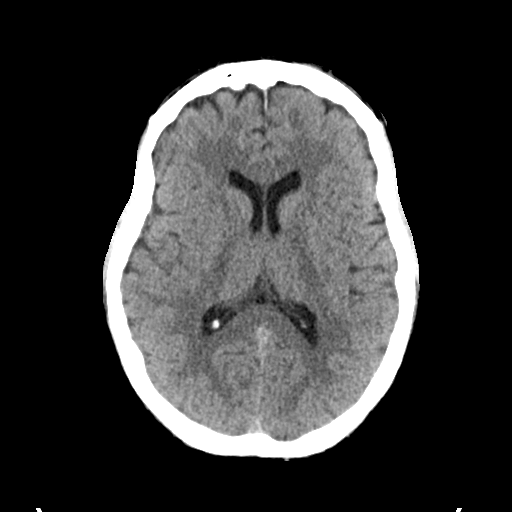
[im 19/37  bone]
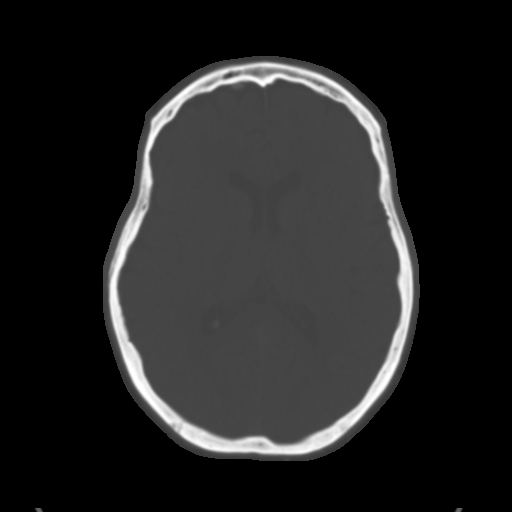
[im 23/37  brain]
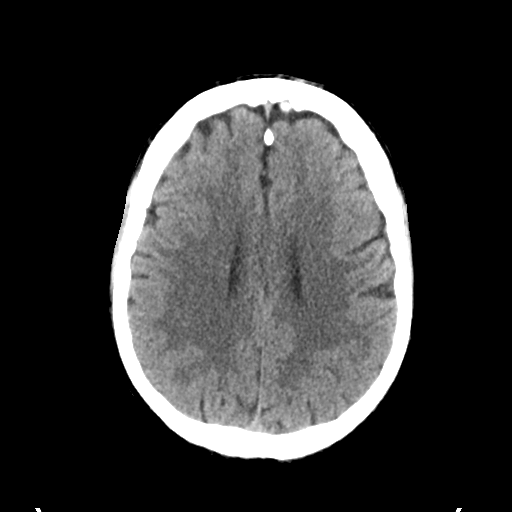
[im 27/37  brain]
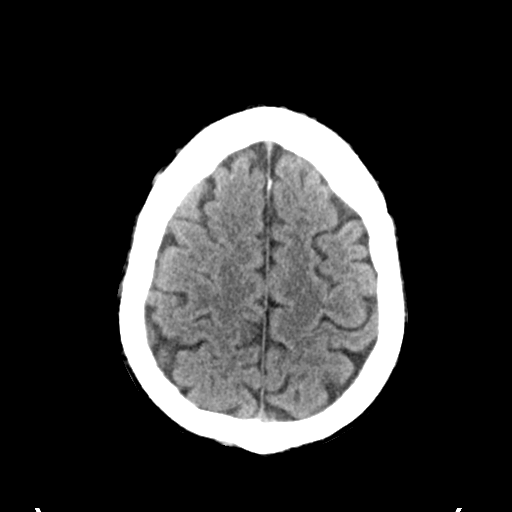
[im 30/37  brain]
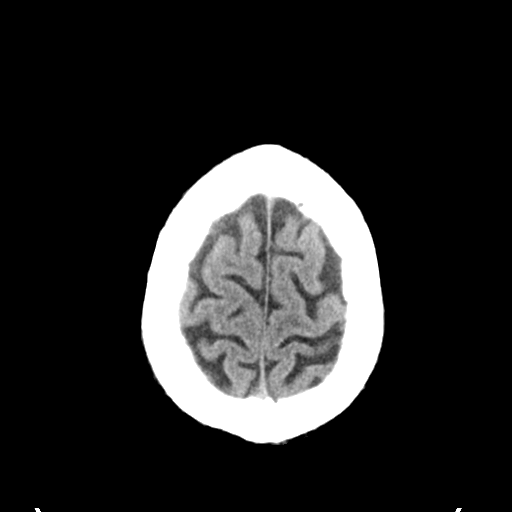
[im 34/37  brain]
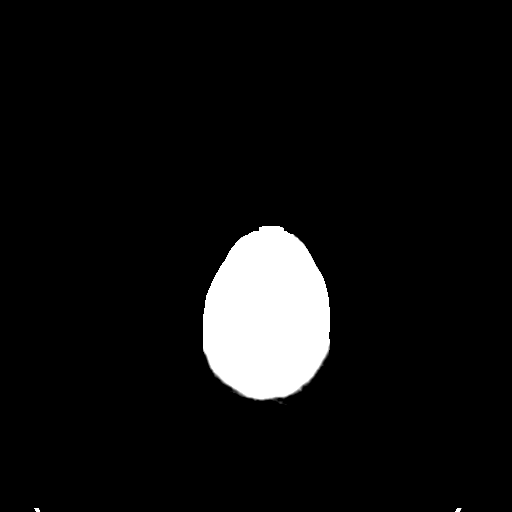
[im 34/37  bone]
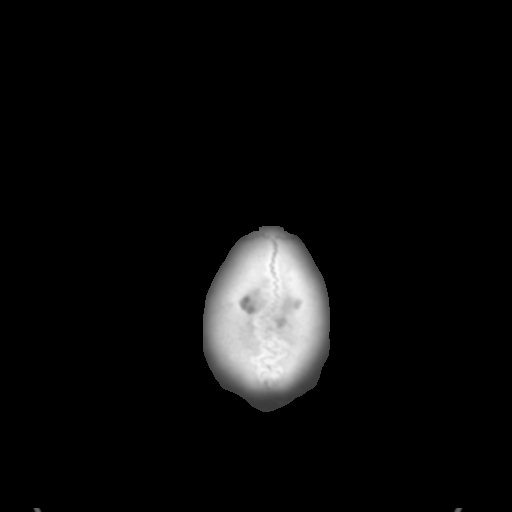

[Series 5: head 3.0 mpr cor · coronal · 0.36mm/px · 3 of 79 slices shown]
[im 27/79  brain]
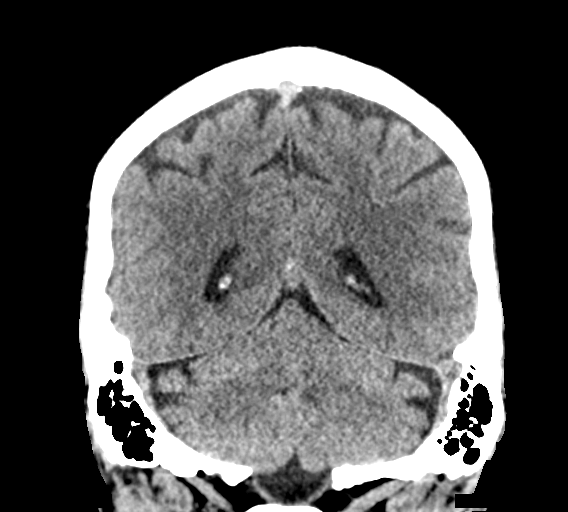
[im 35/79  brain]
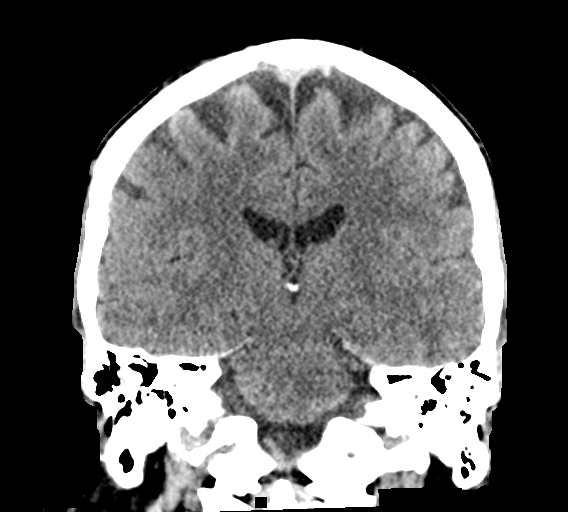
[im 44/79  brain]
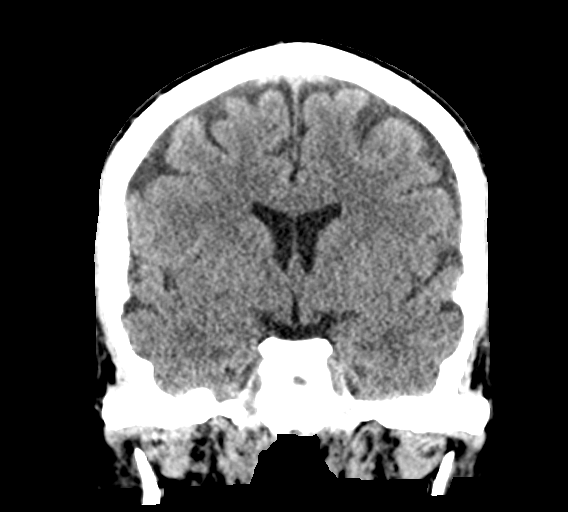

[Series 6: head 3.0 mpr sag · sagittal · 0.36mm/px · 3 of 65 slices shown]
[im 22/65  brain]
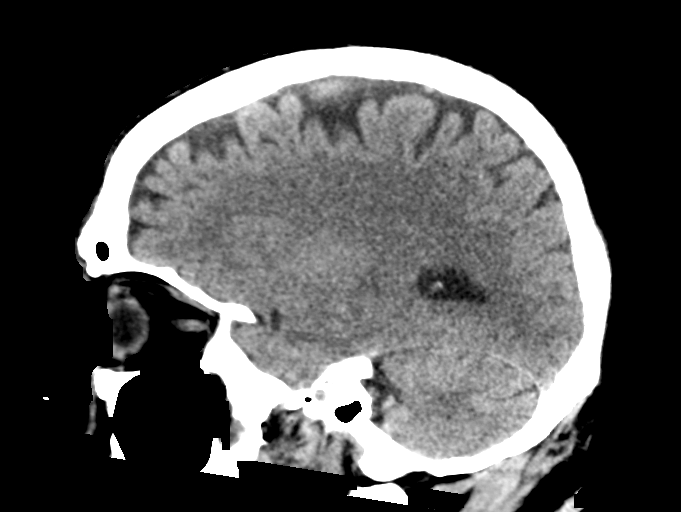
[im 33/65  brain]
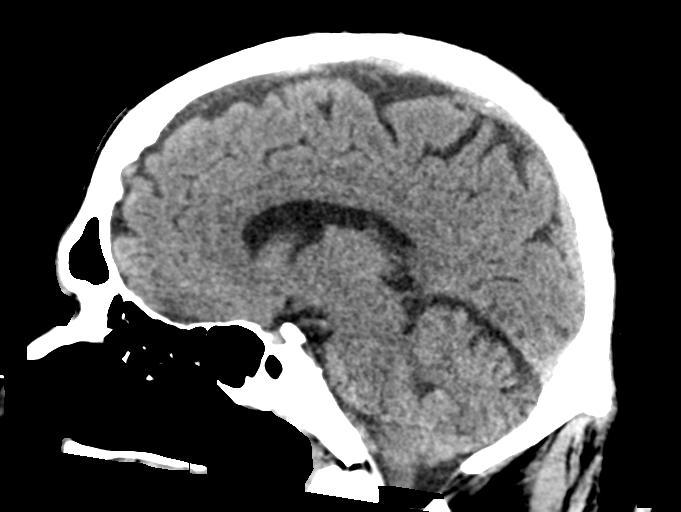
[im 43/65  brain]
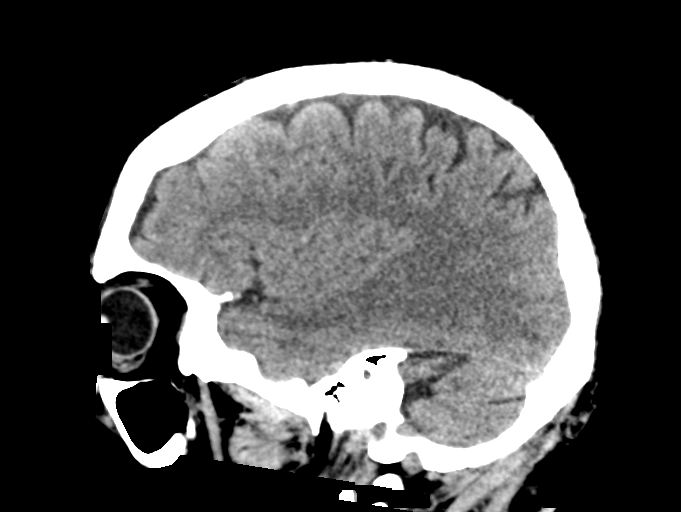

[15 of 47 positions shown; findings below may reference images not displayed]

FINDINGS: Brain:

Cerebral volume is normal for age.

There is no acute intracranial hemorrhage.

No demarcated cortical infarct.

No extra-axial fluid collection.

No evidence of intracranial mass.

No midline shift.

Vascular: No hyperdense vessel.

Skull: Normal. Negative for fracture or focal lesion.

Sinuses/Orbits: Visualized orbits show no acute finding. Near
complete opacification of the left sphenoid sinus.
IMPRESSION: No evidence of acute intracranial abnormality.

Left sphenoid sinusitis.

## 2020-10-20 MED ORDER — ZINC SULFATE 220 (50 ZN) MG PO CAPS
220.0000 mg | ORAL_CAPSULE | Freq: Every day | ORAL | Status: DC
  Administered 2020-10-20 – 2020-10-21 (×2): 220 mg via ORAL
  Filled 2020-10-20 (×2): qty 1

## 2020-10-20 MED ORDER — POTASSIUM CHLORIDE 10 MEQ/100ML IV SOLN
10.0000 meq | INTRAVENOUS | Status: AC
  Administered 2020-10-20 (×2): 10 meq via INTRAVENOUS
  Filled 2020-10-20 (×2): qty 100

## 2020-10-20 MED ORDER — LACTULOSE 10 GM/15ML PO SOLN
20.0000 g | Freq: Three times a day (TID) | ORAL | Status: DC
  Administered 2020-10-20 – 2020-10-21 (×3): 20 g via ORAL
  Filled 2020-10-20 (×3): qty 30

## 2020-10-20 MED ORDER — POTASSIUM CHLORIDE 10 MEQ/100ML IV SOLN
INTRAVENOUS | Status: AC
  Administered 2020-10-20: 10 meq via INTRAVENOUS
  Filled 2020-10-20: qty 100

## 2020-10-20 MED ORDER — INSULIN ASPART 100 UNIT/ML ~~LOC~~ SOLN
0.0000 [IU] | SUBCUTANEOUS | Status: DC
  Administered 2020-10-20 (×2): 2 [IU] via SUBCUTANEOUS

## 2020-10-20 MED ORDER — PANTOPRAZOLE SODIUM 40 MG PO TBEC
40.0000 mg | DELAYED_RELEASE_TABLET | Freq: Every day | ORAL | Status: DC
  Administered 2020-10-20 – 2020-10-21 (×2): 40 mg via ORAL
  Filled 2020-10-20 (×2): qty 1

## 2020-10-20 MED ORDER — FUROSEMIDE 20 MG PO TABS
20.0000 mg | ORAL_TABLET | Freq: Every day | ORAL | Status: DC
  Administered 2020-10-20 – 2020-10-21 (×2): 20 mg via ORAL
  Filled 2020-10-20 (×2): qty 1

## 2020-10-20 MED ORDER — SPIRONOLACTONE 25 MG PO TABS
25.0000 mg | ORAL_TABLET | Freq: Every day | ORAL | Status: DC
  Administered 2020-10-20 – 2020-10-21 (×2): 25 mg via ORAL
  Filled 2020-10-20 (×2): qty 1

## 2020-10-20 MED ORDER — LEVOCARNITINE 1 GM/10ML PO SOLN
330.0000 mg | Freq: Three times a day (TID) | ORAL | Status: DC
  Administered 2020-10-20 – 2020-10-21 (×3): 330 mg via ORAL
  Filled 2020-10-20 (×5): qty 3.3

## 2020-10-20 MED ORDER — FERROUS SULFATE 325 (65 FE) MG PO TABS
325.0000 mg | ORAL_TABLET | Freq: Every day | ORAL | Status: DC
  Administered 2020-10-21: 325 mg via ORAL
  Filled 2020-10-20 (×2): qty 1

## 2020-10-20 MED ORDER — VITAMIN D 25 MCG (1000 UNIT) PO TABS
1000.0000 [IU] | ORAL_TABLET | Freq: Every day | ORAL | Status: DC
  Administered 2020-10-20 – 2020-10-21 (×2): 1000 [IU] via ORAL
  Filled 2020-10-20 (×2): qty 1

## 2020-10-20 MED ORDER — ADULT MULTIVITAMIN W/MINERALS CH
1.0000 | ORAL_TABLET | Freq: Every day | ORAL | Status: DC
  Administered 2020-10-20: 1 via ORAL
  Filled 2020-10-20: qty 1

## 2020-10-20 MED ORDER — RIFAXIMIN 550 MG PO TABS
550.0000 mg | ORAL_TABLET | Freq: Two times a day (BID) | ORAL | Status: DC
  Administered 2020-10-20 – 2020-10-21 (×3): 550 mg via ORAL
  Filled 2020-10-20 (×5): qty 1

## 2020-10-20 NOTE — Progress Notes (Signed)
° °  Subjective:  Patient evaluated at bedside this AM. Patient is awake and alert this morning x4. Notes mild frontal headache. Also mentions he is thirsty. He takes 2-3 medications at home and notes that he has been taking these at home regularly. No fevers, chest pain, abdominal pain.   Objective:  Vital signs in last 24 hours: Vitals:   10/19/20 1554 10/19/20 2011 10/20/20 0013 10/20/20 0418  BP: 132/70 (!) 141/74 140/66 118/64  Pulse: 87 87 83 81  Resp: 18 18 18 18   Temp: 98.8 F (37.1 C) 98.8 F (37.1 C) 98.9 F (37.2 C) 98.2 F (36.8 C)  TempSrc: Oral Oral Oral   SpO2: 97% 98% 97% 96%  Weight:  93.4 kg    Height:       Physical Exam: General: Chronically ill-appearing, no acute distress CV: Regular rate, rhythm. No m/r/g Abdomen: Soft, non-tender, non-distended  Assessment/Plan: Martin Strong is 54yo male with NASH cirrhosis s/p TIPS and TIPS revision, previous variceal bleeding with banding admitted 11/6 for acute hepatic encephalopathy most likely 2/2 medication non-adherence.  Active Problems:   Altered mental status  #Grade II Hepatic Encephalopathy Patient's mentation much improved on examination this morning. Received lactulose enema in ED yesterday and subsequently had 3 BM. Reports he has taken his medication at home, but can not name which medications he is taking. Discussed with wife and she is also not certain he takes his medication regularly. Continues to be afebrile without abdominal pain, no signs of SBP. Afebrile, no leukocytosis. Tbili remaining at 2.0, good renal function. CT head was not able to be obtained previously given mental status, will get today. If passes bedside swallow eval will re-start home po medications. 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 No  signs of SBP or decompensated cirrhosis on exam. Mentation much improved from admission. PLT 114 > 73, will continue mechanical VTE prophylaxis. LFT's normal, TBili stable at 2.0. Will continue holding medication until bedside swallow eval. - Hold home aldactone, levocamitime, furosemide  #Type 2 diabetes mellitus #Hyperglycemia Glucose 84 this morning from 300 on admission. Continues to be NPO pending eval. Currently on SSI, will adjust as patient can advance diet and tolerate po. - Hold home meds - SSI - CBG q4  #Hypokalemia K+ 3.3 < 4.1. Likely 2/2 GI loss from multiple BM yesterday s/p enema. Repleting w/ IV potassium, will re-check BMP this evening. - R/p BMP  DIET: NPO pending swallow eval IVF: n/a DVT PPX: SCDs BOWEL: Lactulose CODE: FULL FAM COM: Spoke with patient's wife, Martin Strong, yesterday 11/6. Patient spoke with wife this AM once his mentation improved.   Prior to Admission Living Arrangement: Home Anticipated Discharge Location: Home Barriers to Discharge: medical management Dispo: Anticipated discharge in approximately 1-2 day(s).   Sanjuan Dame, MD 10/20/2020, 6:29 AM Pager: [phone] After 5pm on weekdays and 1pm on weekends: On Call pager [phone]

## 2020-10-20 NOTE — Progress Notes (Signed)
Paged attending group regarding Cardiac monitoring expired.

## 2020-10-20 NOTE — Plan of Care (Signed)
  Problem: Activity: Goal: Risk for activity intolerance will decrease Outcome: Progressing   Problem: Nutrition: Goal: Adequate nutrition will be maintained Outcome: Progressing   

## 2020-10-21 ENCOUNTER — Encounter (HOSPITAL_COMMUNITY): Payer: Self-pay | Admitting: Internal Medicine

## 2020-10-21 ENCOUNTER — Other Ambulatory Visit: Payer: Self-pay

## 2020-10-21 LAB — CBC
HCT: 29.7 % — ABNORMAL LOW (ref 39.0–52.0)
Hemoglobin: 10.9 g/dL — ABNORMAL LOW (ref 13.0–17.0)
MCH: 35.7 pg — ABNORMAL HIGH (ref 26.0–34.0)
MCHC: 36.7 g/dL — ABNORMAL HIGH (ref 30.0–36.0)
MCV: 97.4 fL (ref 80.0–100.0)
Platelets: 62 10*3/uL — ABNORMAL LOW (ref 150–400)
RBC: 3.05 MIL/uL — ABNORMAL LOW (ref 4.22–5.81)
RDW: 14 % (ref 11.5–15.5)
WBC: 4.7 10*3/uL (ref 4.0–10.5)
nRBC: 0 % (ref 0.0–0.2)

## 2020-10-21 LAB — COMPREHENSIVE METABOLIC PANEL
ALT: 29 U/L (ref 0–44)
AST: 34 U/L (ref 15–41)
Albumin: 3 g/dL — ABNORMAL LOW (ref 3.5–5.0)
Alkaline Phosphatase: 65 U/L (ref 38–126)
Anion gap: 9 (ref 5–15)
BUN: 15 mg/dL (ref 6–20)
CO2: 22 mmol/L (ref 22–32)
Calcium: 8.4 mg/dL — ABNORMAL LOW (ref 8.9–10.3)
Chloride: 107 mmol/L (ref 98–111)
Creatinine, Ser: 0.97 mg/dL (ref 0.61–1.24)
GFR, Estimated: >60 mL/min (ref >60)
Glucose, Bld: 216 mg/dL — ABNORMAL HIGH (ref 70–99)
Potassium: 3.4 mmol/L — ABNORMAL LOW (ref 3.5–5.1)
Sodium: 138 mmol/L (ref 135–145)
Total Bilirubin: 1.8 mg/dL — ABNORMAL HIGH (ref 0.3–1.2)
Total Protein: 6 g/dL — ABNORMAL LOW (ref 6.5–8.1)

## 2020-10-21 LAB — GLUCOSE, CAPILLARY
Glucose-Capillary: 219 mg/dL — ABNORMAL HIGH (ref 70–99)
Glucose-Capillary: 196 mg/dL — ABNORMAL HIGH (ref 70–99)
Glucose-Capillary: 285 mg/dL — ABNORMAL HIGH (ref 70–99)

## 2020-10-21 MED ORDER — POTASSIUM CHLORIDE CRYS ER 20 MEQ PO TBCR
20.0000 meq | EXTENDED_RELEASE_TABLET | Freq: Every day | ORAL | Status: DC
  Administered 2020-10-21: 20 meq via ORAL
  Filled 2020-10-21: qty 1

## 2020-10-21 MED ORDER — RIFAXIMIN 550 MG PO TABS: 550.0000 mg | ORAL_TABLET | Freq: Two times a day (BID) | ORAL | 0 refills | Status: DC

## 2020-10-21 MED ORDER — INSULIN ASPART 100 UNIT/ML ~~LOC~~ SOLN
0.0000 [IU] | Freq: Three times a day (TID) | SUBCUTANEOUS | Status: DC
  Administered 2020-10-21: 3 [IU] via SUBCUTANEOUS

## 2020-10-21 NOTE — Discharge Summary (Signed)
Name: Martin Strong MRN: [id number] DOB: October 11, 1966 54 y.o. PCP: [name], NP  Date of Admission: 10/19/2020  9:23 AM Date of Discharge: 10/21/2020 Attending Physician: Dr Lalla Brothers   Discharge Diagnosis: 1. Acute hepatic encephalopathy 2. NASH Cirrhosis Marcello Moores Class B 3. Diabetes mellitus  4. Hypokalemia   Discharge Medications: Allergies as of 10/21/2020      Reactions   Nadolol Itching, Nausea Only      Medication List    TAKE these medications   cholecalciferol 1000 units tablet Commonly known as: VITAMIN D Take 1,000 Units by mouth daily.   ciprofloxacin 500 MG tablet Commonly known as: Cipro Take Cipro 750 mg weekly What changed:   how much to take  how to take this  when to take this   Farxiga 10 MG Tabs tablet Generic drug: dapagliflozin propanediol Take 10 mg by mouth at bedtime.   feeding supplement Liqd Take 237 mLs by mouth 2 (two) times daily between meals.   ferrous sulfate 325 (65 FE) MG tablet Commonly known as: KP Ferrous Sulfate Take 1 tablet (325 mg total) by mouth daily with breakfast. What changed: when to take this   furosemide 40 MG tablet Commonly known as: LASIX Take 2 tablets (80 mg total) by mouth daily. What changed: how much to take   glipiZIDE 10 MG 24 hr tablet Commonly known as: GLUCOTROL XL Take 10 mg by mouth 2 (two) times daily.   lactulose 10 GM/15ML solution Commonly known as: CHRONULAC Take 30 mLs (20 g total) by mouth 3 (three) times daily.   levOCARNitine 330 MG tablet Commonly known as: CARNITOR Take 330 mg by mouth 3 (three) times daily.   metFORMIN 1000 MG tablet Commonly known as: GLUCOPHAGE Take 1,000 mg by mouth 2 (two) times daily with a meal.   multivitamin with minerals Tabs tablet Take 1 tablet by mouth at bedtime.   pantoprazole 40 MG tablet Commonly known as: PROTONIX Take 40 mg by mouth daily.   potassium chloride 10 MEQ tablet Commonly known as: KLOR-CON Take 20  mEq by mouth daily.   propranolol 40 MG tablet Commonly known as: INDERAL Take 0.5 tablets (20 mg total) by mouth 2 (two) times daily. What changed: how much to take   spironolactone 25 MG tablet Commonly known as: ALDACTONE Take 1 tablet (25 mg total) by mouth daily. What changed: Another medication with the same name was removed. Continue taking this medication, and follow the directions you see here.   Zinc 50 MG Tabs Take 22 mg by mouth daily.       Disposition and follow-up:   Martin Strong was discharged from [facility] in Stable condition.  At the hospital follow up visit please address:  1.  Acute hepatic encephalopathy: Recommend continuing home lactulose with goal of 3-4 BM's per day; Continue ciprofloxacin weekly for SBP prophylaxis  NASH cirrhosis: Child Pughs class B with MELD of 14; Platelets 42 on discharge; no bleeding; LFTs wnl; discharged on home aldactone, levocarnitine and lasix; f/u LFTs and CBC  Diabetes mellitus: Continue glipizide, farxiga and metformin on discharge  Hypokalemia: K 3.4 on discharge; resumed on home oral potassium supplement; f/u BMP  2.  Labs / imaging needed at time of follow-up: CBC, CMP  3.  Pending labs/ test needing follow-up: None   Follow-up Appointments:  Follow-up Information    [name], NP Follow up.   Why: Please call and make an appointment to see  your primary care doctor this week for a hospital follow-up. Contact information: [place] [address] [phone]               Hospital Course by problem list: 1. 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 Patient noted to have abdominal pain without fevers, falls or head trauma; however this appeared to be chronic. Patient was somnolent on arrival but  combative with arousal. Abdominal exam was benign and patient was afebrile without acute elevations in his transaminases. Ammonia was elevated. Lipase mildly elevated. Patient was given a lactulose enema with improvement in his encephalopathy. He was also given ceftriaxone for SBP prophylaxis. He was continued on his oral lactulose on day 2 of hospitalization with goal of 3-4 BM per day. Ceftriaxone was discontinued and patient resumed on his home ciprofloxacin dosing. Patient noted to have improvement in his mental status to baseline on day of discharge. He notes that he was unable to afford his rifaximin but had been compliant with lactulose. Discussed discharge recommendation for continued lactulose with goal of 3-4 BM per day.   2: NASH Cirrhosis; Marcello Moores Class B S/p TIPS procedure with subsequent revision in April 2021. MELD score 14 during this admission. He was noted to have thrombocytopenia with platelet count 62 on day of discharge. No signs of bleeding noted and LFTs wnl with T.bilirubin of 1.8. Patient resumed on his home medications of spirinolactone, levocarnitine and furosemide on discharge. Recommend to f/u with PCP.  3. Diabetes Mellitus Most recent A1c of 7.9 in September 2021. Patient managed with SSI during admission. He was discharged on home regimen of metformin, glipizide and [place].   4. Hypokalemia  Patient with history of chronic hypokalemia for which he is on oral potassium supplementation at home. K 3.4 on day of discharge. Patient resumed on home oral potassium. Recommend to follow up BMP.   Discharge Vitals:   BP 118/64 (BP Location: Right Arm)    Pulse 88    Temp 97.6 F (36.4 C) (Oral)    Resp 18    Ht 6' 1"  (1.854 m)    Wt 93.4 kg    SpO2 97%    BMI 27.17 kg/m   Pertinent Labs, Studies, and Procedures:  CBC Latest Ref Rng & Units 10/21/2020 10/20/2020 10/19/2020  WBC 4.0 - 10.5 K/uL 4.7 5.2 6.5  Hemoglobin 13.0 - 17.0 g/dL 10.9(L) 11.1(L) 13.2  Hematocrit 39 - 52  % 29.7(L) 31.2(L) 35.6(L)  Platelets 150 - 400 K/uL 62(L) 73(L) 114(L)   CMP Latest Ref Rng & Units 10/21/2020 10/20/2020 10/20/2020  Glucose 70 - 99 mg/dL 216(H) 253(H) 84  BUN 6 - 20 mg/dL 15 16 16   Creatinine 0.61 - 1.24 mg/dL 0.97 0.90 0.79  Sodium 135 - 145 mmol/L 138 137 144  Potassium 3.5 - 5.1 mmol/L 3.4(L) 4.2 3.3(L)  Chloride 98 - 111 mmol/L 107 106 114(H)  CO2 22 - 32 mmol/L 22 22 21(L)  Calcium 8.9 - 10.3 mg/dL 8.4(L) 8.6(L) 8.8(L)  Total Protein 6.5 - 8.1 g/dL 6.0(L) - 6.2(L)  Total Bilirubin 0.3 - 1.2 mg/dL 1.8(H) - 2.0(H)  Alkaline Phos 38 - 126 U/L 65 - 68  AST 15 - 41 U/L 34 - 34  ALT 0 - 44 U/L 29 - 31   Lactic Acid, Venous 0.5 - 1.9 mmol/L 1.3  3.6High Panic CM  Prothrombin Time 11.4 - 15.2 seconds 16.2High  15.8High  17.1High  16.0High   INR 0.8 - 1.2 1.4High  1.3High CM  1.5High CM  1.3High CM   Comment: (NOTE)    Lipase 11 - 51 U/L 60High    Ammonia 9 - 35 umol/L 290High     Specimen Description BLOOD RIGHT HAND   Special Requests BOTTLES DRAWN AEROBIC ONLY Blood Culture results may not be optimal due to an inadequate volume of blood received in culture bottles  Culture NO GROWTH 3 DAYS  Performed at [facility] [address], [place], [place] [address]   CT HEAD WO CONTRAST 10/20/2020: IMPRESSION: No evidence of acute intracranial abnormality. Left sphenoid sinusitis.  Discharge Instructions: Discharge Instructions    Call MD for:   Complete by: As directed    Confusion, increasing abdominal swelling, or bloody vomit or stool   Call MD for:  difficulty breathing, headache or visual disturbances   Complete by: As directed    Call MD for:  extreme fatigue   Complete by: As directed    Call MD for:  hives   Complete by: As directed    Call MD for:  persistant dizziness or light-headedness   Complete by: As directed    Call MD for:  persistant nausea and vomiting   Complete by: As directed    Call MD for:  redness,  tenderness, or signs of infection (pain, swelling, redness, odor or green/yellow discharge around incision site)   Complete by: As directed    Call MD for:  severe uncontrolled pain   Complete by: As directed    Call MD for:  temperature >100.4   Complete by: As directed    Diet - low sodium heart healthy   Complete by: As directed    Discharge instructions   Complete by: As directed    Martin Strong, I am glad you are feeling better! You were admitted because you were confused and had high ammonia levels. We are unsure why exactly this happened, but you have improved with lactulose over the last two days. Please see the following notes:  -START taking rifaximin 572m twice daily. Our pharmacists have brought you this medication to your room.  -Continue taking the lactulose and other supplements that you were previously prescribed. You received IV antibiotics over the weekend, so you can wait to take the ciprofloxacin until next week.  -Please call and make an appointment with your primary care doctor this week for a hospital follow-up.  It was a pleasure meeting you, Mr. PBeckom I wish you the best and hope you stay happy and healthy!  Thank you, PSanjuan Dame MD   Increase activity slowly   Complete by: As directed       Signed: AHarvie Heck MD 10/22/2020, 3:55 PM   Pager: [phone]

## 2020-10-21 NOTE — Progress Notes (Signed)
   Subjective:  Patient reports that he is feeling well this morning and has a great appetite. He has not been taking his rifaximin due to the high cost. He was taking the lactulose at home regularly. He reports that he was feeling "sick on his stomach" after eating a bowl of applejacks on Friday. He has good follow up as outpatient.   Objective:  Vital signs in last 24 hours: Vitals:   10/20/20 0930 10/20/20 1701 10/20/20 2008 10/21/20 0543  BP: 123/68 130/90 (!) 102/44 123/68  Pulse: 82 88 86 85  Resp: 18 18 18 18   Temp: 98.2 F (36.8 C) 98.4 F (36.9 C) 99 F (37.2 C) 97.6 F (36.4 C)  TempSrc: Oral  Oral Oral  SpO2: 98% 98% 97% 96%  Weight:      Height:       Physical Exam: General: Chronically ill-appearing, no acute distress Eyes: No scleral icterus present. Neuro: Awake, alert, oriented x3.  Assessment/Plan: Mr. Litt is 54yo male with NASH cirrhosis s/p TIPS and TIPS revision, previous variceal bleeding with banding admitted 11/6 for acute hepatic encephalopathy most likely 2/2 medication non-adherence, now back to baseline and tolerating po.  Active Problems:   Hepatic encephalopathy (HCC)   Altered mental status  #Grade II Hepatic Encephalopathy Patient doing well this morning, back to baseline. Eating well over last 24 hours after passing swallow eval. Had three BM, continuing to give lactulose. No sign of infection, afebrile, no leukocytosis, no abdominal pain. BCx negative. CT head negative. He was unable to get rifaximin from [facility] due to high cost. Will d/c on lactulose and have him follow-up with his PCP. - C/w home lactulose - Patient to continue cipro q week for SBP prophylaxis  #NASH cirrhosis #Thrombocytopenia Child-Pughs Class B, MELD 14. PLT 62 this AM, no obvious signs of bleeding. LFT's normal, TBili 1.8. Patient to continue his home medications upon discharge. - Re-start home aldactone, levocarnitine, furosemide  #Type 2 diabetes  mellitus #Hyperglycemia Glucose controlled since admission. Continue with SSI while inpatient and he will re-start home medications at discharge.   - SSI - Re-start Farxiga, glipizide, metformin at discharge - F/u with PCP  #Hypokalemia BMP yesterday evening 4.2 s/p repletion. K+ 3.4 this AM. Patient has chronic hypokalemia for which he takes oral potassium supplements at home. Will re-start home supplement and encourage use at home given he is taking lactulose with goal 3-4 BM/day. - Re-start oral potassium supplement  DIET: HH IVF: n/a DVT PPX: SCDs BOWEL: Lactulose CODE: FULL FAM COM: Patient spoke with wife this AM, will have family member pick him up this afternoon.  Prior to Admission Living Arrangement: Home Anticipated Discharge Location: Home Barriers to Discharge: none Dispo: Anticipated discharge in approximately 0 day(s).   Sanjuan Dame, MD 10/21/2020, 6:23 AM Pager: [phone] After 5pm on weekdays and 1pm on weekends: On Call pager [phone]

## 2020-10-21 NOTE — TOC Initial Note (Signed)
Transition of Care [facility]) - Initial/Assessment Note    Patient Details  Name: Martin Strong MRN: [id number] Date of Birth: [birth year]  Transition of [facility]) CM/SW Contact:    Bartholomew Crews, RN Phone Number: [phone] 10/21/2020, 12:42 PM  Clinical Narrative:                  Damaris Schooner with [facility] pharmacy concerning prescription for rifaximin. Advised that patient's copay is $1502.38/month. Spoke with patient at the bedside who stated that he could not afford this. Paged Dr. Collene Gobble to advise. Patient to continue with his lactolose regimen, and stated that patient has this medication at home. Returned to bedside to update patient. He confirmed that he does have lactolose at home. He has called his brother to pick him up. Patient stated that he has recently been approved for disability. He stated that his Medicare will begin in 2 years, but he has recently been approved for [facility] Medicaid. Advised to discuss with his MD outpatient once his account is set up, and MD can call in prior authorization. Patient verbalized understanding. No further TOC needs identified.   Expected Discharge Plan: Home/Self Care Barriers to Discharge: No Barriers Identified   Patient Goals and CMS Choice Patient states their goals for this hospitalization and ongoing recovery are:: return home with wife CMS Medicare.gov Compare Post Acute Care list provided to:: Patient Choice offered to / list presented to : NA  Expected Discharge Plan and Services Expected Discharge Plan: Home/Self Care In-house Referral: NA Discharge Planning Services: CM Consult Post Acute Care Choice: NA Living arrangements for the past 2 months: Single Family Home Expected Discharge Date: 10/21/20               DME Arranged: N/A DME Agency: NA       HH Arranged: NA [place] Agency: NA        Prior Living Arrangements/Services Living arrangements for the past 2 months: Single Family Home Lives with:: Self, Spouse Patient language  and need for interpreter reviewed:: Yes Do you feel safe going back to the place where you live?: Yes      Need for Family Participation in Patient Care: No (Comment) Care giver support system in place?: Yes (comment)   Criminal Activity/Legal Involvement Pertinent to Current Situation/Hospitalization: No - Comment as needed  Activities of Daily Living Home Assistive Devices/Equipment: CBG Meter, Hand-held shower hose ADL Screening (condition at time of admission) Patient's cognitive ability adequate to safely complete daily activities?: Yes Is the patient deaf or have difficulty hearing?: No Does the patient have difficulty seeing, even when wearing glasses/contacts?: No Does the patient have difficulty concentrating, remembering, or making decisions?: No Patient able to express need for assistance with ADLs?: Yes Does the patient have difficulty dressing or bathing?: No Independently performs ADLs?: Yes (appropriate for developmental age) Does the patient have difficulty walking or climbing stairs?: No Weakness of Legs: None Weakness of Arms/Hands: None  Permission Sought/Granted                  Emotional Assessment Appearance:: Appears stated age Attitude/Demeanor/Rapport: Engaged Affect (typically observed): Accepting Orientation: : Oriented to Self, Oriented to  Time, Oriented to Place, Oriented to Situation Alcohol / Substance Use: Not Applicable Psych Involvement: No (comment)  Admission diagnosis:  Hepatic encephalopathy ([place]) [K72.90] Dehydration [E86.0] Altered mental status [R41.82] Altered mental status, unspecified altered mental status type [R41.82] Patient Active Problem List   Diagnosis Date Noted  . Altered mental status 10/19/2020  .  Abdominal distention   . Encephalopathy acute   . Hepatic encephalopathy ([place]) 05/13/2020  . Liver cirrhosis secondary to NASH ([place]) 03/12/2020  . SBP (spontaneous bacterial peritonitis) ([place]) 01/24/2020  . Esophageal  varices ([place]) 07/21/2019  . Dysphagia 07/21/2019  . GERD (gastroesophageal reflux disease) 07/21/2019  . Gastritis and gastroduodenitis   . 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 Portal hypertensive gastropathy ([place])   . Acute upper GI bleed 10/06/2018  . Cirrhosis ([place]) 10/06/2018  . Hypertension 10/06/2018  . Diabetes mellitus without complication ([place]) 09/81/1914   PCP:  [name], NP Pharmacy:   [facility], [place] [address] Phone: [phone] Fax: [place] [address] [place] [address] Phone: [phone] Fax: [phone]  [facility] [address] [place] [address] Phone: [phone] Fax: [phone]     Social Determinants of Health (SDOH) Interventions    Readmission Risk Interventions No flowsheet data found.

## 2020-10-22 ENCOUNTER — Telehealth: Payer: Self-pay | Admitting: Gastroenterology

## 2020-10-22 MED ORDER — RIFAXIMIN 550 MG PO TABS: 550.0000 mg | ORAL_TABLET | Freq: Two times a day (BID) | ORAL | 5 refills | Status: AC

## 2020-10-22 NOTE — Telephone Encounter (Signed)
Sent rx to [facility] for Xifaxin, notified the patient we should her something by the end of the week

## 2020-10-22 NOTE — Telephone Encounter (Signed)
Left a voicemail for the patient to contact the office.

## 2020-10-22 NOTE — Telephone Encounter (Signed)
Absolutely.  Please send in Rx for rifaximin 550 mg p.o. twice daily.  90-day supply with RF 5.  Thank you.

## 2020-10-22 NOTE — Telephone Encounter (Signed)
Galvin contacted the office stating he was wanting to see if we would send out a xifaxin rx for him. He is now on disability and is covered by Medicaid as his secondary. He would like to see if they would cover it.

## 2020-10-24 LAB — CULTURE, BLOOD (ROUTINE X 2)
Culture: NO GROWTH
Culture: NO GROWTH

## 2020-10-28 NOTE — Telephone Encounter (Signed)
Contacted [facility] and spoke with Eustace Quail, she stated that the PA has been initiated to patients insurance and they are waiting for the reply.

## 2020-11-06 NOTE — Telephone Encounter (Signed)
Per Cardell Peach with [facility] the xifaxin was approved yesterday and the patient has a $3.00 copay. She will reach out to the patient today.

## 2020-11-12 ENCOUNTER — Other Ambulatory Visit: Payer: Self-pay | Admitting: Interventional Radiology

## 2020-11-12 DIAGNOSIS — R188 Other ascites: Secondary | ICD-10-CM

## 2020-11-12 DIAGNOSIS — K7460 Unspecified cirrhosis of liver: Secondary | ICD-10-CM

## 2020-11-13 ENCOUNTER — Encounter: Payer: Self-pay | Admitting: Gastroenterology

## 2020-11-13 ENCOUNTER — Ambulatory Visit (INDEPENDENT_AMBULATORY_CARE_PROVIDER_SITE_OTHER): Payer: 59 | Admitting: Gastroenterology

## 2020-11-13 VITALS — BP 114/60 | HR 76 | Ht 72.0 in | Wt 215.4 lb

## 2020-11-13 DIAGNOSIS — K7581 Nonalcoholic steatohepatitis (NASH): Secondary | ICD-10-CM | POA: Diagnosis not present

## 2020-11-13 DIAGNOSIS — K7290 Hepatic failure, unspecified without coma: Secondary | ICD-10-CM | POA: Diagnosis not present

## 2020-11-13 DIAGNOSIS — M6284 Sarcopenia: Secondary | ICD-10-CM

## 2020-11-13 DIAGNOSIS — K3189 Other diseases of stomach and duodenum: Secondary | ICD-10-CM

## 2020-11-13 DIAGNOSIS — K7460 Unspecified cirrhosis of liver: Secondary | ICD-10-CM

## 2020-11-13 DIAGNOSIS — I8500 Esophageal varices without bleeding: Secondary | ICD-10-CM

## 2020-11-13 DIAGNOSIS — Z8619 Personal history of other infectious and parasitic diseases: Secondary | ICD-10-CM | POA: Diagnosis not present

## 2020-11-13 DIAGNOSIS — E60 Dietary zinc deficiency: Secondary | ICD-10-CM

## 2020-11-13 DIAGNOSIS — K766 Portal hypertension: Secondary | ICD-10-CM | POA: Diagnosis not present

## 2020-11-13 DIAGNOSIS — K7682 Hepatic encephalopathy: Secondary | ICD-10-CM

## 2020-11-13 NOTE — Progress Notes (Signed)
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 [facility] [place] [facility] [place]. Cirrhosis complicated by esophageal varices with bleeding requiring multiple EGDs with banding in the past along with propranolol for dual prophylaxis.Developed large volume ascites in 09/2019. Previously diuretic intolerant, required serial large-volume paracentesis, TIPS placement with subsequent revision,and low-sodium diet, now back ondiuretics. Admitted with SBP and AKI in 01/2020.Admitted 04/2020 with hepatic encephalopathy-start lactulose/rifaximin.Follows with [facility].   Cirrhosis Hx: - Etiology: NASH - Diagnosed 2017 - Liver LX:BWIO - Complications: EV with admission for bleeding 09/2018 treated with EGD with EVL, large volume ascitesserial LVP starting 09/2019 TIPS 02/2020 (revision 03/2020), SBP 01/2020, hepatic encephalopathy 04/2020, sarcopenia -HepaticRx:Lactulose, rifaximin, Lasix 80 mg/day, Aldactone 25 mg/day, Cipro 750 mg/week, levocarnitine -Previous hepatic Rx: Propranolol (dual prophy)and PPI discontinued due to SBP.History of diuretic intolerance and AKI. Follows with Nephrology, managing Lasix/Aldactone - [facility] Screening:US 08/2020, MRI 11/2019: Ascites, no HCC. AFP <0.9. -03/13/2020: TIPSprocedure -04/11/2020: TIPS revision -05/09/2020: Patent TIPS with high velocity flow throughout -Annual lab check: Completed 05/2020 -HAV and HBV vaccine series started 05/2019 - MELD:14(fluctuates 11-14) - Child Pugh:B(9pts)  Endoscopic history: -EGD x3 in 2017 in [place], [place] for esophageal varices with banding -EGD (09/2018, Dr. Bryan Lemma): Grade 2 esophageal varices  with stigmata of recent bleeding requiring banding x2 with complete eradication, portal hypertensive gastropathy without bleeding. - EGD (12/2018, Dr. Bryan Lemma): Grade II esophageal varices, 6 bands placed with deflation. Normal Z line at 46 cm, severe portal HTN gastropathy with contact oozing, small GOV1, normal duodenum. H pylori serologies negative -EGD (02/2019, Dr. Bryan Lemma): Grade 2 esophageal varices, 4 bands placed with complete eradication.Portal hypertensive gastropathy/2 adenopathy -EGD (07/2019, Dr. Bryan Lemma): Done for dysphagia. grade 1 esophageal varices, multiple post variceal banding scars in the lower third with 1 area of luminal deformity, likely consistent with prior deep banding site. The lumen was mildly narrowed, but easily traversed and no additional endoscopic dilation performed. Mild esophagitis at the GEJ, portal hypertensive gastropathy, peptic antritis/duodenitis. Papilla prominent.  HPI:     Patient is a 54 y.o. male presenting to the Gastroenterology Clinic for hospital follow-up.  Was admitted to [facility] 11/6-8, 2021 with acute hepatic encephalopathy (ammonia 290) suspected due to dedication noncompliance/missed doses.  Treated with lactulose and Xifaxan and discharged at his baseline mental status.  No other medication changes made.  Scheduled to receive outpatient Xifaxan  later this week (supply chain issues).  States he is doing well since hospital discharge.  Increased p.o. intake.  Some increase in energy.  He is comanaged in the [facility], and was last seen by  [place] on 09/23/2020 has follow-up scheduled 02/2021.   Review of systems:     No chest pain, no SOB, no fevers, no urinary sx   Past Medical History:  Diagnosis Date  . Anemia   . Arthritis   . Cirrhosis ([place])   . Diabetes mellitus without complication ([place])   . Dyspnea   . Fatty liver   . GERD (gastroesophageal reflux disease)   . GI bleed   .  Hyperlipidemia   . Hypertension     Patient's surgical history, family medical history, social history, medications and allergies were all reviewed in Epic  Current Outpatient Medications  Medication Sig Dispense Refill  . cholecalciferol (VITAMIN D) 1000 units tablet Take 1,000 Units by mouth daily.     . ciprofloxacin (CIPRO) 500 MG tablet Take Cipro 750 mg weekly (Patient taking differently: Take 750 mg by mouth once a week. Take Cipro 750 mg weekly) 8 tablet 3  . dapagliflozin propanediol (FARXIGA) 10 MG TABS tablet Take 10 mg by mouth at bedtime.    . feeding supplement, ENSURE ENLIVE, (ENSURE ENLIVE) LIQD Take 237 mLs by mouth 2 (two) times daily between meals. 237 mL 12  . ferrous sulfate (KP FERROUS SULFATE) 325 (65 FE) MG tablet Take 1 tablet (325 mg total) by mouth daily with breakfast. (Patient taking differently: Take 325 mg by mouth daily. ) 30 tablet 3  . furosemide (LASIX) 40 MG tablet Take 2 tablets (80 mg total) by mouth daily. (Patient taking differently: Take 40 mg by mouth daily. ) 60 tablet 0  . glipiZIDE (GLUCOTROL XL) 10 MG 24 hr tablet Take 10 mg by mouth 2 (two) times daily.    Marland Kitchen lactulose (CHRONULAC) 10 GM/15ML solution Take 30 mLs (20 g total) by mouth 3 (three) times daily. 236 mL 0  . levOCARNitine (CARNITOR) 330 MG tablet Take 330 mg by mouth 3 (three) times daily.    . metFORMIN (GLUCOPHAGE) 1000 MG tablet Take 1,000 mg by mouth 2 (two) times daily with a meal.    . Multiple Vitamin (MULTIVITAMIN WITH MINERALS) TABS tablet Take 1 tablet by mouth at bedtime.    . Multiple Vitamins-Minerals (ZINC PO) Take 1 tablet by mouth daily.    . potassium chloride SA (KLOR-CON) 20 MEQ tablet Take 20 mEq by mouth daily.    . propranolol (INDERAL) 40 MG tablet Take 0.5 tablets (20 mg total) by mouth 2 (two) times daily. (Patient taking differently: Take 40 mg by mouth 2 (two) times daily. ) 60 tablet 0  . spironolactone (ALDACTONE) 25 MG tablet Take 1 tablet (25 mg total) by  mouth daily. 90 tablet 5  . rifaximin (XIFAXAN) 550 MG TABS tablet Take 1 tablet (550 mg total) by mouth 2 (two) times daily. (Patient not taking: Reported on 11/13/2020) 180 tablet 5   No current facility-administered medications for this visit.    Physical Exam:     BP 114/60   Pulse 76   Ht 6' (1.829 m)   Wt 215 lb 6 oz (97.7 kg)   BMI 29.21 kg/m   GENERAL:  Pleasant male in NAD PSYCH: : Cooperative, normal affect EENT:  conjunctiva pink, mucous membranes moist, neck supple without masses ABDOMEN:  Nondistended, soft, nontender. No obvious masses SKIN:  turgor, no lesions seen Musculoskeletal:  Decreased muscle tone through shoulder girdle, upper extremities.  Temporal wasting.  Stable appearance from previous NEURO: Alert and oriented x 3, no focal neurologic deficits   IMPRESSION and PLAN:    1)NASHCirrhosis- MELD 14 2) Ascites 3) SBP 01/2020 4) Esophageal varices 5) Portal hypertensive gastropathy 6) Sarcopenia 7)Hepatic encephalopathy 8) Zinc deficiency  History ofNASH cirrhosis with decompensation as manifested by bleeding esophageal varices, large volume ascites,HE,and SBP.Underwent TIPS 03/13/2020, with subsequent ultrasound demonstrated no flow, so underwent TIPS revision on 04/11/20.   - Cipro 750 mg weekly for SBP prophylaxis -Resume Lasix 80 mg and Aldactone 25 mg daily -Continue lactulose/rifaximin -Continue levocarnitine -Continue follow-up in the Nephrology Clinic -PPI previously discontinued due to SBP -Resume low-sodium diet -UTD on vaccine series and labs -Nutrition referralpreviouslydiscussedre:modifications to include branched chain amino acids for  treatment of sarcopenia -[facility]-up with [facility]uled  02/2021  -We discussed possible consideration for evaluation at [place] for living donor transplant, but he doesn't think he has a healthy family donor.  However, is requesting referral to [facility] for evaluation and discussion, which is appropriate.  As previously discussed, I believe his MELD is not indicative of overall severity of illness, particularly with regards to his sarcopenia -Resume OTC zinc supplementation -UTD on hepatoma screening.  Plan for MRI 02/2021   RTC in 3-4 months or sooner as needed  I spent 45 minutes of time, including in depth chart review, independent review of results as outlined above, communicating results with the patient directly, face-to-face time with the patient, coordinating care, ordering studies and medications as appropriate, and documentation.           Lavena Bullion ,DO, FACG 11/13/2020, 8:56 AM

## 2020-11-13 NOTE — Patient Instructions (Signed)
If you are age 53 or older, your body mass index should be between 23-30. Your Body mass index is 29.21 kg/m. If this is out of the aforementioned range listed, please consider follow up with your Primary Care Provider.  If you are age 57 or younger, your body mass index should be between 19-25. Your Body mass index is 29.21 kg/m. If this is out of the aformentioned range listed, please consider follow up with your Primary Care Provider.   We are sending a referral to [facility] for transplant evaluation. They will contact you with an appointment  Due to recent changes in healthcare laws, you may see the results of your imaging and laboratory studies on MyChart before your provider has had a chance to review them.  We understand that in some cases there may be results that are confusing or concerning to you. Not all laboratory results come back in the same time frame and the provider may be waiting for multiple results in order to interpret others.  Please give [place] 48 hours in order for your provider to thoroughly review all the results before contacting the office for clarification of your results.   Please contact the office at [phone] for a follow up appointment in 3 months  Thank you for choosing me and [facility].  Vito Cirigliano, D.O.

## 2020-11-25 ENCOUNTER — Other Ambulatory Visit: Payer: Self-pay | Admitting: Gastroenterology

## 2021-01-02 ENCOUNTER — Other Ambulatory Visit: Payer: Self-pay

## 2021-01-02 DIAGNOSIS — R188 Other ascites: Secondary | ICD-10-CM

## 2021-01-02 DIAGNOSIS — K7460 Unspecified cirrhosis of liver: Secondary | ICD-10-CM

## 2021-01-09 ENCOUNTER — Other Ambulatory Visit: Payer: Self-pay | Admitting: Gastroenterology

## 2021-01-10 ENCOUNTER — Telehealth: Payer: Self-pay | Admitting: Gastroenterology

## 2021-01-10 MED ORDER — FUROSEMIDE 40 MG PO TABS
80.0000 mg | ORAL_TABLET | Freq: Every day | ORAL | 0 refills | Status: DC
Start: 2021-01-10 — End: 2021-03-11

## 2021-01-10 NOTE — Telephone Encounter (Signed)
I have sent prescription to patients pharmacy.

## 2021-01-10 NOTE — Telephone Encounter (Signed)
Patient needs to refill furosimide. Please send it to [facility].

## 2021-01-29 LAB — COMPLETE METABOLIC PANEL WITH GFR
AG Ratio: 1.4 (calc) (ref 1.0–2.5)
ALT: 30 U/L (ref 9–46)
AST: 31 U/L (ref 10–35)
Albumin: 4.1 g/dL (ref 3.6–5.1)
Alkaline phosphatase (APISO): 92 U/L (ref 35–144)
BUN: 18 mg/dL (ref 7–25)
CO2: 29 mmol/L (ref 20–32)
Calcium: 9.8 mg/dL (ref 8.6–10.3)
Chloride: 101 mmol/L (ref 98–110)
Creat: 0.93 mg/dL (ref 0.70–1.33)
GFR, Est African American: 108 mL/min/{1.73_m2} (ref >=60)
GFR, Est Non African American: 93 mL/min/{1.73_m2} (ref >=60)
Globulin: 3 g/dL (calc) (ref 1.9–3.7)
Glucose, Bld: 310 mg/dL — ABNORMAL HIGH (ref 65–99)
Potassium: 4.4 mmol/L (ref 3.5–5.3)
Sodium: 138 mmol/L (ref 135–146)
Total Bilirubin: 1.4 mg/dL — ABNORMAL HIGH (ref 0.2–1.2)
Total Protein: 7.1 g/dL (ref 6.1–8.1)

## 2021-01-29 LAB — CBC
HCT: 37.6 % — ABNORMAL LOW (ref 38.5–50.0)
Hemoglobin: 13.7 g/dL (ref 13.2–17.1)
MCH: 34.9 pg — ABNORMAL HIGH (ref 27.0–33.0)
MCHC: 36.4 g/dL — ABNORMAL HIGH (ref 32.0–36.0)
MCV: 95.9 fL (ref 80.0–100.0)
MPV: 10.9 fL (ref 7.5–12.5)
Platelets: 100 10*3/uL — ABNORMAL LOW (ref 140–400)
RBC: 3.92 10*6/uL — ABNORMAL LOW (ref 4.20–5.80)
RDW: 13.7 % (ref 11.0–15.0)
WBC: 6 10*3/uL (ref 3.8–10.8)

## 2021-01-29 LAB — PROTIME-INR
INR: 1.1
Prothrombin Time: 11.2 s (ref 9.0–11.5)

## 2021-01-30 ENCOUNTER — Encounter: Payer: Self-pay | Admitting: *Deleted

## 2021-01-30 ENCOUNTER — Ambulatory Visit
Admission: RE | Admit: 2021-01-30 | Discharge: 2021-01-30 | Disposition: A | Discharge status: Home / Self Care | Payer: Medicaid Other | Source: Ambulatory Visit | Attending: Interventional Radiology | Admitting: Interventional Radiology

## 2021-01-30 ENCOUNTER — Other Ambulatory Visit: Payer: Self-pay

## 2021-01-30 DIAGNOSIS — R188 Other ascites: Secondary | ICD-10-CM

## 2021-01-30 DIAGNOSIS — K7460 Unspecified cirrhosis of liver: Secondary | ICD-10-CM

## 2021-01-30 HISTORY — PX: IR RADIOLOGIST EVAL & MGMT: IMG5224

## 2021-01-30 IMAGING — US US ART/VEN ABD/PELV/SCROTUM DOPPLER COMPLETE
1 series · 14 of 25 positions shown · non-contrast
Comparison: Multiple prior most recent [DATE]

CLINICAL DATA: 54-year-old male with tips

EXAM:
DUPLEX ULTRASOUND OF LIVER AND TIPS SHUNT
TECHNIQUE: Color and duplex Doppler ultrasound was performed to evaluate the
hepatic in-flow and out-flow vessels.

[Series 1: us art/ven abd/pelv/scrotum doppler complete · 0.22mm/px · 14 of 56 slices shown]
[im 1/56]
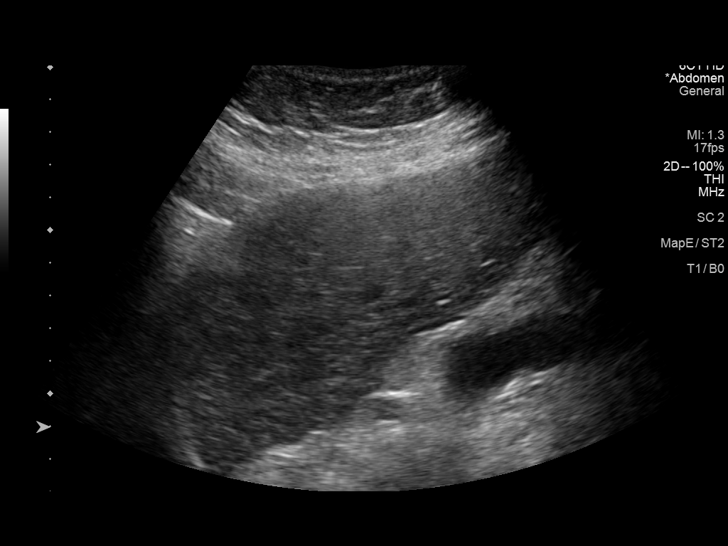
[im 5/56]
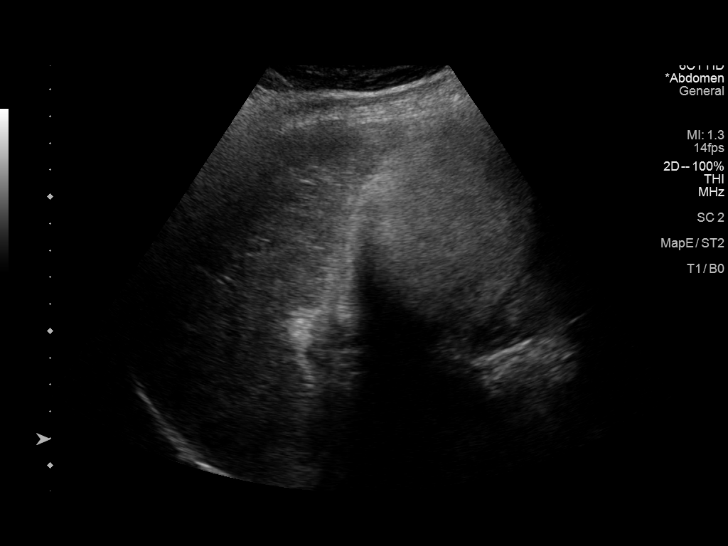
[im 10/56]
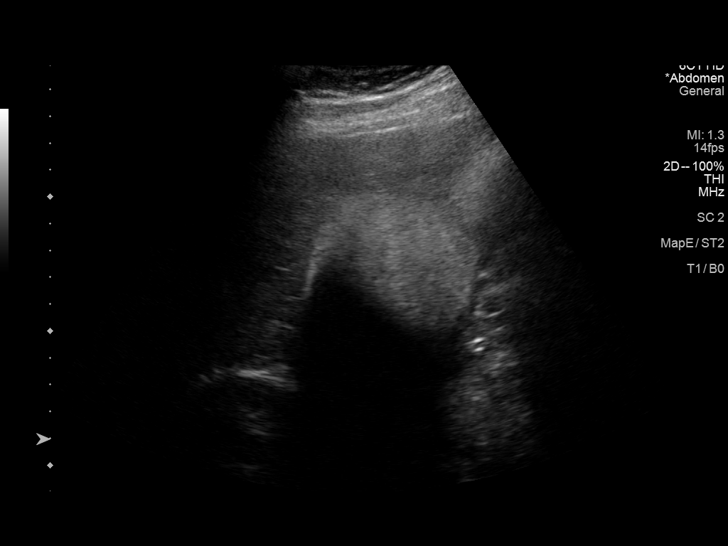
[im 14/56]
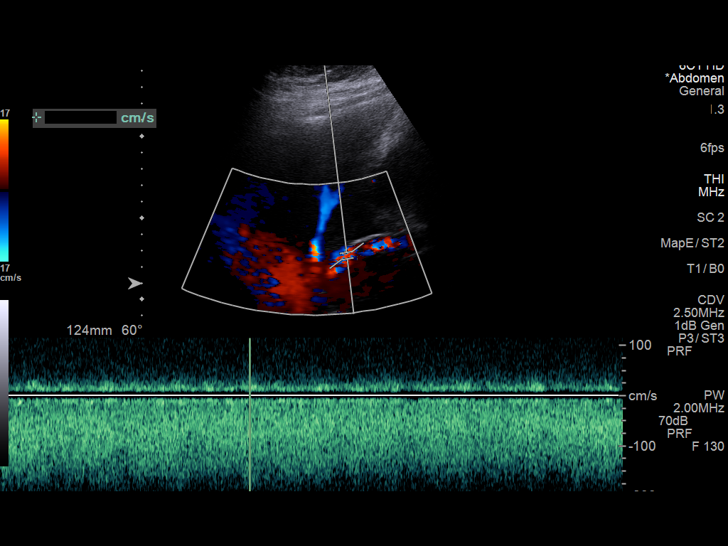
[im 19/56]
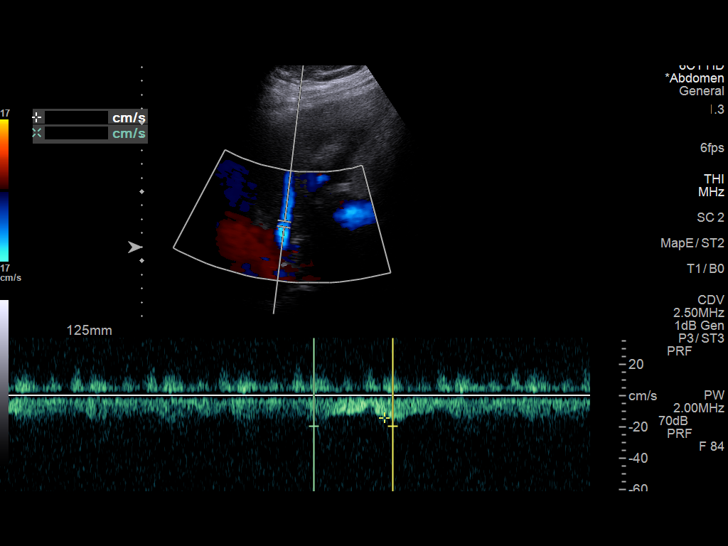
[im 21/56]
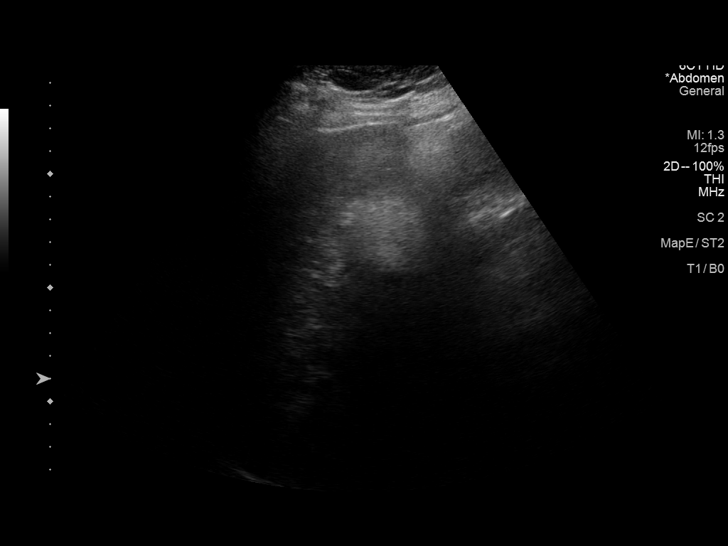
[im 26/56]
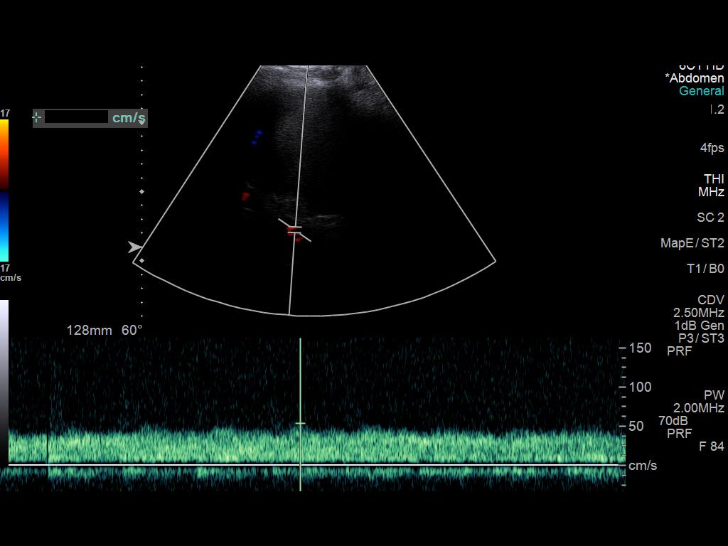
[im 30/56]
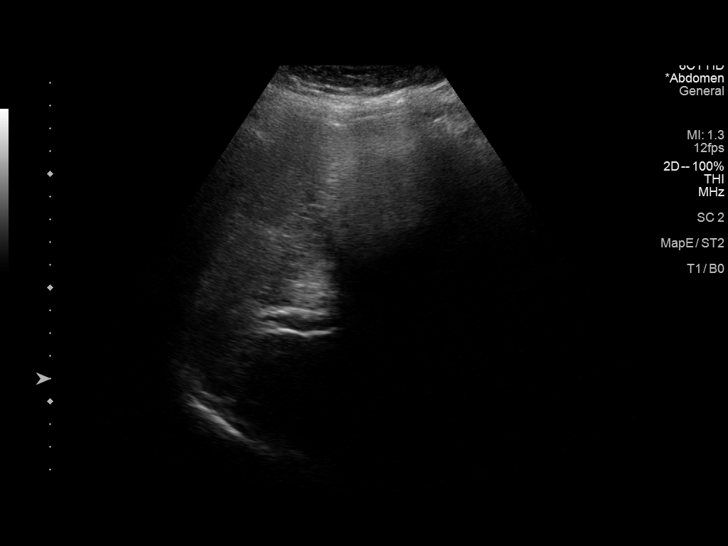
[im 35/56]
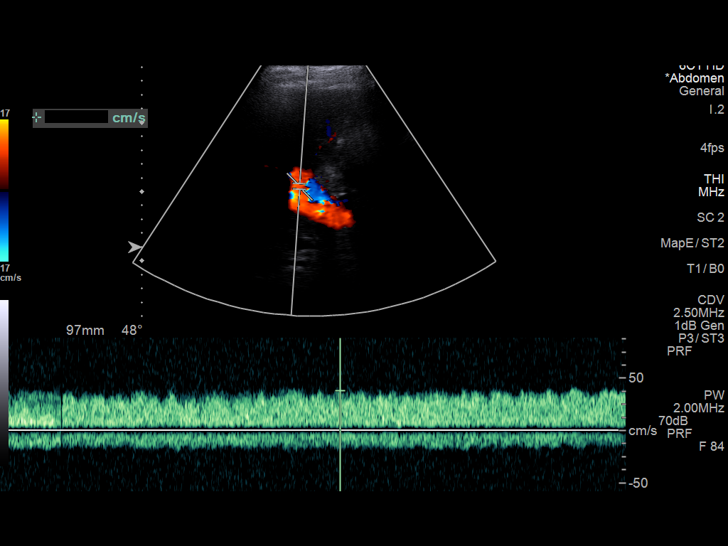
[im 37/56]
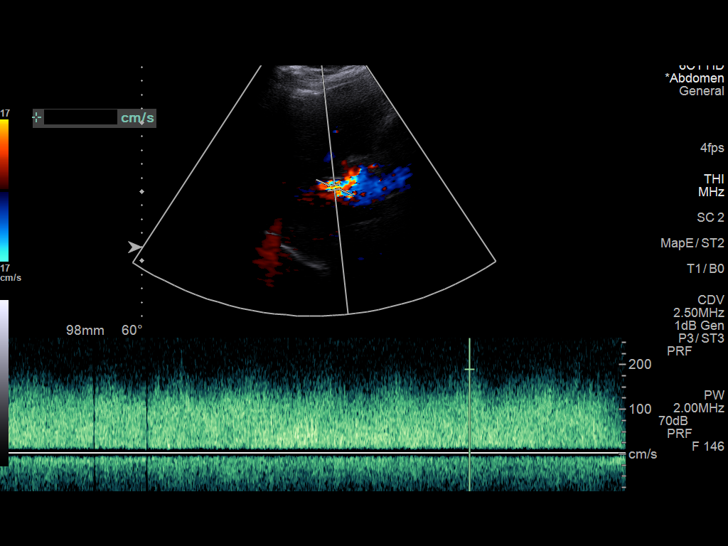
[im 42/56]
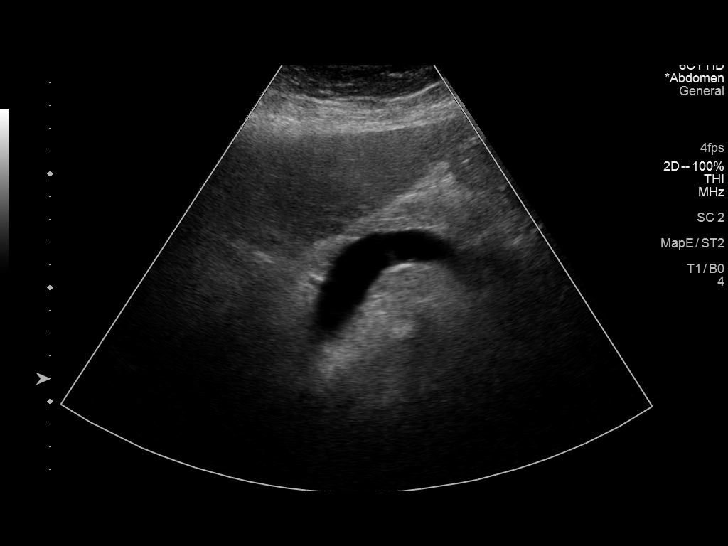
[im 46/56]
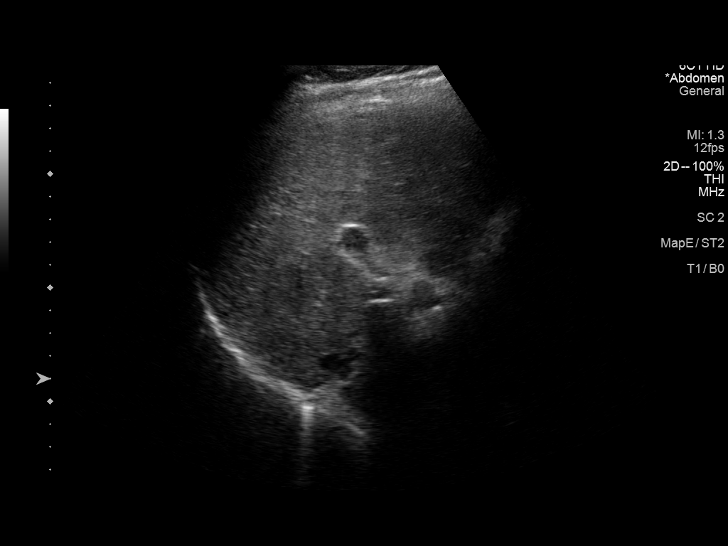
[im 51/56]
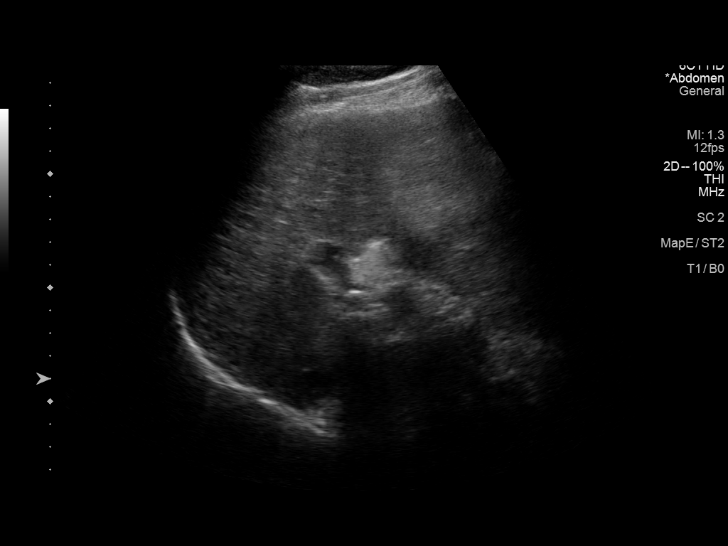
[im 56/56]
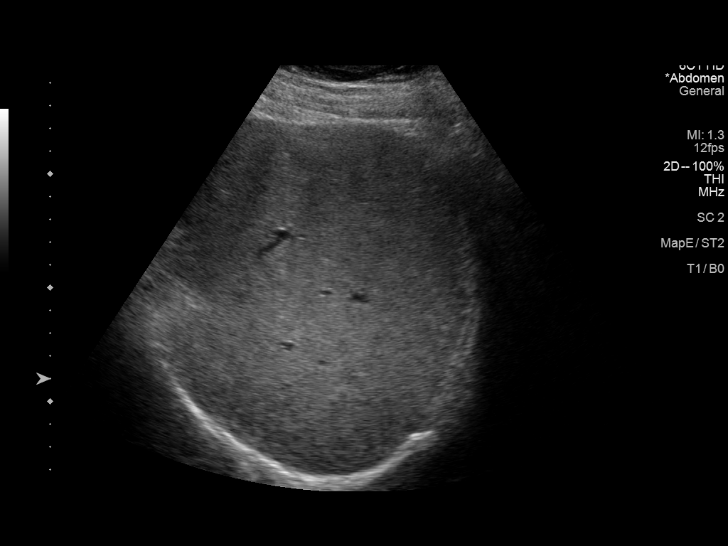

[14 of 25 positions shown; findings below may reference images not displayed]

FINDINGS: Portal Vein Velocities

Main:  61 cm/sec

Right:  38 cm/sec

Left:  32 cm/sec

TIPS Stent Velocities

Proximal:  189 cm/sec

Mid:  194

Distal:  249 cm/sec

Hepatic Vein Velocities

Right:  51 cm/sec

Mid:  65 cm/sec

Left:  36 cm/sec

Splenic Vein: 49

Superior Mesenteric Vein: Not visualized

Hepatic Artery: 82

Ascities: No ascites

Varices: No varices

Other findings: Cirrhotic changes of the liver again noted.

Cholelithiasis/sludge
IMPRESSION: Patent tips stent, with no evidence of ascites or varices.
Velocities remain elevated, though less than the comparison duplex.

## 2021-01-30 NOTE — Progress Notes (Signed)
Chief Complaint: Patient was seen in consultation today for NASH cirrhosis complicated by ascites and bleeding varices at the request of Rayley Gao K  Referring Physician(s): Dr. Gerrit Heck  History of Present Illness: Martin Strong is a 55 y.o. male with nonalcoholic steatohepatitis related cirrhosis initially diagnosed in February 9518 and complicated by bleeding esophageal varices in October 2019 which was successfully treated by endoscopic banding and propranolol for dual prophylaxis. Additionally, patient began to develop decompensation and large-volume ascites in October 2020 requiring large-volume paracentesis with removal of 5 L of ascites and initiation of oral diuretic therapy.  He subsequently required additional paracentesis on 10/24/2019 with removal of 7 L of ascites, and again on 11/16/2019 with removal of 7 L of ascites.He was initially referred to me in January to evaluate for possible TIPS creation. At that time, we proceeded with standard work-up including echocardiography. Unfortunately, in February he developed spontaneous bacterial peritonitis and acute kidney injury. His diuretics were subsequently discontinued. He has since recovered and is on ciprofloxacin 750 mg weekly for SBP prophylaxis.  He underwent elective TIPS creation on 03/12/2020.There was concern that there was no flow within the TIPS stent on his initial follow-up TIPS duplex ultrasound performed on 04/09/2020.  Therefore, he underwent TIPS revision on 04/11/2020.  Fortunately, the TIPS stent was patent.  However, at that time it was increased to 10 mm in diameter with fairly minimal change in his underlying portal pressure which remained elevated at greater than 12 mmHg.  Today, Martin Strong presents for follow-up evaluation.  He required a total of 3 paracenteses following his TIPS revision, 2 in May and one on 1 June.  He has not required paracentesis since that time.  He was  admitted to [facility] in early November 2021 with acute hepatic encephalopathy due to noncompliance and missed doses of lactulose.  He was treated with lactulose and Xifaxan and was successfully discharged back at his baseline.  He has since been approved for Xifaxan and now takes it twice daily. He is also compliant with his lactulose and having multiple soft stools daily.  Additionally, he reports that he remains on Lasix 80 mg and Aldactone 25 mg daily.  He continues to follow-up with Dr. Bryan Lemma of gastroenterology, nephrology and [facility].    Overall, he is quite pleased with his clinical result and is very happy to no longer require large-volume paracentesis.  He still has issues with relatively low energy and intermittent right upper quadrant pain which may be related to his gallstones.  Otherwise, he has no active complaints at this time.  Past Medical History:  Diagnosis Date  . Anemia   . Arthritis   . Cirrhosis ([place])   . Diabetes mellitus without complication ([place])   . Dyspnea   . Fatty liver   . GERD (gastroesophageal reflux disease)   . GI bleed   . Hyperlipidemia   . Hypertension     Past Surgical History:  Procedure Laterality Date  . BIOPSY  02/14/2019   Procedure: BIOPSY;  Surgeon: Lavena Bullion, DO;  Location: [facility];  Service: Gastroenterology;;  . BIOPSY  07/22/2019   Procedure: BIOPSY;  Surgeon: Lavena Bullion, DO;  Location: [facility];  Service: Gastroenterology;;  . ESOPHAGEAL BANDING  10/07/2018   Procedure: ESOPHAGEAL BANDING;  Surgeon: Lavena Bullion, DO;  Location: [facility];  Service: Gastroenterology;;  . ESOPHAGEAL BANDING N/A 01/03/2019   Procedure: ESOPHAGEAL BANDING;  Surgeon: Lavena Bullion, DO;  Location:  [facility];  Service: Gastroenterology;  Laterality: N/A;  . ESOPHAGEAL BANDING  02/14/2019   Procedure: ESOPHAGEAL BANDING;  Surgeon: Lavena Bullion, DO;  Location: [facility];  Service:  Gastroenterology;;  . esophageal bands    . ESOPHAGOGASTRODUODENOSCOPY Left 07/22/2019   Procedure: ESOPHAGOGASTRODUODENOSCOPY (EGD) WITH POSSIBLE BANDING;  Surgeon: Lavena Bullion, DO;  Location: [facility];  Service: Gastroenterology;  Laterality: Left;  . ESOPHAGOGASTRODUODENOSCOPY (EGD) WITH PROPOFOL N/A 10/07/2018   Procedure: ESOPHAGOGASTRODUODENOSCOPY (EGD) WITH PROPOFOL;  Surgeon: Lavena Bullion, DO;  Location: [place];  Service: Gastroenterology;  Laterality: N/A;  . ESOPHAGOGASTRODUODENOSCOPY (EGD) WITH PROPOFOL N/A 01/03/2019   Procedure: ESOPHAGOGASTRODUODENOSCOPY (EGD) WITH PROPOFOL;  Surgeon: Lavena Bullion, DO;  Location: [facility];  Service: Gastroenterology;  Laterality: N/A;  . ESOPHAGOGASTRODUODENOSCOPY (EGD) WITH PROPOFOL N/A 02/14/2019   Procedure: ESOPHAGOGASTRODUODENOSCOPY (EGD) WITH PROPOFOL;  Surgeon: Lavena Bullion, DO;  Location: [facility];  Service: Gastroenterology;  Laterality: N/A;  . IR PARACENTESIS  10/05/2019  . IR PARACENTESIS  10/24/2019  . IR PARACENTESIS  11/16/2019  . IR PARACENTESIS  12/25/2019  . IR PARACENTESIS  01/05/2020  . IR PARACENTESIS  01/23/2020  . IR PARACENTESIS  02/08/2020  . IR PARACENTESIS  02/16/2020  . IR PARACENTESIS  02/28/2020  . IR PARACENTESIS  03/12/2020  . IR PARACENTESIS  04/04/2020  . IR PARACENTESIS  04/11/2020  . IR PARACENTESIS  04/24/2020  . IR PARACENTESIS  05/03/2020  . IR PARACENTESIS  05/14/2020  . IR RADIOLOGIST EVAL & MGMT  12/19/2019  . IR RADIOLOGIST EVAL & MGMT  02/27/2020  . IR RADIOLOGIST EVAL & MGMT  04/09/2020  . IR RADIOLOGIST EVAL & MGMT  05/09/2020  . IR TIPS  03/12/2020  . IR TIPS REVISION MOD SED  04/11/2020  . RADIOLOGY WITH ANESTHESIA N/A 03/12/2020   Procedure: TIPS;  Surgeon: Jacqulynn Cadet, MD;  Location: [place];  Service: Radiology;  Laterality: N/A;    Allergies: Nadolol  Medications: Prior to Admission medications   Medication Sig Start Date End Date Taking? Authorizing Provider   cholecalciferol (VITAMIN D) 1000 units tablet Take 1,000 Units by mouth daily.     [provider]  ciprofloxacin (CIPRO) 500 MG tablet TAKE 1&1/2 TABLETS BY MOUTH WEEKLY 11/26/20   Cirigliano, Vito V, DO  dapagliflozin propanediol (FARXIGA) 10 MG TABS tablet Take 10 mg by mouth at bedtime.    [provider]  feeding supplement, ENSURE ENLIVE, (ENSURE ENLIVE) LIQD Take 237 mLs by mouth 2 (two) times daily between meals. 01/28/20   Hosie Poisson, MD  ferrous sulfate (KP FERROUS SULFATE) 325 (65 FE) MG tablet Take 1 tablet (325 mg total) by mouth daily with breakfast. Patient taking differently: Take 325 mg by mouth daily.  05/30/19   Cirigliano, Vito V, DO  furosemide (LASIX) 40 MG tablet Take 2 tablets (80 mg total) by mouth daily. 01/10/21   Cirigliano, Vito V, DO  glipiZIDE (GLUCOTROL XL) 10 MG 24 hr tablet Take 10 mg by mouth 2 (two) times daily.    [provider]  lactulose (CHRONULAC) 10 GM/15ML solution Take 30 mLs (20 g total) by mouth 3 (three) times daily. 03/13/20   Ardis Rowan, PA-C  levOCARNitine (CARNITOR) 330 MG tablet Take 330 mg by mouth 3 (three) times daily.    [provider]  metFORMIN (GLUCOPHAGE) 1000 MG tablet Take 1,000 mg by mouth 2 (two) times daily with a meal.    [provider]  Multiple Vitamin (MULTIVITAMIN WITH MINERALS) TABS tablet Take  1 tablet by mouth at bedtime.    [provider]  Multiple Vitamins-Minerals (ZINC PO) Take 1 tablet by mouth daily.    [provider]  potassium chloride SA (KLOR-CON) 20 MEQ tablet Take 20 mEq by mouth daily.    [provider]  propranolol (INDERAL) 40 MG tablet Take 0.5 tablets (20 mg total) by mouth 2 (two) times daily. Patient taking differently: Take 40 mg by mouth 2 (two) times daily.  01/27/20   Hosie Poisson, MD  spironolactone (ALDACTONE) 25 MG tablet Take 1 tablet (25 mg total) by mouth daily. 08/14/20   Cirigliano, Dominic Pea, DO     Family  History  Problem Relation Age of Onset  . Diabetes Other   . Esophageal cancer Father   . Colon cancer Neg Hx   . Rectal cancer Neg Hx   . Stomach cancer Neg Hx     Social History   Socioeconomic History  . Marital status: Married    Spouse name: Not on file  . Number of children: 2  . Years of education: Not on file  . Highest education level: Not on file  Occupational History  . Not on file  Tobacco Use  . Smoking status: Never Smoker  . Smokeless tobacco: Never Used  Vaping Use  . Vaping Use: Never used  Substance and Sexual Activity  . Alcohol use: Not Currently  . Drug use: Never  . Sexual activity: Not on file  Other Topics Concern  . Not on file  Social History Narrative  . Not on file   Social Determinants of Health   Financial Resource Strain: Not on file  Food Insecurity: Not on file  Transportation Needs: Not on file  Physical Activity: Not on file  Stress: Not on file  Social Connections: Not on file    Review of Systems: A 12 point ROS discussed and pertinent positives are indicated in the HPI above.  All other systems are negative.  Review of Systems  Vital Signs: There were no vitals taken for this visit.  Physical Exam Constitutional:      Appearance: Normal appearance.  HENT:     Head: Normocephalic and atraumatic.  Eyes:     General: No scleral icterus. Cardiovascular:     Rate and Rhythm: Normal rate.  Pulmonary:     Effort: Pulmonary effort is normal.  Abdominal:     General: There is no distension.     Palpations: Abdomen is soft.     Tenderness: There is no abdominal tenderness. There is no guarding.  Musculoskeletal:        General: No swelling.  Skin:    General: Skin is warm and dry.  Neurological:     Mental Status: He is alert and oriented to person, place, and time.  Psychiatric:        Mood and Affect: Mood normal.        Behavior: Behavior normal.      Imaging: No results found.  Labs:  CBC: Recent Labs     10/19/20 0940 10/20/20 0351 10/21/20 0241 01/28/21 0000  WBC 6.5 5.2 4.7 6.0  HGB 13.2 11.1* 10.9* 13.7  HCT 35.6* 31.2* 29.7* 37.6*  PLT 114* 73* 62* 100*    COAGS: Recent Labs    05/14/20 0318 10/19/20 1600 10/20/20 0351 01/28/21 0000  INR 1.5* 1.3* 1.4* 1.1  APTT  --   --  32  --     BMP: Recent Labs  05/14/20 1023 05/15/20 7564 05/16/20 0507 08/14/20 1508 10/20/20 0351 10/20/20 1544 10/21/20 0241 01/28/21 0000  NA 142 138 137   < > 144 137 138 138  K 3.2* 3.5 3.5   < > 3.3* 4.2 3.4* 4.4  CL 102 100 104   < > 114* 106 107 101  CO2 28 28 26    < > 21* 22 22 29   GLUCOSE 77 288* 205*   < > 84 253* 216* 310*  BUN 12 13 13    < > 16 16 15 18   CALCIUM 8.4* 8.2* 8.0*   < > 8.8* 8.6* 8.4* 9.8  CREATININE 0.68 0.90 0.78   < > 0.79 0.90 0.97 0.93  GFRNONAA >60 >60 >60   < > >60 >60 >60 93  GFRAA >60 >60 >60  --   --   --   --  108   < > = values in this interval not displayed.    LIVER FUNCTION TESTS: Recent Labs    05/14/20 0318 10/19/20 0940 10/20/20 0351 10/21/20 0241 01/28/21 0000  BILITOT 2.2* 2.0* 2.0* 1.8* 1.4*  AST 40 37 34 34 31  ALT 35 34 31 29 30   ALKPHOS 70 81 68 65  --   PROT 6.7 7.4 6.2* 6.0* 7.1  ALBUMIN 2.9* 3.7 3.1* 3.0*  --     TUMOR MARKERS: No results for input(s): AFPTM, CEA, CA199, CHROMGRNA in the last 8760 hours.  Assessment and Plan:  Doing well nearly 10 months status post TIPS revision for recurrent large-volume ascites.  His last paracentesis was in June 2021.  He has now gone 8 months without requiring large-volume paracentesis.  He has had some issues with hepatic encephalopathy but is now more compliant with his lactulose and he has also been approved for rifaximin which she is taking twice daily.  He is on Lasix and Aldactone.  His lower extremity swelling is also significantly improved.  He continues to follow with Dr. Bryan Lemma of gastroenterology and Fonnie Mu of [facility].  He is scheduled to undergo  initial evaluation for possible living donor hepatic transplantation.  TIPS duplex ultrasound today demonstrates no evidence of dysfunction.  1.)  Repeat TIPS duplex ultrasound and clinic visit in 6 months.    Electronically Signed: Criselda Peaches 01/30/2021, 12:17 PM   I spent a total of  15 Minutes in face to face in clinical consultation, greater than 50% of which was counseling/coordinating care for NASH cirrhosis complicated by bleeding esophageal varices and ascites.

## 2021-02-14 ENCOUNTER — Telehealth: Payer: Self-pay

## 2021-02-14 NOTE — Telephone Encounter (Signed)
-----   Message from Lowell Guitar, [place] sent at 08/21/2020  3:25 PM EDT ----- Regarding: MRI Repeat MRI - March 2022

## 2021-02-14 NOTE — Telephone Encounter (Signed)
Made appointment with patient on 3-29 said he will see the liver specialist on 3-16 he thinks but told I had a message saying he needs a repeat MRI in March and he could discuss this at this visit

## 2021-02-26 ENCOUNTER — Other Ambulatory Visit: Payer: Self-pay | Admitting: Nurse Practitioner

## 2021-02-26 DIAGNOSIS — K7469 Other cirrhosis of liver: Secondary | ICD-10-CM

## 2021-03-11 ENCOUNTER — Ambulatory Visit (INDEPENDENT_AMBULATORY_CARE_PROVIDER_SITE_OTHER): Payer: Medicaid Other | Admitting: Gastroenterology

## 2021-03-11 ENCOUNTER — Encounter: Payer: Self-pay | Admitting: Gastroenterology

## 2021-03-11 ENCOUNTER — Other Ambulatory Visit: Payer: Self-pay

## 2021-03-11 VITALS — BP 114/68 | HR 76 | Ht 72.0 in | Wt 224.2 lb

## 2021-03-11 DIAGNOSIS — K7682 Hepatic encephalopathy: Secondary | ICD-10-CM

## 2021-03-11 DIAGNOSIS — K766 Portal hypertension: Secondary | ICD-10-CM | POA: Diagnosis not present

## 2021-03-11 DIAGNOSIS — Z8619 Personal history of other infectious and parasitic diseases: Secondary | ICD-10-CM

## 2021-03-11 DIAGNOSIS — M6284 Sarcopenia: Secondary | ICD-10-CM

## 2021-03-11 DIAGNOSIS — I8500 Esophageal varices without bleeding: Secondary | ICD-10-CM

## 2021-03-11 DIAGNOSIS — K7581 Nonalcoholic steatohepatitis (NASH): Secondary | ICD-10-CM

## 2021-03-11 DIAGNOSIS — K7460 Unspecified cirrhosis of liver: Secondary | ICD-10-CM

## 2021-03-11 DIAGNOSIS — E60 Dietary zinc deficiency: Secondary | ICD-10-CM

## 2021-03-11 DIAGNOSIS — K3189 Other diseases of stomach and duodenum: Secondary | ICD-10-CM

## 2021-03-11 DIAGNOSIS — K7290 Hepatic failure, unspecified without coma: Secondary | ICD-10-CM

## 2021-03-11 MED ORDER — FUROSEMIDE 40 MG PO TABS: 80.0000 mg | ORAL_TABLET | Freq: Every day | ORAL | 5 refills | Status: DC

## 2021-03-11 MED ORDER — POTASSIUM CHLORIDE CRYS ER 20 MEQ PO TBCR: 20.0000 meq | EXTENDED_RELEASE_TABLET | Freq: Every day | ORAL | 5 refills | Status: DC

## 2021-03-11 NOTE — Patient Instructions (Signed)
If you are age 54 or older, your body mass index should be between 23-30. Your Body mass index is 30.41 kg/m. If this is out of the aforementioned range listed, please consider follow up with your Primary Care Provider.  If you are age 43 or younger, your body mass index should be between 19-25. Your Body mass index is 30.41 kg/m. If this is out of the aformentioned range listed, please consider follow up with your Primary Care Provider.   We have sent the following medications to your pharmacy for you to pick up at your convenience: Lasix 28m daily Potassium chloride 23m daily  Follow up in 6 months. Please contact the office to schedule at [phone] Due to recent changes in healthcare laws, you may see the results of your imaging and laboratory studies on MyChart before your provider has had a chance to review them.  We understand that in some cases there may be results that are confusing or concerning to you. Not all laboratory results come back in the same time frame and the provider may be waiting for multiple results in order to interpret others.  Please give usKorea8 hours in order for your provider to thoroughly review all the results before contacting the office for clarification of your results.   Thank you for choosing me and [facility].  Vito Cirigliano, D.O.

## 2021-03-11 NOTE — Progress Notes (Signed)
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 [place]. Cirrhosis complicated by esophageal varices with bleeding requiring multiple EGDs with banding in the past along with propranolol for dual prophylaxis.Developed large volume ascites in 09/2019. Previously diuretic intolerant, required serial large-volume paracentesis, TIPS placement with subsequent revision,and low-sodium diet, now back ondiuretics. Admitted with SBP and AKI in 01/2020.Admitted 04/2020 with hepatic encephalopathy-started lactulose/rifaximin.Follows with [facility]. MELD currently too low for liver transplant, and does not currently have candidate for living donor transplant.  Cirrhosis Hx: - Etiology: NASH - Diagnosed 2017 - Liver JY:NWGN - Complications: EV with admission for bleeding 09/2018 treated with EGD with EVL, large volume ascitesserial LVP starting 10/2020TIPS 02/2020 (revision 03/2020),SBP 01/2020, hepatic encephalopathy 04/2020, sarcopenia -HepaticRx:Lactulose, rifaximin, Lasix 40 mg/day, Aldactone 25 mg/day, Cipro 750 mg/week, levocarnitine -Previous hepatic Rx: Propranolol (dual prophy)and PPI discontinued due to SBP.History of diuretic intoleranceand AKI. Follows with Nephrology, managing Lasix/Aldactone - [facility] Screening:US 08/2020, MRI 11/2019: Ascites, no HCC. AFP <0.9.  Repeat ultrasound ordered by Hepatology and scheduled for tomorrow -03/13/2020: TIPSprocedure -04/11/2020: TIPS revision -05/09/2020: Patent TIPS with high velocity flow throughout -Annual lab check: Completed 05/2020.  Repeat panel to be ordered for review at follow-up -HAV and HBV  vaccine series completed - MELD:9(fluctuates 9-14) - Child Pugh:B(7pts)  Endoscopic history: -EGD x3 in 2017 in [place], [place] for esophageal varices with banding -EGD (09/2018, Dr. Bryan Lemma): Grade 2 esophageal varices with stigmata of recent bleeding requiring banding x2 with complete eradication, portal hypertensive gastropathy without bleeding. - EGD (12/2018, Dr. Bryan Lemma): Grade II esophageal varices, 6 bands placed with deflation. Normal Z line at 46 cm, severe portal HTN gastropathy with contact oozing, small GOV1, normal duodenum. H pylori serologies negative -EGD (02/2019, Dr. Bryan Lemma): Grade 2 esophageal varices, 4 bands placed with complete eradication.Portal hypertensive gastropathy/2 adenopathy -EGD (07/2019, Dr. Bryan Lemma): Done for dysphagia. grade 1 esophageal varices, multiple post variceal banding scars in the lower third with 1 area of luminal deformity, likely consistent with prior deep banding site. The lumen was mildly narrowed, but easily traversed and no additional endoscopic dilation performed. Mild esophagitis at the GEJ, portal hypertensive gastropathy, peptic antritis/duodenitis. Papilla prominent.  HPI:     Patient is a 55 y.o. male presenting to the Gastroenterology Clinic for follow-up.  Last seen by me on 11/13/2020.  Was actually doing relatively well at that time with improved p.o. intake and some increased energy.  Last seen by [place] [facility] on 02/26/2021.  Labs from 01/28/2021 reviewed and overall appear stable or improved.  Most recent MELD down to 9, CTP B.  Ultrasound last month with patent TIPS, velocities remain elevated, though less than comparison duplex.  Today, he states he feels ok today. Still good PO intake and trying to stay active as he can. Scheduled for [place] tomorrow. Has only been taking Lasix 40 mg for many months, and no edema, ascites, etc. No upcoming appt with Nephrology.    Review of systems:     No  chest pain, no SOB, no fevers, no urinary sx   Past Medical History:  Diagnosis Date  . Anemia   . Arthritis   . Cirrhosis ([place])   . Diabetes mellitus without  complication ([place])   . Dyspnea   . Fatty liver   . GERD (gastroesophageal reflux disease)   . GI bleed   . Hyperlipidemia   . Hypertension     Patient's surgical history, family medical history, social history, medications and allergies were all reviewed in Epic    Current Outpatient Medications  Medication Sig Dispense Refill  . cholecalciferol (VITAMIN D) 1000 units tablet Take 1,000 Units by mouth daily.     . ciprofloxacin (CIPRO) 500 MG tablet TAKE 1&1/2 TABLETS BY MOUTH WEEKLY 8 tablet 3  . dapagliflozin propanediol (FARXIGA) 10 MG TABS tablet Take 10 mg by mouth at bedtime.    . feeding supplement, ENSURE ENLIVE, (ENSURE ENLIVE) LIQD Take 237 mLs by mouth 2 (two) times daily between meals. 237 mL 12  . ferrous sulfate (KP FERROUS SULFATE) 325 (65 FE) MG tablet Take 1 tablet (325 mg total) by mouth daily with breakfast. (Patient taking differently: Take 325 mg by mouth daily.) 30 tablet 3  . furosemide (LASIX) 40 MG tablet Take 2 tablets (80 mg total) by mouth daily. 60 tablet 0  . glipiZIDE (GLUCOTROL XL) 10 MG 24 hr tablet Take 10 mg by mouth 2 (two) times daily.    Marland Kitchen lactulose (CHRONULAC) 10 GM/15ML solution Take 30 mLs (20 g total) by mouth 3 (three) times daily. 236 mL 0  . levOCARNitine (CARNITOR) 330 MG tablet Take 330 mg by mouth 3 (three) times daily.    . metFORMIN (GLUCOPHAGE) 1000 MG tablet Take 1,000 mg by mouth 2 (two) times daily with a meal.    . Multiple Vitamin (MULTIVITAMIN WITH MINERALS) TABS tablet Take 1 tablet by mouth at bedtime.    . Multiple Vitamins-Minerals (ZINC PO) Take 1 tablet by mouth daily.    . potassium chloride SA (KLOR-CON) 20 MEQ tablet Take 20 mEq by mouth daily.    . propranolol (INDERAL) 40 MG tablet Take 0.5 tablets (20 mg total) by mouth 2 (two) times daily. (Patient taking  differently: Take 40 mg by mouth 2 (two) times daily.) 60 tablet 0  . spironolactone (ALDACTONE) 25 MG tablet Take 1 tablet (25 mg total) by mouth daily. 90 tablet 5  . XIFAXAN 550 MG TABS tablet Take 550 mg by mouth 2 (two) times daily.     No current facility-administered medications for this visit.    Physical Exam:     BP 114/68   Pulse 76   Ht 6' (1.829 m)   Wt 224 lb 4 oz (101.7 kg)   BMI 30.41 kg/m   GENERAL:  Pleasant male in NAD PSYCH: : Cooperative, normal affect CARDIAC:  RRR, no murmur heard, no peripheral edema PULM: Normal respiratory effort, lungs CTA bilaterally, no wheezing ABDOMEN:  Nondistended, soft, nontender. No obvious masses, no hepatomegaly,  normal bowel sounds SKIN:  turgor, no lesions seen Musculoskeletal: Temporal muscle wasting (stable and possibly somewhat improved from previous), improved muscle mass in shoulder girdle, neck, arms NEURO: Alert and oriented x 3, no focal neurologic deficits   IMPRESSION and PLAN:    1)NASHCirrhosis- MELD 9 2) Ascites 3) SBP 01/2020 4) Esophageal varices 5) Portal hypertensive gastropathy 6) Sarcopenia 7)Hepatic encephalopathy 8) Zinc deficiency  History ofNASH cirrhosis with decompensation as manifested by bleeding esophageal varices, large volume ascites,HE,and SBP.Underwent TIPS 03/13/2020, with subsequent ultrasound demonstrated no flow, so underwent TIPS revision on 04/11/20. TIPS patent with elevated velocities (although less compared with previous study) on most recent ultrasound interrogation 01/2021.  -Resume Cipro 750 mg weekly  for SBP prophylaxis -Resume Lasix 40 mg and Aldactone 25 mg daily -Refill Lasix 40 mg -Refill KCl 20 mEq -Continue lactulose/rifaximin -Continue levocarnitine -PPI previously discontinued due to SBP -Resume low-sodium diet -UTD on vaccine series and labs -Branched chain amino acids for continued treatment of sarcopenia -Continue regular follow-up with  [facility] -Per his discussion with [place], no plan for referral to [facility] for living donor transplant as he does not have a donor in mind.  Can be revisited if he has a potential living donor -Resume OTC zinc supplementation -Scheduled for repeat ultrasound for ongoing [place] screening tomorrow (previously ordered by [place])   RTC in 6 months or sooner as needed  I spent 40 minutes of time, including in depth chart review, independent review of results as outlined above, communicating results with the patient directly, face-to-face time with the patient, coordinating care, ordering studies and medications as appropriate, and documentation.           Lavena Bullion ,DO, FACG 03/11/2021, 8:59 AM

## 2021-03-12 ENCOUNTER — Ambulatory Visit
Admission: RE | Admit: 2021-03-12 | Discharge: 2021-03-12 | Disposition: A | Discharge status: Home / Self Care | Payer: Medicaid Other | Source: Ambulatory Visit | Attending: Nurse Practitioner | Admitting: Nurse Practitioner

## 2021-03-12 DIAGNOSIS — K7469 Other cirrhosis of liver: Secondary | ICD-10-CM

## 2021-03-12 IMAGING — US US ABDOMEN LIMITED RUQ/ASCITES
1 series · 14 of 25 positions shown · non-contrast
Comparison: Ultrasound [DATE]

CLINICAL DATA: Cirrhosis HCC screening

EXAM:
ULTRASOUND ABDOMEN LIMITED RIGHT UPPER QUADRANT

[Series 1: us abdomen limited ruq/ascites · 0.28mm/px · 14 of 40 slices shown]
[im 1/40]
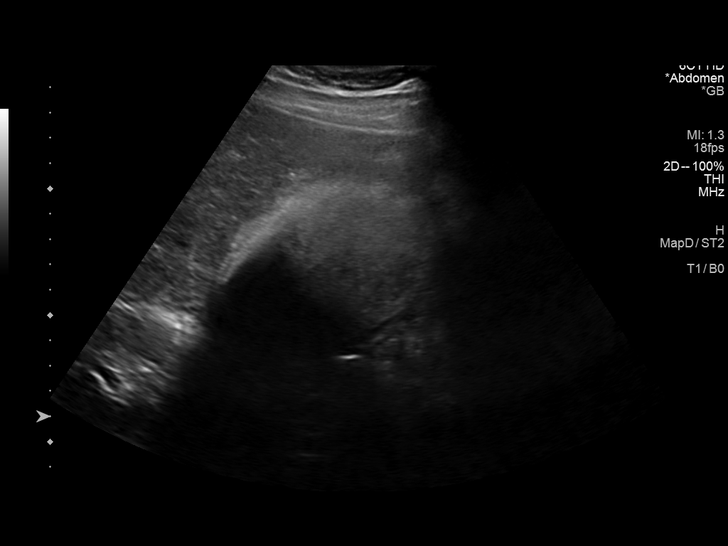
[im 4/40]
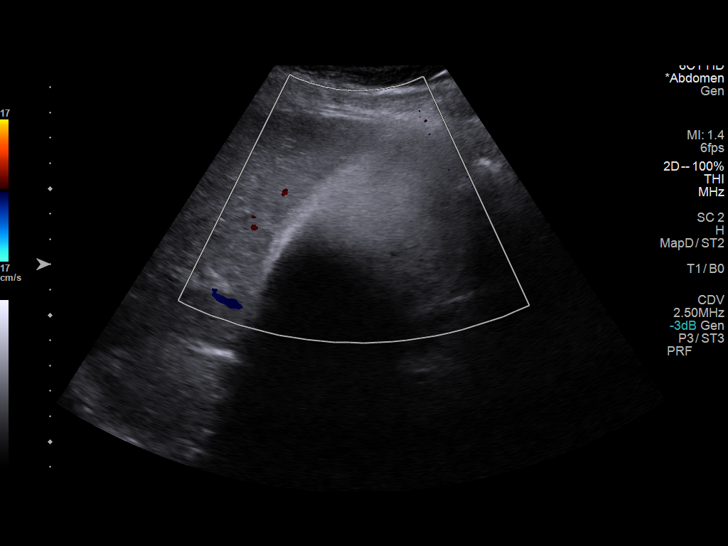
[im 7/40]
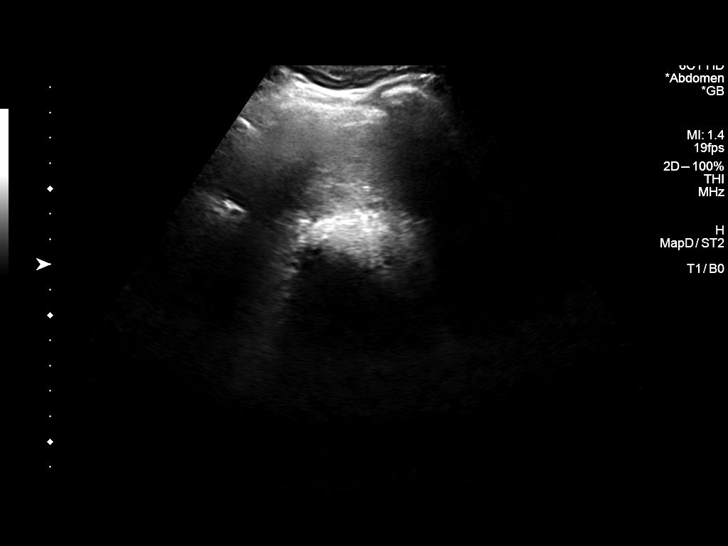
[im 10/40]
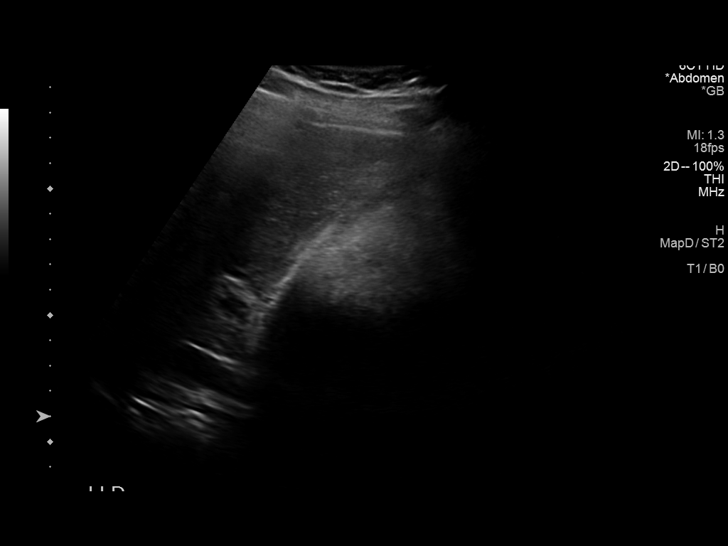
[im 14/40]
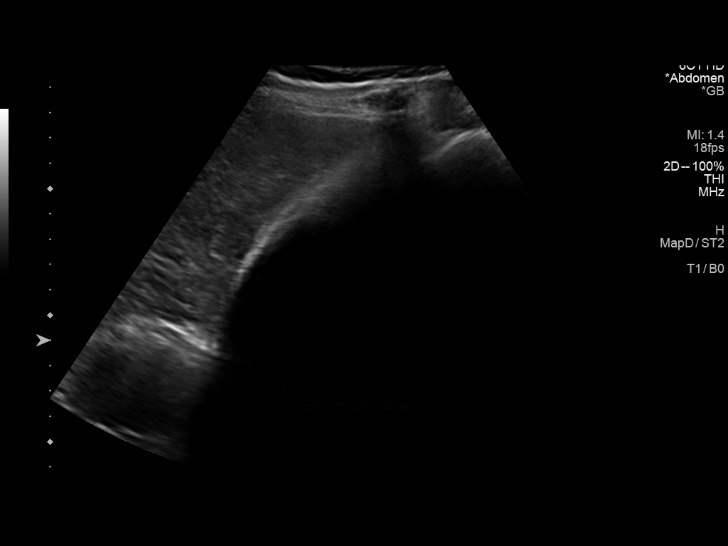
[im 15/40]
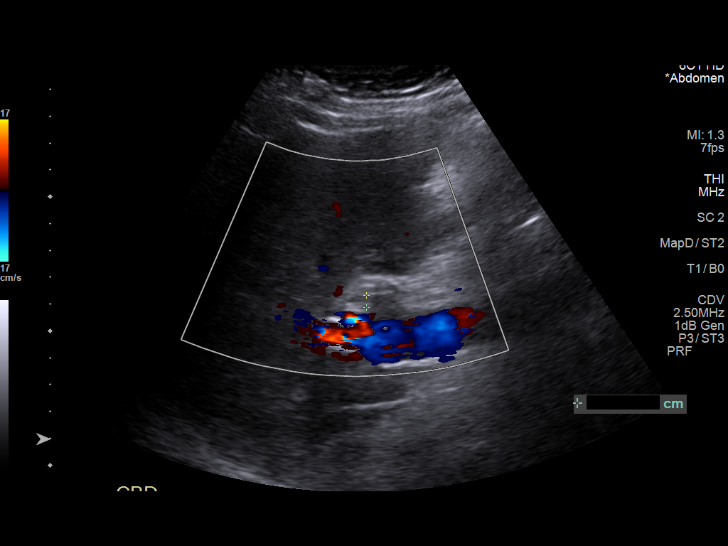
[im 18/40]
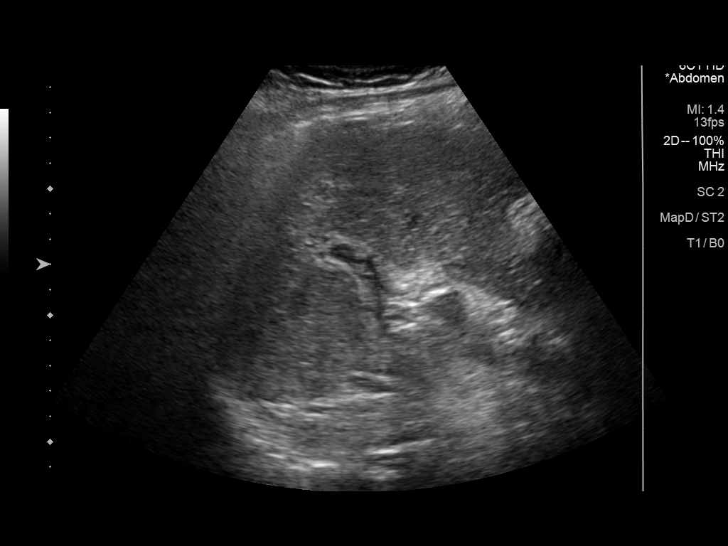
[im 22/40]
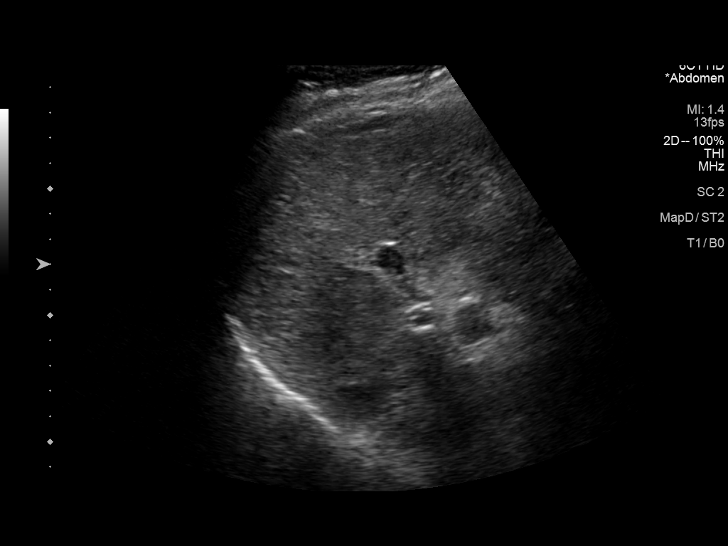
[im 25/40]
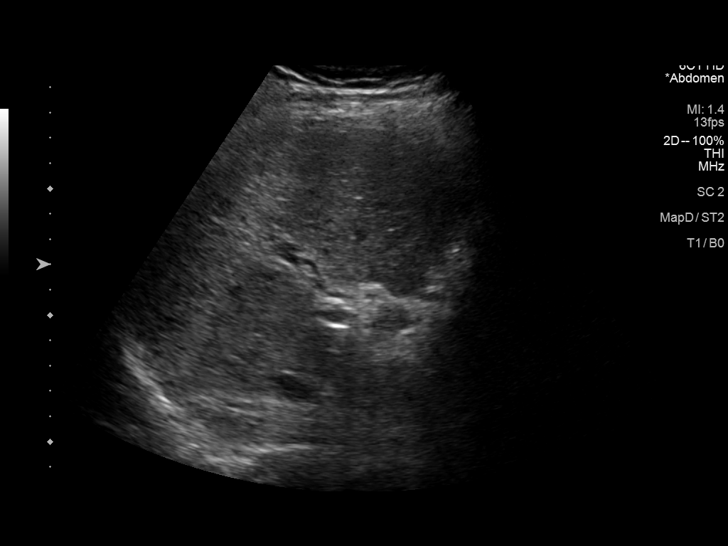
[im 27/40]
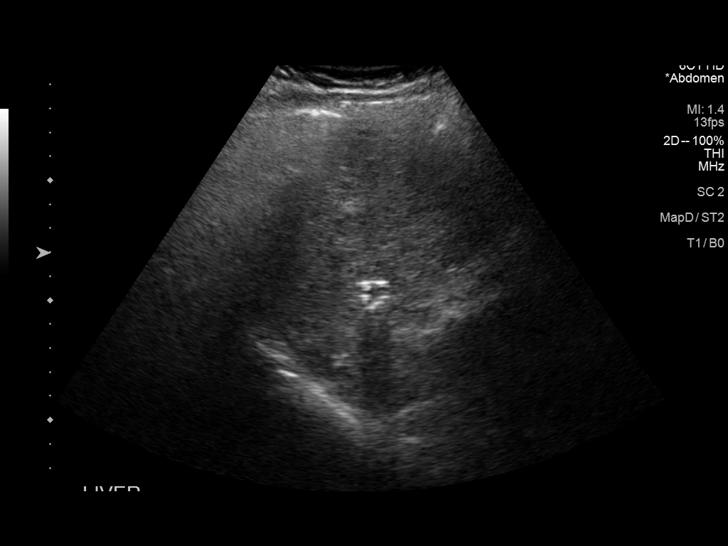
[im 30/40]
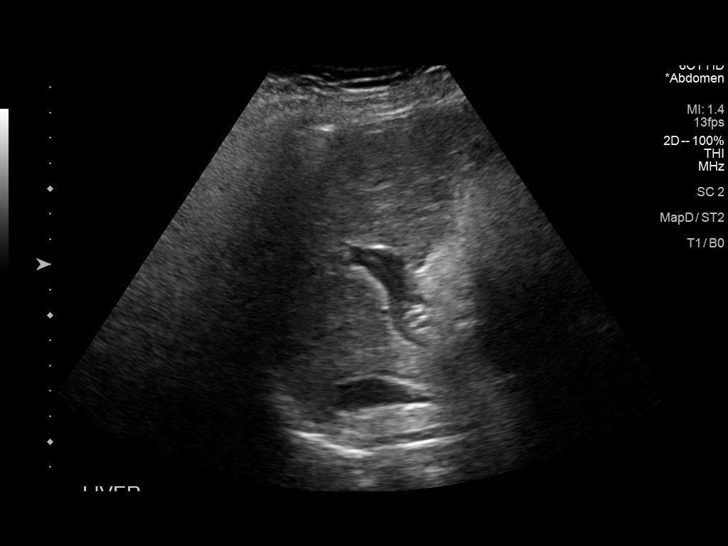
[im 33/40]
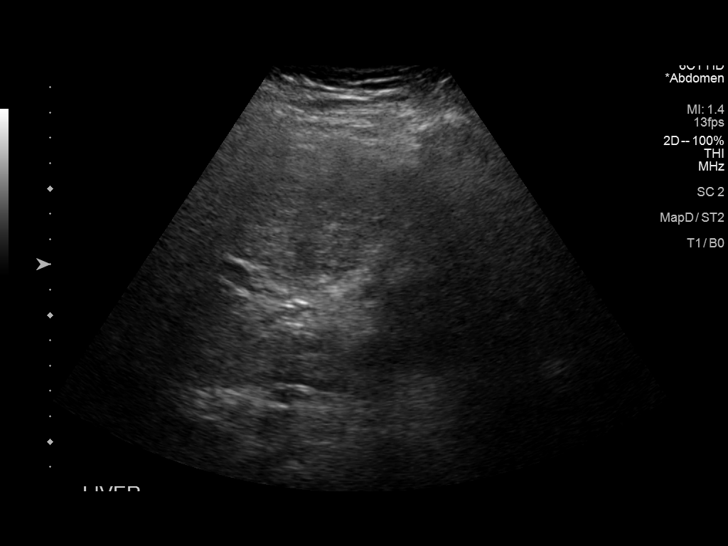
[im 36/40]
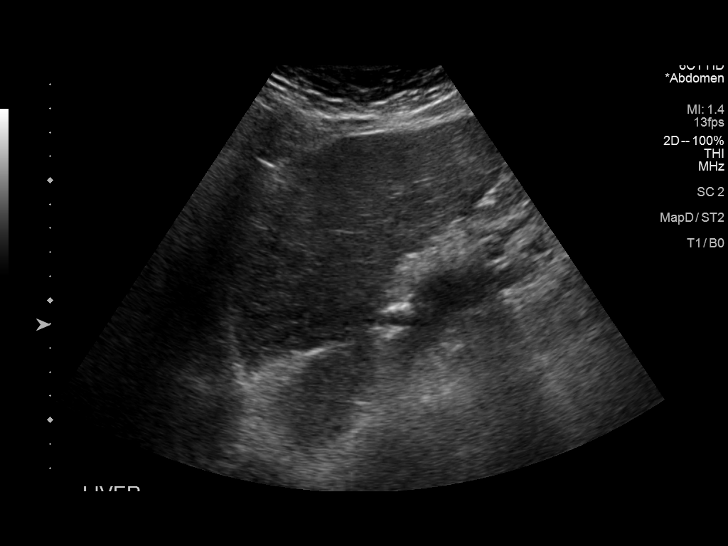
[im 40/40]
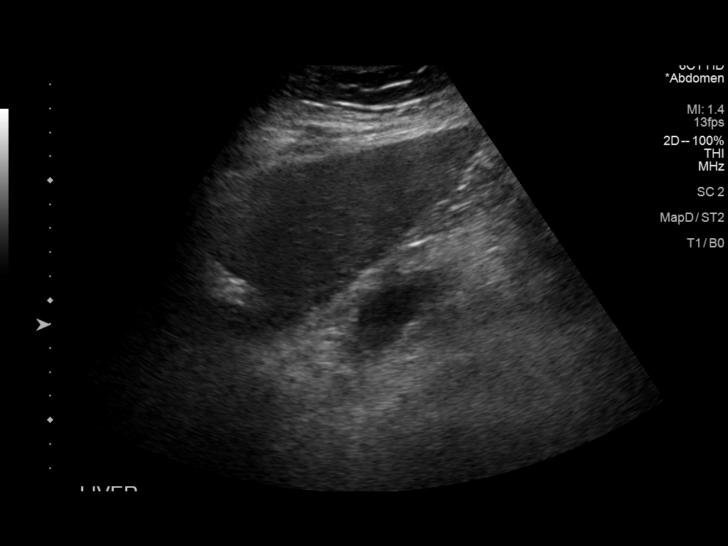

[14 of 25 positions shown; findings below may reference images not displayed]

FINDINGS: Gallbladder:

Diffuse echogenic material filling the gallbladder with some
shadowing. Negative sonographic Murphy. Increased wall thickness at
8 mm.

Common bile duct:

Diameter: 4.5 mm

Liver:

Coarse heterogeneous echogenic liver corresponding to history of
cirrhosis. No focal hepatic abnormality. Portal vein is patent on
color Doppler imaging with normal direction of blood flow towards
the liver. History of tips.

Other: None.
IMPRESSION: 1. Liver cirrhosis without focal hepatic mass lesion.
2. Gallbladder appears diffusely filled with echogenic material,
which may be a combination of sludge and stones. Increased
gallbladder wall thickness which is nonspecific. Negative
sonographic Murphy.

## 2021-05-14 ENCOUNTER — Telehealth: Payer: Self-pay | Admitting: Gastroenterology

## 2021-05-14 NOTE — Telephone Encounter (Signed)
Patient returned my call and we discussed his labs. The patient is aware of his Hgb A1C and has an appt scheduled to see his PCP

## 2021-05-14 NOTE — Telephone Encounter (Signed)
Received lab results from labs conducted at [facility] which were notable for the following:  - NA 140, K4.1, BUN/creatinine 19/0.86 - AST/ALT 31/27, ALP 78, bilirubin 1.7, protein 7.3, albumin 4.1 - Ferritin 76, iron 115, TIBC 326, sat 35% - A1c 10.0 - B12 1409 - WBC 6.3, H/H14.3/39.5 with MCV/RDW 96.9/14.4, PLT 90   Good renal function, normal electrolytes, liver enzymes normal and bilirubin stable from previous with normal albumin.  No INR performed to fully calculate MELD.  PLT also largely stable from prior and otherwise normal CBC.  Recommend follow-up with PCM due to hemoglobin A1c 10.0.

## 2021-05-14 NOTE — Telephone Encounter (Signed)
Left a voicemail for the patient to call regarding his labs.

## 2021-06-30 ENCOUNTER — Other Ambulatory Visit: Payer: Self-pay | Admitting: Interventional Radiology

## 2021-06-30 DIAGNOSIS — Z95828 Presence of other vascular implants and grafts: Secondary | ICD-10-CM

## 2021-07-14 ENCOUNTER — Other Ambulatory Visit: Payer: Self-pay | Admitting: Gastroenterology

## 2021-08-06 ENCOUNTER — Ambulatory Visit
Admission: RE | Admit: 2021-08-06 | Discharge: 2021-08-06 | Disposition: A | Discharge status: Home / Self Care | Payer: Medicaid Other | Source: Ambulatory Visit | Attending: Interventional Radiology | Admitting: Interventional Radiology

## 2021-08-06 ENCOUNTER — Encounter: Payer: Self-pay | Admitting: *Deleted

## 2021-08-06 DIAGNOSIS — Z95828 Presence of other vascular implants and grafts: Secondary | ICD-10-CM

## 2021-08-06 HISTORY — PX: IR RADIOLOGIST EVAL & MGMT: IMG5224

## 2021-08-06 NOTE — Progress Notes (Signed)
Chief Complaint: Patient was seen in consultation today for TIPS follow-up at the request of Emri Sample K  Referring Physician(s): Gerrit Heck, MD  History of Present Illness: Martin Strong is a 55 y.o. male with nonalcoholic steatohepatitis related cirrhosis initially diagnosed in February 4315 and complicated by bleeding esophageal varices in October 2019 which was successfully treated by endoscopic banding and propranolol for dual prophylaxis.  Additionally, patient began to develop decompensation and large-volume ascites in October 2020 requiring large-volume paracentesis with removal of 5 L of ascites and initiation of oral diuretic therapy.   He subsequently required additional paracentesis on 10/24/2019 with removal of 7 L of ascites, and again on 11/16/2019 with removal of 7 L of ascites.  He was initially referred to me in January to evaluate for possible TIPS creation.  At that time, we proceeded with standard work-up including echocardiography.  Unfortunately, in February he developed spontaneous bacterial peritonitis and acute kidney injury.  His diuretics were subsequently discontinued.  He has since recovered and is on ciprofloxacin 750 mg weekly for SBP prophylaxis.   He underwent elective TIPS creation on 03/12/2020. There was concern that there was no flow within the TIPS stent on his initial follow-up TIPS duplex ultrasound performed on 04/09/2020.  Therefore, he underwent TIPS revision on 04/11/2020.  Fortunately, the TIPS stent was patent.  However, at that time it was increased to 10 mm in diameter with fairly minimal change in his underlying portal pressure which remained elevated at greater than 12 mmHg.   Today, Martin Strong presents for follow-up evaluation.  He required a total of 3 paracenteses following his TIPS revision, 2 in May and one on 1 June.  He has not required paracentesis since that time, now over one year!    He was admitted to [facility] in early November 2021 with acute hepatic encephalopathy due to noncompliance and missed doses of lactulose.  He was treated with lactulose and Xifaxan and was successfully discharged back at his baseline.  He has since been approved for Xifaxan and now takes 550 mg twice daily. He is also compliant with his lactulose and having multiple soft stools daily.  Additionally, he reports that he remains on Lasix 80 mg and Aldactone 25 mg daily.  He continues to follow-up with Dr. Bryan Lemma of gastroenterology, nephrology and [facility].     Overall, he is quite pleased with his clinical result and is very happy to no longer require large-volume paracentesis.  He still has issues with relatively low energy.  Today he also complains of forgetfulness.  He has not been evaluated for this but does have an upcoming annual appointment with his primary care physician in September.  Otherwise, he has no active complaints at this time.  Past Medical History:  Diagnosis Date   Anemia    Arthritis    Cirrhosis ([place])    Diabetes mellitus without complication ([place])    Dyspnea    Fatty liver    GERD (gastroesophageal reflux disease)    GI bleed    Hyperlipidemia    Hypertension     Past Surgical History:  Procedure Laterality Date   BIOPSY  02/14/2019   Procedure: BIOPSY;  Surgeon: Lavena Bullion, DO;  Location: [facility];  Service: Gastroenterology;;   BIOPSY  07/22/2019   Procedure: BIOPSY;  Surgeon: Lavena Bullion, DO;  Location: [facility];  Service: Gastroenterology;;   ESOPHAGEAL BANDING  10/07/2018   Procedure: ESOPHAGEAL BANDING;  Surgeon: Bryan Lemma,  Dominic Pea, DO;  Location: [facility];  Service: Gastroenterology;;   ESOPHAGEAL BANDING N/A 01/03/2019   Procedure: ESOPHAGEAL BANDING;  Surgeon: Lavena Bullion, DO;  Location: [facility];  Service: Gastroenterology;  Laterality: N/A;   ESOPHAGEAL BANDING  02/14/2019   Procedure: ESOPHAGEAL BANDING;  Surgeon: Lavena Bullion, DO;  Location: [facility];  Service: Gastroenterology;;   esophageal bands     ESOPHAGOGASTRODUODENOSCOPY Left 07/22/2019   Procedure: ESOPHAGOGASTRODUODENOSCOPY (EGD) WITH POSSIBLE BANDING;  Surgeon: Lavena Bullion, DO;  Location: [facility];  Service: Gastroenterology;  Laterality: Left;   ESOPHAGOGASTRODUODENOSCOPY (EGD) WITH PROPOFOL N/A 10/07/2018   Procedure: ESOPHAGOGASTRODUODENOSCOPY (EGD) WITH PROPOFOL;  Surgeon: Lavena Bullion, DO;  Location: [facility];  Service: Gastroenterology;  Laterality: N/A;   ESOPHAGOGASTRODUODENOSCOPY (EGD) WITH PROPOFOL N/A 01/03/2019   Procedure: ESOPHAGOGASTRODUODENOSCOPY (EGD) WITH PROPOFOL;  Surgeon: Lavena Bullion, DO;  Location: [facility];  Service: Gastroenterology;  Laterality: N/A;   ESOPHAGOGASTRODUODENOSCOPY (EGD) WITH PROPOFOL N/A 02/14/2019   Procedure: ESOPHAGOGASTRODUODENOSCOPY (EGD) WITH PROPOFOL;  Surgeon: Lavena Bullion, DO;  Location: [facility];  Service: Gastroenterology;  Laterality: N/A;   IR PARACENTESIS  10/05/2019   IR PARACENTESIS  10/24/2019   IR PARACENTESIS  11/16/2019   IR PARACENTESIS  12/25/2019   IR PARACENTESIS  01/05/2020   IR PARACENTESIS  01/23/2020   IR PARACENTESIS  02/08/2020   IR PARACENTESIS  02/16/2020   IR PARACENTESIS  02/28/2020   IR PARACENTESIS  03/12/2020   IR PARACENTESIS  04/04/2020   IR PARACENTESIS  04/11/2020   IR PARACENTESIS  04/24/2020   IR PARACENTESIS  05/03/2020   IR PARACENTESIS  05/14/2020   IR RADIOLOGIST EVAL & MGMT  12/19/2019   IR RADIOLOGIST EVAL & MGMT  02/27/2020   IR RADIOLOGIST EVAL & MGMT  04/09/2020   IR RADIOLOGIST EVAL & MGMT  05/09/2020   IR RADIOLOGIST EVAL & MGMT  01/30/2021   IR TIPS  03/12/2020   IR TIPS REVISION MOD SED  04/11/2020   RADIOLOGY WITH ANESTHESIA N/A 03/12/2020   Procedure: TIPS;  Surgeon: Jacqulynn Cadet, MD;  Location: [facility];  Service: Radiology;  Laterality: N/A;    Allergies: Nadolol  Medications: Prior to Admission medications    Medication Sig Start Date End Date Taking? Authorizing Provider  cholecalciferol (VITAMIN D) 1000 units tablet Take 1,000 Units by mouth daily.     [provider]  ciprofloxacin (CIPRO) 500 MG tablet TAKE 1&1/2 TABLETS BY MOUTH WEEKLY 07/14/21   Cirigliano, Vito V, DO  dapagliflozin propanediol (FARXIGA) 10 MG TABS tablet Take 10 mg by mouth at bedtime.    [provider]  feeding supplement, ENSURE ENLIVE, (ENSURE ENLIVE) LIQD Take 237 mLs by mouth 2 (two) times daily between meals. 01/28/20   Hosie Poisson, MD  ferrous sulfate (KP FERROUS SULFATE) 325 (65 FE) MG tablet Take 1 tablet (325 mg total) by mouth daily with breakfast. Patient taking differently: Take 325 mg by mouth daily. 05/30/19   Cirigliano, Vito V, DO  furosemide (LASIX) 40 MG tablet Take 2 tablets (80 mg total) by mouth daily. 03/11/21   Cirigliano, Vito V, DO  glipiZIDE (GLUCOTROL XL) 10 MG 24 hr tablet Take 10 mg by mouth 2 (two) times daily.    [provider]  lactulose (CHRONULAC) 10 GM/15ML solution Take 30 mLs (20 g total) by mouth 3 (three) times daily. 03/13/20   Ardis Rowan, PA-C  levOCARNitine (CARNITOR) 330 MG tablet Take 330 mg by mouth 3 (three) times daily.  [provider]  metFORMIN (GLUCOPHAGE) 1000 MG tablet Take 1,000 mg by mouth 2 (two) times daily with a meal.    [provider]  Multiple Vitamin (MULTIVITAMIN WITH MINERALS) TABS tablet Take 1 tablet by mouth at bedtime.    [provider]  Multiple Vitamins-Minerals (ZINC PO) Take 1 tablet by mouth daily.    [provider]  potassium chloride SA (KLOR-CON) 20 MEQ tablet Take 1 tablet (20 mEq total) by mouth daily. 03/11/21   Cirigliano, Vito V, DO  propranolol (INDERAL) 40 MG tablet Take 0.5 tablets (20 mg total) by mouth 2 (two) times daily. Patient taking differently: Take 40 mg by mouth 2 (two) times daily. 01/27/20   Hosie Poisson, MD  spironolactone (ALDACTONE) 25 MG tablet Take 1  tablet (25 mg total) by mouth daily. 08/14/20   Cirigliano, Vito V, DO  XIFAXAN 550 MG TABS tablet Take 550 mg by mouth 2 (two) times daily. 01/29/21   [provider]     Family History  Problem Relation Age of Onset   Diabetes Other    Esophageal cancer Father    Colon cancer Neg Hx    Rectal cancer Neg Hx    Stomach cancer Neg Hx     Social History   Socioeconomic History   Marital status: Married    Spouse name: Not on file   Number of children: 2   Years of education: Not on file   Highest education level: Not on file  Occupational History   Not on file  Tobacco Use   Smoking status: Never   Smokeless tobacco: Never  Vaping Use   Vaping Use: Never used  Substance and Sexual Activity   Alcohol use: Not Currently   Drug use: Never   Sexual activity: Not on file  Other Topics Concern   Not on file  Social History Narrative   Not on file   Social Determinants of Health   Financial Resource Strain: Not on file  Food Insecurity: Not on file  Transportation Needs: Not on file  Physical Activity: Not on file  Stress: Not on file  Social Connections: Not on file    Review of Systems: A 12 point ROS discussed and pertinent positives are indicated in the HPI above.  All other systems are negative.  Review of Systems  Vital Signs: BP 128/61 (BP Location: Right Arm)   Pulse 76   SpO2 97%   Physical Exam Vitals reviewed.  Constitutional:      General: He is not in acute distress.    Appearance: Normal appearance. He is not ill-appearing.  HENT:     Head: Normocephalic and atraumatic.  Eyes:     General: No scleral icterus. Cardiovascular:     Rate and Rhythm: Normal rate.  Pulmonary:     Effort: Pulmonary effort is normal.  Abdominal:     General: Abdomen is flat. There is no distension.     Palpations: Abdomen is soft.  Skin:    General: Skin is warm and dry.     Coloration: Skin is not jaundiced.  Neurological:     Mental Status: He is alert  and oriented to person, place, and time.  Psychiatric:        Mood and Affect: Mood normal.        Behavior: Behavior normal.      Imaging: No results found.  Labs:  CBC: Recent Labs    10/19/20 0940 10/20/20 0351 10/21/20 0241 01/28/21 0000  WBC 6.5 5.2 4.7 6.0  HGB 13.2 11.1* 10.9* 13.7  HCT 35.6* 31.2* 29.7* 37.6*  PLT 114* 73* 62* 100*    COAGS: Recent Labs    10/19/20 1600 10/20/20 0351 01/28/21 0000  INR 1.3* 1.4* 1.1  APTT  --  32  --     BMP: Recent Labs    10/20/20 0351 10/20/20 1544 10/21/20 0241 01/28/21 0000  NA 144 137 138 138  K 3.3* 4.2 3.4* 4.4  CL 114* 106 107 101  CO2 21* 22 22 29   GLUCOSE 84 253* 216* 310*  BUN 16 16 15 18   CALCIUM 8.8* 8.6* 8.4* 9.8  CREATININE 0.79 0.90 0.97 0.93  GFRNONAA >60 >60 >60 93  GFRAA  --   --   --  108    LIVER FUNCTION TESTS: Recent Labs    10/19/20 0940 10/20/20 0351 10/21/20 0241 01/28/21 0000  BILITOT 2.0* 2.0* 1.8* 1.4*  AST 37 34 34 31  ALT 34 31 29 30   ALKPHOS 81 68 65  --   PROT 7.4 6.2* 6.0* 7.1  ALBUMIN 3.7 3.1* 3.0*  --     TUMOR MARKERS: No results for input(s): AFPTM, CEA, CA199, CHROMGRNA in the last 8760 hours.  Assessment and Plan:  Martin Strong is doing extremely well clinically.  He has not had symptomatic ascites now in over a year.  His last paracentesis was June 2021.  Additionally, since he has begun rifaximin he has had no further issues with symptomatic hepatic encephalopathy.  He does complain of some fatigue and forgetfulness.  1.)  I encouraged him to follow-up with his primary care physician regarding his forgetfulness.  He may benefit from some testing for evidence of early dementia.  2.)  Continue routine TIPS surveillance.  Next TIPS duplex ultrasound with liver labs and accompanying clinic visit in 1 year.   Electronically Signed: Criselda Peaches 08/06/2021, 10:55 AM   I spent a total of  15 Minutes in face to face in clinical consultation, greater  than 50% of which was counseling/coordinating care for NASH cirrhosis complicated by ascites.

## 2021-09-22 ENCOUNTER — Other Ambulatory Visit: Payer: Self-pay | Admitting: Gastroenterology

## 2021-10-27 ENCOUNTER — Telehealth: Payer: Self-pay | Admitting: Gastroenterology

## 2021-10-27 ENCOUNTER — Other Ambulatory Visit: Payer: Self-pay | Admitting: General Surgery

## 2021-10-27 MED ORDER — XIFAXAN 550 MG PO TABS: 550.0000 mg | ORAL_TABLET | Freq: Two times a day (BID) | ORAL | 5 refills | Status: DC

## 2021-10-27 NOTE — Telephone Encounter (Signed)
Inbound call from patient requesting a refill for xifaxan sent to [facility] [phone]

## 2021-10-27 NOTE — Telephone Encounter (Signed)
Sent xifaxin to patients requested pharmacy

## 2021-10-30 NOTE — Telephone Encounter (Signed)
Patient is requesting a call back to discuss something about the Xifaxan medication.

## 2021-10-30 NOTE — Telephone Encounter (Signed)
Left a message on the patients voicemail to contact me back.

## 2021-11-03 ENCOUNTER — Telehealth: Payer: Self-pay | Admitting: General Surgery

## 2021-11-03 MED ORDER — XIFAXAN 550 MG PO TABS: 550.0000 mg | ORAL_TABLET | Freq: Two times a day (BID) | ORAL | 5 refills | Status: DC

## 2021-11-03 NOTE — Telephone Encounter (Signed)
The patient contacted me this am stating that [facility] has been bought out and his [facility]ur Rossetti rx will now be taken [facility] [phone]. Rx sent for the patient, he runs out next week

## 2021-11-10 ENCOUNTER — Other Ambulatory Visit: Payer: Self-pay | Admitting: General Surgery

## 2021-11-10 MED ORDER — XIFAXAN 550 MG PO TABS: 550.0000 mg | ORAL_TABLET | Freq: Two times a day (BID) | ORAL | 5 refills | Status: DC

## 2021-11-12 ENCOUNTER — Telehealth: Payer: Self-pay | Admitting: General Surgery

## 2021-11-12 NOTE — Telephone Encounter (Signed)
Contacted [facility] and spoke with Cat, she stated that [place] was not a registered client and therefore no prescriptions were there for him. Larance will need to contact them and register, at that point they will be able to fill a prescription. She was unable to tell me where the rxs go to if they are unable to be matched with a patient. Contacted Jarl and explained to him that he needs to call and set up an account. The patient stated he would call.

## 2021-11-12 NOTE — Telephone Encounter (Signed)
The patient contacted the office stating that [facility] Xifaxin rx. Patient requested samples, placed samples of Xifaxin 550 at front desk , bid for 7 days.

## 2021-11-14 ENCOUNTER — Telehealth: Payer: Self-pay | Admitting: Gastroenterology

## 2021-11-14 DIAGNOSIS — K7682 Hepatic encephalopathy: Secondary | ICD-10-CM

## 2021-11-14 MED ORDER — RIFAXIMIN 550 MG PO TABS: 550.0000 mg | ORAL_TABLET | Freq: Two times a day (BID) | ORAL | 5 refills | Status: AC

## 2021-11-14 NOTE — Telephone Encounter (Signed)
Contacted the patient to see if he contacted [facility] to set up a new patient account. The patient is the one who contacted [place] to stop sending rx to [facility]. He has yet to set up an account with them so they may process his xifaxin. I expressed to the patient he must do this so he gets his medication. The patient verbalized undertanding.

## 2021-11-14 NOTE — Telephone Encounter (Signed)
Patient stated that the new pharmacy phone number he gave [place] was incorrect it is actually [phone] and the fax number is [phone]. Contacted the phone number given and spoke with [place], this [facility] patient gave the wrong information to send his xifaxin to. [place] [address].   She stated to send over a new rx for the patient.

## 2021-11-14 NOTE — Telephone Encounter (Signed)
Patient called stated he wanted to follow up with you about Xifaxin. Please advise.

## 2021-11-18 ENCOUNTER — Telehealth: Payer: Self-pay | Admitting: General Surgery

## 2021-11-18 NOTE — Telephone Encounter (Signed)
Martin Strong contacted [employer] and verified the patients insurance while I was on the phone with [facility]. Patient has medicaid ID #[id number] L per pharmacy. They do not have the name of the mail order insurance.

## 2021-11-18 NOTE — Telephone Encounter (Signed)
[facility] is now saying they are not the patients specialty care pharmacy they have no rx for Xifaxin being sent-to them she shows that [facility], [facility] spoke with Chad-he stated that the patient has given [place] the wrong [facility]. The scripts have been sent to the specialty and not to the mail order. He was unable to give me the address to the mail order pharmacy. He stated he would transfer me to the mail order division and I spoke with EVA-eva was unable to locate and active pharmacy plan with his medicaid. She put me on hold for 30 minutes and never came back on the line.

## 2021-11-18 NOTE — Telephone Encounter (Signed)
Spoke with Barnabas Lister at [facility], the patient was mistaken about the address and phone number changes for [facility]. [facility] but everything is the same. Martin Strong will still have his rx filled there they. They ran his xifaxin for 30 days and 5 refills. They will call Martin Strong and advise him and get the rx out right away. Pt has a 4.00 copay

## 2021-11-27 ENCOUNTER — Other Ambulatory Visit: Payer: Self-pay

## 2021-11-27 ENCOUNTER — Encounter: Payer: Self-pay | Admitting: Gastroenterology

## 2021-11-27 ENCOUNTER — Ambulatory Visit (INDEPENDENT_AMBULATORY_CARE_PROVIDER_SITE_OTHER): Payer: Medicaid Other | Admitting: Gastroenterology

## 2021-11-27 ENCOUNTER — Other Ambulatory Visit: Payer: Self-pay | Admitting: General Surgery

## 2021-11-27 VITALS — BP 122/70 | HR 79 | Wt 222.0 lb

## 2021-11-27 DIAGNOSIS — Z8619 Personal history of other infectious and parasitic diseases: Secondary | ICD-10-CM

## 2021-11-27 DIAGNOSIS — K766 Portal hypertension: Secondary | ICD-10-CM | POA: Diagnosis not present

## 2021-11-27 DIAGNOSIS — R188 Other ascites: Secondary | ICD-10-CM

## 2021-11-27 DIAGNOSIS — K7682 Hepatic encephalopathy: Secondary | ICD-10-CM | POA: Diagnosis not present

## 2021-11-27 DIAGNOSIS — I8500 Esophageal varices without bleeding: Secondary | ICD-10-CM

## 2021-11-27 DIAGNOSIS — E60 Dietary zinc deficiency: Secondary | ICD-10-CM

## 2021-11-27 DIAGNOSIS — K3189 Other diseases of stomach and duodenum: Secondary | ICD-10-CM

## 2021-11-27 DIAGNOSIS — K7460 Unspecified cirrhosis of liver: Secondary | ICD-10-CM

## 2021-11-27 DIAGNOSIS — E119 Type 2 diabetes mellitus without complications: Secondary | ICD-10-CM

## 2021-11-27 DIAGNOSIS — K7581 Nonalcoholic steatohepatitis (NASH): Secondary | ICD-10-CM

## 2021-11-27 NOTE — Progress Notes (Signed)
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  Was previously followed by GI in [place], [place].  Cirrhosis complicated by esophageal varices with bleeding requiring multiple EGDs with banding in the past along with propranolol for dual prophylaxis.  Developed large volume ascites in 09/2019.  Previously diuretic intolerant, required serial large-volume paracentesis, TIPS placement with subsequent revision, and low-sodium diet, now back on diuretics. Admitted with SBP and AKI in 01/2020.  Admitted 04/2020 with hepatic encephalopathy-started lactulose/rifaximin.   Follows with [facility] at [facility].  MELD currently too low for liver transplant, and does not currently have candidate for living donor transplant.   Cirrhosis Hx: - Etiology: NASH - Diagnosed 2017 - Liver bx: None - Complications: EV with admission for bleeding 09/2018 treated with EGD with EVL, large volume ascites serial LVP starting 09/2019 TIPS 02/2020 (revision 03/2020), SBP 01/2020, hepatic encephalopathy 04/2020, sarcopenia -Hepatic Rx: Lactulose, rifaximin, Lasix 40 mg/day, Aldactone 25 mg/day, Cipro 750 mg/week, levocarnitine -Previous hepatic Rx: Propranolol (dual prophy) and PPI discontinued due to SBP.  History of diuretic intolerance and AKI.  Follows with Nephrology, managing Lasix/Aldactone - [place] Screening: [place] 07/2021 (no hepatoma, patent TIPS, no ascites), AFP <0.9 -03/13/2020: TIPS procedure -04/11/2020: TIPS revision -05/09/2020: Patent TIPS with high velocity flow throughout - Annual lab check: Completed 08/2021 (except INR was not checked) - HAV and HBV vaccine series completed - MELD: 9 (fluctuates 9-14) - Child Pugh: A   Endoscopic history: -  EGD x3 in 2017 in [place], [place] for esophageal varices with banding - EGD (09/2018, Dr. Bryan Lemma): Grade 2 esophageal varices with stigmata of recent bleeding requiring banding x2 with complete eradication, portal hypertensive gastropathy without bleeding. - EGD (12/2018, Dr. Bryan Lemma): Grade II esophageal varices, 6 bands placed with deflation. Normal Z line at 46 cm, severe portal HTN gastropathy with contact oozing, small GOV1, normal duodenum. H pylori serologies negative -EGD (02/2019, Dr. Bryan Lemma): Grade 2 esophageal varices, 4 bands placed with complete eradication.  Portal hypertensive gastropathy/2 adenopathy -EGD (07/2019, Dr. Bryan Lemma): Done for dysphagia. grade 1 esophageal varices, multiple post variceal banding scars in the lower third with 1 area of luminal deformity, likely consistent with prior deep banding site.  The lumen was mildly narrowed, but easily traversed and no additional endoscopic dilation performed.  Mild esophagitis at the GEJ, portal hypertensive gastropathy, peptic antritis/duodenitis.  Papilla prominent.  HPI:     Patient is a 55 y.o. male presenting to the [facility]astroenterology Clinic for follow-up.  Was last seen by me on 03/11/2021 and last seen in [place] on 08/29/2021.  Dawn Drazek had discussed potentially stopping Cipro given functioning TIPS and no ascites on imaging, but he is hesitant to stop given prior history of SBP.  Otherwise, taking all medications as prescribed.   Most recent labs from [facility] lab reviewed and notable for the following: BG 248, hemoglobin A1c 10.6 - Sodium 139, K 4.2, BUN/creatinine 18/0.83 - Albumin 4.2, bilirubin 1.7, ALP 87, AST/ALT 29/28 - Normal iron panel, vitamin D - H/H 15.5/43.6, WBC 7.0, PLT 104  Today, he states he feels well and no complaints. Was seen by his PCM last week for DM management and has f/u again next week.   Review of systems:  No chest pain, no SOB, no fevers, no urinary sx   Past Medical  History:  Diagnosis Date   Anemia    Arthritis    Cirrhosis ([place])    Diabetes mellitus without complication (HCC)    Dyspnea    Fatty liver    GERD (gastroesophageal reflux disease)    GI bleed    Hyperlipidemia    Hypertension     Patient's surgical history, family medical history, social history, medications and allergies were all reviewed in Epic    Current Outpatient Medications  Medication Sig Dispense Refill   cholecalciferol (VITAMIN D) 1000 units tablet Take 1,000 Units by mouth daily.      ciprofloxacin (CIPRO) 500 MG tablet TAKE 1&1/2 TABLETS BY MOUTH WEEKLY 8 tablet 3   dapagliflozin propanediol (FARXIGA) 10 MG TABS tablet Take 10 mg by mouth at bedtime.     feeding supplement, ENSURE ENLIVE, (ENSURE ENLIVE) LIQD Take 237 mLs by mouth 2 (two) times daily between meals. 237 mL 12   ferrous sulfate (KP FERROUS SULFATE) 325 (65 FE) MG tablet Take 1 tablet (325 mg total) by mouth daily with breakfast. (Patient taking differently: Take 325 mg by mouth daily.) 30 tablet 3   furosemide (LASIX) 40 MG tablet Take 2 tablets (80 mg total) by mouth daily. 90 tablet 5   glipiZIDE (GLUCOTROL XL) 10 MG 24 hr tablet Take 10 mg by mouth 2 (two) times daily.     lactulose (CHRONULAC) 10 GM/15ML solution Take 30 mLs (20 g total) by mouth 3 (three) times daily. 236 mL 0   levOCARNitine (CARNITOR) 330 MG tablet Take 330 mg by mouth 3 (three) times daily.     metFORMIN (GLUCOPHAGE) 1000 MG tablet Take 1,000 mg by mouth 2 (two) times daily with a meal.     Multiple Vitamin (MULTIVITAMIN WITH MINERALS) TABS tablet Take 1 tablet by mouth at bedtime.     Multiple Vitamins-Minerals (ZINC PO) Take 1 tablet by mouth daily.     potassium chloride SA (KLOR-CON) 20 MEQ tablet Take 1 tablet (20 mEq total) by mouth daily. 90 tablet 5   propranolol (INDERAL) 40 MG tablet Take 0.5 tablets (20 mg total) by mouth 2 (two) times daily. (Patient taking differently: Take 40 mg by mouth 2 (two) times daily.) 60  tablet 0   rifaximin (XIFAXAN) 550 MG TABS tablet Take 1 tablet (550 mg total) by mouth 2 (two) times daily. 60 tablet 5   spironolactone (ALDACTONE) 25 MG tablet TAKE 1 TABLET BY MOUTH DAILY 90 tablet 5   No current facility-administered medications for this visit.    Physical Exam:     BP 122/70 (BP Location: Right Arm, Patient Position: Sitting, Cuff Size: Normal)    Pulse 79    Wt 222 lb (100.7 kg)    SpO2 97%    BMI 30.11 kg/m   GENERAL:  Pleasant male in NAD PSYCH: : Cooperative, normal affect EENT:  conjunctiva pink, mucous membranes moist, temporal muscle atrophy but improved from previous exam CARDIAC:  RRR, no murmur heard, no peripheral edema PULM: Normal respiratory effort, lungs CTA bilaterally, no wheezing ABDOMEN:  Soft, nontender. No obvious masses, normal bowel sounds SKIN:  turgor, no lesions seen Musculoskeletal: Improved muscle tone compared with previous exam, normal strength NEURO: Alert and oriented x 3, no focal neurologic deficits   IMPRESSION and PLAN:    1) NASH Cirrhosis- MELD 9 2) Ascites-resolved with TIPS 3) SBP 01/2020 4) Esophageal varices 5) Portal hypertensive gastropathy 6)  Sarcopenia-improving 7) Hepatic encephalopathy-controlled 8) Zinc deficiency  - No need for surveillance endoscopy given functioning TIPS - Continue lactulose and rifaximin - Continue zinc and levocarnitine - Continue Lasix 40 mg/day and Aldactone 25 mg/day for now.  Did discuss possibly reducing or discontinuing in the future given functioning TIPS and lack of ascites on repeat imaging - Continue follow-up in [place] every 6 months as directed - Imaging for hepatoma screening being coordinated through [place] - PPI previously discontinued due to SBP history - Continue low-sodium diet - UTD on vaccines - Continue branched chain amino acids for sarcopenia - Discussed possibility of stopping Cipro (prescribed 750 mg/week for SBP prophylaxis) due to  lack of ascites with functioning TIPS along with some data to support some prophylactic benefit of rifaximin.  However, he is quite hesitant due to prior history of SBP, concerns about repeat infection, etc.  Therefore, mutually agreed to continue Cipro for SBP prophylaxis for now - MRI Liver protocol in February/March 2023 for ongoing [place] screening       9) Diabetes - Continue close follow-up with PCM for continued management of diabetes with elevated hemoglobin A1c  -Since he follows up with [facility] every 6 months, reasonable to follow-up with me just yearly, although he is always welcome to follow-up sooner if needed     I spent 40 minutes of time, including in depth chart review, independent review of results as outlined above, communicating results with the patient directly, face-to-face time with the patient, coordinating care, ordering studies and medications as appropriate, and documentation.   Lavena Bullion ,DO, FACG 11/27/2021, 9:21 AM

## 2021-11-27 NOTE — Patient Instructions (Addendum)
If you are age 54 or older, your body mass index should be between 23-30. Your Body mass index is 30.11 kg/m. If this is out of the aforementioned range listed, please consider follow up with your Primary Care Provider.  If you are age 55 or younger, your body mass index should be between 19-25. Your Body mass index is 30.11 kg/m. If this is out of the aformentioned range listed, please consider follow up with your Primary Care Provider.   __________________________________________________________  The [facility] providers would like to encourage you to use [facility] to communicate with providers for non-urgent requests or questions.  Due to long hold times on the telephone, sending your provider a message by [facility] may be a faster and more efficient way to get a response.  Please allow 48 business hours for a response.  Please remember that this is for non-urgent requests.   You have been scheduled for an MRI at [facility], located at [place]. Lawrence Santiago in the [facility]. Your appointment is scheduled on _________ at _______. Please arrive 15 minutes prior to your appointment time for registration purposes. Please make certain not to have anything to eat or drink 6 hours prior to your test. In addition, if you have any metal in your body, have a pacemaker or defibrillator, please be sure to let your ordering physician know. This test typically takes 45 minutes to 1 hour to complete. Should you need to reschedule, please call [phone].    Return to the office in 1 year or sooner if needed.  Thank you for choosing me and [facility].  Vito Cirigliano, D.O.

## 2022-01-02 ENCOUNTER — Ambulatory Visit (HOSPITAL_COMMUNITY)
Admission: RE | Admit: 2022-01-02 | Discharge: 2022-01-02 | Disposition: A | Discharge status: Home / Self Care | Payer: Medicaid Other | Source: Ambulatory Visit | Attending: Gastroenterology | Admitting: Gastroenterology

## 2022-01-02 ENCOUNTER — Other Ambulatory Visit: Payer: Self-pay

## 2022-01-02 DIAGNOSIS — K7460 Unspecified cirrhosis of liver: Secondary | ICD-10-CM | POA: Diagnosis present

## 2022-01-02 DIAGNOSIS — K7581 Nonalcoholic steatohepatitis (NASH): Secondary | ICD-10-CM | POA: Diagnosis present

## 2022-01-02 DIAGNOSIS — R188 Other ascites: Secondary | ICD-10-CM | POA: Diagnosis present

## 2022-01-02 IMAGING — MR MR ABDOMEN WO/W CM
18 series · 48 of 48 positions shown · IV contrast (10 GADAVIST)
Comparison: [DATE]

CLINICAL DATA: Cirrhosis

EXAM:
MRI ABDOMEN WITHOUT AND WITH CONTRAST
TECHNIQUE: Multiplanar multisequence MR imaging of the abdomen was performed
both before and after the administration of intravenous contrast.
CONTRAST:  10mL GADAVIST GADOBUTROL 1 MMOL/ML IV SOLN

[Series 3: DWI · axial · 6.0mm · 1.68mm/px · z∈[-139,+142]mm · 4 of 80 slices shown (1 of 2)]
[im 1/80]
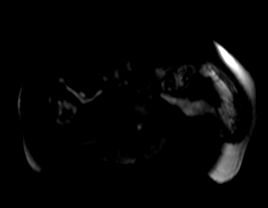
[im 27/80]
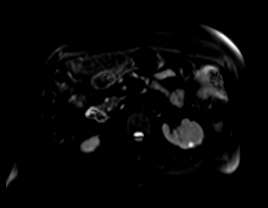
[im 53/80]
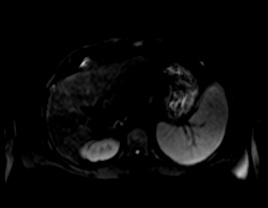
[im 80/80]
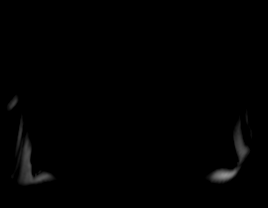

[Series 4: DWI · axial · 6.0mm · 1.68mm/px · z∈[-139,+142]mm · 2 of 40 slices shown (2 of 2)]
[im 1/40]
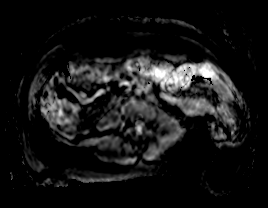
[im 40/40]
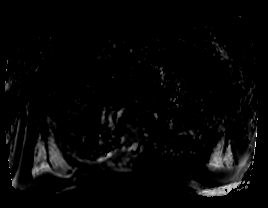

[Series 6: T2 fat-sat · axial · 6.0mm · 1.25mm/px · z∈[-138,+143]mm · 2 of 40 slices shown]
[im 1/40]
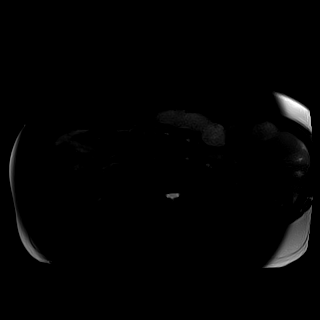
[im 40/40]
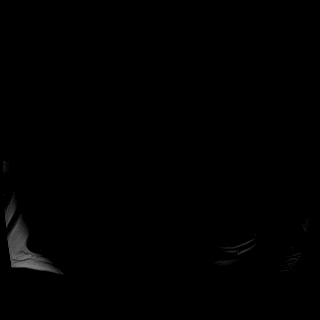

[Series 7: cor haste · coronal · 6.0mm · 1.56mm/px · 1 of 43 slices shown]
[im 1/43]
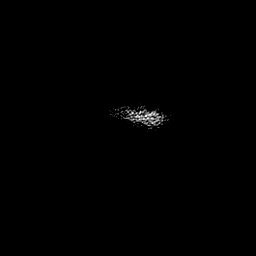

[Series 8: bSSFP · axial · 6.0mm · 2.34mm/px · z∈[-156,+132]mm · 2 of 49 slices shown]
[im 1/49]
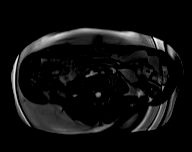
[im 49/49]
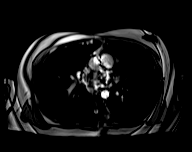

[Series 9: T1 dynamic · axial · 3.0mm · 1.41mm/px · z∈[-154,+131]mm · 3 of 96 slices shown (1 of 9)]
[im 1/96]
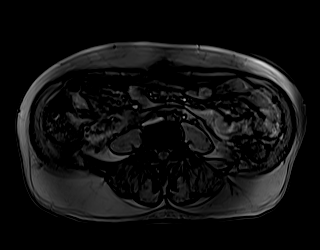
[im 48/96]
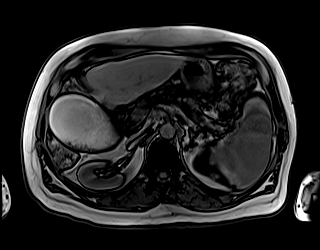
[im 96/96]
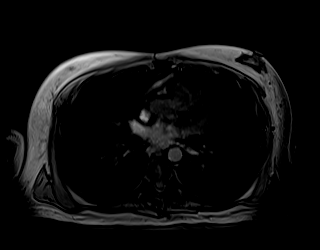

[Series 10: T1 dynamic · axial · 3.0mm · 1.41mm/px · z∈[-154,+131]mm · 3 of 96 slices shown (2 of 9)]
[im 1/96]
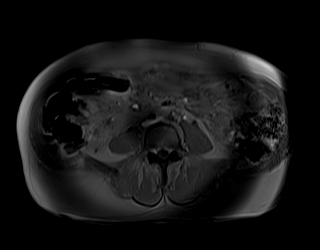
[im 48/96]
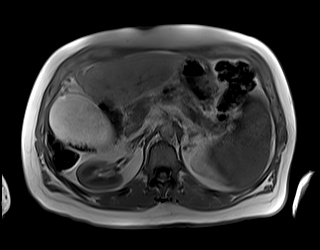
[im 96/96]
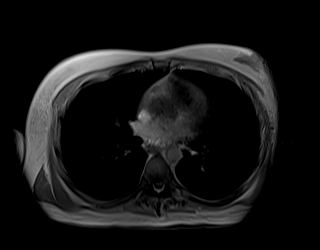

[Series 17: T1 dynamic · axial · 3.0mm · 1.41mm/px · z∈[-154,+131]mm · 3 of 96 slices shown (3 of 9)]
[im 1/96]
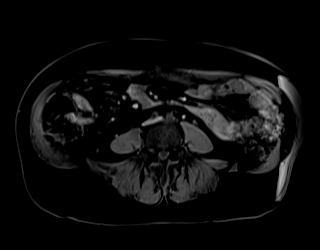
[im 48/96]
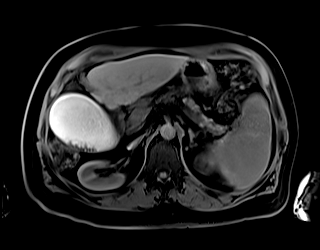
[im 96/96]
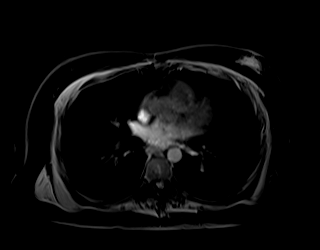

[Series 19: T1 dynamic · axial · 3.0mm · 1.41mm/px · z∈[-154,+131]mm · 3 of 96 slices shown (4 of 9)]
[im 1/96]
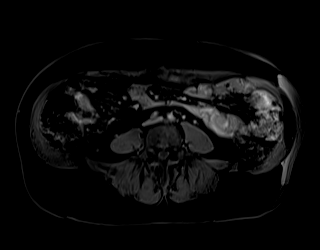
[im 48/96]
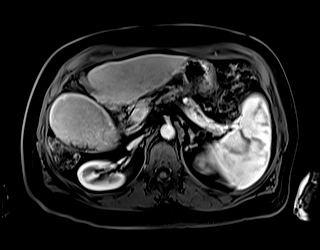
[im 96/96]
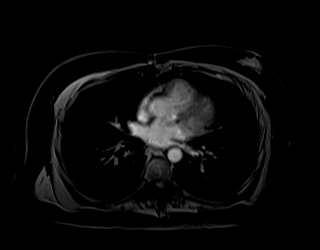

[Series 20: T1 dynamic · axial · 3.0mm · 1.41mm/px · z∈[-154,+131]mm · 3 of 96 slices shown (5 of 9)]
[im 1/96]
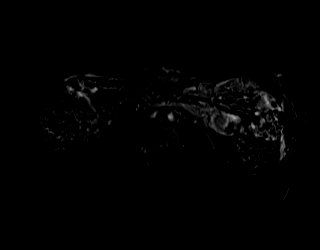
[im 48/96]
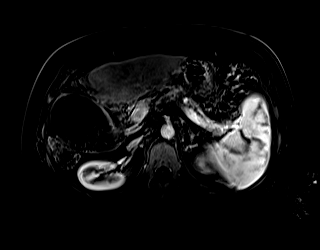
[im 96/96]
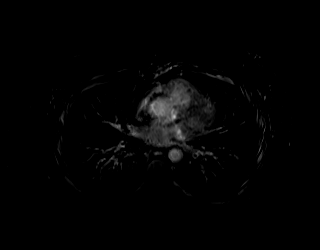

[Series 22: T1 dynamic · axial · 3.0mm · 1.41mm/px · z∈[-154,+131]mm · 3 of 96 slices shown (6 of 9)]
[im 1/96]
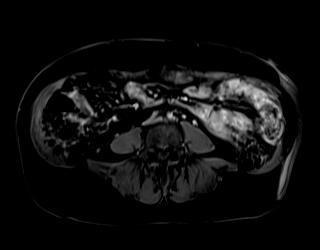
[im 48/96]
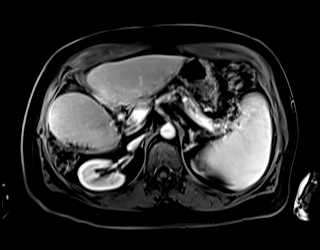
[im 96/96]
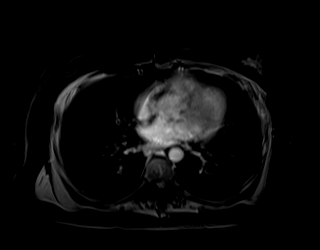

[Series 23: T1 dynamic · axial · 3.0mm · 1.41mm/px · z∈[-154,+131]mm · 3 of 96 slices shown (7 of 9)]
[im 1/96]
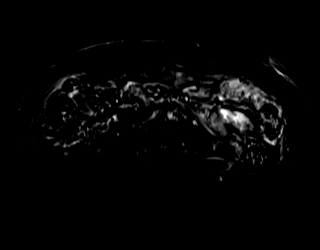
[im 48/96]
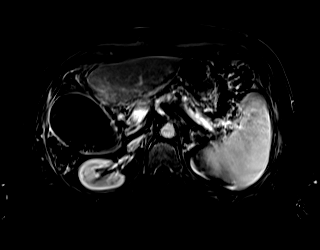
[im 96/96]
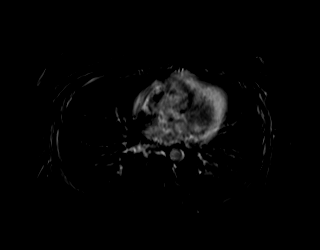

[Series 25: T1 dynamic · axial · 3.0mm · 1.41mm/px · z∈[-154,+131]mm · 3 of 96 slices shown (8 of 9)]
[im 1/96]
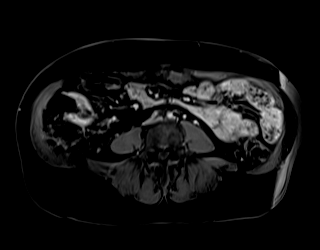
[im 48/96]
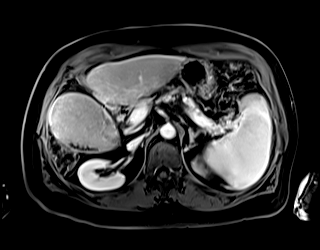
[im 96/96]
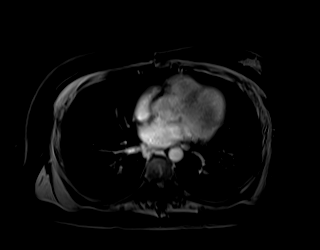

[Series 26: T1 dynamic · axial · 3.0mm · 1.41mm/px · z∈[-154,+131]mm · 3 of 96 slices shown (9 of 9)]
[im 1/96]
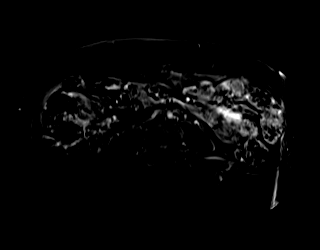
[im 48/96]
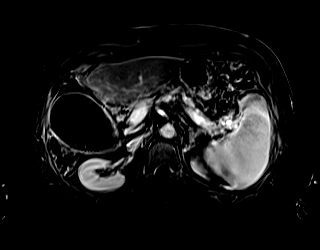
[im 96/96]
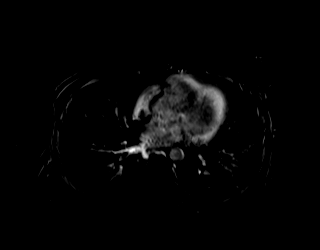

[Series 27: ax_haste_mbh · axial · 6.0mm · 1.41mm/px · 1 of 41 slices shown]
[im 1/41]
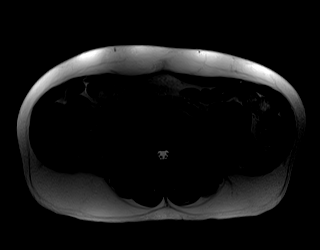

[Series 29: cor_vibe_dixon_delayed_w · coronal · 4.0mm · 2.01mm/px · 3 of 80 slices shown]
[im 1/80]
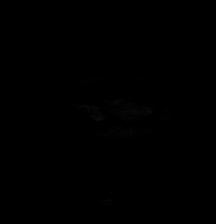
[im 40/80]
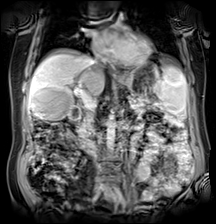
[im 80/80]
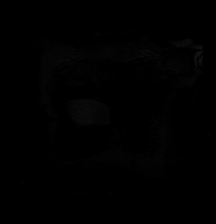

[Series 31: ax_dixon_delayed_w_reg · axial · 3.0mm · 1.41mm/px · z∈[-154,+131]mm · 3 of 96 slices shown]
[im 1/96]
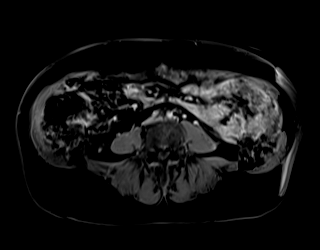
[im 48/96]
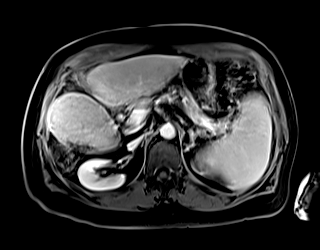
[im 96/96]
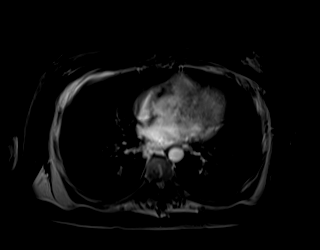

[Series 32: ax_dixon_delayed_w_reg_sub · axial · 3.0mm · 1.41mm/px · z∈[-154,+131]mm · 3 of 96 slices shown]
[im 1/96]
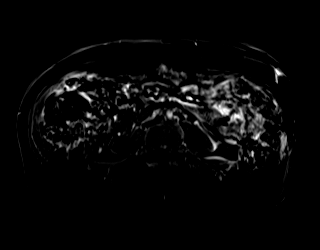
[im 48/96]
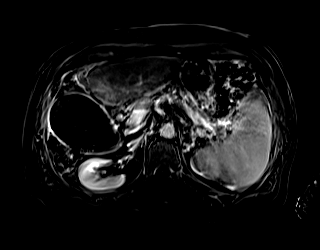
[im 96/96]
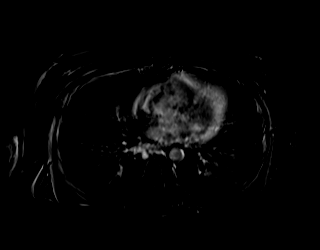

[48 of 48 positions shown; findings below may reference images not displayed]

FINDINGS: Lower chest: No acute findings.

Hepatobiliary: Coarse, nodular contour of the liver. No mass or
other parenchymal abnormality identified. Distended gallbladder
containing numerous tiny gallstones dependently. No biliary ductal
dilatation. No biliary ductal dilatation.

Pancreas: There are numerous subcentimeter fluid signal cystic
lesions throughout the pancreatic parenchyma (series 6, image 22,
25, 28). No solid mass, inflammatory changes, or other parenchymal
abnormality identified.No pancreatic ductal dilatation.

Spleen:  Splenomegaly, maximum coronal span 16.4 cm.

Adrenals/Urinary Tract: Normal adrenal glands. No renal masses or
suspicious contrast enhancement identified. No evidence of
hydronephrosis.

Stomach/Bowel: Visualized portions within the abdomen are
unremarkable.

Vascular/Lymphatic: No pathologically enlarged lymph nodes
identified. No abdominal aortic aneurysm demonstrated.

Other:  None.

Musculoskeletal: No suspicious osseous lesions identified.
IMPRESSION: 1. No suspicious liver lesions identified.
2. Cirrhotic morphology of the liver.
3. Splenomegaly, maximum coronal span 16.4 cm.
4. Distended gallbladder containing numerous tiny gallstones. No
biliary ductal dilatation.
5. Numerous subcentimeter fluid signal cystic lesions throughout the
pancreatic parenchyma. Findings may represent sequela of prior
pancreatitis or small IPMNs. As there is no observed increased risk
of malignancy for such lesions smaller than 2 cm, no specific
follow-up or characterization is required.

## 2022-01-02 MED ORDER — GADOBUTROL 1 MMOL/ML IV SOLN
10.0000 mL | Freq: Once | INTRAVENOUS | Status: AC | PRN
  Administered 2022-01-02: 10 mL via INTRAVENOUS

## 2022-01-07 ENCOUNTER — Telehealth: Payer: Self-pay | Admitting: General Surgery

## 2022-01-07 DIAGNOSIS — K3189 Other diseases of stomach and duodenum: Secondary | ICD-10-CM

## 2022-01-07 DIAGNOSIS — K7030 Alcoholic cirrhosis of liver without ascites: Secondary | ICD-10-CM

## 2022-01-07 NOTE — Telephone Encounter (Signed)
Left a detailed message for the patient. Expressed we will need to do his repeat US and AFP in 6 months. The patient was instructed to call with any questions. Orders placed for 6 months.

## 2022-01-07 NOTE — Telephone Encounter (Signed)
-----   Message from [place], DO sent at 01/06/2022  1:14 PM EST ----- MRI liver without liver mass or concern for [place].  Cirrhotic appearing liver splenomegaly.  There are numerous subcentimeter cystic lesions throughout the pancreas but no inflammatory changes, solid masses or duct dilatation.  No specific repeat imaging per Radiologist recommendation.  Plan for ultrasound and AFP in 6 months for ongoing [place] screening.

## 2022-01-15 ENCOUNTER — Ambulatory Visit (HOSPITAL_BASED_OUTPATIENT_CLINIC_OR_DEPARTMENT_OTHER)
Admission: RE | Admit: 2022-01-15 | Discharge: 2022-01-15 | Disposition: A | Discharge status: Home / Self Care | Payer: Medicaid Other | Source: Ambulatory Visit | Attending: Gastroenterology | Admitting: Gastroenterology

## 2022-01-15 ENCOUNTER — Other Ambulatory Visit: Payer: Self-pay

## 2022-01-15 DIAGNOSIS — K7030 Alcoholic cirrhosis of liver without ascites: Secondary | ICD-10-CM | POA: Insufficient documentation

## 2022-01-15 DIAGNOSIS — K766 Portal hypertension: Secondary | ICD-10-CM | POA: Diagnosis present

## 2022-01-15 DIAGNOSIS — K3189 Other diseases of stomach and duodenum: Secondary | ICD-10-CM | POA: Diagnosis present

## 2022-01-15 IMAGING — US US ABDOMEN LIMITED
1 series · 13 of 25 positions shown · non-contrast
Comparison: US Abdomen, [DATE]. CT AP, [DATE]. MR abdomen,
[DATE].

CLINICAL DATA: Cirrhosis

EXAM:
ULTRASOUND ABDOMEN LIMITED RIGHT UPPER QUADRANT

[Series 1: us abdomen limited · 13 of 56 slices shown]
[im 1/56]
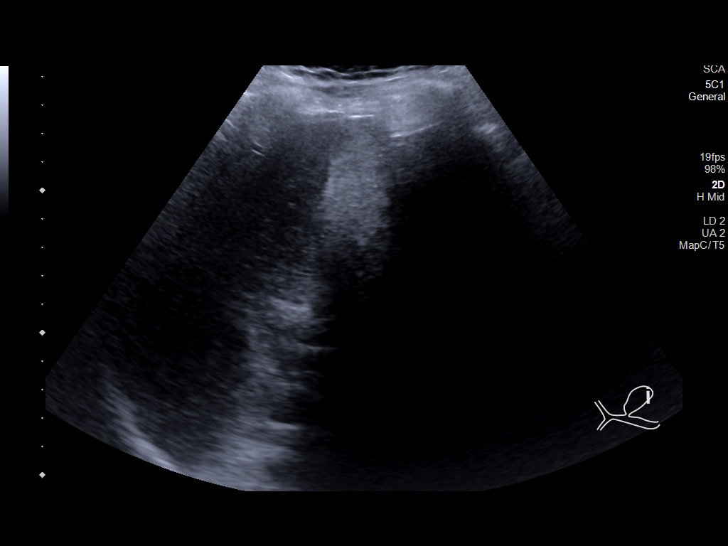
[im 5/56]
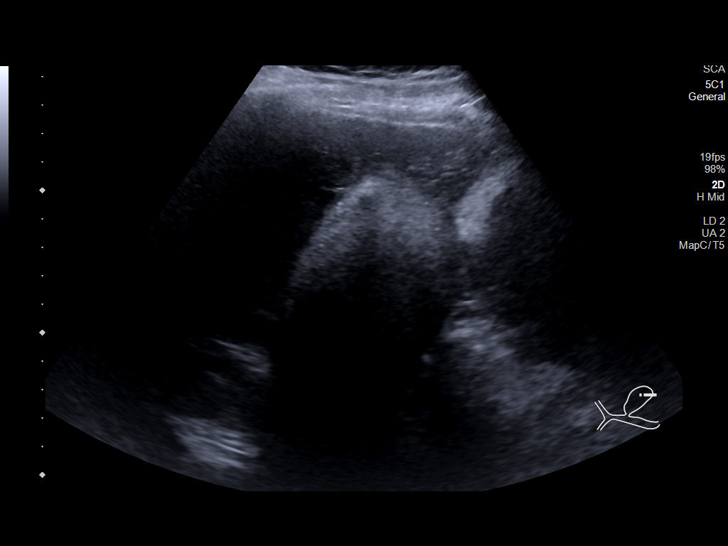
[im 10/56]
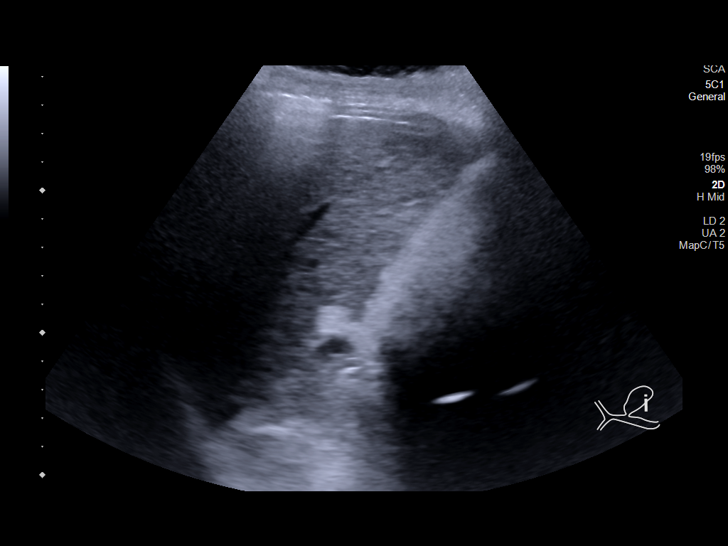
[im 14/56]
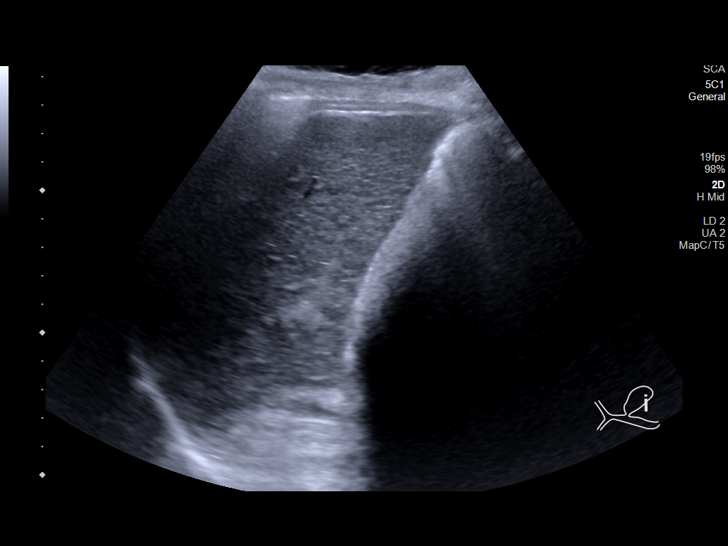
[im 19/56]
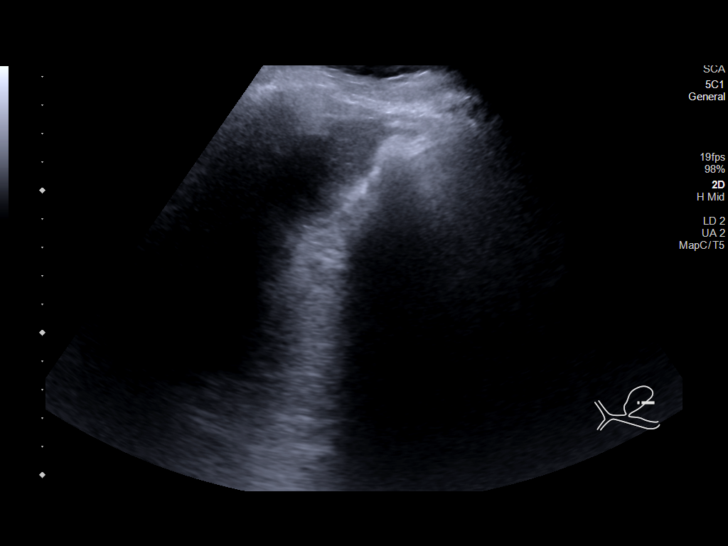
[im 23/56]
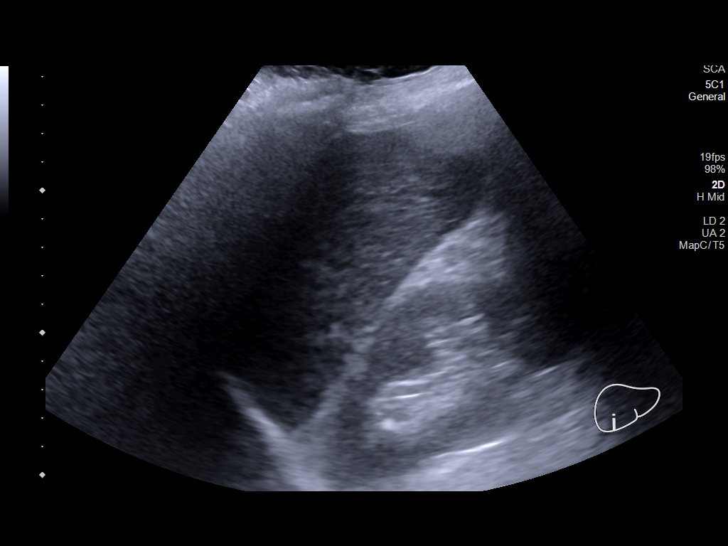
[im 28/56]
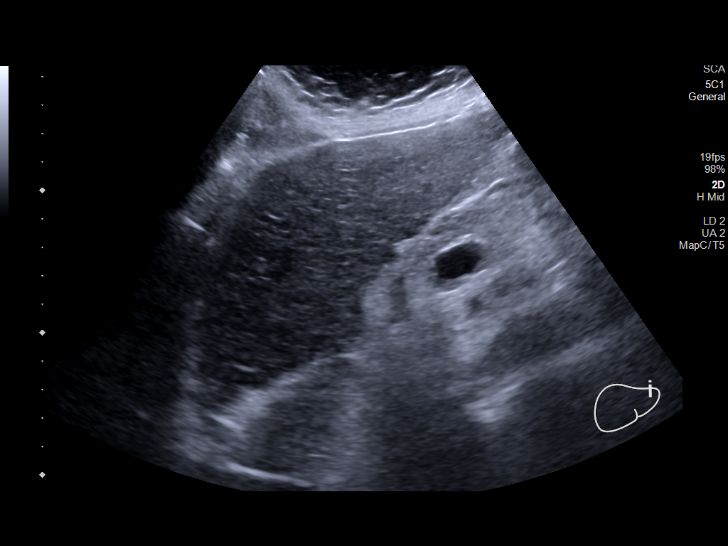
[im 33/56]
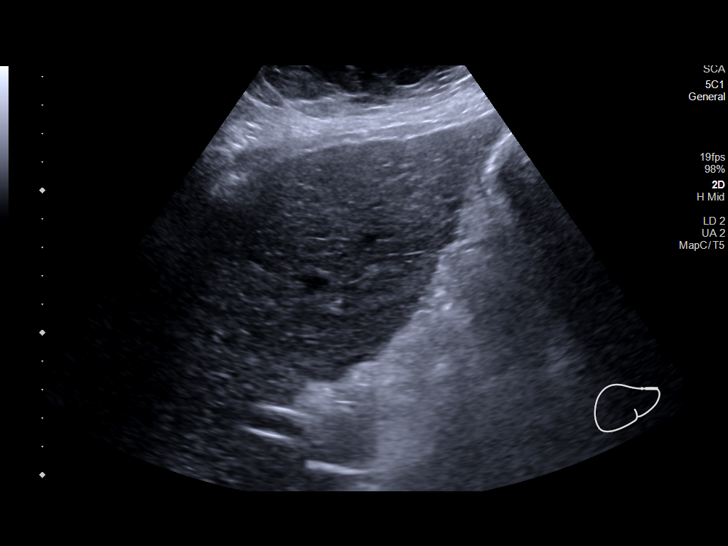
[im 37/56]
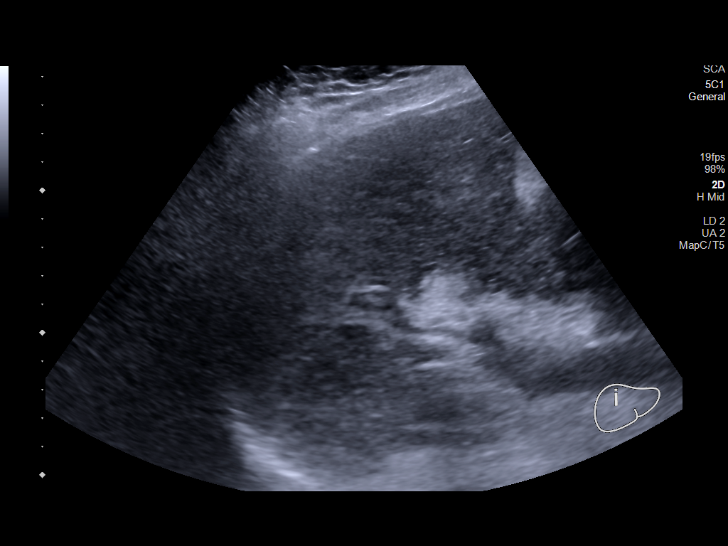
[im 42/56]
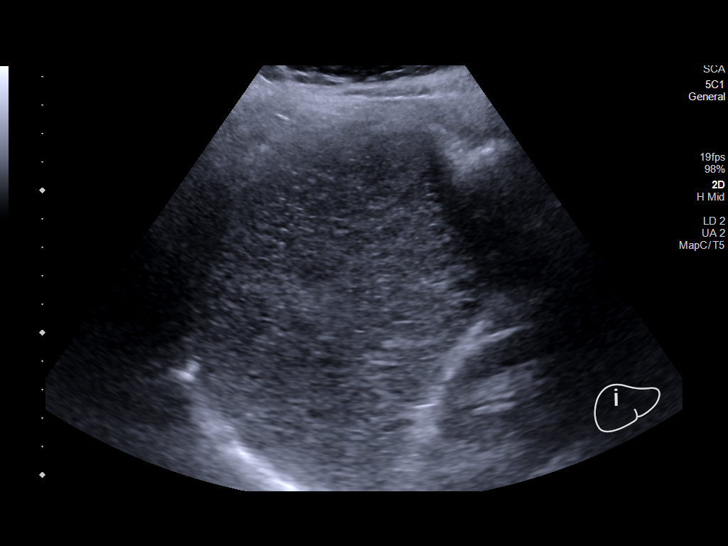
[im 46/56]
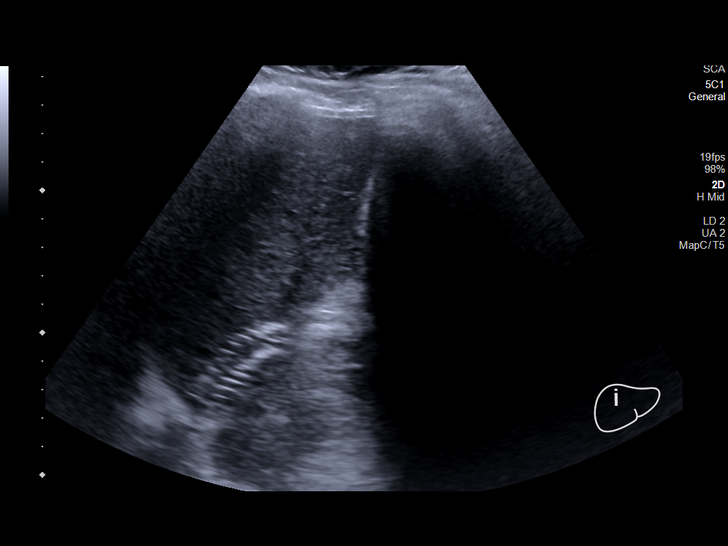
[im 51/56]
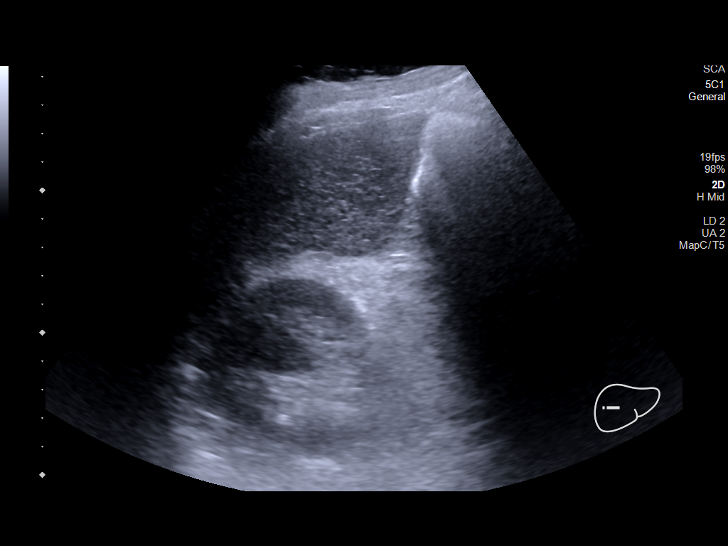
[im 56/56]
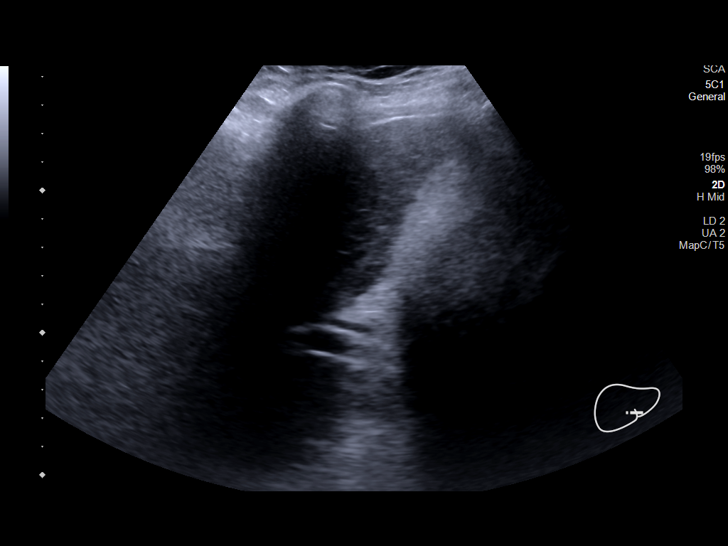

[13 of 25 positions shown; findings below may reference images not displayed]

FINDINGS: Gallbladder:

Multiple shadowing gallstones, with a "wall echo shadow" appearance.
See key image. Gallbladder wall is at the upper limit of normal
thickness at 0.4 cm. No sonographic Murphy sign noted by
sonographer.

Common bile duct:

Diameter: 0.3 cm

Liver:

No focal lesion identified. Increased hepatic echogenicity with a
coarse parenchymal echotexture. Nodular contour. Tips stent, appears
patent though is incompletely assessed. Portal vein is patent on
color Doppler imaging with normal direction of blood flow towards
the liver.

Other: No perihepatic ascites.
IMPRESSION: 1. Cirrhotic morphology of liver. No sonographic evidence of
hepatoma.
Continue surveillance with ultrasound every 6 months per [facility]ASLD
recommendation.
2. Cholelithiasis. Mild gallbladder wall thickening without
additional sonographic findings to suggest acute cholecystitis.
3. TIPS stent, appears patent though is incompletely assessed on
this evaluation. If further assessment is desired, consider
dedicated TIPS Doppler for further evaluation

## 2022-01-20 ENCOUNTER — Telehealth: Payer: Self-pay | Admitting: General Surgery

## 2022-01-20 DIAGNOSIS — K7581 Nonalcoholic steatohepatitis (NASH): Secondary | ICD-10-CM

## 2022-01-20 DIAGNOSIS — K7460 Unspecified cirrhosis of liver: Secondary | ICD-10-CM

## 2022-01-20 NOTE — Telephone Encounter (Signed)
Left a detailed voicemail regarding patients [place] results. Advised the pt. we will repeat in 6 months (07-14-22)  his [place] and AFP. Patient advised to call with any additional questions.

## 2022-01-20 NOTE — Telephone Encounter (Signed)
-----   Message from [place], DO sent at 01/19/2022  8:40 AM EST ----- RUQ ultrasound negative for hepatocellular carcinoma.  GB stones with mild wall thickening, but otherwise no other signs to suggest cholecystitis.  TIPS in place and patent.  Plan for repeat imaging RUQ [place] and AFP in 6 months for ongoing [place] screening.

## 2022-01-26 ENCOUNTER — Telehealth (HOSPITAL_BASED_OUTPATIENT_CLINIC_OR_DEPARTMENT_OTHER): Payer: Self-pay

## 2022-03-02 ENCOUNTER — Other Ambulatory Visit: Payer: Self-pay | Admitting: Gastroenterology

## 2022-03-03 ENCOUNTER — Telehealth: Payer: Self-pay | Admitting: Gastroenterology

## 2022-03-03 MED ORDER — CIPROFLOXACIN HCL 500 MG PO TABS: 750.0000 mg | ORAL_TABLET | ORAL | 2 refills | Status: DC

## 2022-03-03 NOTE — Telephone Encounter (Signed)
LVM for patient to call back. ? ?Script has been sent to local pharmacy ?

## 2022-03-03 NOTE — Telephone Encounter (Signed)
Patient called to follow up on the status of CIPRO refill.  ?

## 2022-04-23 ENCOUNTER — Other Ambulatory Visit: Payer: Self-pay | Admitting: Gastroenterology

## 2022-06-10 ENCOUNTER — Other Ambulatory Visit: Payer: Self-pay

## 2022-06-10 MED ORDER — FUROSEMIDE 40 MG PO TABS: 80.0000 mg | ORAL_TABLET | Freq: Every day | ORAL | 1 refills | Status: DC

## 2022-06-10 MED ORDER — POTASSIUM CHLORIDE CRYS ER 20 MEQ PO TBCR: 20.0000 meq | EXTENDED_RELEASE_TABLET | Freq: Every day | ORAL | 1 refills | Status: DC

## 2022-06-29 ENCOUNTER — Other Ambulatory Visit: Payer: Self-pay | Admitting: *Deleted

## 2022-06-29 ENCOUNTER — Other Ambulatory Visit: Payer: Self-pay | Admitting: Interventional Radiology

## 2022-06-29 DIAGNOSIS — Z95828 Presence of other vascular implants and grafts: Secondary | ICD-10-CM

## 2022-06-29 DIAGNOSIS — K7469 Other cirrhosis of liver: Secondary | ICD-10-CM

## 2022-07-08 LAB — CBC
HCT: 40.2 % (ref 38.5–50.0)
Hemoglobin: 14.4 g/dL (ref 13.2–17.1)
MCH: 34.5 pg — ABNORMAL HIGH (ref 27.0–33.0)
MCHC: 35.8 g/dL (ref 32.0–36.0)
MCV: 96.4 fL (ref 80.0–100.0)
MPV: 10.5 fL (ref 7.5–12.5)
Platelets: 104 10*3/uL — ABNORMAL LOW (ref 140–400)
RBC: 4.17 10*6/uL — ABNORMAL LOW (ref 4.20–5.80)
RDW: 14 % (ref 11.0–15.0)
WBC: 5.7 10*3/uL (ref 3.8–10.8)

## 2022-07-08 LAB — COMPLETE METABOLIC PANEL WITH GFR
AG Ratio: 1.4 (calc) (ref 1.0–2.5)
ALT: 25 U/L (ref 9–46)
AST: 29 U/L (ref 10–35)
Albumin: 4.1 g/dL (ref 3.6–5.1)
Alkaline phosphatase (APISO): 79 U/L (ref 35–144)
BUN: 13 mg/dL (ref 7–25)
CO2: 26 mmol/L (ref 20–32)
Calcium: 9.9 mg/dL (ref 8.6–10.3)
Chloride: 103 mmol/L (ref 98–110)
Creat: 0.78 mg/dL (ref 0.70–1.30)
Globulin: 2.9 g/dL (calc) (ref 1.9–3.7)
Glucose, Bld: 258 mg/dL — ABNORMAL HIGH (ref 65–99)
Potassium: 4.2 mmol/L (ref 3.5–5.3)
Sodium: 140 mmol/L (ref 135–146)
Total Bilirubin: 1.3 mg/dL — ABNORMAL HIGH (ref 0.2–1.2)
Total Protein: 7 g/dL (ref 6.1–8.1)
eGFR: 106 mL/min/{1.73_m2} (ref >=60)

## 2022-07-08 LAB — PROTIME-INR
INR: 1.1
Prothrombin Time: 11.1 s (ref 9.0–11.5)

## 2022-07-08 LAB — AFP TUMOR MARKER: AFP-Tumor Marker: 0.9 ng/mL (ref <6.1)

## 2022-07-28 ENCOUNTER — Ambulatory Visit
Admission: RE | Admit: 2022-07-28 | Discharge: 2022-07-28 | Disposition: A | Discharge status: Home / Self Care | Payer: PPO | Source: Ambulatory Visit | Attending: Interventional Radiology | Admitting: Interventional Radiology

## 2022-07-28 DIAGNOSIS — Z95828 Presence of other vascular implants and grafts: Secondary | ICD-10-CM

## 2022-07-28 HISTORY — PX: IR RADIOLOGIST EVAL & MGMT: IMG5224

## 2022-07-28 NOTE — Progress Notes (Signed)
Chief Complaint: Patient was seen in consultation today for TIPS follow-up at the request of Nashton Belson K  Referring Physician(s): Gerrit Heck, MD  History of Present Illness: Martin Strong is a 56 y.o. male with nonalcoholic steatohepatitis related cirrhosis initially diagnosed in February 9924 and complicated by bleeding esophageal varices in October 2019 which was successfully treated by endoscopic banding and propranolol for dual prophylaxis.  Additionally, patient began to develop decompensation and large-volume ascites in October 2020 requiring large-volume paracentesis with removal of 5 L of ascites and initiation of oral diuretic therapy.   He subsequently required additional paracentesis on 10/24/2019 with removal of 7 L of ascites, and again on 11/16/2019 with removal of 7 L of ascites.  He was initially referred to me in January to evaluate for possible TIPS creation.  At that time, we proceeded with standard work-up including echocardiography.  Unfortunately, in February he developed spontaneous bacterial peritonitis and acute kidney injury.  His diuretics were subsequently discontinued.  He has since recovered and is on ciprofloxacin 750 mg weekly for SBP prophylaxis.   He underwent elective TIPS creation on 03/12/2020. There was concern that there was no flow within the TIPS stent on his initial follow-up TIPS duplex ultrasound performed on 04/09/2020.  Therefore, he underwent TIPS revision on 04/11/2020.  Fortunately, the TIPS stent was patent.  However, at that time it was increased to 10 mm in diameter with fairly minimal change in his underlying portal pressure which remained elevated at greater than 12 mmHg.   Today, Martin Strong presents for follow-up evaluation.  His ascites remains completely resolved.  He has not had a paracentesis performed since June 2021.  He is extremely grateful for the success of the TIPS procedure.   Hepatic encephalopathy is currently  controlled with lactulose and Xifaxan.  Additionally, he reports that he remains on Lasix 80 mg and Aldactone 25 mg daily.  He continues to follow-up with Dr. Bryan Lemma of gastroenterology, nephrology and [facility].     Overall, he is quite pleased with his clinical result and is very happy to no longer require large-volume paracentesis.  He still has issues with relatively low energy.  He was found to have very low testosterone levels and is considering testosterone replacement therapy.  I encouraged him to look into this further.  I do not think it would have any deleterious effect on his TIPS.  Past Medical History:  Diagnosis Date   Anemia    Arthritis    Cirrhosis ([place])    Diabetes mellitus without complication ([place])    Dyspnea    Fatty liver    GERD (gastroesophageal reflux disease)    GI bleed    Hyperlipidemia    Hypertension     Past Surgical History:  Procedure Laterality Date   BIOPSY  02/14/2019   Procedure: BIOPSY;  Surgeon: Lavena Bullion, DO;  Location: [facility];  Service: Gastroenterology;;   BIOPSY  07/22/2019   Procedure: BIOPSY;  Surgeon: Lavena Bullion, DO;  Location: [facility];  Service: Gastroenterology;;   ESOPHAGEAL BANDING  10/07/2018   Procedure: ESOPHAGEAL BANDING;  Surgeon: Lavena Bullion, DO;  Location: [place];  Service: Gastroenterology;;   ESOPHAGEAL BANDING N/A 01/03/2019   Procedure: ESOPHAGEAL BANDING;  Surgeon: Lavena Bullion, DO;  Location: [facility];  Service: Gastroenterology;  Laterality: N/A;   ESOPHAGEAL BANDING  02/14/2019   Procedure: ESOPHAGEAL BANDING;  Surgeon: Lavena Bullion, DO;  Location: [facility];  Service: Gastroenterology;;  esophageal bands     ESOPHAGOGASTRODUODENOSCOPY Left 07/22/2019   Procedure: ESOPHAGOGASTRODUODENOSCOPY (EGD) WITH POSSIBLE BANDING;  Surgeon: Lavena Bullion, DO;  [facility]: [facility];  Service: Gastroenterology;  Laterality: Left;   ESOPHAGOGASTRODUODENOSCOPY  (EGD) WITH PROPOFOL N/A 10/07/2018   Procedure: ESOPHAGOGASTRODUODENOSCOPY (EGD) WITH PROPOFOL;  Surgeon: Lavena Bullion, DO;  Location: [place];  Service: Gastroenterology;  Laterality: N/A;   ESOPHAGOGASTRODUODENOSCOPY (EGD) WITH PROPOFOL N/A 01/03/2019   Procedure: ESOPHAGOGASTRODUODENOSCOPY (EGD) WITH PROPOFOL;  Surgeon: Lavena Bullion, DO;  [facility];  Service: Gastroenterology;  Laterality: N/A;   ESOPHAGOGASTRODUODENOSCOPY (EGD) WITH PROPOFOL N/A 02/14/2019   Procedure: ESOPHAGOGASTRODUODENOSCOPY (EGD) WITH PROPOFOL;  Surgeon: Lavena Bullion, DO;  [facility];  Service: Gastroenterology;  Laterality: N/A;   IR PARACENTESIS  10/05/2019   IR PARACENTESIS  10/24/2019   IR PARACENTESIS  11/16/2019   IR PARACENTESIS  12/25/2019   IR PARACENTESIS  01/05/2020   IR PARACENTESIS  01/23/2020   IR PARACENTESIS  02/08/2020   IR PARACENTESIS  02/16/2020   IR PARACENTESIS  02/28/2020   IR PARACENTESIS  03/12/2020   IR PARACENTESIS  04/04/2020   IR PARACENTESIS  04/11/2020   IR PARACENTESIS  04/24/2020   IR PARACENTESIS  05/03/2020   IR PARACENTESIS  05/14/2020   IR RADIOLOGIST EVAL & MGMT  12/19/2019   IR RADIOLOGIST EVAL & MGMT  02/27/2020   IR RADIOLOGIST EVAL & MGMT  04/09/2020   IR RADIOLOGIST EVAL & MGMT  05/09/2020   IR RADIOLOGIST EVAL & MGMT  01/30/2021   IR RADIOLOGIST EVAL & MGMT  08/06/2021   IR TIPS  03/12/2020   IR TIPS REVISION MOD SED  04/11/2020   RADIOLOGY WITH ANESTHESIA N/A 03/12/2020   Procedure: TIPS;  Surgeon: Jacqulynn Cadet, MD;  Location: [place];  Service: Radiology;  Laterality: N/A;    Allergies: Nadolol  Medications: Prior to Admission medications   Medication Sig Start Date End Date Taking? Authorizing Provider  cholecalciferol (VITAMIN D) 1000 units tablet Take 1,000 Units by mouth daily.     [provider]  ciprofloxacin (CIPRO) 500 MG tablet Take 1.5 tablets (750 mg total) by mouth once a week. 03/03/22   Cirigliano, Vito V, DO   dapagliflozin propanediol (FARXIGA) 10 MG TABS tablet Take 10 mg by mouth at bedtime.    [provider]  feeding supplement, ENSURE ENLIVE, (ENSURE ENLIVE) LIQD Take 237 mLs by mouth 2 (two) times daily between meals. 01/28/20   Hosie Poisson, MD  ferrous sulfate (KP FERROUS SULFATE) 325 (65 FE) MG tablet Take 1 tablet (325 mg total) by mouth daily with breakfast. Patient taking differently: Take 325 mg by mouth daily. 05/30/19   Cirigliano, Vito V, DO  furosemide (LASIX) 40 MG tablet Take 2 tablets (80 mg total) by mouth daily. 06/10/22   Cirigliano, Vito V, DO  glipiZIDE (GLUCOTROL XL) 10 MG 24 hr tablet Take 10 mg by mouth 2 (two) times daily.    [provider]  lactulose (CHRONULAC) 10 GM/15ML solution Take 30 mLs (20 g total) by mouth 3 (three) times daily. 03/13/20   Ardis Rowan, PA-C  levOCARNitine (CARNITOR) 330 MG tablet Take 330 mg by mouth 3 (three) times daily.    [provider]  metFORMIN (GLUCOPHAGE) 1000 MG tablet Take 1,000 mg by mouth 2 (two) times daily with a meal.    [provider]  Multiple Vitamin (MULTIVITAMIN WITH MINERALS) TABS tablet Take 1 tablet by mouth at bedtime.    [provider]  Multiple Vitamins-Minerals (ZINC PO) Take 1 tablet by mouth daily.    [provider]  potassium chloride SA (KLOR-CON M) 20 MEQ tablet Take 1 tablet (20 mEq total) by mouth daily. 06/10/22   Cirigliano, Vito V, DO  propranolol (INDERAL) 40 MG tablet Take 0.5 tablets (20 mg total) by mouth 2 (two) times daily. Patient taking differently: Take 40 mg by mouth 2 (two) times daily. 01/27/20   Hosie Poisson, MD  spironolactone (ALDACTONE) 25 MG tablet TAKE 1 TABLET BY MOUTH DAILY 09/22/21   Cirigliano, Vito V, DO  XIFAXAN 550 MG TABS tablet TAKE 1 TABLET BY MOUTH TWICE DAILY 04/23/22   Cirigliano, Dominic Pea, DO     Family History  Problem Relation Age of Onset   Diabetes Other    Esophageal cancer Father    Colon cancer Neg Hx     Rectal cancer Neg Hx    Stomach cancer Neg Hx     Social History   Socioeconomic History   Marital status: Married    Spouse name: Not on file   Number of children: 2   Years of education: Not on file   Highest education level: Not on file  Occupational History   Not on file  Tobacco Use   Smoking status: Never   Smokeless tobacco: Never  Vaping Use   Vaping Use: Never used  Substance and Sexual Activity   Alcohol use: Not Currently   Drug use: Never   Sexual activity: Not on file  Other Topics Concern   Not on file  Social History Narrative   Not on file   Social Determinants of Health   Financial Resource Strain: Not on file  Food Insecurity: Not on file  Transportation Needs: Not on file  Physical Activity: Not on file  Stress: Not on file  Social Connections: Not on file    Review of Systems: A 12 point ROS discussed and pertinent positives are indicated in the HPI above.  All other systems are negative.  Review of Systems  Vital Signs: There were no vitals taken for this visit.   Physical Exam Constitutional:      Appearance: Normal appearance.  HENT:     Head: Normocephalic and atraumatic.  Eyes:     General: No scleral icterus. Cardiovascular:     Rate and Rhythm: Normal rate.  Pulmonary:     Effort: Pulmonary effort is normal.  Abdominal:     General: Abdomen is flat. There is no distension.     Palpations: Abdomen is soft.     Tenderness: There is no abdominal tenderness. There is no guarding.  Skin:    General: Skin is warm and dry.  Neurological:     Mental Status: He is alert and oriented to person, place, and time.  Psychiatric:        Mood and Affect: Mood normal.        Behavior: Behavior normal.     Imaging: No results found.  Labs:  CBC: Recent Labs    07/06/22 0740  WBC 5.7  HGB 14.4  HCT 40.2  PLT 104*    COAGS: Recent Labs    07/06/22 0740  INR 1.1    BMP: Recent Labs    07/06/22 0740  NA 140  K 4.2   CL 103  CO2 26  GLUCOSE 258*  BUN 13  CALCIUM 9.9  CREATININE 0.78    LIVER FUNCTION TESTS: Recent Labs    07/06/22 0740  BILITOT 1.3*  AST 29  ALT 25  PROT 7.0    TUMOR MARKERS: Recent Labs    07/06/22 0740  AFPTM 0.9    Assessment and Plan:  Martin Strong is doing extremely well clinically.  He has not had symptomatic ascites now for more than 2 years.  His last paracentesis was June 2021. Additionally, since he has begun rifaximin he has had no further issues with symptomatic hepatic encephalopathy.  His TIPS has been a success.  Surveillance imaging today demonstrates no issues.   1.)  Continue routine TIPS surveillance.  Next TIPS duplex ultrasound with liver labs and accompanying clinic visit in 1 year.    Electronically Signed: Criselda Peaches 07/28/2022, 11:35 AM   I spent a total of  15 Minutes in face to face in clinical consultation, greater than 50% of which was counseling/coordinating care for NASH cirrhosis complicated by ascites now post TIPS creation.

## 2022-08-13 ENCOUNTER — Telehealth: Payer: Self-pay

## 2022-08-13 NOTE — Telephone Encounter (Signed)
Received PA approval for XIFAXAN 550 mg tablet from [employer] starting from 08/12/22 to 08/13/23.

## 2022-08-19 ENCOUNTER — Telehealth: Payer: Self-pay | Admitting: Gastroenterology

## 2022-08-19 NOTE — Telephone Encounter (Signed)
Patient calling wanting to get samples of XIFAXAN. Please advise.

## 2022-08-20 NOTE — Telephone Encounter (Signed)
Hi Monchell,  Will you please initiate PA for Xifaxan for Mr. Szatkowski? His insurance is recently changed from [facility] to [facility].   ID: [id number] Group : [id number] BIN: [id number] PCN - [id number] Phone No: [phone]  Thank you.

## 2022-08-20 NOTE — Telephone Encounter (Signed)
Hi Monchell,   Will you please initiate PA for Xifaxan for Mr. Magallon? His insurance is recently changed from [facility] to [facility].    ID: [id number] Group : [id number] BIN: [id number] PCN - [id number] Phone No: [phone]   Thank you.

## 2022-08-20 NOTE — Telephone Encounter (Signed)
Spoke with the patient. Informed him that we do not have Xifaxan sample at this time. Informed him we already faxed our part to [facility] Patient Assistance Program and confirmed with Margaretha Sheffield, patient advocate. I informed the patient they haven't received his income documentation yet to process his application. Stated he already faxed to them two days ago. Advised patient to contact them and refax his paperwork if necessary. I offered him we can fax his documents if he would like. Stated he would do it.  Patient was paying $4:00 copay with his old insurance, [employer]. Now with his new insurance [employer], his copay is $978.00. Informed the patient that we will initiate a new Prior Auth. Obtained his new insurance information to send it to our Prior Auth Team.   ID: [id number] Group : [id number] BIN: [id number] PCN - [id number] Phone No: [phone]  All questions were answered at the end of the call. His new insurance information is sent to [facility] to initiate PA for Xifaxan.

## 2022-08-21 ENCOUNTER — Telehealth: Payer: Self-pay | Admitting: Pharmacy Technician

## 2022-08-21 ENCOUNTER — Other Ambulatory Visit (HOSPITAL_COMMUNITY): Payer: Self-pay

## 2022-08-21 NOTE — Telephone Encounter (Signed)
PA has been submitted and telephone encounter has been created

## 2022-08-21 NOTE — Telephone Encounter (Signed)
Patient Advocate Encounter  Prior Authorization for Xifaxan 576m tablets has been approved.    Effective: 08-21-2022 to 12-13-2022

## 2022-08-21 NOTE — Telephone Encounter (Signed)
Patient Advocate Encounter  Received notification that prior authorization for Doreene Nest is required.   PA submitted on 9.8.23 Key [id number] Status is pending    Luciano Cutter, CPhT Patient Advocate Phone: [phone]

## 2022-09-01 NOTE — Telephone Encounter (Signed)
I called patient assistance program to follow up on application of Xifaxan for the patient.  Rep informed me that they are still waiting for THE copy of patient's medicare card, part D card, and co-pay of test claim from the pharmacy before they decide on his application. Patient made aware. Informed him that I already obtained copay test claim from the pharmacy and faxed it to the [facility]). Stated he is going to fax the rest to them. Patient verbalized understanding.

## 2022-09-09 NOTE — Telephone Encounter (Signed)
Received Xifaxan approval letter from [place] assistance program. Patient is eligible to receive medication at no cost until December 13, 2022.

## 2022-10-02 ENCOUNTER — Ambulatory Visit: Payer: Medicare PPO | Admitting: Gastroenterology

## 2022-10-02 ENCOUNTER — Other Ambulatory Visit (HOSPITAL_COMMUNITY): Payer: Self-pay

## 2022-10-02 ENCOUNTER — Encounter: Payer: Self-pay | Admitting: Gastroenterology

## 2022-10-02 VITALS — BP 130/72 | HR 84 | Ht 72.0 in | Wt 212.1 lb

## 2022-10-02 DIAGNOSIS — K7581 Nonalcoholic steatohepatitis (NASH): Secondary | ICD-10-CM

## 2022-10-02 DIAGNOSIS — R112 Nausea with vomiting, unspecified: Secondary | ICD-10-CM | POA: Diagnosis not present

## 2022-10-02 DIAGNOSIS — R1310 Dysphagia, unspecified: Secondary | ICD-10-CM

## 2022-10-02 DIAGNOSIS — K7460 Unspecified cirrhosis of liver: Secondary | ICD-10-CM

## 2022-10-02 DIAGNOSIS — R1013 Epigastric pain: Secondary | ICD-10-CM | POA: Diagnosis not present

## 2022-10-02 NOTE — Patient Instructions (Signed)
You have been scheduled for a modified barium swallow on 10/16/22 at 100 pm at [facility]. Please arrive 15 minutes prior to your test for registration. You will go to  Radiology (1st Floor) for your appointment. Should you need to cancel or reschedule your appointment, please contact [phone] [facility] [place]) or [phone] [place]). _____________________________________________________________________ A Modified Barium Swallow Study, or MBS, is a special x-ray that is taken to check swallowing skills. It is carried out by a Stage manager and a Psychologist, clinical (SLP). During this test, yourmouth, throat, and esophagus, a muscular tube which connects your mouth to your stomach, is checked. The test will help you, your doctor, and the SLP plan what types of foods and liquids are easier for you to swallow. The SLP will also identify positions and ways to help you swallow more easily and safely. What will happen during an MBS? You will be taken to an x-ray room and seated comfortably. You will be asked to swallow small amounts of food and liquid mixed with barium. Barium is a liquid or paste that allows images of your mouth, throat and esophagus to be seen on x-ray. The x-ray captures moving images of the food you are swallowing as it travels from your mouth through your throat and into your esophagus. This test helps identify whether food or liquid is entering your lungs (aspiration). The test also shows which part of your mouth or throat lacks strength or coordination to move the food or liquid in the right direction. This test typically takes 30 minutes to 1 hour to complete. _______________________________________________________________________   Dennis Bast have been scheduled for an endoscopy. Please follow written instructions given to you at your visit today. If you use inhalers (even only as needed), please bring them with you on the day of your procedure.  (even only as needed), please  bring them with you on the day of your procedure.    Thank you for choosing me and [facility].  Vito Cirigliano, D.O.

## 2022-10-02 NOTE — Progress Notes (Unsigned)
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  Was previously followed by GI in [place], [place].  Cirrhosis complicated by esophageal varices with bleeding requiring multiple EGDs with banding in the past along with propranolol for dual prophylaxis.  Developed large volume ascites in 09/2019.  Previously diuretic intolerant, required serial large-volume paracentesis, TIPS placement with subsequent revision, and low-sodium diet, now back on diuretics. Admitted with SBP and AKI in 01/2020.  Admitted 04/2020 with hepatic encephalopathy-started lactulose/rifaximin.   Follows with [facility] at [facility]  MELD currently too low for liver transplant, and does not currently have candidate for living donor transplant.   Cirrhosis Hx: - Etiology: NASH - Diagnosed 2017 - Liver bx: None - Complications: EV with admission for bleeding 09/2018 treated with EGD with EVL, large volume ascites serial LVP starting 09/2019 TIPS 02/2020 (revision 03/2020), SBP 01/2020, hepatic encephalopathy 04/2020, sarcopenia -Hepatic Rx: Lactulose, rifaximin, Lasix 20 mg/day, Aldactone 25 mg/day, Cipro 750 mg/week, levocarnitine -Previous hepatic Rx: Propranolol (dual prophy) and PPI discontinued due to SBP.  History of diuretic intolerance and AKI.  Follows with Nephrology, managing Lasix/Aldactone - [place] Screening: UTD -03/13/2020: TIPS procedure -04/11/2020: TIPS revision -05/09/2020: Patent TIPS with high velocity flow throughout - Annual lab check: Completed 08/2021 (except INR was not checked) - HAV and HBV vaccine series completed - MELD: 9 (fluctuates 9-14) - Child Pugh: A   Endoscopic history: - EGD x3 in 2017 in [place], [place]  for esophageal varices with banding - EGD (09/2018, Dr. Bryan Lemma): Grade 2 esophageal varices with stigmata of recent bleeding requiring banding x2 with complete eradication, portal hypertensive gastropathy without bleeding. - EGD (12/2018, Dr. Bryan Lemma): Grade II esophageal varices, 6 bands placed with deflation. Normal Z line at 46 cm, severe portal HTN gastropathy with contact oozing, small GOV1, normal duodenum. H pylori serologies negative -EGD (02/2019, Dr. Bryan Lemma): Grade 2 esophageal varices, 4 bands placed with complete eradication.  Portal hypertensive gastropathy/2 adenopathy -EGD (07/2019, Dr. Bryan Lemma): Done for dysphagia. grade 1 esophageal varices, multiple post variceal banding scars in the lower third with 1 area of luminal deformity, likely consistent with prior deep banding site.  The lumen was mildly narrowed, but easily traversed and no additional endoscopic dilation performed.  Mild esophagitis at the GEJ, portal hypertensive gastropathy, peptic antritis/duodenitis.  Papilla prominent.  HPI:     Patient is a 56 y.o. male presenting to the Gastroenterology Clinic for evaluation of dysphagia, abdominal pain, nausea.  Last seen by me on 11/27/2021 for continued management of cirrhosis as outlined above.  He additionally follows with [facility], last seen on 09/04/2022.  Decreased Lasix/spironolactone with plan to titrate off since he has functioning TIPS in place; follows every 6 months.  Today, his main issue is recent onset dysphagia. Points to anterior neck and suprasternal notch. Been intermittent for last year. Worse with bread and other solids, and has had to regurgitate back out. No issue with liquids alone. Sxs occur 3 times/week now.   Separately with occasional pain in MEG and LUQ with associated nausea. No vomiting. Sxs present for years, and no worse recently, but causing him more worry.   Review of systems:     No chest pain, no SOB, no fevers, no  urinary sx    Past Medical History:  Diagnosis Date   Anemia    Arthritis    Cirrhosis ([place])    Diabetes mellitus without complication ([place])    Dyspnea    Fatty liver    GERD (gastroesophageal reflux disease)    GI bleed    Hyperlipidemia    Hypertension     Patient's surgical history, family medical history, social history, medications and allergies were all reviewed in Epic    Current Outpatient Medications  Medication Sig Dispense Refill   cholecalciferol (VITAMIN D) 1000 units tablet Take 1,000 Units by mouth daily.      ciprofloxacin (CIPRO) 500 MG tablet Take 1.5 tablets (750 mg total) by mouth once a week. 20 tablet 2   dapagliflozin propanediol (FARXIGA) 10 MG TABS tablet Take 10 mg by mouth at bedtime.     feeding supplement, ENSURE ENLIVE, (ENSURE ENLIVE) LIQD Take 237 mLs by mouth 2 (two) times daily between meals. 237 mL 12   ferrous sulfate (KP FERROUS SULFATE) 325 (65 FE) MG tablet Take 1 tablet (325 mg total) by mouth daily with breakfast. (Patient taking differently: Take 325 mg by mouth daily.) 30 tablet 3   furosemide (LASIX) 40 MG tablet Take 2 tablets (80 mg total) by mouth daily. 180 tablet 1   glipiZIDE (GLUCOTROL XL) 10 MG 24 hr tablet Take 10 mg by mouth 2 (two) times daily.     lactulose (CHRONULAC) 10 GM/15ML solution Take 30 mLs (20 g total) by mouth 3 (three) times daily. 236 mL 0   levOCARNitine (CARNITOR) 330 MG tablet Take 330 mg by mouth 3 (three) times daily.     metFORMIN (GLUCOPHAGE) 1000 MG tablet Take 1,000 mg by mouth 2 (two) times daily with a meal.     MOUNJARO 12.5 MG/0.5ML Pen Inject into the skin.     Multiple Vitamin (MULTIVITAMIN WITH MINERALS) TABS tablet Take 1 tablet by mouth at bedtime.     Multiple Vitamins-Minerals (ZINC PO) Take 1 tablet by mouth daily.     potassium chloride SA (KLOR-CON M) 20 MEQ tablet Take 1 tablet (20 mEq total) by mouth daily. 90 tablet 1   propranolol (INDERAL) 40 MG tablet Take 0.5 tablets (20 mg total) by mouth 2  (two) times daily. (Patient taking differently: Take 40 mg by mouth 2 (two) times daily.) 60 tablet 0   spironolactone (ALDACTONE) 25 MG tablet TAKE 1 TABLET BY MOUTH DAILY 90 tablet 5   XIFAXAN 550 MG TABS tablet TAKE 1 TABLET BY MOUTH TWICE DAILY 60 tablet 5   No current facility-administered medications for this visit.    Physical Exam:     BP 130/72   Pulse 84   Ht 6' (1.829 m)   Wt 212 lb 2 oz (96.2 kg)   BMI 28.77 kg/m   GENERAL:  Pleasant male in NAD PSYCH: : Cooperative, normal affect EENT:  conjunctiva pink, mucous membranes moist, neck supple without masses CARDIAC:  RRR, no murmur heard, no peripheral edema PULM: Normal respiratory effort, lungs CTA bilaterally, no wheezing ABDOMEN:  Nondistended, soft, nontender. No obvious masses, no hepatomegaly,  normal bowel sounds SKIN:  turgor, no lesions seen Musculoskeletal:  Normal muscle tone, normal strength NEURO: Alert and oriented x 3, no focal neurologic deficits   IMPRESSION and PLAN:    1) NASH Cirrhosis- MELD 9 2) Ascites-resolved with TIPS 3) SBP 01/2020 4) Esophageal varices-resolved with EVL and TIPS 5) Portal hypertensive gastropathy 6) Sarcopenia-improving 7) Hepatic encephalopathy-controlled 8)  Zinc deficiency   - No need for surveillance endoscopy given functioning TIPS - Continue lactulose and rifaximin - Continue zinc and levocarnitine - Hepatology Clinic has reduced his diuretics with the hopes of potentially stopping given functioning TIPS and lack of ascites on repeat imaging  - Continue follow-up in [place] every 6 months as directed - UTD on hepatoma screening; being coordinated through [facility] - PPI previously discontinued due to SBP history - Continue low-sodium diet - UTD on vaccines - Continue branched chain amino acids for sarcopenia - Previously discussed possibility of stopping Cipro (prescribed 750 mg/week for SBP prophylaxis) due to lack of ascites with  functioning TIPS along with some data to support some prophylactic benefit of rifaximin.  However, he is quite hesitant due to prior history of SBP, concerns about repeat infection, etc.    9) Dysphagia - Modified barium swallow - EGD to evaluate for mucosal/luminal pathology to include stricture, luminal narrowing, scarring from prior EVL.  10) LUQ pain/Epigastric pain 11) Nausea - Evaluate for medical/luminal pathology time EGD as above - Continue p.o. intake as tolerated  12) Diabetes - Continue close follow-up with PC[facility] for continued management of diabetes with elevated hemoglobin A1c - On Mounjaro.  More recent data demonstrates potential improvement with GLP-1 meds in NASH       The indications, risks, and benefits of EGD were explained to the patient in detail. Risks include but are not limited to bleeding, perforation, adverse reaction to medications, and cardiopulmonary compromise. Sequelae include but are not limited to the possibility of surgery, hospitalization, and mortality. The patient verbalized understanding and wished to proceed. All questions answered, referred to scheduler. Further recommendations pending results of the exam.     Martin Strong ,DO, FACG 10/02/2022, 9:29 AM

## 2022-10-16 ENCOUNTER — Encounter (HOSPITAL_COMMUNITY): Payer: PPO

## 2022-10-16 ENCOUNTER — Encounter (HOSPITAL_COMMUNITY): Payer: Self-pay

## 2022-10-16 ENCOUNTER — Ambulatory Visit (HOSPITAL_COMMUNITY): Payer: PPO

## 2022-10-22 ENCOUNTER — Encounter (HOSPITAL_COMMUNITY): Payer: Medicare PPO

## 2022-10-22 ENCOUNTER — Ambulatory Visit (HOSPITAL_COMMUNITY): Payer: Medicare PPO

## 2022-11-01 ENCOUNTER — Encounter: Payer: Self-pay | Admitting: Certified Registered Nurse Anesthetist

## 2022-11-09 ENCOUNTER — Encounter: Payer: Self-pay | Admitting: Gastroenterology

## 2022-11-09 ENCOUNTER — Ambulatory Visit (AMBULATORY_SURGERY_CENTER): Payer: Medicare PPO | Admitting: Gastroenterology

## 2022-11-09 VITALS — BP 114/65 | HR 75 | Temp 97.8°F | Resp 12 | Ht 72.0 in | Wt 212.0 lb

## 2022-11-09 DIAGNOSIS — R1310 Dysphagia, unspecified: Secondary | ICD-10-CM

## 2022-11-09 DIAGNOSIS — K2990 Gastroduodenitis, unspecified, without bleeding: Secondary | ICD-10-CM

## 2022-11-09 DIAGNOSIS — R1013 Epigastric pain: Secondary | ICD-10-CM

## 2022-11-09 DIAGNOSIS — K2970 Gastritis, unspecified, without bleeding: Secondary | ICD-10-CM | POA: Diagnosis not present

## 2022-11-09 MED ORDER — FAMOTIDINE 20 MG PO TABS: 20.0000 mg | ORAL_TABLET | Freq: Every day | ORAL | 0 refills | Status: DC

## 2022-11-09 MED ORDER — SODIUM CHLORIDE 0.9 % IV SOLN: 500.0000 mL | Freq: Once | INTRAVENOUS | Status: DC

## 2022-11-09 NOTE — Progress Notes (Signed)
0909 Robinul 0.1 mg IV given due large amount of secretions upon assessment.  MD made aware, vss

## 2022-11-09 NOTE — Patient Instructions (Addendum)
Handout on gastritis given to patient. Await pathology results. Resume previous diet and continue present medications. Proceed with MBS as previously ordered. If MBS unremarkable, and if dysphagia continues, will plan for esophagram. Start taking Pepcid 20 mg daily for 4 weeks - pick up from [place]:   Refer to the procedure report that was given to you for any specific questions about what was found during the examination.  If the procedure report does not answer your questions, please call your gastroenterologist to clarify.  If you requested that your care partner not be given the details of your procedure findings, then the procedure report has been included in a sealed envelope for you to review at your convenience later.  YOU SHOULD EXPECT: Some feelings of bloating in the abdomen. Passage of more gas than usual.  Walking can help get rid of the air that was put into your GI tract during the procedure and reduce the bloating. If you had a lower endoscopy (such as a colonoscopy or flexible sigmoidoscopy) you may notice spotting of blood in your stool or on the toilet paper. If you underwent a bowel prep for your procedure, you may not have a normal bowel movement for a few days.  Please Note:  You might notice some irritation and congestion in your nose or some drainage.  This is from the oxygen used during your procedure.  There is no need for concern and it should clear up in a day or so.  SYMPTOMS TO REPORT IMMEDIATELY:  Following upper endoscopy (EGD)  Vomiting of blood or coffee ground material  New chest pain or pain under the shoulder blades  Painful or persistently difficult swallowing  New shortness of breath  Fever of 100F or higher  Black, tarry-looking stools  For urgent or emergent issues, a gastroenterologist can be reached at any hour by calling [phone]. Do not use MyChart messaging for  urgent concerns.    DIET:  We do recommend a small meal at first, but then you may proceed to your regular diet.  Drink plenty of fluids but you should avoid alcoholic beverages for 24 hours.  ACTIVITY:  You should plan to take it easy for the rest of today and you should NOT DRIVE or use heavy machinery until tomorrow (because of the sedation medicines used during the test).    FOLLOW UP: Our staff will call the number listed on your records the next business day following your procedure.  We will call around 7:15- 8:00 am to check on you and address any questions or concerns that you may have regarding the information given to you following your procedure. If we do not reach you, we will leave a message.     If any biopsies were taken you will be contacted by phone or by letter within the next 1-3 weeks.  Please call us at [phone] if you have not heard about the biopsies in 3 weeks.    SIGNATURES/CONFIDENTIALITY: You and/or your care partner have signed paperwork which will be entered into your electronic medical record.  These signatures attest to the fact that that the information above on your After Visit Summary has been reviewed and is understood.  Full responsibility of the confidentiality of this discharge information lies with you and/or your care-partner.

## 2022-11-09 NOTE — Progress Notes (Signed)
GASTROENTEROLOGY PROCEDURE H&P NOTE   Primary Care Physician: Rhea Bleacher, NP    Reason for Procedure:   Dysphagia, nausea, abdominal pain  Plan:    EGD with possible dilation and/or bxs  Patient is appropriate for endoscopic procedure(s) in the ambulatory ([place]) setting.  The nature of the procedure, as well as the risks, benefits, and alternatives were carefully and thoroughly reviewed with the patient. Ample time for discussion and questions allowed. The patient understood, was satisfied, and agreed to proceed.     HPI: Martin Strong is a 56 y.o. male who presents for EGD for evaluation of dysphagia, MEG/LUQ pain, and nausea.  Past Medical History:  Diagnosis Date   Anemia    Arthritis    Cirrhosis ([place])    Diabetes mellitus without complication ([place])    Dyspnea    Fatty liver    GERD (gastroesophageal reflux disease)    GI bleed    Hyperlipidemia    Hypertension     Past Surgical History:  Procedure Laterality Date   BIOPSY  02/14/2019   Procedure: BIOPSY;  Surgeon: Lavena Bullion, DO;  Location: [facility];  Service: Gastroenterology;;   BIOPSY  07/22/2019   Procedure: BIOPSY;  Surgeon: Lavena Bullion, DO;  Location: [facility];  Service: Gastroenterology;;   ESOPHAGEAL BANDING  10/07/2018   Procedure: ESOPHAGEAL BANDING;  Surgeon: Lavena Bullion, DO;  Location: [facility];  Service: Gastroenterology;;   ESOPHAGEAL BANDING N/A 01/03/2019   Procedure: ESOPHAGEAL BANDING;  Surgeon: Lavena Bullion, DO;  Location: [facility];  Service: Gastroenterology;  Laterality: N/A;   ESOPHAGEAL BANDING  02/14/2019   Procedure: ESOPHAGEAL BANDING;  Surgeon: Lavena Bullion, DO;  Location: [facility];  Service: Gastroenterology;;   esophageal bands     ESOPHAGOGASTRODUODENOSCOPY Left 07/22/2019   Procedure: ESOPHAGOGASTRODUODENOSCOPY (EGD) WITH POSSIBLE BANDING;  Surgeon: Lavena Bullion, DO;  Location: [facility];  Service: Gastroenterology;   Laterality: Left;   ESOPHAGOGASTRODUODENOSCOPY (EGD) WITH PROPOFOL N/A 10/07/2018   Procedure: ESOPHAGOGASTRODUODENOSCOPY (EGD) WITH PROPOFOL;  Surgeon: Lavena Bullion, DO;  Location: [facility];  Service: Gastroenterology;  Laterality: N/A;   ESOPHAGOGASTRODUODENOSCOPY (EGD) WITH PROPOFOL N/A 01/03/2019   Procedure: ESOPHAGOGASTRODUODENOSCOPY (EGD) WITH PROPOFOL;  Surgeon: Lavena Bullion, DO;  Location: [facility];  Service: Gastroenterology;  Laterality: N/A;   ESOPHAGOGASTRODUODENOSCOPY (EGD) WITH PROPOFOL N/A 02/14/2019   Procedure: ESOPHAGOGASTRODUODENOSCOPY (EGD) WITH PROPOFOL;  Surgeon: Lavena Bullion, DO;  Location: [facility];  Service: Gastroenterology;  Laterality: N/A;   IR PARACENTESIS  10/05/2019   IR PARACENTESIS  10/24/2019   IR PARACENTESIS  11/16/2019   IR PARACENTESIS  12/25/2019   IR PARACENTESIS  01/05/2020   IR PARACENTESIS  01/23/2020   IR PARACENTESIS  02/08/2020   IR PARACENTESIS  02/16/2020   IR PARACENTESIS  02/28/2020   IR PARACENTESIS  03/12/2020   IR PARACENTESIS  04/04/2020   IR PARACENTESIS  04/11/2020   IR PARACENTESIS  04/24/2020   IR PARACENTESIS  05/03/2020   IR PARACENTESIS  05/14/2020   IR RADIOLOGIST EVAL & MGMT  12/19/2019   IR RADIOLOGIST EVAL & MGMT  02/27/2020   IR RADIOLOGIST EVAL & MGMT  04/09/2020   IR RADIOLOGIST EVAL & MGMT  05/09/2020   IR RADIOLOGIST EVAL & MGMT  01/30/2021   IR RADIOLOGIST EVAL & MGMT  08/06/2021   IR RADIOLOGIST EVAL & MGMT  07/28/2022   IR TIPS  03/12/2020   IR TIPS REVISION MOD SED  04/11/2020   RADIOLOGY WITH ANESTHESIA  N/A 03/12/2020   Procedure: TIPS;  Surgeon: Jacqulynn Cadet, MD;  Location: [facility];  Service: Radiology;  Laterality: N/A;    Prior to Admission medications   Medication Sig Start Date End Date Taking? Authorizing Provider  cholecalciferol (VITAMIN D) 1000 units tablet Take 1,000 Units by mouth daily.    Yes [provider]  dapagliflozin propanediol (FARXIGA) 10 MG TABS tablet Take 10 mg by  mouth at bedtime.   Yes [provider]  escitalopram (LEXAPRO) 10 MG tablet Take 10 mg by mouth daily.   Yes [provider]  furosemide (LASIX) 40 MG tablet Take 2 tablets (80 mg total) by mouth daily. 06/10/22  Yes Paije Goodhart V, DO  glipiZIDE (GLUCOTROL XL) 10 MG 24 hr tablet Take 10 mg by mouth 2 (two) times daily.   Yes [provider]  glipiZIDE (GLUCOTROL XL) 5 MG 24 hr tablet Take 10 mg by mouth 2 (two) times daily. 05/29/22  Yes [provider]  lactulose (CHRONULAC) 10 GM/15ML solution Take 30 mLs (20 g total) by mouth 3 (three) times daily. 03/13/20  Yes Ardis Rowan, PA-C  levOCARNitine (CARNITOR) 330 MG tablet Take 330 mg by mouth 3 (three) times daily.   Yes [provider]  metFORMIN (GLUCOPHAGE) 1000 MG tablet Take 1,000 mg by mouth 2 (two) times daily with a meal.   Yes [provider]  MOUNJARO 15 MG/0.5ML Pen SMARTSIG:1 Unspecified SUB-Q Once a Week 10/12/22  Yes [provider]  Multiple Vitamin (MULTIVITAMIN WITH MINERALS) TABS tablet Take 1 tablet by mouth at bedtime.   Yes [provider]  Multiple Vitamins-Minerals (ZINC PO) Take 1 tablet by mouth daily.   Yes [provider]  potassium chloride SA (KLOR-CON M) 20 MEQ tablet Take 1 tablet (20 mEq total) by mouth daily. 06/10/22  Yes Ethan Clayburn V, DO  propranolol (INDERAL) 40 MG tablet Take 0.5 tablets (20 mg total) by mouth 2 (two) times daily. Patient taking differently: Take 40 mg by mouth 2 (two) times daily. 01/27/20  Yes Hosie Poisson, MD  spironolactone (ALDACTONE) 25 MG tablet TAKE 1 TABLET BY MOUTH DAILY 09/22/21  Yes Gerald Honea V, DO  XIFAXAN 550 MG TABS tablet TAKE 1 TABLET BY MOUTH TWICE DAILY 04/23/22  Yes Kalyan Barabas V, DO  feeding supplement, ENSURE ENLIVE, (ENSURE ENLIVE) LIQD Take 237 mLs by mouth 2 (two) times daily between meals. 01/28/20   Hosie Poisson, MD  ferrous sulfate (KP FERROUS SULFATE) 325 (65  FE) MG tablet Take 1 tablet (325 mg total) by mouth daily with breakfast. Patient taking differently: Take 325 mg by mouth daily. 05/30/19   Meshach Perry V, DO  MOUNJARO 12.5 MG/0.5ML Pen Inject into the skin. 09/30/22   [provider]    Current Outpatient Medications  Medication Sig Dispense Refill   cholecalciferol (VITAMIN D) 1000 units tablet Take 1,000 Units by mouth daily.      dapagliflozin propanediol (FARXIGA) 10 MG TABS tablet Take 10 mg by mouth at bedtime.     escitalopram (LEXAPRO) 10 MG tablet Take 10 mg by mouth daily.     furosemide (LASIX) 40 MG tablet Take 2 tablets (80 mg total) by mouth daily. 180 tablet 1   glipiZIDE (GLUCOTROL XL) 10 MG 24 hr tablet Take 10 mg by mouth 2 (two) times daily.     glipiZIDE (GLUCOTROL XL) 5 MG 24 hr tablet Take 10 mg by mouth 2 (two) times daily.     lactulose (CHRONULAC) 10  GM/15ML solution Take 30 mLs (20 g total) by mouth 3 (three) times daily. 236 mL 0   levOCARNitine (CARNITOR) 330 MG tablet Take 330 mg by mouth 3 (three) times daily.     metFORMIN (GLUCOPHAGE) 1000 MG tablet Take 1,000 mg by mouth 2 (two) times daily with a meal.     MOUNJARO 15 MG/0.5ML Pen SMARTSIG:1 Unspecified SUB-Q Once a Week     Multiple Vitamin (MULTIVITAMIN WITH MINERALS) TABS tablet Take 1 tablet by mouth at bedtime.     Multiple Vitamins-Minerals (ZINC PO) Take 1 tablet by mouth daily.     potassium chloride SA (KLOR-CON M) 20 MEQ tablet Take 1 tablet (20 mEq total) by mouth daily. 90 tablet 1   propranolol (INDERAL) 40 MG tablet Take 0.5 tablets (20 mg total) by mouth 2 (two) times daily. (Patient taking differently: Take 40 mg by mouth 2 (two) times daily.) 60 tablet 0   spironolactone (ALDACTONE) 25 MG tablet TAKE 1 TABLET BY MOUTH DAILY 90 tablet 5   XIFAXAN 550 MG TABS tablet TAKE 1 TABLET BY MOUTH TWICE DAILY 60 tablet 5   feeding supplement, ENSURE ENLIVE, (ENSURE ENLIVE) LIQD Take 237 mLs by mouth 2 (two) times daily between meals. 237  mL 12   ferrous sulfate (KP FERROUS SULFATE) 325 (65 FE) MG tablet Take 1 tablet (325 mg total) by mouth daily with breakfast. (Patient taking differently: Take 325 mg by mouth daily.) 30 tablet 3   MOUNJARO 12.5 MG/0.5ML Pen Inject into the skin.     Current Facility-Administered Medications  Medication Dose Route Frequency Provider Last Rate Last Admin   0.9 %  sodium chloride infusion  500 mL Intravenous Once Tyliah Schlereth V, DO        Allergies as of 11/09/2022 - Review Complete 11/09/2022  Allergen Reaction Noted   Nadolol Itching and Nausea Only 10/06/2018    Family History  Problem Relation Age of Onset   Diabetes Other    Esophageal cancer Father    Colon cancer Neg Hx    Rectal cancer Neg Hx    Stomach cancer Neg Hx     Social History   Socioeconomic History   Marital status: Married    Spouse name: Not on file   Number of children: 2   Years of education: Not on file   Highest education level: Not on file  Occupational History   Not on file  Tobacco Use   Smoking status: Never   Smokeless tobacco: Never  Vaping Use   Vaping Use: Never used  Substance and Sexual Activity   Alcohol use: Not Currently   Drug use: Never   Sexual activity: Not on file  Other Topics Concern   Not on file  Social History Narrative   Not on file   Social Determinants of Health   Financial Resource Strain: Not on file  Food Insecurity: Not on file  Transportation Needs: Not on file  Physical Activity: Not on file  Stress: Not on file  Social Connections: Not on file  Intimate Partner Violence: Not on file    Physical Exam: Vital signs in last 24 hours: @BP  (!) 110/51   Pulse 75   Temp 97.8 F (36.6 C)   Ht 6' (1.829 m)   Wt 212 lb (96.2 kg)   SpO2 98%   BMI 28.75 kg/m  GEN: NAD EYE: Sclerae anicteric ENT: MMM CV: Non-tachycardic Pulm: CTA b/l GI: Soft, NT/ND NEURO:  Alert & Oriented x 3  Gerrit Heck, DO [facility]   11/09/2022  9:03 AM

## 2022-11-09 NOTE — Progress Notes (Signed)
Pt's states no medical or surgical changes since previsit or office visit. 

## 2022-11-09 NOTE — Op Note (Signed)
[place] Patient Name: Martin Strong Procedure Date: 11/09/2022 8:21 AM MRN: [id number] Endoscopist: Gerrit Heck , MD, [id number] Age: 56 Referring MD:  Date of Birth: 1966-07-01 Gender: Male Account #: [id number] Procedure:                Upper GI endoscopy Indications:              Epigastric abdominal pain, Abdominal pain in the                            left upper quadrant, Dysphagia, Nausea Medicines:                Monitored Anesthesia Care Procedure:                Pre-Anesthesia Assessment:                           - Prior to the procedure, a History and Physical                            was performed, and patient medications and                            allergies were reviewed. The patient's tolerance of                            previous anesthesia was also reviewed. The risks                            and benefits of the procedure and the sedation                            options and risks were discussed with the patient.                            All questions were answered, and informed consent                            was obtained. Prior Anticoagulants: The patient has                            taken no anticoagulant or antiplatelet agents. ASA                            Grade Assessment: III - A patient with severe                            systemic disease. After reviewing the risks and                            benefits, the patient was deemed in satisfactory                            condition to undergo the procedure.  After obtaining informed consent, the endoscope was                            passed under direct vision. Throughout the                            procedure, the patient's blood pressure, pulse, and                            oxygen saturations were monitored continuously. The                            Endoscope was introduced through the mouth, and                            advanced to  the second part of duodenum. The upper                            GI endoscopy was accomplished without difficulty.                            The patient tolerated the procedure well. Scope In: Scope Out: Findings:                 A few small, well-healed post variceal banding                            scars were found in the lower third of the                            esophagus. The scar tissue was healthy in                            appearance. Made several passes through the                            esophagus, and there was no stricture or luminal                            narrowing. No residual esophageal varices noted.                           The Z-line was regular and was found 47 cm from the                            incisors.                           Mild inflammation characterized by congestion                            (edema) and erythema was found in the gastric  fundus, in the gastric body and in the gastric                            antrum, with moderate inflammation in the gastric                            cardia. Biopsies were taken with a cold forceps for                            histology. Estimated blood loss was minimal.                           The examined duodenum was normal. Complications:            No immediate complications. Estimated Blood Loss:     Estimated blood loss was minimal. Impression:               - Scar in the lower third of the esophagus.                           - Z-line regular, 47 cm from the incisors.                           - Gastritis. Biopsied.                           - Normal examined duodenum. Recommendation:           - Patient has a contact number available for                            emergencies. The signs and symptoms of potential                            delayed complications were discussed with the                            patient. Return to normal activities tomorrow.                             Written discharge instructions were provided to the                            patient.                           - Resume previous diet.                           - Continue present medications.                           - Await pathology results.                           - Proceed with MBS as previosuly ordered.                           -  If MBS unremarkable, and if dysphagia continues,                            will plan for esophagram.                           - Start Pepcid 20 mg daily x4 weeks. Gerrit Heck, MD 11/09/2022 9:29:33 AM

## 2022-11-09 NOTE — Progress Notes (Signed)
Report given to PACU, vss 

## 2022-11-09 NOTE — Progress Notes (Signed)
Called to room to assist during endoscopic procedure.  Patient ID and intended procedure confirmed with present staff. Received instructions for my participation in the procedure from the performing physician.  

## 2022-11-10 ENCOUNTER — Telehealth: Payer: Self-pay | Admitting: *Deleted
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
## 2022-11-10 NOTE — Telephone Encounter (Signed)
Patient called back and stated he was doing well.

## 2022-11-12 ENCOUNTER — Encounter: Payer: Self-pay | Admitting: Gastroenterology

## 2022-11-13 ENCOUNTER — Telehealth (HOSPITAL_COMMUNITY): Payer: Self-pay

## 2022-11-13 NOTE — Telephone Encounter (Signed)
Called and followed up with patient to reschedule Modified Barium Swallow - patient stated he went to doctor and no longer needs test. Cancelled orders.

## 2022-12-08 ENCOUNTER — Other Ambulatory Visit: Payer: Self-pay

## 2022-12-08 ENCOUNTER — Telehealth: Payer: Self-pay | Admitting: Pharmacy Technician

## 2022-12-08 ENCOUNTER — Other Ambulatory Visit (HOSPITAL_COMMUNITY): Payer: Self-pay

## 2022-12-08 MED ORDER — POTASSIUM CHLORIDE CRYS ER 20 MEQ PO TBCR: 20.0000 meq | EXTENDED_RELEASE_TABLET | Freq: Every day | ORAL | 1 refills | Status: DC

## 2022-12-08 NOTE — Telephone Encounter (Signed)
Patient Advocate Encounter  Prior Authorization for Azzie Roup has been approved.    PA# [id number] Effective dates: 1.1.23 through 12.31.24  Received notification from [facility] that prior authorization for [facility] is required.   PA submitted on 12.26.23 Key [id number] Status is pending

## 2022-12-15 NOTE — Telephone Encounter (Signed)
Patient called to follow up on medication status.

## 2022-12-16 NOTE — Telephone Encounter (Signed)
Spoke with pt and pt stated xifaxan was supposed to be sent to a different pharmacy but he did not know the name of the pharmacy. Pt stated he was on his way home but once he returns he would look up the pharmacy he wanted medication sent to and call us back.

## 2022-12-16 NOTE — Telephone Encounter (Signed)
PT called back. Says that the medication should be sent to [facility] help patient assistant [phone].

## 2022-12-17 ENCOUNTER — Other Ambulatory Visit (HOSPITAL_COMMUNITY): Payer: Self-pay

## 2022-12-17 NOTE — Telephone Encounter (Signed)
Hey Monchell, do you know how much it would cost for pt to pick up xifaxan?

## 2022-12-17 NOTE — Telephone Encounter (Signed)
Test billing result with [facility] returns a copay of $1013.60. I was not aware that patient receive patient assistance with [employer]. I will contact patient if information is needed and submit application when all parts have been received. Telephone encounter will be created to follow.

## 2022-12-22 ENCOUNTER — Other Ambulatory Visit (HOSPITAL_COMMUNITY): Payer: Self-pay

## 2022-12-23 ENCOUNTER — Telehealth: Payer: Self-pay | Admitting: Gastroenterology

## 2022-12-23 NOTE — Telephone Encounter (Signed)
Patient called and is requesting refill on his spironolactone. Please advise.

## 2022-12-23 NOTE — Telephone Encounter (Signed)
[employer] patient assistance application faxed to [phone].

## 2022-12-24 MED ORDER — SPIRONOLACTONE 25 MG PO TABS: 25.0000 mg | ORAL_TABLET | Freq: Every day | ORAL | 5 refills | Status: DC

## 2022-12-24 NOTE — Telephone Encounter (Signed)
Spironolactone 25 mg sent to patient's pharmacy. Patient made aware.

## 2022-12-28 ENCOUNTER — Other Ambulatory Visit (HOSPITAL_COMMUNITY): Payer: Self-pay

## 2023-01-15 ENCOUNTER — Other Ambulatory Visit: Payer: Self-pay | Admitting: Nurse Practitioner

## 2023-01-15 DIAGNOSIS — Z95828 Presence of other vascular implants and grafts: Secondary | ICD-10-CM

## 2023-01-15 DIAGNOSIS — K7460 Unspecified cirrhosis of liver: Secondary | ICD-10-CM

## 2023-01-15 DIAGNOSIS — K7682 Hepatic encephalopathy: Secondary | ICD-10-CM

## 2023-02-08 ENCOUNTER — Ambulatory Visit
Admission: RE | Admit: 2023-02-08 | Discharge: 2023-02-08 | Disposition: A | Discharge status: Home / Self Care | Payer: Medicare PPO | Source: Ambulatory Visit | Attending: Nurse Practitioner | Admitting: Nurse Practitioner

## 2023-02-08 DIAGNOSIS — K7460 Unspecified cirrhosis of liver: Secondary | ICD-10-CM

## 2023-02-08 DIAGNOSIS — Z95828 Presence of other vascular implants and grafts: Secondary | ICD-10-CM

## 2023-02-08 DIAGNOSIS — K7682 Hepatic encephalopathy: Secondary | ICD-10-CM

## 2023-03-11 ENCOUNTER — Other Ambulatory Visit (HOSPITAL_COMMUNITY): Payer: Self-pay

## 2023-03-12 ENCOUNTER — Other Ambulatory Visit (HOSPITAL_COMMUNITY): Payer: Self-pay

## 2023-03-12 MED ORDER — MOUNJARO 10 MG/0.5ML ~~LOC~~ SOAJ
10.0000 mg | SUBCUTANEOUS | 0 refills | Status: DC
  Filled 2023-03-12: qty 2, 28d supply, fill #0

## 2023-03-26 ENCOUNTER — Telehealth: Payer: Self-pay | Admitting: Gastroenterology

## 2023-03-26 NOTE — Telephone Encounter (Signed)
Called [facility] at [phone] to follow up the status of patient assistance application. Customer service rep Violeta Gelinas informed me patient is denied with a reason of Low Income [employer]. I was informed he needs to apply extra help program from social security administration first, and if it is denied, he can appeal with submitting a denial letter  to [facility] again.

## 2023-03-26 NOTE — Telephone Encounter (Signed)
Called patient back. LVM stating that  we do not have Xifaxin samples. Advised patient to call back with an update of Patient Assistance program through [facility]. Not sure if patient is getting Xifaxin from [facility] or not.

## 2023-03-26 NOTE — Telephone Encounter (Signed)
PT is calling to see if we provided  Xifaxin samples. Please advise.

## 2023-04-07 ENCOUNTER — Other Ambulatory Visit (HOSPITAL_COMMUNITY): Payer: Self-pay

## 2023-04-07 MED ORDER — MOUNJARO 10 MG/0.5ML ~~LOC~~ SOAJ
10.0000 mg | SUBCUTANEOUS | 0 refills | Status: DC
  Filled 2023-04-07: qty 2, 28d supply, fill #0

## 2023-04-25 ENCOUNTER — Other Ambulatory Visit: Payer: Self-pay

## 2023-04-25 ENCOUNTER — Emergency Department (HOSPITAL_COMMUNITY)
Admission: EM | Admit: 2023-04-25 | Discharge: 2023-04-26 | Disposition: A | Discharge status: Home / Self Care | Payer: Worker's Compensation | Attending: Emergency Medicine | Admitting: Emergency Medicine

## 2023-04-25 DIAGNOSIS — S22080A Wedge compression fracture of T11-T12 vertebra, initial encounter for closed fracture: Secondary | ICD-10-CM | POA: Insufficient documentation

## 2023-04-25 DIAGNOSIS — Y92410 Unspecified street and highway as the place of occurrence of the external cause: Secondary | ICD-10-CM | POA: Diagnosis not present

## 2023-04-25 DIAGNOSIS — S3992XA Unspecified injury of lower back, initial encounter: Secondary | ICD-10-CM | POA: Diagnosis present

## 2023-04-25 DIAGNOSIS — S32010A Wedge compression fracture of first lumbar vertebra, initial encounter for closed fracture: Secondary | ICD-10-CM | POA: Insufficient documentation
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
## 2023-04-26 ENCOUNTER — Emergency Department (HOSPITAL_COMMUNITY): Payer: Worker's Compensation

## 2023-04-26 MED ORDER — LIDOCAINE 5 % EX PTCH: 1.0000 | MEDICATED_PATCH | CUTANEOUS | 0 refills | Status: DC

## 2023-04-26 MED ORDER — METHOCARBAMOL 500 MG PO TABS
500.0000 mg | ORAL_TABLET | Freq: Once | ORAL | Status: AC
  Administered 2023-04-26: 500 mg via ORAL
  Filled 2023-04-26: qty 1

## 2023-04-26 MED ORDER — OXYCODONE-ACETAMINOPHEN 5-325 MG PO TABS
1.0000 | ORAL_TABLET | Freq: Once | ORAL | Status: AC
  Administered 2023-04-26: 1 via ORAL
  Filled 2023-04-26: qty 1

## 2023-04-26 MED ORDER — METHOCARBAMOL 500 MG PO TABS: 500.0000 mg | ORAL_TABLET | Freq: Two times a day (BID) | ORAL | 0 refills | Status: AC

## 2023-04-26 MED ORDER — LIDOCAINE 5 % EX PTCH
1.0000 | MEDICATED_PATCH | CUTANEOUS | Status: DC
  Administered 2023-04-26: 1 via TRANSDERMAL
  Filled 2023-04-26: qty 1

## 2023-04-26 NOTE — Discharge Instructions (Signed)
You have compression fractures of your L1 vertebra and T12 vertebra--L stands for lumbar and T stands for thoracic--as we discussed, this is treated conservatively with Tylenol ibuprofen and muscle relaxers warm compresses lidocaine patches.  Follow-up with your primary care doctor.  As requested I have given you the information for a neurosurgeon, as we discussed there is generally not an indication for surgery/kyphoplasty unless you have a much more significant compression.  There is a 10%
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
## 2023-06-18 ENCOUNTER — Other Ambulatory Visit: Payer: Self-pay | Admitting: Nurse Practitioner

## 2023-06-18 DIAGNOSIS — K7460 Unspecified cirrhosis of liver: Secondary | ICD-10-CM

## 2023-07-05 ENCOUNTER — Ambulatory Visit
Admission: RE | Admit: 2023-07-05 | Discharge: 2023-07-05 | Disposition: A | Discharge status: Home / Self Care | Payer: Medicare HMO | Source: Ambulatory Visit | Attending: Nurse Practitioner | Admitting: Nurse Practitioner

## 2023-07-05 DIAGNOSIS — K7460 Unspecified cirrhosis of liver: Secondary | ICD-10-CM

## 2023-07-09 ENCOUNTER — Telehealth: Payer: Self-pay

## 2023-07-09 NOTE — Telephone Encounter (Signed)
Received mail from [facility] regarding patient assistance application for [employer]. Form is completed and sent to mail back to them. LVM to the patient to make him aware that provider section of the form is completed and mailed.

## 2023-07-16 ENCOUNTER — Ambulatory Visit
Admission: RE | Admit: 2023-07-16 | Discharge: 2023-07-16 | Disposition: A | Discharge status: Home / Self Care | Payer: Medicare HMO | Source: Ambulatory Visit | Attending: Nurse Practitioner | Admitting: Nurse Practitioner

## 2023-09-28 ENCOUNTER — Other Ambulatory Visit: Payer: Self-pay | Admitting: Orthopedic Surgery

## 2023-09-28 DIAGNOSIS — M546 Pain in thoracic spine: Secondary | ICD-10-CM

## 2023-10-02 ENCOUNTER — Ambulatory Visit
Admission: RE | Admit: 2023-10-02 | Discharge: 2023-10-02 | Disposition: A | Discharge status: Home / Self Care | Payer: Worker's Compensation | Source: Ambulatory Visit | Attending: Orthopedic Surgery | Admitting: Orthopedic Surgery

## 2023-10-02 DIAGNOSIS — M546 Pain in thoracic spine: Secondary | ICD-10-CM

## 2023-10-05 ENCOUNTER — Other Ambulatory Visit: Payer: Self-pay | Admitting: Interventional Radiology

## 2023-10-05 DIAGNOSIS — Z95828 Presence of other vascular implants and grafts: Secondary | ICD-10-CM

## 2023-10-05 DIAGNOSIS — K7581 Nonalcoholic steatohepatitis (NASH): Secondary | ICD-10-CM

## 2023-10-08 ENCOUNTER — Other Ambulatory Visit (HOSPITAL_COMMUNITY): Payer: Self-pay | Admitting: Interventional Radiology

## 2023-10-11 LAB — COMPLETE METABOLIC PANEL WITH GFR
AG Ratio: 1.2 (calc) (ref 1.0–2.5)
ALT: 22 U/L (ref 9–46)
AST: 27 U/L (ref 10–35)
Albumin: 3.9 g/dL (ref 3.6–5.1)
Alkaline phosphatase (APISO): 78 U/L (ref 35–144)
BUN/Creatinine Ratio: 18 (calc) (ref 6–22)
BUN: 12 mg/dL (ref 7–25)
CO2: 29 mmol/L (ref 20–32)
Calcium: 9.2 mg/dL (ref 8.6–10.3)
Chloride: 106 mmol/L (ref 98–110)
Creat: 0.66 mg/dL — ABNORMAL LOW (ref 0.70–1.30)
Globulin: 3.2 g/dL (ref 1.9–3.7)
Glucose, Bld: 157 mg/dL — ABNORMAL HIGH (ref 65–99)
Potassium: 4.1 mmol/L (ref 3.5–5.3)
Sodium: 143 mmol/L (ref 135–146)
Total Bilirubin: 1.4 mg/dL — ABNORMAL HIGH (ref 0.2–1.2)
Total Protein: 7.1 g/dL (ref 6.1–8.1)
eGFR: 111 mL/min/{1.73_m2} (ref >=60)

## 2023-10-11 LAB — CBC
HCT: 38.3 % — ABNORMAL LOW (ref 38.5–50.0)
Hemoglobin: 13.6 g/dL (ref 13.2–17.1)
MCH: 33.6 pg — ABNORMAL HIGH (ref 27.0–33.0)
MCHC: 35.5 g/dL (ref 32.0–36.0)
MCV: 94.6 fL (ref 80.0–100.0)
MPV: 10.6 fL (ref 7.5–12.5)
Platelets: 112 10*3/uL — ABNORMAL LOW (ref 140–400)
RBC: 4.05 10*6/uL — ABNORMAL LOW (ref 4.20–5.80)
RDW: 14.5 % (ref 11.0–15.0)
WBC: 6.2 10*3/uL (ref 3.8–10.8)

## 2023-10-11 LAB — AFP TUMOR MARKER: AFP-Tumor Marker: 1.2 ng/mL (ref <6.1)

## 2023-10-11 LAB — PROTIME-INR
INR: 1.1
Prothrombin Time: 11.7 s — ABNORMAL HIGH (ref 9.0–11.5)

## 2023-10-19 ENCOUNTER — Ambulatory Visit
Admission: RE | Admit: 2023-10-19 | Discharge: 2023-10-19 | Disposition: A | Discharge status: Home / Self Care | Payer: Medicare HMO | Source: Ambulatory Visit | Attending: Interventional Radiology | Admitting: Interventional Radiology

## 2023-10-19 DIAGNOSIS — Z95828 Presence of other vascular implants and grafts: Secondary | ICD-10-CM

## 2023-10-19 HISTORY — PX: IR RADIOLOGIST EVAL & MGMT: IMG5224
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
## 2023-11-17 ENCOUNTER — Other Ambulatory Visit (HOSPITAL_COMMUNITY): Payer: Self-pay

## 2023-12-16 ENCOUNTER — Other Ambulatory Visit (HOSPITAL_COMMUNITY): Payer: Self-pay

## 2024-01-04 ENCOUNTER — Telehealth: Payer: Self-pay | Admitting: Gastroenterology

## 2024-01-04 NOTE — Telephone Encounter (Signed)
Inbound call from patient, would like xifaxin prescription sent through [facility], he scheduled an appointment for 2/28 with Dr. Barron Alvine, but would like medication filled until then.

## 2024-01-05 MED ORDER — RIFAXIMIN 550 MG PO TABS: 550.0000 mg | ORAL_TABLET | Freq: Two times a day (BID) | ORAL | 1 refills | Status: DC

## 2024-01-05 NOTE — Telephone Encounter (Signed)
Patient advised that Rx for Xifaxan did not go through.  Patient stated he will look into this and see if she can get a fax number that is working and he will call me back.

## 2024-01-05 NOTE — Telephone Encounter (Signed)
Rx for Xifaxan 550mg  one tablet twice daily sent to [facility] as requested.

## 2024-01-05 NOTE — Telephone Encounter (Signed)
Left message for patient to return call as the Rx was not delivered to [facility].  Will continue efforts.

## 2024-01-05 NOTE — Addendum Note (Signed)
Addended by: Lamona Curl on: 01/05/2024 02:20 PM   Modules accepted: Orders

## 2024-01-05 NOTE — Telephone Encounter (Signed)
Inbound call from patient returning phone call. Requesting a call back. Please advise, thank you.  

## 2024-01-06 ENCOUNTER — Other Ambulatory Visit: Payer: Self-pay

## 2024-01-06 MED ORDER — RIFAXIMIN 550 MG PO TABS: 550.0000 mg | ORAL_TABLET | Freq: Two times a day (BID) | ORAL | 0 refills | Status: AC

## 2024-01-18 NOTE — Telephone Encounter (Signed)
Patient returned call and said he had the [facility] pharmacy fax over a cover sheet with the correct information on it to fax paperwork. I will forward this message to Joni Reining so she is aware. And check for the fax

## 2024-01-18 NOTE — Telephone Encounter (Signed)
Inbound call from patient, following up on information below with [facility].

## 2024-01-19 NOTE — Telephone Encounter (Signed)
 Attempted to reach patient by phone to discuss [facility] information, but there was no answer and no voicemail set up.  No paper has been received from this pharmacy at this time.  Will continue efforts.

## 2024-01-24 NOTE — Telephone Encounter (Signed)
 Phone call to the patient to further discuss Xifaxan  being sent to [facility].  Patient stated that the pharmacy has faxed information to our office, but we have not received it.  Patient provided me with phone number of [phone].  Phone call to [facility] and I spoke with [facility] who stated this is a patient assistance pharmacy and that they have been trying to fax paper work to our office.  The fax number they were using is incorrect.  I provided Charlene with the correct fax number and she stated she would send the information we need.  Will wait for the fax.

## 2024-01-25 NOTE — Telephone Encounter (Signed)
Call the number provided by the patient and asked that paper work be refaxed.  Paperwork was received and completed and faxed to [facility].  Patient aware that patient assistance paperwork was faxed and he should follow up with [facility] to see if Xifaxan was approved.  Patient agreed to plan and verbalized understanding.  No further questions.

## 2024-02-11 ENCOUNTER — Encounter: Payer: Self-pay | Admitting: Gastroenterology

## 2024-02-11 ENCOUNTER — Ambulatory Visit: Payer: Medicare HMO | Admitting: Gastroenterology

## 2024-02-11 VITALS — BP 138/72 | HR 81 | Ht 70.0 in | Wt 221.0 lb

## 2024-02-11 DIAGNOSIS — K7682 Hepatic encephalopathy: Secondary | ICD-10-CM

## 2024-02-11 DIAGNOSIS — K7460 Unspecified cirrhosis of liver: Secondary | ICD-10-CM | POA: Diagnosis not present

## 2024-02-11 DIAGNOSIS — K7581 Nonalcoholic steatohepatitis (NASH): Secondary | ICD-10-CM | POA: Diagnosis not present

## 2024-02-11 DIAGNOSIS — Z860100 Personal history of colon polyps, unspecified: Secondary | ICD-10-CM

## 2024-02-11 DIAGNOSIS — K3189 Other diseases of stomach and duodenum: Secondary | ICD-10-CM | POA: Diagnosis not present

## 2024-02-11 MED ORDER — SUFLAVE 178.7 G PO SOLR: 1.0000 | Freq: Once | ORAL | 0 refills | Status: DC

## 2024-02-11 MED ORDER — SUFLAVE 178.7 G PO SOLR: 1.0000 | Freq: Once | ORAL | 0 refills | Status: AC

## 2024-02-11 NOTE — Patient Instructions (Addendum)
 _______________________________________________________  If your blood pressure at your visit was 140/90 or greater, please contact your primary care physician to follow up on this.  If you are age 57 or younger, your body mass index should be between 19-25. Your Body mass index is 31.71 kg/m. If this is out of the aformentioned range listed, please consider follow up with your Primary Care Provider.  ________________________________________________________  The [facility] providers would like to encourage you to use [facility] to communicate with providers for non-urgent requests or questions.  Due to long hold times on the telephone, sending your provider a message by [facility] may be a faster and more efficient way to get a response.  Please allow 48 business hours for a response.  Please remember that this is for non-urgent requests.  _______________________________________________________  Martin Strong have been scheduled for a colonoscopy. Please follow written instructions given to you at your visit today.   If you use inhalers (even only as needed), please bring them with you on the day of your procedure.  DO NOT TAKE 7 DAYS PRIOR TO TEST- Trulicity (dulaglutide) Ozempic, Wegovy (semaglutide) Mounjaro (tirzepatide) Bydureon Bcise (exanatide extended release)  DO NOT TAKE 1 DAY PRIOR TO YOUR TEST Rybelsus (semaglutide) Adlyxin (lixisenatide) Victoza (liraglutide) Byetta (exanatide) ___________________________________________________________________________  Martin Strong will receive your bowel preparation through [facility], which ensures the lowest copay and home delivery, with outreach via text or call from an [phone] number. Please respond promptly to avoid rescheduling of your procedure. If you are interested in alternative options or have any questions regarding your prep, please contact them at [phone] ____________________________________________________________________________  Your Provider  Has Sent Your Bowel Prep Regimen To [facility]   [facility] will contact you to verify your information and collect your copay, if applicable. Enjoy the comfort of your home while your prescription is mailed to you, FREE of any shipping charges.   [facility] accepts all major insurance benefits and applies discounts & coupons.  Have additional questions?   Chat: [email or web address] Call: [phone] Email: [email or web address] NCPDP: [id number]  How will [facility] contact you?  With a Welcome phone call,  a Welcome text and a checkout link in text form.  Texts you receive from [phone] Are NOT Spam.  *To set up delivery, you must complete the checkout process via link or speak to one of the patient care representatives. If [facility] is unable to reach you, your prescription may be delayed.  To avoid long hold times on the phone, you may also utilize the secure chat feature on the [facility] website to request that they call you back for transaction completion or to expedite your concerns.  Due to recent changes in healthcare laws, you may see the results of your imaging and laboratory studies on MyChart before your provider has had a chance to review them.  We understand that in some cases there may be results that are confusing or concerning to you. Not all laboratory results come back in the same time frame and the provider may be waiting for multiple results in order to interpret others.  Please give [place] 48 hours in order for your provider to thoroughly review all the results before contacting the office for clarification of your results.   It was a pleasure to see you today!  Vito Cirigliano, D.O.

## 2024-02-11 NOTE — Progress Notes (Signed)
 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  Was previously followed by GI in [place], [place].  Cirrhosis complicated by esophageal varices with bleeding requiring multiple EGDs with banding in the past along with propranolol for dual prophylaxis.  Developed large volume ascites in 09/2019.  Previously diuretic intolerant, required serial large-volume paracentesis, TIPS placement with subsequent revision, and low-sodium diet, now back on diuretics. Admitted with SBP and AKI in 01/2020.  Admitted 04/2020 with hepatic encephalopathy-started lactulose/rifaximin.   Follows with Annamarie Major at [facility].  MELD currently too low for liver transplant, and does not currently have candidate for living donor transplant.   Cirrhosis Hx: - Etiology: NASH - Diagnosed 2017 - Liver bx: None - Complications: EV with admission for bleeding 09/2018 treated with EGD with EVL, large volume ascites serial LVP starting 09/2019 TIPS 02/2020 (revision 03/2020), SBP 01/2020, hepatic encephalopathy 04/2020, sarcopenia - Hepatic Rx: Lactulose, rifaximin, Lasix 20 mg/day, Aldactone 25 mg/day, Cipro 750 mg/week, levocarnitine - Previous hepatic Rx: Propranolol (dual prophy) and PPI discontinued due to SBP.  History of diuretic intolerance and AKI.  Follows with Nephrology, managing Lasix/Aldactone - HCC Screening: UTD -03/13/2020: TIPS procedure -04/11/2020: TIPS revision -5/Martin/2021: Patent TIPS with high velocity flow throughout - Annual lab check: Completed 08/2021 (except INR was not checked) - HAV and HBV vaccine series completed - MELD: 9 (fluctuates 9-14) - Child Pugh: A   Endoscopic history: - EGD x3 in 2017 in [place], [place] for esophageal varices with  banding - EGD (09/2018, Dr. Barron Alvine): Grade 2 esophageal varices with stigmata of recent bleeding requiring banding x2 with complete eradication, portal hypertensive gastropathy without bleeding. - EGD (12/2018, Dr. Barron Alvine): Grade II esophageal varices, 6 bands placed with deflation. Normal Z line at 46 cm, severe portal HTN gastropathy with contact oozing, small GOV1, normal duodenum. H pylori serologies negative -EGD (02/2019, Dr. Barron Alvine): Grade 2 esophageal varices, 4 bands placed with complete eradication.  Portal hypertensive gastropathy/2 adenopathy -EGD (07/2019, Dr. Barron Alvine): Done for dysphagia. grade 1 esophageal varices, multiple post variceal banding scars in the lower third with 1 area of luminal deformity, likely consistent with prior deep banding site.  The lumen was mildly narrowed, but easily traversed and no additional endoscopic dilation performed.  Mild esophagitis at the GEJ, portal hypertensive gastropathy, peptic antritis/duodenitis.  Papilla prominent. - EGD (10/2022): Well-healed post variceal banding scars in the lower esophagus with no residual varices.  Mild gastritis, normal duodenum  HPI:     Patient is a 58 y.o. Strong presenting to the [facility]ology [facility] for follow-up.  Was last seen by me in the office on 10/02/2022.  Has been following with Annamarie Major at [facility], most recently seen on 06/16/2023.  HCC screening UTD.  Has follow-up scheduled on/3/25.  Today, he states he is feeling well.  No active issues or concerns.  Tolerating medications as prescribed.  Last colonoscopy was approx 2017 in [place], [place] and notable for polyps.   Review of systems:     No chest pain, no SOB, no fevers, no urinary sx   Past Medical History:  Diagnosis Date   Anemia    Arthritis    Cirrhosis (HCC)    Diabetes mellitus without complication (HCC)    Dyspnea  Fatty liver    GERD (gastroesophageal reflux disease)    GI bleed    Hyperlipidemia     Hypertension     Patient's surgical history, family medical history, social history, medications and allergies were all reviewed in Epic    Current Outpatient Medications  Medication Sig Dispense Refill   cholecalciferol (VITAMIN D) 1000 units tablet Take 1,000 Units by mouth daily.      dapagliflozin propanediol (FARXIGA) 10 MG TABS tablet Take 10 mg by mouth at bedtime.     escitalopram (LEXAPRO) 10 MG tablet Take 10 mg by mouth daily.     feeding supplement, ENSURE ENLIVE, (ENSURE ENLIVE) LIQD Take 237 mLs by mouth 2 (two) times daily between meals. 237 mL 12   ferrous sulfate (KP FERROUS SULFATE) 325 (65 FE) MG tablet Take 1 tablet (325 mg total) by mouth daily with breakfast. (Patient taking differently: Take 325 mg by mouth daily.) 30 tablet 3   glipiZIDE (GLUCOTROL XL) 10 MG 24 hr tablet Take 10 mg by mouth 2 (two) times daily.     glipiZIDE (GLUCOTROL XL) 5 MG 24 hr tablet Take 10 mg by mouth 2 (two) times daily.     lactulose (CHRONULAC) 10 GM/15ML solution Take 30 mLs (20 g total) by mouth 3 (three) times daily. 236 mL 0   levOCARNitine (CARNITOR) 330 MG tablet Take 330 mg by mouth 3 (three) times daily.     metFORMIN (GLUCOPHAGE) 1000 MG tablet Take 1,000 mg by mouth 2 (two) times daily with a meal.     methocarbamol (ROBAXIN) 500 MG tablet Take 1 tablet (500 mg total) by mouth 2 (two) times daily. 20 tablet 0   MOUNJARO 12.5 MG/0.5ML Pen Inject into the skin.     MOUNJARO 15 MG/0.5ML Pen SMARTSIG:1 Unspecified SUB-Q Once a Week     Multiple Vitamin (MULTIVITAMIN WITH MINERALS) TABS tablet Take 1 tablet by mouth at bedtime.     Multiple Vitamins-Minerals (ZINC PO) Take 1 tablet by mouth daily.     propranolol (INDERAL) 40 MG tablet Take 0.5 tablets (20 mg total) by mouth 2 (two) times daily. (Patient taking differently: Take 40 mg by mouth 2 (two) times daily.) 60 tablet 0   rifaximin (XIFAXAN) 550 MG TABS tablet Take 1 tablet (550 mg total) by mouth 2 (two) times daily. Keep  appointment for 02-11-24 for further refills. 180 tablet 0   No current facility-administered medications for this visit.    Physical Exam:     BP 138/72   Pulse 81   Ht 5\' 10"  (1.778 m)   Wt 221 lb (100.2 kg)   BMI 31.71 kg/m   GENERAL:  Pleasant Strong in NAD PSYCH: : Cooperative, normal affect CARDIAC:  RRR, no murmur heard, no peripheral edema PULM: Normal respiratory effort, lungs CTA bilaterally, no wheezing ABDOMEN:  Nondistended, soft, nontender. No obvious masses, no hepatomegaly,  normal bowel sounds NEURO: Alert and oriented x 3, no focal neurologic deficits   IMPRESSION and PLAN:    1) NASH Cirrhosis- MELD 9 2) Ascites-resolved with TIPS 3) SBP 01/2020 4) Esophageal varices-resolved with EVL and TIPS 5) Portal hypertensive gastropathy 6) Hepatic encephalopathy-controlled -Continue lactulose and rifaximin - Continue zinc and levocarnitine - No need for repeat EGD for EV surveillance given functioning TIPS and findings at time of last EGD - HCC screening UTD and being coordinated through [facility] - PPI previously discontinued due to SBP history - Continue low-sodium diet - Continue branched chain amino acids for sarcopenia  -  Continue regular follow-up in [facility] as scheduled  7) History of colon polyps - Due for repeat colonoscopy for ongoing polyp surveillance and CRC screening - Scheduling colonoscopy today - Hold Mounjaro 1 week prior  RTC in 1 year or sooner as needed  The indications, risks, and benefits of colonoscopy were explained to the patient in detail. Risks include but are not limited to bleeding, perforation, adverse reaction to medications, and cardiopulmonary compromise. Sequelae include but are not limited to the possibility of surgery, hospitalization, and mortality. The patient verbalized understanding and wished to proceed. All questions answered, referred to the scheduler and bowel prep ordered. Further  recommendations pending results of the exam.        Shellia Cleverly ,DO, FACG 02/11/2024, 8:39 AM

## 2024-03-09 ENCOUNTER — Encounter: Payer: Self-pay | Admitting: Gastroenterology

## 2024-03-16 ENCOUNTER — Other Ambulatory Visit: Payer: Self-pay | Admitting: Nurse Practitioner

## 2024-03-16 DIAGNOSIS — K766 Portal hypertension: Secondary | ICD-10-CM

## 2024-03-16 DIAGNOSIS — K7460 Unspecified cirrhosis of liver: Secondary | ICD-10-CM

## 2024-03-16 DIAGNOSIS — Z95828 Presence of other vascular implants and grafts: Secondary | ICD-10-CM

## 2024-03-16 DIAGNOSIS — K7682 Hepatic encephalopathy: Secondary | ICD-10-CM

## 2024-03-17 ENCOUNTER — Ambulatory Visit: Payer: Medicare HMO | Admitting: Gastroenterology

## 2024-03-17 ENCOUNTER — Encounter: Payer: Self-pay | Admitting: Gastroenterology

## 2024-03-17 VITALS — BP 136/72 | HR 73 | Temp 98.4°F | Resp 13 | Ht 70.0 in | Wt 221.0 lb

## 2024-03-17 DIAGNOSIS — Z860100 Personal history of colon polyps, unspecified: Secondary | ICD-10-CM

## 2024-03-17 DIAGNOSIS — K635 Polyp of colon: Secondary | ICD-10-CM

## 2024-03-17 DIAGNOSIS — Z1211 Encounter for screening for malignant neoplasm of colon: Secondary | ICD-10-CM

## 2024-03-17 DIAGNOSIS — D123 Benign neoplasm of transverse colon: Secondary | ICD-10-CM | POA: Diagnosis not present

## 2024-03-17 DIAGNOSIS — D125 Benign neoplasm of sigmoid colon: Secondary | ICD-10-CM

## 2024-03-17 DIAGNOSIS — K641 Second degree hemorrhoids: Secondary | ICD-10-CM

## 2024-03-17 MED ORDER — SODIUM CHLORIDE 0.9 % IV SOLN: 500.0000 mL | Freq: Once | INTRAVENOUS | Status: DC

## 2024-03-17 NOTE — Progress Notes (Signed)
 Called to room to assist during endoscopic procedure.  Patient ID and intended procedure confirmed with present staff. Received instructions for my participation in the procedure from the performing physician.

## 2024-03-17 NOTE — Progress Notes (Signed)
 Pt ate yesterday- peas, carrots, rice and potatoes.  MD made aware

## 2024-03-17 NOTE — Op Note (Signed)
 [facility] Patient Name: Martin Strong Procedure Date: 03/17/2024 2:22 PM MRN: [id number] Endoscopist: Doristine Locks , MD, [id number] Age: 58 Referring MD:  Date of Birth: 15-Jun-1966 Gender: Male Account #: [id number] Procedure:                Colonoscopy Indications:              High risk colon cancer surveillance: Personal                            history of colonic polyps Medicines:                Monitored Anesthesia Care Procedure:                Pre-Anesthesia Assessment:                           - Prior to the procedure, a History and Physical                            was performed, and patient medications and                            allergies were reviewed. The patient's tolerance of                            previous anesthesia was also reviewed. The risks                            and benefits of the procedure and the sedation                            options and risks were discussed with the patient.                            All questions were answered, and informed consent                            was obtained. Prior Anticoagulants: The patient has                            taken no anticoagulant or antiplatelet agents. ASA                            Grade Assessment: III - A patient with severe                            systemic disease. After reviewing the risks and                            benefits, the patient was deemed in satisfactory                            condition to undergo the procedure.  After obtaining informed consent, the colonoscope                            was passed under direct vision. Throughout the                            procedure, the patient's blood pressure, pulse, and                            oxygen saturations were monitored continuously. The                            CF [id number] #[id number] was introduced through the anus                            and advanced to the the cecum,  identified by                            appendiceal orifice and ileocecal valve. The                            colonoscopy was technically difficult and complex                            due to inadequate bowel prep. The patient tolerated                            the procedure well. The quality of the bowel                            preparation was inadequate. The ileocecal valve,                            appendiceal orifice, and rectum were photographed. Scope In: 2:37:33 PM Scope Out: 2:49:53 PM Scope Withdrawal Time: 0 hours 9 minutes 0 seconds  Total Procedure Duration: 0 hours 12 minutes 20 seconds  Findings:                 The perianal and digital rectal examinations were                            normal.                           Two sessile polyps were found in the sigmoid colon                            and transverse colon. The polyps were 3 to 4 mm in                            size. These polyps were removed with a cold snare.                            Resection and retrieval were  complete. Estimated                            blood loss was minimal.                           Non-bleeding internal hemorrhoids were found during                            retroflexion. The hemorrhoids were small and Grade                            II (internal hemorrhoids that prolapse but reduce                            spontaneously).                           A large amount of solid stool was found in the                            ascending colon and in the cecum, precluding                            visualization. Lavage of the area was performed                            using copious amounts of sterile water, resulting                            in incomplete clearance with fair visualization. Complications:            No immediate complications. Estimated Blood Loss:     Estimated blood loss was minimal. Impression:               - Preparation of the colon was  inadequate.                           - Two 3 to 4 mm polyps in the sigmoid colon and in                            the transverse colon, removed with a cold snare.                            Resected and retrieved.                           - Non-bleeding internal hemorrhoids.                           - Stool in the ascending colon and in the cecum. Recommendation:           - Patient has a contact number available for                            emergencies. The  signs and symptoms of potential                            delayed complications were discussed with the                            patient. Return to normal activities tomorrow.                            Written discharge instructions were provided to the                            patient.                           - Resume previous diet.                           - Continue present medications.                           - Await pathology results.                           - Repeat colonoscopy in 1-2 years because the bowel                            preparation was suboptimal and for surveillance                            based on pathology results.                           - Return to GI office PRN. Doristine Locks, MD 03/17/2024 2:54:20 PM

## 2024-03-17 NOTE — Patient Instructions (Signed)
 YOU HAD AN ENDOSCOPIC PROCEDURE TODAY AT THE [facility]:   Refer to the procedure report that was given to you for any specific questions about what was found during the examination.  If the procedure report does not answer your questions, please call your gastroenterologist to clarify.  If you requested that your care partner not be given the details of your procedure findings, then the procedure report has been included in a sealed envelope for you to review at your convenience later.  YOU SHOULD EXPECT: Some feelings of bloating in the abdomen. Passage of more gas than usual.  Walking can help get rid of the air that was put into your GI tract during the procedure and reduce the bloating. If you had a lower endoscopy (such as a colonoscopy or flexible sigmoidoscopy) you may notice spotting of blood in your stool or on the toilet paper. If you underwent a bowel prep for your procedure, you may not have a normal bowel movement for a few days.  Please Note:  You might notice some irritation and congestion in your nose or some drainage.  This is from the oxygen used during your procedure.  There is no need for concern and it should clear up in a day or so.  SYMPTOMS TO REPORT IMMEDIATELY:  Following lower endoscopy (colonoscopy or flexible sigmoidoscopy):  Excessive amounts of blood in the stool  Significant tenderness or worsening of abdominal pains  Swelling of the abdomen that is new, acute  Fever of 100F or higher   For urgent or emergent issues, a gastroenterologist can be reached at any hour by calling [phone]. Do not use MyChart messaging for urgent concerns.    DIET:  We do recommend a small meal at first, but then you may proceed to your regular diet.  Drink plenty of fluids but you should avoid alcoholic beverages for 24 hours.  MEDICATIONS: Continue present medications.  FOLLOW UP: Await pathology results. Repeat colonoscopy in 1-2 years because the bowel  preparation was suboptimal and for surveillance based on pathology results.  Return to GI office as needed.  Please see handouts given to you by your recovery nurse: Polyps, Hemorrhoids.  Thank you for allowing [place] to provide for your healthcare needs today.  ACTIVITY:  You should plan to take it easy for the rest of today and you should NOT DRIVE or use heavy machinery until tomorrow (because of the sedation medicines used during the test).    FOLLOW UP: Our staff will call the number listed on your records the next business day following your procedure.  We will call around 7:15- 8:00 am to check on you and address any questions or concerns that you may have regarding the information given to you following your procedure. If we do not reach you, we will leave a message.     If any biopsies were taken you will be contacted by phone or by letter within the next 1-3 weeks.  Please call us at [phone] if you have not heard about the biopsies in 3 weeks.    SIGNATURES/CONFIDENTIALITY: You and/or your care partner have signed paperwork which will be entered into your electronic medical record.  These signatures attest to the fact that that the information above on your After Visit Summary has been reviewed and is understood.  Full responsibility of the confidentiality of this discharge information lies with you and/or your care-partner.

## 2024-03-17 NOTE — Progress Notes (Signed)
 Sedate, gd SR, tolerated procedure well, VSS, report to RN

## 2024-03-17 NOTE — Progress Notes (Signed)
 GASTROENTEROLOGY PROCEDURE H&P NOTE   Primary Care Physician: Erskine Emery, NP    Reason for Procedure:  Colon polyp surveillance  Plan:    Colonoscopy  Patient is appropriate for endoscopic procedure(s) in the ambulatory ([facility]) setting.  The nature of the procedure, as well as the risks, benefits, and alternatives were carefully and thoroughly reviewed with the patient. Ample time for discussion and questions allowed. The patient understood, was satisfied, and agreed to proceed.     HPI: Martin Strong is a 58 y.o. male who presents for colonoscopy for ongoing colon polyp surveillance and colon cancer screening.  No active GI symptoms.  No known family history of colon cancer or related malignancy.  Patient is otherwise without complaints or active issues today.  Last colonoscopy was 2017 in [place], [place] and notable for polyps (unknonw size, histology, location).   Past Medical History:  Diagnosis Date   Anemia    Arthritis    Blood transfusion without reported diagnosis    Cirrhosis (HCC)    Diabetes mellitus without complication (HCC)    Dyspnea    Fatty liver    GERD (gastroesophageal reflux disease)    GI bleed    Hyperlipidemia    Hypertension     Past Surgical History:  Procedure Laterality Date   BIOPSY  02/14/2019   Procedure: BIOPSY;  Surgeon: Shellia Cleverly, DO;  Location: [facility];  Service: Gastroenterology;;   BIOPSY  07/22/2019   Procedure: BIOPSY;  Surgeon: Shellia Cleverly, DO;  Location: [facility];  Service: Gastroenterology;;   COLONOSCOPY     ESOPHAGEAL BANDING  10/07/2018   Procedure: ESOPHAGEAL BANDING;  Surgeon: Shellia Cleverly, DO;  Location: [facility];  Service: Gastroenterology;;   ESOPHAGEAL BANDING N/A 01/03/2019   Procedure: ESOPHAGEAL BANDING;  Surgeon: Shellia Cleverly, DO;  Location: [facility];  Service: Gastroenterology;  Laterality: N/A;   ESOPHAGEAL BANDING  02/14/2019   Procedure: ESOPHAGEAL  BANDING;  Surgeon: Shellia Cleverly, DO;  Location: [facility];  Service: Gastroenterology;;   esophageal bands     ESOPHAGOGASTRODUODENOSCOPY Left 07/22/2019   Procedure: ESOPHAGOGASTRODUODENOSCOPY (EGD) WITH POSSIBLE BANDING;  Surgeon: Shellia Cleverly, DO;  Location: [facility];  Service: Gastroenterology;  Laterality: Left;   ESOPHAGOGASTRODUODENOSCOPY (EGD) WITH PROPOFOL N/A 10/07/2018   Procedure: ESOPHAGOGASTRODUODENOSCOPY (EGD) WITH PROPOFOL;  Surgeon: Shellia Cleverly, DO;  Location: [facility];  Service: Gastroenterology;  Laterality: N/A;   ESOPHAGOGASTRODUODENOSCOPY (EGD) WITH PROPOFOL N/A 01/03/2019   Procedure: ESOPHAGOGASTRODUODENOSCOPY (EGD) WITH PROPOFOL;  Surgeon: Shellia Cleverly, DO;  Location: [facility];  Service: Gastroenterology;  Laterality: N/A;   ESOPHAGOGASTRODUODENOSCOPY (EGD) WITH PROPOFOL N/A 02/14/2019   Procedure: ESOPHAGOGASTRODUODENOSCOPY (EGD) WITH PROPOFOL;  Surgeon: Shellia Cleverly, DO;  Location: [facility];  Service: Gastroenterology;  Laterality: N/A;   IR PARACENTESIS  10/05/2019   IR PARACENTESIS  10/24/2019   IR PARACENTESIS  11/16/2019   IR PARACENTESIS  12/25/2019   IR PARACENTESIS  01/05/2020   IR PARACENTESIS  01/23/2020   IR PARACENTESIS  02/08/2020   IR PARACENTESIS  02/16/2020   IR PARACENTESIS  02/28/2020   IR PARACENTESIS  03/12/2020   IR PARACENTESIS  04/04/2020   IR PARACENTESIS  04/11/2020   IR PARACENTESIS  04/24/2020   IR PARACENTESIS  05/03/2020   IR PARACENTESIS  05/14/2020   IR RADIOLOGIST EVAL & MGMT  12/19/2019   IR RADIOLOGIST EVAL & MGMT  02/27/2020   IR RADIOLOGIST EVAL & MGMT  04/09/2020   IR RADIOLOGIST EVAL &  MGMT  05/09/2020   IR RADIOLOGIST EVAL & MGMT  01/30/2021   IR RADIOLOGIST EVAL & MGMT  08/06/2021   IR RADIOLOGIST EVAL & MGMT  07/28/2022   IR RADIOLOGIST EVAL & MGMT  10/19/2023   IR TIPS  03/12/2020   IR TIPS REVISION MOD SED  04/11/2020   RADIOLOGY WITH ANESTHESIA N/A 03/12/2020    Procedure: TIPS;  Surgeon: Malachy Moan, MD;  Location: [facility];  Service: Radiology;  Laterality: N/A;   UPPER GASTROINTESTINAL ENDOSCOPY      Prior to Admission medications   Medication Sig Start Date End Date Taking? Authorizing Provider  cholecalciferol (VITAMIN D) 1000 units tablet Take 1,000 Units by mouth daily.    Yes [provider]  dapagliflozin propanediol (FARXIGA) 10 MG TABS tablet Take 10 mg by mouth at bedtime.   Yes [provider]  escitalopram (LEXAPRO) 10 MG tablet Take 10 mg by mouth daily.   Yes [provider]  ferrous sulfate (KP FERROUS SULFATE) 325 (65 FE) MG tablet Take 1 tablet (325 mg total) by mouth daily with breakfast. Patient taking differently: Take 325 mg by mouth daily. 05/30/19  Yes Kaipo Ardis V, DO  glipiZIDE (GLUCOTROL XL) 10 MG 24 hr tablet Take 10 mg by mouth 2 (two) times daily.   Yes [provider]  lactulose (CHRONULAC) 10 GM/15ML solution Take 30 mLs (20 g total) by mouth 3 (three) times daily. 03/13/20  Yes Gershon Crane, PA-C  levOCARNitine (CARNITOR) 330 MG tablet Take 330 mg by mouth 3 (three) times daily.   Yes [provider]  metFORMIN (GLUCOPHAGE) 1000 MG tablet Take 1,000 mg by mouth 2 (two) times daily with a meal.   Yes [provider]  Multiple Vitamin (MULTIVITAMIN WITH MINERALS) TABS tablet Take 1 tablet by mouth at bedtime.   Yes [provider]  Multiple Vitamins-Minerals (ZINC PO) Take 1 tablet by mouth daily.   Yes [provider]  propranolol (INDERAL) 40 MG tablet Take 0.5 tablets (20 mg total) by mouth 2 (two) times daily. Patient taking differently: Take 40 mg by mouth 2 (two) times daily. 01/27/20  Yes Kathlen Mody, MD  rifaximin (XIFAXAN) 550 MG TABS tablet Take 1 tablet (550 mg total) by mouth 2 (two) times daily. Keep appointment for 02-11-24 for further refills. 01/06/24  Yes Selah Klang V, DO  feeding supplement, ENSURE ENLIVE, (ENSURE  ENLIVE) LIQD Take 237 mLs by mouth 2 (two) times daily between meals. Patient not taking: Reported on 03/17/2024 01/28/20   Kathlen Mody, MD  glipiZIDE (GLUCOTROL XL) 5 MG 24 hr tablet Take 10 mg by mouth 2 (two) times daily. Patient not taking: Reported on 03/17/2024 05/29/22   [provider]  methocarbamol (ROBAXIN) 500 MG tablet Take 1 tablet (500 mg total) by mouth 2 (two) times daily. 04/26/23   Gailen Shelter, PA  MOUNJARO 12.5 MG/0.5ML Pen Inject into the skin. 09/30/22   [provider]  MOUNJARO 15 MG/0.5ML Pen SMARTSIG:1 Unspecified SUB-Q Once a Week 10/12/22   [provider]    Current Outpatient Medications  Medication Sig Dispense Refill   cholecalciferol (VITAMIN D) 1000 units tablet Take 1,000 Units by mouth daily.      dapagliflozin propanediol (FARXIGA) 10 MG TABS tablet Take 10 mg by mouth at bedtime.     escitalopram (LEXAPRO) 10 MG tablet Take 10 mg by mouth daily.     ferrous sulfate (KP FERROUS SULFATE) 325 (65 FE) MG tablet Take 1 tablet (325  mg total) by mouth daily with breakfast. (Patient taking differently: Take 325 mg by mouth daily.) 30 tablet 3   glipiZIDE (GLUCOTROL XL) 10 MG 24 hr tablet Take 10 mg by mouth 2 (two) times daily.     lactulose (CHRONULAC) 10 GM/15ML solution Take 30 mLs (20 g total) by mouth 3 (three) times daily. 236 mL 0   levOCARNitine (CARNITOR) 330 MG tablet Take 330 mg by mouth 3 (three) times daily.     metFORMIN (GLUCOPHAGE) 1000 MG tablet Take 1,000 mg by mouth 2 (two) times daily with a meal.     Multiple Vitamin (MULTIVITAMIN WITH MINERALS) TABS tablet Take 1 tablet by mouth at bedtime.     Multiple Vitamins-Minerals (ZINC PO) Take 1 tablet by mouth daily.     propranolol (INDERAL) 40 MG tablet Take 0.5 tablets (20 mg total) by mouth 2 (two) times daily. (Patient taking differently: Take 40 mg by mouth 2 (two) times daily.) 60 tablet 0   rifaximin (XIFAXAN) 550 MG TABS tablet Take 1 tablet (550 mg total) by mouth  2 (two) times daily. Keep appointment for 02-11-24 for further refills. 180 tablet 0   feeding supplement, ENSURE ENLIVE, (ENSURE ENLIVE) LIQD Take 237 mLs by mouth 2 (two) times daily between meals. (Patient not taking: Reported on 03/17/2024) 237 mL 12   glipiZIDE (GLUCOTROL XL) 5 MG 24 hr tablet Take 10 mg by mouth 2 (two) times daily. (Patient not taking: Reported on 03/17/2024)     methocarbamol (ROBAXIN) 500 MG tablet Take 1 tablet (500 mg total) by mouth 2 (two) times daily. 20 tablet 0   MOUNJARO 12.5 MG/0.5ML Pen Inject into the skin.     MOUNJARO 15 MG/0.5ML Pen SMARTSIG:1 Unspecified SUB-Q Once a Week     Current Facility-Administered Medications  Medication Dose Route Frequency Provider Last Rate Last Admin   0.9 %  sodium chloride infusion  500 mL Intravenous Once Bravery Ketcham V, DO        Allergies as of 03/17/2024 - Review Complete 03/17/2024  Allergen Reaction Noted   Nadolol Itching and Nausea Only 10/06/2018    Family History  Problem Relation Age of Onset   Cirrhosis Mother    Esophageal cancer Father    Diabetes Other    Colon cancer Neg Hx    Rectal cancer Neg Hx    Stomach cancer Neg Hx     Social History   Socioeconomic History   Marital status: Married    Spouse name: Not on file   Number of children: 2   Years of education: Not on file   Highest education level: Not on file  Occupational History   Occupation: disabled  Tobacco Use   Smoking status: Never   Smokeless tobacco: Never  Vaping Use   Vaping status: Never Used  Substance and Sexual Activity   Alcohol use: Not Currently   Drug use: Never   Sexual activity: Not on file  Other Topics Concern   Not on file  Social History Narrative   Not on file   Social Drivers of Health   Financial Resource Strain: Low Risk  (09/04/2022)   [facility]   Overall Financial Resource Strain (CARDIA)    Difficulty of Paying Living Expenses: Not hard at all  Food  Insecurity: No Food Insecurity (09/04/2022)   [facility]   Hunger Vital Sign    Worried About Running Out of Food in the Last Year: Never  true    Ran Out of Food in the Last Year: Never true  Transportation Needs: No Transportation Needs (09/04/2022)   [facility]   PRAPARE - Transportation    Lack of Transportation (Medical): No    Lack of Transportation (Non-Medical): No  Physical Activity: Not on file  Stress: Not on file  Social Connections: Not on file  Intimate Partner Violence: Not on file    Physical Exam: Vital signs in last 24 hours: @BP  130/68   Pulse 75   Temp 98.4 F (36.9 C)   Resp 10   Ht 5\' 10"  (1.778 m)   Wt 221 lb (100.2 kg)   SpO2 100%   BMI 31.71 kg/m  GEN: NAD EYE: Sclerae anicteric ENT: MMM CV: Non-tachycardic Pulm: CTA b/l GI: Soft, NT/ND NEURO:  Alert & Oriented x 3   Doristine Locks, DO [facility]   03/17/2024 2:30 PM

## 2024-03-20 ENCOUNTER — Telehealth: Payer: Self-pay

## 2024-03-20 NOTE — Telephone Encounter (Signed)
  Follow up Call-     03/17/2024    1:23 PM 11/09/2022    7:44 AM  Call back number  Post procedure Call Back phone  # [phone]  Permission to leave phone message Yes Yes     Patient questions:  Do you have a fever, pain , or abdominal swelling? No. Pain Score  0 *  Have you tolerated food without any problems? Yes.    Have you been able to return to your normal activities? Yes.    Do you have any questions about your discharge instructions: Diet   No. Medications  No. Follow up visit  No.  Do you have questions or concerns about your Care? No.  Actions: * If pain score is 4 or above: No action needed, pain <4.

## 2024-03-22 ENCOUNTER — Encounter: Payer: Self-pay | Admitting: Gastroenterology

## 2024-03-22 LAB — SURGICAL PATHOLOGY

## 2024-04-07 ENCOUNTER — Ambulatory Visit
Admission: RE | Admit: 2024-04-07 | Discharge: 2024-04-07 | Disposition: A | Discharge status: Home / Self Care | Source: Ambulatory Visit | Attending: Nurse Practitioner | Admitting: Nurse Practitioner

## 2024-04-07 DIAGNOSIS — Z95828 Presence of other vascular implants and grafts: Secondary | ICD-10-CM

## 2024-04-07 DIAGNOSIS — K7460 Unspecified cirrhosis of liver: Secondary | ICD-10-CM

## 2024-04-07 DIAGNOSIS — K7682 Hepatic encephalopathy: Secondary | ICD-10-CM

## 2024-04-07 DIAGNOSIS — K766 Portal hypertension: Secondary | ICD-10-CM

## 2024-04-07 MED ORDER — IOPAMIDOL (ISOVUE-300) INJECTION 61%
500.0000 mL | Freq: Once | INTRAVENOUS | Status: AC | PRN
  Administered 2024-04-07: 100 mL via INTRAVENOUS

## 2024-09-20 ENCOUNTER — Other Ambulatory Visit (HOSPITAL_BASED_OUTPATIENT_CLINIC_OR_DEPARTMENT_OTHER): Payer: Self-pay | Admitting: Nurse Practitioner

## 2024-09-20 DIAGNOSIS — K7469 Other cirrhosis of liver: Secondary | ICD-10-CM

## 2024-09-28 ENCOUNTER — Ambulatory Visit (HOSPITAL_BASED_OUTPATIENT_CLINIC_OR_DEPARTMENT_OTHER)
Admission: RE | Admit: 2024-09-28 | Discharge: 2024-09-28 | Disposition: A | Discharge status: Home / Self Care | Source: Ambulatory Visit | Attending: Nurse Practitioner | Admitting: Nurse Practitioner

## 2024-09-28 DIAGNOSIS — K7469 Other cirrhosis of liver: Secondary | ICD-10-CM

## 2024-09-28 DIAGNOSIS — K7581 Nonalcoholic steatohepatitis (NASH): Secondary | ICD-10-CM | POA: Diagnosis not present

## 2024-11-21 ENCOUNTER — Telehealth: Payer: Self-pay

## 2024-11-21 NOTE — Telephone Encounter (Signed)
 Left message for patient to return call to our office to schedule 1 year follow up with Dr San or POD B apps.  Will continue efforts.

## 2024-11-22 NOTE — Telephone Encounter (Signed)
 Patient returned call. Patient was offered to make an appointment and denied at this time. Patient is requesting a call back to discuss issues with food getting stuck in his throat and wants to discuss with Nat. Please advise.

## 2024-11-23 NOTE — Telephone Encounter (Signed)
Incoming call from pt requesting a callback. Please advise.

## 2024-11-23 NOTE — Telephone Encounter (Signed)
 Patient returned call and stated he has been having issues with swallowing foods recently.  He feels that all foods are getting stuck in his throat.  He would like to be seen sooner than February for follow up.  Patient aware that he has been scheduled with Alan Coombs, PA-C on 12-12-24 at 8:20am.  Patient agreed to plan and verbalized understanding.  No further questions or concerns.

## 2024-11-23 NOTE — Telephone Encounter (Signed)
 Left message for patient to return call to our office to further discuss.

## 2024-12-11 NOTE — Progress Notes (Unsigned)
 "    12/12/2024 Martin Strong [id number] 02-Jan-1966  Referring provider: Silvano Angeline FALCON, NP Primary GI doctor: Dr. San  ASSESSMENT AND PLAN:  Dysphagia with food/pills, no regurgitation x 6 months EGD (10/2022): Well-healed post variceal banding scars in the lower esophagus with no residual varices.  Mild gastritis, normal duodenum Denies GERD, has been off PPI x 1-2 years Patient is on mounjaro  x 1-2 years 10 lb weight loss, denies melena Progressive esophageal dysphagia with weight loss. Differential includes stricture, motility disorder from LPR/GLP1, or sequelae of prior banding and inflammation. Further evaluation needed due to risk of food impaction.  - Scheduled EGD with possible dilation at [facility], I discussed risks of EGD with patient today, including risk of sedation, bleeding or perforation.  Patient provides understanding and gave verbal consent to proceed. - Instructed to chew food thoroughly, cut meats and foods into small pieces, and favor soft foods. - Advised to seek emergency care if food or water becomes stuck. - Restarted pantoprazole  (Protonix ). - Provided education regarding silent reflux and its atypical presentations.  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    Diabetes Discussed GLP1 with the patient, mechanism of action. Gastroparesis diet given to the patient.  Takes the injection on Sunday's Patient should be instructed to hold this medications if dose falls within 7 days of endoscopic procedure, due to increased risk of retained gastric contents.   Patient Care Team: Silvano Angeline FALCON, NP as PCP - General  HISTORY OF PRESENT ILLNESS: 58 y.o. male with a past medical history listed below presents for evaluation of dysphagia.   Last seen 02/11/2024 by Dr. San, follows with Stephane Quest at [facility] hepatology for NASH cirrhosis complicated with portal hypertension, varices/ascites status post TIPS 2021, SBP 2021, hepatic encephalopathy, sarcopenia.  On propranolol , Cipro .  Discussed the use of AI scribe software for clinical note transcription with the patient, who gave verbal consent to proceed.  History of Present Illness   Martin Strong is a 58 year old male with cirrhosis status post TIPS and type 2 diabetes who presents with progressive esophageal dysphagia.  Over the past 3 to 6 months, he has developed worsening difficulty swallowing, primarily with solid foods, but also with pills and, previously, liquids. Describes a sensation of food getting stuck from the onset of swallowing to the mid-esophagus, sometimes requiring water to facilitate passage. Occasionally, water or food regurgitates, sometimes with a gurgling sound. These episodes are  distressing, particularly in public, and sometimes necessitate excusing himself to the restroom due to a choking sensation. Symptoms began after his last endoscopy and  have progressively worsened. Food is most problematic, with pills also getting stuck; current issues with liquids are denied. No history of neck surgery or radiation.  Reports unintentional weight loss of approximately 10 pounds over recent months, from 215-218 lbs to 205-206 lbs. Denies nausea, vomiting, melena, or significant abdominal pain, though mild epigastric discomfort occurs occasionally. Frequent burping and occasional heartburn are noted, but he is not currently on reflux medication, having discontinued it 1 to 1.5 years ago after improvement. Denies ongoing reflux symptoms. No history of alcohol or tobacco use, and avoids NSAIDs due to cirrhosis.  Cirrhosis is managed with propranolol , lactulose , and Xifaxan . Last EGD in November 2023 showed scarring from prior variceal banding, no varices, and some gastric inflammation. Remote history of spontaneous bacterial peritonitis in 2021, no longer on antibiotic prophylaxis. Type 2 diabetes is managed with Mounjaro  for the past 1.5 to 2 years, without gastrointestinal side effects. Reports at least three bowel movements daily. Denies abdominal or leg swelling. Notes mild short-term memory lapses but no significant confusion. No chest pain and only mild exertional shortness of breath. Not on blood thinners.      Cirrhosis Hx: - Etiology: MASH/ETOH - Diagnosed 2017 - Liver bx: None - Complications: EV with admission for bleeding 09/2018 treated with EGD with EVL, large volume ascites serial LVP starting 09/2019 TIPS 02/2020 (revision 03/2020), SBP 01/2020, hepatic encephalopathy 04/2020, sarcopenia - Hepatic Rx: Lactulose , rifaximin  Off of : Lasix  20 mg/day, Aldactone  25 mg/day, Cipro  750 mg/week, levocarnitine  - Previous hepatic Rx: Propranolol  (dual prophy) and PPI discontinued due to SBP.  History of diuretic intolerance and AKI.  Follows with Nephrology, managing Lasix /Aldactone  - HCC Screening: UTD -03/13/2020: TIPS procedure -04/11/2020: TIPS  revision -05/09/2020: Patent TIPS with high velocity flow throughout - Annual lab check: Completed 08/2021 (except INR was not checked) - HAV and HBV vaccine series completed - MELD: 9 (fluctuates 9-14) - Child Pugh: A   Endoscopic history: - EGD x3 in 2017 in [place], [place] for esophageal varices with banding - EGD (09/2018, Dr. San): Grade 2 esophageal varices with stigmata of recent bleeding requiring banding x2 with complete eradication, portal hypertensive gastropathy without bleeding. - EGD (12/2018, Dr. San): Grade II esophageal varices, 6 bands placed with deflation. Normal Z line at 46 cm, severe portal HTN gastropathy with contact oozing, small GOV1, normal duodenum. H pylori serologies negative -EGD (02/2019, Dr. San): Grade 2 esophageal varices, 4 bands placed with complete eradication.  Portal hypertensive gastropathy/2 adenopathy -EGD (07/2019, Dr. San): Done for dysphagia. grade 1 esophageal varices, multiple post variceal banding scars in the lower third with 1 area of luminal deformity, likely consistent with prior deep banding site.  The lumen was mildly narrowed, but easily traversed and no additional endoscopic dilation performed.  Mild esophagitis at the GEJ, portal hypertensive gastropathy, peptic antritis/duodenitis.  Papilla prominent. - EGD (10/2022): Well-healed post variceal banding scars in the lower esophagus with no residual varices.  Mild gastritis, normal duodenum  He  reports that he has never smoked. He has never used smokeless tobacco. He reports that he does not currently use alcohol. He reports that he does not use drugs.  RELEVANT GI HISTORY, IMAGING AND LABS: Results   Diagnostic Esophagogastroduodenoscopy (EGD) (10/2022): Esophageal scarring from prior band ligation, absence of esophageal varices, gastric inflammation, otherwise unremarkable.      CBC  Component Value Date/Time   WBC 6.2 10/08/2023 0934   RBC 4.05 (L) 10/08/2023  0934   HGB 13.6 10/08/2023 0934   HCT 38.3 (L) 10/08/2023 0934   PLT 112 (L) 10/08/2023 0934   MCV 94.6 10/08/2023 0934   MCH 33.6 (H) 10/08/2023 0934   MCHC 35.5 10/08/2023 0934   RDW 14.5 10/08/2023 0934   LYMPHSABS 1.0 10/19/2020 0940   MONOABS 0.5 10/19/2020 0940   EOSABS 0.3 10/19/2020 0940   BASOSABS 0.1 10/19/2020 0940   No results for input(s): HGB in the last 8760 hours.  CMP     Component Value Date/Time   NA 143 10/08/2023 0934   K 4.1 10/08/2023 0934   CL 106 10/08/2023 0934   CO2 29 10/08/2023 0934   GLUCOSE 157 (H) 10/08/2023 0934   BUN 12 10/08/2023 0934   CREATININE 0.66 (L) 10/08/2023 0934   CALCIUM 9.2 10/08/2023 0934   PROT 7.1 10/08/2023 0934   ALBUMIN  3.0 (L) 10/21/2020 0241   AST 27 10/08/2023 0934   ALT 22 10/08/2023 0934   ALKPHOS 65 10/21/2020 0241   BILITOT 1.4 (H) 10/08/2023 0934   GFRNONAA 93 01/28/2021 0000   GFRAA 108 01/28/2021 0000      Latest Ref Rng & Units 10/08/2023    9:34 AM 07/06/2022    7:40 AM 01/28/2021   12:00 AM  Hepatic Function  Total Protein 6.1 - 8.1 g/dL 7.1  7.0  7.1   AST 10 - 35 U/L 27  29  31    ALT 9 - 46 U/L 22  25  30    Total Bilirubin 0.2 - 1.2 mg/dL 1.4  1.3  1.4       Latest Ref Rng & Units 07/06/2022    7:40 AM 10/08/2023    9:34 AM  Hepatitis C  AFP <6.1 ng/mL 0.9  1.2     Current Medications:   Current Outpatient Medications (Endocrine & Metabolic):    dapagliflozin propanediol (FARXIGA) 10 MG TABS tablet, Take 10 mg by mouth at bedtime.   glipiZIDE  (GLUCOTROL  XL) 10 MG 24 hr tablet, Take 10 mg by mouth 2 (two) times daily.   glipiZIDE  (GLUCOTROL  XL) 5 MG 24 hr tablet, Take 10 mg by mouth 2 (two) times daily.   JANUVIA 100 MG tablet, Take 100 mg by mouth daily.   levOCARNitine  (CARNITOR ) 330 MG tablet, Take 330 mg by mouth 3 (three) times daily.   metFORMIN (GLUCOPHAGE) 1000 MG tablet, Take 1,000 mg by mouth 2 (two) times daily with a meal.   MOUNJARO  15 MG/0.5ML Pen, SMARTSIG:1 Unspecified  SUB-Q Once a Week  Current Outpatient Medications (Cardiovascular):    propranolol  (INDERAL ) 40 MG tablet, Take 0.5 tablets (20 mg total) by mouth 2 (two) times daily. (Patient taking differently: Take 40 mg by mouth 2 (two) times daily.)  Current Outpatient Medications (Hematological):    ferrous sulfate  (KP FERROUS SULFATE ) 325 (65 FE) MG tablet, Take 1 tablet (325 mg total) by mouth daily with breakfast. (Patient taking differently: Take 325 mg by mouth daily.)  Current Outpatient Medications (Other):    cholecalciferol  (VITAMIN D ) 1000 units tablet, Take 1,000 Units by mouth daily.    escitalopram (LEXAPRO) 10 MG tablet, Take 10 mg by mouth daily.   feeding supplement, ENSURE ENLIVE, (ENSURE ENLIVE) LIQD, Take 237 mLs by mouth 2 (two) times daily between meals.   lactulose  (CHRONULAC ) 10 GM/15ML solution, Take 30 mLs (20 g total) by mouth 3 (three) times daily.   methocarbamol  (ROBAXIN )  500 MG tablet, Take 1 tablet (500 mg total) by mouth 2 (two) times daily.   Multiple Vitamin (MULTIVITAMIN WITH MINERALS) TABS tablet, Take 1 tablet by mouth at bedtime.   Multiple Vitamins-Minerals (ZINC  PO), Take 1 tablet by mouth daily.   pantoprazole  (PROTONIX ) 40 MG tablet, Take 1 tablet (40 mg total) by mouth daily.   rifaximin  (XIFAXAN ) 550 MG TABS tablet, Take 1 tablet (550 mg total) by mouth 2 (two) times daily. Keep appointment for 02-11-24 for further refills.  Current Facility-Administered Medications (Other):    0.9 %  sodium chloride  infusion  Medical History:  Past Medical History:  Diagnosis Date   Anemia    Arthritis    Blood transfusion without reported diagnosis    Cirrhosis (HCC)    Diabetes mellitus without complication (HCC)    Dyspnea    Fatty liver    GERD (gastroesophageal reflux disease)    GI bleed    Hyperlipidemia    Hypertension    Allergies: Allergies[1]   Surgical History:  He  has a past surgical history that includes esophageal bands;  Esophagogastroduodenoscopy (egd) with propofol  (N/A, 10/07/2018); esophageal banding (10/07/2018); Esophagogastroduodenoscopy (egd) with propofol  (N/A, 01/03/2019); esophageal banding (N/A, 01/03/2019); Esophagogastroduodenoscopy (egd) with propofol  (N/A, 02/14/2019); esophageal banding (02/14/2019); biopsy (02/14/2019); Esophagogastroduodenoscopy (Left, 07/22/2019); biopsy (07/22/2019); IR Paracentesis (10/05/2019); IR Paracentesis (10/24/2019); IR Paracentesis (11/16/2019); IR Radiologist Eval & Mgmt (12/19/2019); IR Paracentesis (12/25/2019); IR Paracentesis (01/05/2020); IR Paracentesis (01/23/2020); IR Paracentesis (02/08/2020); IR Paracentesis (02/16/2020); IR Radiologist Eval & Mgmt (02/27/2020); IR Paracentesis (02/28/2020); IR Paracentesis (03/12/2020); IR Tips (03/12/2020); Radiology with anesthesia (N/A, 03/12/2020); IR Paracentesis (04/04/2020); IR Radiologist Eval & Mgmt (04/09/2020); IR Paracentesis (04/11/2020); IR TIPS REVISION MOD SED (04/11/2020); IR Paracentesis (04/24/2020); IR Paracentesis (05/03/2020); IR Radiologist Eval & Mgmt (05/09/2020); IR Paracentesis (05/14/2020); IR Radiologist Eval & Mgmt (01/30/2021); IR Radiologist Eval & Mgmt (08/06/2021); IR Radiologist Eval & Mgmt (07/28/2022); IR Radiologist Eval & Mgmt (10/19/2023); Colonoscopy; and Upper gastrointestinal endoscopy. Family History:  His family history includes Cirrhosis in his mother; Diabetes in an other family member; Esophageal cancer in his father.  REVIEW OF SYSTEMS  : All other systems reviewed and negative except where noted in the History of Present Illness.  PHYSICAL EXAM: BP 132/68   Pulse 76   Ht 6' (1.829 m)   Wt 207 lb 6 oz (94.1 kg)   BMI 28.13 kg/m  Physical Exam   GENERAL APPEARANCE: Well nourished, in no apparent distress. HEENT: No cervical lymphadenopathy, unremarkable thyroid, sclerae anicteric, conjunctiva pink. RESPIRATORY: Respiratory effort normal, breath sounds equal bilaterally without  rales, rhonchi, or wheezing. Lungs clear to auscultation. CARDIO: Regular rate and rhythm with no murmurs, rubs, or gallops, peripheral pulses intact. ABDOMEN: Soft, non-distended, active bowel sounds in all four quadrants, non-tender to palpation, no rebound, no mass appreciated. RECTAL: Declines. MUSCULOSKELETAL: Full range of motion, normal gait, without edema. SKIN: Dry, intact without rashes or lesions. No jaundice. NEURO: Alert, oriented, no focal deficits. No evidence of hepatic encephalopathy. No asterixis observed. PSYCH: Cooperative, normal mood and affect.      Alan JONELLE Coombs, PA-C 8:37 AM      [1]  Allergies Allergen Reactions   Nadolol Itching and Nausea Only   "

## 2024-12-12 ENCOUNTER — Ambulatory Visit: Admitting: Physician Assistant

## 2024-12-12 ENCOUNTER — Encounter: Payer: Self-pay | Admitting: Physician Assistant

## 2024-12-12 VITALS — BP 132/68 | HR 76 | Ht 72.0 in | Wt 207.4 lb

## 2024-12-12 DIAGNOSIS — R1310 Dysphagia, unspecified: Secondary | ICD-10-CM | POA: Diagnosis not present

## 2024-12-12 DIAGNOSIS — Z7985 Long-term (current) use of injectable non-insulin antidiabetic drugs: Secondary | ICD-10-CM

## 2024-12-12 DIAGNOSIS — Z860101 Personal history of adenomatous and serrated colon polyps: Secondary | ICD-10-CM

## 2024-12-12 DIAGNOSIS — R1319 Other dysphagia: Secondary | ICD-10-CM

## 2024-12-12 DIAGNOSIS — K641 Second degree hemorrhoids: Secondary | ICD-10-CM | POA: Diagnosis not present

## 2024-12-12 DIAGNOSIS — K219 Gastro-esophageal reflux disease without esophagitis: Secondary | ICD-10-CM

## 2024-12-12 DIAGNOSIS — K7682 Hepatic encephalopathy: Secondary | ICD-10-CM

## 2024-12-12 DIAGNOSIS — K7460 Unspecified cirrhosis of liver: Secondary | ICD-10-CM | POA: Diagnosis not present

## 2024-12-12 DIAGNOSIS — K3189 Other diseases of stomach and duodenum: Secondary | ICD-10-CM

## 2024-12-12 DIAGNOSIS — K7581 Nonalcoholic steatohepatitis (NASH): Secondary | ICD-10-CM | POA: Diagnosis not present

## 2024-12-12 DIAGNOSIS — E119 Type 2 diabetes mellitus without complications: Secondary | ICD-10-CM | POA: Diagnosis not present

## 2024-12-12 DIAGNOSIS — Z860100 Personal history of colon polyps, unspecified: Secondary | ICD-10-CM

## 2024-12-12 MED ORDER — PANTOPRAZOLE SODIUM 40 MG PO TBEC: 40.0000 mg | DELAYED_RELEASE_TABLET | Freq: Every day | ORAL | 3 refills | Status: AC

## 2024-12-12 NOTE — Patient Instructions (Addendum)
 _______________________________________________________  If your blood pressure at your visit was 140/90 or greater, please contact your primary care physician to follow up on this.  _______________________________________________________  If you are age 57 or older, your body mass index should be between 23-30. Your Body mass index is 28.13 kg/m. If this is out of the aforementioned range listed, please consider follow up with your Primary Care Provider.  If you are age 85 or younger, your body mass index should be between 19-25. Your Body mass index is 28.13 kg/m. If this is out of the aformentioned range listed, please consider follow up with your Primary Care Provider.   ________________________________________________________  The [facility] providers would like to encourage you to use MYCHART to communicate with providers for non-urgent requests or questions.  Due to long hold times on the telephone, sending your provider a message by [facility] may be a faster and more efficient way to get a response.  Please allow 48 business hours for a response.  Please remember that this is for non-urgent requests.  _______________________________________________________  [facility] is using a team-based approach to care.  Your team is made up of your doctor and two to three APPS. Our APPS (Nurse Practitioners and Physician Assistants) work with your physician to ensure care continuity for you. They are fully qualified to address your health concerns and develop a treatment plan. They communicate directly with your gastroenterologist to care for you. Seeing the Advanced Practice Practitioners on your physician's team can help you by facilitating care more promptly, often allowing for earlier appointments, access to diagnostic testing, procedures, and other specialty referrals.   You have been scheduled for an endoscopy. Please follow written instructions given to you at your visit  today.  If you use inhalers (even only as needed), please bring them with you on the day of your procedure.  If you take any of the following medications, they will need to be adjusted prior to your procedure:   DO NOT TAKE 7 DAYS PRIOR TO TEST- Trulicity (dulaglutide) Ozempic, Wegovy (semaglutide) Mounjaro , Zepbound  (tirzepatide ) Bydureon Bcise (exanatide extended release)  DO NOT TAKE 1 DAY PRIOR TO YOUR TEST Rybelsus (semaglutide) Adlyxin (lixisenatide) Victoza (liraglutide) Byetta (exanatide) ___________________________________________________________________________  Silent reflux: Not all heartburn burns...SABRASABRASABRA  What is LPR? Laryngopharyngeal reflux (LPR) or silent reflux is a condition in which acid that is made in the stomach travels up the esophagus (swallowing tube) and gets to the throat. Not everyone with reflux has a lot of heartburn or indigestion. In fact, many people with LPR never have heartburn. This is why LPR is called SILENT REFLUX, and the terms Silent reflux and LPR are often used interchangeably. Because LPR is silent, it is sometimes difficult to diagnose.  How can you tell if you have LPR?  Chronic hoarseness- Some people have hoarseness that comes and goes throat clearing  Cough It can cause shortness of breath and cause asthma like symptoms. a feeling of a lump in the throat  difficulty swallowing a problem with too much nose and throat drainage.  Some people will feel their esophagus spasm which feels like their heart beating hard and fast, this will usually be after a meal, at rest, or lying down at night.    How do I treat this? Treatment for LPR should be individualized, and your doctor will suggest the best treatment for you. Generally there are several treatments for LPR: changing habits and diet to reduce reflux,  medications to reduce stomach acid, and  surgery to prevent reflux. Most people with LPR need to modify how and when they eat,  as well as take some medication, to get well. Sometimes, nonprescription liquid antacids, such as Maalox, Gelucil and Mylanta are recommended. When used, these antacids should be taken four times each day - one tablespoon one hour after each meal and before bedtime. Dietary and lifestyle changes alone are not often enough to control LPR - medications that reduce stomach acid are also usually needed. These must be prescribed by our doctor.   TIPS FOR REDUCING REFLUX AND LPR Control your LIFE-STYLE and your DIET! If you use tobacco, QUIT.  Smoking makes you reflux. After every cigarette you have some LPR.  Don't wear clothing that is too tight, especially around the waist (trousers, corsets, belts).  Do not lie down just after eating...in fact, do not eat within three hours of bedtime.  You should be on a low-fat diet.  Limit your intake of red meat.  Limit your intake of butter.  Avoid fried foods.  Avoid chocolate  Avoid cheese.  Avoid eggs. Specifically avoid caffeine (especially coffee and tea), soda pop (especially cola) and mints.  Avoid alcoholic beverages, particularly in the evening.  Understanding Your Weekly GLP-1 Injection  A helpful guide to managing common side effects  You are on a once-weekly injectable medication in the GLP-1 receptor agonist class. These medications can be very effective for blood sugar control, weight loss, and heart protection, fatty liver or OSA, but they can also come with some side effects that are important to understand. The good news is: most side effects can be managed with a few adjustments.  1. Gastroparesis-Like Symptoms These medications slow down your stomach to help you feel full longer -- great for weight loss and blood sugar control, but they can sometimes cause symptoms that feel like gastroparesis (slow stomach emptying). Symptoms may include: -Feeling full quickly when eating -Nausea or vomiting -Bloating or abdominal  discomfort -Worsening heartburn or reflux -Acid regurgitation -Stomach spasms or tightness What you can do: ??? Eat small, frequent meals (4-6 per day) ?? Drink fluids between meals, not during ?? Avoid high-fiber foods (like raw veggies or whole grains); cook your veggies well ?? Spread protein throughout the day (try Greek yogurt, eggs, soft meats, Glucerna, milk) ?? Choose soft foods you can mash with a fork ?? Switch to pured foods or liquids during flare-ups ?? Consider reading: Living Well with Gastroparesis by Camelia Bone ?? Downloadable Diet Guide: [facility] Gastroparesis Diet PDF  ?? Tip: Try following a gastroparesis-friendly diet on the day of your injection and the day after, when the medication's effect is strongest.  2. Constipation Since this medication slows down your digestive system, constipation is very common. Tips to help: ?? Drink plenty of water ???? Stay active with regular exercise ?? Add fiber-rich but gentle foods like kiwi ?? Try a low-dose magnesium supplement at night ?? Use MiraLAX (half to one capful daily) if constipation becomes more frequent, especially if your dose increases  If these strategies dont help, talk to your provider -- they may recommend or prescribe other treatments.  3. When to Call the Doctor or Go to the ER While rare, this medication can slightly increase your risk of serious conditions like: Pancreatitis (inflammation of the pancreas) Gallstones or gallbladder problems Watch for these signs and seek help if you experience: Severe abdominal pain (especially in the upper belly or that radiates to your back) Pain in the right upper  side of your abdomen Nausea, vomiting, fever, or chills that dont go away ?? Call your provider or go to the ER if these occur.  4. Who Should NOT Take This Medication? This medication should be avoided if you have: A personal history of pancreatitis  A personal or family history of  medullary thyroid cancer A condition called Multiple Endocrine Neoplasia Syndrome Type 2 (MEN2)  Final Note If the side effects are too bothersome, remember: Most symptoms will go away if you stop the medication. But many people tolerate it well after the first few weeks, especially with the right strategies in place.

## 2024-12-28 ENCOUNTER — Encounter: Payer: Self-pay | Admitting: Gastroenterology

## 2025-01-04 ENCOUNTER — Ambulatory Visit: Admitting: Gastroenterology

## 2025-01-04 ENCOUNTER — Encounter: Payer: Self-pay | Admitting: Gastroenterology

## 2025-01-04 VITALS — BP 137/79 | HR 73 | Temp 98.6°F | Resp 12 | Ht 72.0 in | Wt 207.0 lb

## 2025-01-04 DIAGNOSIS — K3189 Other diseases of stomach and duodenum: Secondary | ICD-10-CM

## 2025-01-04 DIAGNOSIS — Z9889 Other specified postprocedural states: Secondary | ICD-10-CM

## 2025-01-04 DIAGNOSIS — K766 Portal hypertension: Secondary | ICD-10-CM | POA: Diagnosis not present

## 2025-01-04 DIAGNOSIS — R1319 Other dysphagia: Secondary | ICD-10-CM

## 2025-01-04 DIAGNOSIS — K7460 Unspecified cirrhosis of liver: Secondary | ICD-10-CM

## 2025-01-04 DIAGNOSIS — K7469 Other cirrhosis of liver: Secondary | ICD-10-CM

## 2025-01-04 MED ORDER — SODIUM CHLORIDE 0.9 % IV SOLN: 500.0000 mL | INTRAVENOUS | Status: DC

## 2025-01-04 NOTE — Progress Notes (Signed)
 To pacu, VSS. Report to [email or web address]

## 2025-01-04 NOTE — Progress Notes (Signed)
 "   GASTROENTEROLOGY PROCEDURE H&P NOTE   Primary Care Physician: Silvano Angeline FALCON, NP    Reason for Procedure:   Dysphagia, weight loss, MASH cirrhosis  Plan:    EGD  Patient is appropriate for endoscopic procedure(s) in the ambulatory ([facility]) setting.  The nature of the procedure, as well as the risks, benefits, and alternatives were carefully and thoroughly reviewed with the patient. Ample time for discussion and questions allowed. The patient understood, was satisfied, and agreed to proceed. I personally addressed all patient questions and concerns.     HPI: Martin Strong is a 59 y.o. male who presents for EGD for evaluation of progressive solid food dysphagia and weight loss .  Patient was most recently seen in the Gastroenterology Clinic on 12/12/2024.  No interval change in medical history since that appointment. Please refer to that note for full details regarding GI history and clinical presentation.   Past Medical History:  Diagnosis Date   Anemia    Arthritis    Blood transfusion without reported diagnosis    Cirrhosis (HCC)    Diabetes mellitus without complication (HCC)    Dyspnea    Fatty liver    GERD (gastroesophageal reflux disease)    GI bleed    Hyperlipidemia    Hypertension     Past Surgical History:  Procedure Laterality Date   BIOPSY  02/14/2019   Procedure: BIOPSY;  Surgeon: San Sandor GAILS, DO;  Location: [facility];  Service: Gastroenterology;;   BIOPSY  07/22/2019   Procedure: BIOPSY;  Surgeon: San Sandor GAILS, DO;  Location: [facility];  Service: Gastroenterology;;   COLONOSCOPY     ESOPHAGEAL BANDING  10/07/2018   Procedure: ESOPHAGEAL BANDING;  Surgeon: San Sandor GAILS, DO;  Location: [facility];  Service: Gastroenterology;;   ESOPHAGEAL BANDING N/A 01/03/2019   Procedure: ESOPHAGEAL BANDING;  Surgeon: San Sandor GAILS, DO;  Location: [facility];  Service: Gastroenterology;  Laterality: N/A;   ESOPHAGEAL BANDING   02/14/2019   Procedure: ESOPHAGEAL BANDING;  Surgeon: San Sandor GAILS, DO;  Location: [facility];  Service: Gastroenterology;;   esophageal bands     ESOPHAGOGASTRODUODENOSCOPY Left 07/22/2019   Procedure: ESOPHAGOGASTRODUODENOSCOPY (EGD) WITH POSSIBLE BANDING;  Surgeon: San Sandor GAILS, DO;  Location: [facility];  Service: Gastroenterology;  Laterality: Left;   ESOPHAGOGASTRODUODENOSCOPY (EGD) WITH PROPOFOL  N/A 10/07/2018   Procedure: ESOPHAGOGASTRODUODENOSCOPY (EGD) WITH PROPOFOL ;  Surgeon: San Sandor GAILS, DO;  Location: [facility];  Service: Gastroenterology;  Laterality: N/A;   ESOPHAGOGASTRODUODENOSCOPY (EGD) WITH PROPOFOL  N/A 01/03/2019   Procedure: ESOPHAGOGASTRODUODENOSCOPY (EGD) WITH PROPOFOL ;  Surgeon: San Sandor GAILS, DO;  Location: [facility];  Service: Gastroenterology;  Laterality: N/A;   ESOPHAGOGASTRODUODENOSCOPY (EGD) WITH PROPOFOL  N/A 02/14/2019   Procedure: ESOPHAGOGASTRODUODENOSCOPY (EGD) WITH PROPOFOL ;  Surgeon: San Sandor GAILS, DO;  Location: [facility];  Service: Gastroenterology;  Laterality: N/A;   IR PARACENTESIS  10/05/2019   IR PARACENTESIS  10/24/2019   IR PARACENTESIS  11/16/2019   IR PARACENTESIS  12/25/2019   IR PARACENTESIS  01/05/2020   IR PARACENTESIS  01/23/2020   IR PARACENTESIS  02/08/2020   IR PARACENTESIS  02/16/2020   IR PARACENTESIS  02/28/2020   IR PARACENTESIS  03/12/2020   IR PARACENTESIS  04/04/2020   IR PARACENTESIS  04/11/2020   IR PARACENTESIS  04/24/2020   IR PARACENTESIS  05/03/2020   IR PARACENTESIS  05/14/2020   IR RADIOLOGIST EVAL & MGMT  12/19/2019   IR RADIOLOGIST EVAL & MGMT  02/27/2020   IR RADIOLOGIST EVAL &  MGMT  04/09/2020   IR RADIOLOGIST EVAL & MGMT  05/09/2020   IR RADIOLOGIST EVAL & MGMT  01/30/2021   IR RADIOLOGIST EVAL & MGMT  08/06/2021   IR RADIOLOGIST EVAL & MGMT  07/28/2022   IR RADIOLOGIST EVAL & MGMT  10/19/2023   IR TIPS  03/12/2020   IR TIPS REVISION MOD SED  04/11/2020   RADIOLOGY  WITH ANESTHESIA N/A 03/12/2020   Procedure: TIPS;  Surgeon: Karalee Beat, MD;  Location: [facility];  Service: Radiology;  Laterality: N/A;   UPPER GASTROINTESTINAL ENDOSCOPY      Prior to Admission medications  Medication Sig Start Date End Date Taking? Authorizing Provider  cholecalciferol  (VITAMIN D ) 1000 units tablet Take 1,000 Units by mouth daily.    Yes [provider]  dapagliflozin propanediol (FARXIGA) 10 MG TABS tablet Take 10 mg by mouth at bedtime.   Yes [provider]  escitalopram (LEXAPRO) 10 MG tablet Take 10 mg by mouth daily.   Yes [provider]  ferrous sulfate  (KP FERROUS SULFATE ) 325 (65 FE) MG tablet Take 1 tablet (325 mg total) by mouth daily with breakfast. Patient taking differently: Take 325 mg by mouth daily. 05/30/19  Yes Aliena Ghrist V, DO  glipiZIDE  (GLUCOTROL  XL) 10 MG 24 hr tablet Take 10 mg by mouth 2 (two) times daily.   Yes [provider]  JANUVIA 100 MG tablet Take 100 mg by mouth daily. 07/13/24  Yes [provider]  lactulose  (CHRONULAC ) 10 GM/15ML solution Take 30 mLs (20 g total) by mouth 3 (three) times daily. 03/13/20  Yes Almeda Sari Slocumb, PA-C  levOCARNitine  (CARNITOR ) 330 MG tablet Take 330 mg by mouth 3 (three) times daily.   Yes [provider]  metFORMIN (GLUCOPHAGE) 1000 MG tablet Take 1,000 mg by mouth 2 (two) times daily with a meal.   Yes [provider]  Multiple Vitamin (MULTIVITAMIN WITH MINERALS) TABS tablet Take 1 tablet by mouth at bedtime.   Yes [provider]  Multiple Vitamins-Minerals (ZINC  PO) Take 1 tablet by mouth daily.   Yes [provider]  pantoprazole  (PROTONIX ) 40 MG tablet Take 1 tablet (40 mg total) by mouth daily. 12/12/24  Yes Craig Alan SAUNDERS, PA-C  propranolol  (INDERAL ) 40 MG tablet Take 0.5 tablets (20 mg total) by mouth 2 (two) times daily. Patient taking differently: Take 40 mg by mouth 2 (two) times daily. 01/27/20  Yes  Akula, Vijaya, MD  rifaximin  (XIFAXAN ) 550 MG TABS tablet Take 1 tablet (550 mg total) by mouth 2 (two) times daily. Keep appointment for 02-11-24 for further refills. 01/06/24  Yes Jerrick Farve V, DO  feeding supplement, ENSURE ENLIVE, (ENSURE ENLIVE) LIQD Take 237 mLs by mouth 2 (two) times daily between meals. Patient not taking: Reported on 01/04/2025 01/28/20   Akula, Vijaya, MD  glipiZIDE  (GLUCOTROL  XL) 5 MG 24 hr tablet Take 10 mg by mouth 2 (two) times daily. Patient not taking: Reported on 01/04/2025 05/29/22   [provider]  methocarbamol  (ROBAXIN ) 500 MG tablet Take 1 tablet (500 mg total) by mouth 2 (two) times daily. Patient not taking: Reported on 01/04/2025 04/26/23   Neldon Hamp RAMAN, PA  MOUNJARO  15 MG/0.5ML Pen SMARTSIG:1 Unspecified SUB-Q Once a Week 10/12/22   [provider]    Current Outpatient Medications  Medication Sig Dispense Refill   cholecalciferol  (VITAMIN D ) 1000 units tablet Take 1,000 Units by mouth daily.      dapagliflozin propanediol (FARXIGA) 10 MG TABS tablet Take 10  mg by mouth at bedtime.     escitalopram (LEXAPRO) 10 MG tablet Take 10 mg by mouth daily.     ferrous sulfate  (KP FERROUS SULFATE ) 325 (65 FE) MG tablet Take 1 tablet (325 mg total) by mouth daily with breakfast. (Patient taking differently: Take 325 mg by mouth daily.) 30 tablet 3   glipiZIDE  (GLUCOTROL  XL) 10 MG 24 hr tablet Take 10 mg by mouth 2 (two) times daily.     JANUVIA 100 MG tablet Take 100 mg by mouth daily.     lactulose  (CHRONULAC ) 10 GM/15ML solution Take 30 mLs (20 g total) by mouth 3 (three) times daily. 236 mL 0   levOCARNitine  (CARNITOR ) 330 MG tablet Take 330 mg by mouth 3 (three) times daily.     metFORMIN (GLUCOPHAGE) 1000 MG tablet Take 1,000 mg by mouth 2 (two) times daily with a meal.     Multiple Vitamin (MULTIVITAMIN WITH MINERALS) TABS tablet Take 1 tablet by mouth at bedtime.     Multiple Vitamins-Minerals (ZINC  PO) Take 1 tablet by mouth daily.      pantoprazole  (PROTONIX ) 40 MG tablet Take 1 tablet (40 mg total) by mouth daily. 90 tablet 3   propranolol  (INDERAL ) 40 MG tablet Take 0.5 tablets (20 mg total) by mouth 2 (two) times daily. (Patient taking differently: Take 40 mg by mouth 2 (two) times daily.) 60 tablet 0   rifaximin  (XIFAXAN ) 550 MG TABS tablet Take 1 tablet (550 mg total) by mouth 2 (two) times daily. Keep appointment for 02-11-24 for further refills. 180 tablet 0   feeding supplement, ENSURE ENLIVE, (ENSURE ENLIVE) LIQD Take 237 mLs by mouth 2 (two) times daily between meals. (Patient not taking: Reported on 01/04/2025) 237 mL 12   glipiZIDE  (GLUCOTROL  XL) 5 MG 24 hr tablet Take 10 mg by mouth 2 (two) times daily. (Patient not taking: Reported on 01/04/2025)     methocarbamol  (ROBAXIN ) 500 MG tablet Take 1 tablet (500 mg total) by mouth 2 (two) times daily. (Patient not taking: Reported on 01/04/2025) 20 tablet 0   MOUNJARO  15 MG/0.5ML Pen SMARTSIG:1 Unspecified SUB-Q Once a Week     Current Facility-Administered Medications  Medication Dose Route Frequency Provider Last Rate Last Admin   0.9 %  sodium chloride  infusion  500 mL Intravenous Once Ilo Beamon V, DO       0.9 %  sodium chloride  infusion  500 mL Intravenous Continuous Ellyanna Holton V, DO        Allergies as of 01/04/2025 - Review Complete 01/04/2025  Allergen Reaction Noted   Nadolol Itching and Nausea Only 10/06/2018    Family History  Problem Relation Age of Onset   Cirrhosis Mother    Esophageal cancer Father    Diabetes Other    Colon cancer Neg Hx    Rectal cancer Neg Hx    Stomach cancer Neg Hx     Social History   Socioeconomic History   Marital status: Married    Spouse name: Not on file   Number of children: 2   Years of education: Not on file   Highest education level: Not on file  Occupational History   Occupation: disabled  Tobacco Use   Smoking status: Never   Smokeless tobacco: Never  Vaping Use   Vaping status: Never  Used  Substance and Sexual Activity   Alcohol use: Not Currently   Drug use: Never   Sexual activity: Not on file  Other Topics Concern   Not on  file  Social History Narrative   Not on file   Social Drivers of Health   Tobacco Use: Low Risk (01/04/2025)   Patient History    Smoking Tobacco Use: Never    Smokeless Tobacco Use: Never    Passive Exposure: Not on file  Financial Resource Strain: Low Risk (09/04/2022)   Received from [facility]   Overall Financial Resource Strain (CARDIA)    Difficulty of Paying Living Expenses: Not hard at all  Food Insecurity: Low Risk (09/20/2024)   Received from [facility]   Epic    Within the past 12 months, you worried that your food would run out before you got money to buy more: Never true    Within the past 12 months, the food you bought just didn't last and you didn't have money to get more. : Never true  Transportation Needs: No Transportation Needs (09/20/2024)   Received from [facility]    In the past 12 months, has lack of reliable transportation kept you from medical appointments, meetings, work or from getting things needed for daily living? : No  Physical Activity: Not on file  Stress: Not on file  Social Connections: Not on file  Intimate Partner Violence: Not on file  Depression (EYV7-0): Not on file  Alcohol Screen: Not on file  Housing: Low Risk (09/20/2024)   Received from [facility]   Epic    What is your living situation today?: I have a steady place to live    Think about the place you live. Do you have problems with any of the following? Choose all that apply:: None/None on this list  Utilities: Low Risk (09/20/2024)   Received from [facility]   Utilities    In the past 12 months has the electric, gas, oil, or water company threatened to shut off services in your home? : No  Health Literacy: Not on file    Physical Exam: Vital signs in last 24 hours: @BP  130/71   Pulse 72   Temp 98.6  F (37 C) (Temporal)   Ht 6' (1.829 m)   Wt 207 lb (93.9 kg)   SpO2 99%   BMI 28.07 kg/m  GEN: NAD EYE: Sclerae anicteric ENT: MMM CV: Non-tachycardic Pulm: CTA b/l GI: Soft, NT/ND NEURO:  Alert & Oriented x 3   Sandor Flatter, DO [facility]   01/04/2025 11:14 AM  "

## 2025-01-04 NOTE — Progress Notes (Signed)
 Pt's states no medical or surgical changes since previsit or office visit.

## 2025-01-04 NOTE — Patient Instructions (Signed)
 Resume previous diet. Continue present medications.  If continued dysphagia, can refer for barium esophagram.  YOU HAD AN ENDOSCOPIC PROCEDURE TODAY AT THE [facility]:   Refer to the procedure report that was given to you for any specific questions about what was found during the examination.  If the procedure report does not answer your questions, please call your gastroenterologist to clarify.  If you requested that your care partner not be given the details of your procedure findings, then the procedure report has been included in a sealed envelope for you to review at your convenience later.  YOU SHOULD EXPECT: Some feelings of bloating in the abdomen. Passage of more gas than usual.  Walking can help get rid of the air that was put into your GI tract during the procedure and reduce the bloating. If you had a lower endoscopy (such as a colonoscopy or flexible sigmoidoscopy) you may notice spotting of blood in your stool or on the toilet paper. If you underwent a bowel prep for your procedure, you may not have a normal bowel movement for a few days.  Please Note:  You might notice some irritation and congestion in your nose or some drainage.  This is from the oxygen used during your procedure.  There is no need for concern and it should clear up in a day or so.  SYMPTOMS TO REPORT IMMEDIATELY:  Following upper endoscopy (EGD)  Vomiting of blood or coffee ground material  New chest pain or pain under the shoulder blades  Painful or persistently difficult swallowing  New shortness of breath  Fever of 100F or higher  Black, tarry-looking stools  For urgent or emergent issues, a gastroenterologist can be reached at any hour by calling [phone]. Do not use MyChart messaging for urgent concerns.    DIET:  We do recommend a small meal at first, but then you may proceed to your regular diet.  Drink plenty of fluids but you should avoid alcoholic beverages for 24  hours.  ACTIVITY:  You should plan to take it easy for the rest of today and you should NOT DRIVE or use heavy machinery until tomorrow (because of the sedation medicines used during the test).    FOLLOW UP: Our staff will call the number listed on your records the next business day following your procedure.  We will call around 7:15- 8:00 am to check on you and address any questions or concerns that you may have regarding the information given to you following your procedure. If we do not reach you, we will leave a message.     If any biopsies were taken you will be contacted by phone or by letter within the next 1-3 weeks.  Please call us  at [phone] if you have not heard about the biopsies in 3 weeks.    SIGNATURES/CONFIDENTIALITY: You and/or your care partner have signed paperwork which will be entered into your electronic medical record.  These signatures attest to the fact that that the information above on your After Visit Summary has been reviewed and is understood.  Full responsibility of the confidentiality of this discharge information lies with you and/or your care-partner.

## 2025-01-04 NOTE — Op Note (Signed)
 [facility] Patient Name: Martin Strong Procedure Date: 01/04/2025 11:03 AM MRN: [id number] Endoscopist: Sandor Flatter , MD, [id number] Age: 59 Referring MD:  Date of Birth: Oct 26, 1966 Gender: Male Account #: [id number] Procedure:                Upper GI endoscopy Indications:              Dysphagia Medicines:                Monitored Anesthesia Care Procedure:                Pre-Anesthesia Assessment:                           - Prior to the procedure, a History and Physical                            was performed, and patient medications and                            allergies were reviewed. The patient's tolerance of                            previous anesthesia was also reviewed. The risks                            and benefits of the procedure and the sedation                            options and risks were discussed with the patient.                            All questions were answered, and informed consent                            was obtained. Prior Anticoagulants: The patient has                            taken no anticoagulant or antiplatelet agents. ASA                            Grade Assessment: III - A patient with severe                            systemic disease. After reviewing the risks and                            benefits, the patient was deemed in satisfactory                            condition to undergo the procedure.                           After obtaining informed consent, the endoscope was  passed under direct vision. Throughout the                            procedure, the patient's blood pressure, pulse, and                            oxygen saturations were monitored continuously. The                            Olympus Scope SN [id number] was introduced through the                            mouth, and advanced to the second part of duodenum.                            The upper GI endoscopy was  accomplished without                            difficulty. The patient tolerated the procedure                            well. Scope In: Scope Out: Findings:                 A few small post variceal banding scars were found                            in the lower third of the esophagus. The scar                            tissue was healthy in appearance. The esophagus was                            otherwise normal appearing. No luminal narrowing or                            strictures noted.                           The Z-line was regular and was found 44 cm from the                            incisors.                           Mild portal hypertensive gastropathy was found in                            the gastric fundus, in the gastric body and in the                            gastric antrum. This was previously biopsied and  benign and therefore not biopsied again today.                           The examined duodenum was normal. Complications:            No immediate complications. Estimated Blood Loss:     Estimated blood loss: none. Impression:               - Well-healed post banding scars in the lower third                            of the esophagus.                           - Z-line regular, 44 cm from the incisors.                           - The esophagus was otherwise normal appearing. No                            luminal narrowing or strictures noted.                           - Portal hypertensive gastropathy.                           - Normal examined duodenum.                           - No specimens collected. Recommendation:           - Patient has a contact number available for                            emergencies. The signs and symptoms of potential                            delayed complications were discussed with the                            patient. Return to normal activities tomorrow.                             Written discharge instructions were provided to the                            patient.                           - Resume previous diet.                           - Continue present medications.                           - If continued dysphagia, can refer for barium  esophagram. Sandor Flatter, MD 01/04/2025 11:31:38 AM

## 2025-01-05 ENCOUNTER — Telehealth: Payer: Self-pay

## 2025-01-05 NOTE — Telephone Encounter (Signed)
" °  Follow up Call-     01/04/2025    9:40 AM 03/17/2024    1:23 PM 11/09/2022    7:44 AM  Call back number  Post procedure Call Back phone  # [phone]  Permission to leave phone message Yes Yes Yes     Patient questions:  Do you have a fever, pain , or abdominal swelling? No. Pain Score  0 *  Have you tolerated food without any problems? Yes.    Have you been able to return to your normal activities? Yes.    Do you have any questions about your discharge instructions: Diet   No. Medications  No. Follow up visit  No.  Do you have questions or concerns about your Care? No.  Actions: * If pain score is 4 or above: No action needed, pain <4.  "
